# Patient Record
Sex: Male | Born: 1935 | Race: White | Hispanic: No | Marital: Married | Smoking: Former smoker
Health system: Southern US, Community
[De-identification: ages and names within clinical notes are randomized; demographics above are authoritative.]

## PROBLEM LIST (undated history)

## (undated) DIAGNOSIS — R51 Headache: Secondary | ICD-10-CM

## (undated) DIAGNOSIS — J449 Chronic obstructive pulmonary disease, unspecified: Secondary | ICD-10-CM

## (undated) DIAGNOSIS — E119 Type 2 diabetes mellitus without complications: Secondary | ICD-10-CM

## (undated) DIAGNOSIS — J189 Pneumonia, unspecified organism: Secondary | ICD-10-CM

## (undated) DIAGNOSIS — Z87442 Personal history of urinary calculi: Secondary | ICD-10-CM

## (undated) DIAGNOSIS — D649 Anemia, unspecified: Secondary | ICD-10-CM

## (undated) DIAGNOSIS — H9190 Unspecified hearing loss, unspecified ear: Secondary | ICD-10-CM

## (undated) DIAGNOSIS — I10 Essential (primary) hypertension: Secondary | ICD-10-CM

## (undated) DIAGNOSIS — M1990 Unspecified osteoarthritis, unspecified site: Secondary | ICD-10-CM

## (undated) DIAGNOSIS — R519 Headache, unspecified: Secondary | ICD-10-CM

## (undated) DIAGNOSIS — E7800 Pure hypercholesterolemia, unspecified: Secondary | ICD-10-CM

## (undated) HISTORY — PX: FRACTURE SURGERY: SHX138

## (undated) HISTORY — PX: ANKLE FRACTURE SURGERY: SHX122

## (undated) HISTORY — PX: CATARACT EXTRACTION: SUR2

---

## 1998-08-24 ENCOUNTER — Ambulatory Visit (HOSPITAL_COMMUNITY): Admission: RE | Admit: 1998-08-24 | Discharge: 1998-08-24 | Payer: Self-pay | Admitting: *Deleted

## 1998-09-22 ENCOUNTER — Emergency Department (HOSPITAL_COMMUNITY): Admission: EM | Admit: 1998-09-22 | Discharge: 1998-09-22 | Payer: Self-pay | Admitting: Emergency Medicine

## 2001-11-24 ENCOUNTER — Encounter: Payer: Self-pay | Admitting: Emergency Medicine

## 2001-11-24 ENCOUNTER — Emergency Department (HOSPITAL_COMMUNITY): Admission: EM | Admit: 2001-11-24 | Discharge: 2001-11-24 | Payer: Self-pay | Admitting: Emergency Medicine

## 2004-07-26 ENCOUNTER — Encounter: Admission: RE | Admit: 2004-07-26 | Discharge: 2004-07-26 | Payer: Self-pay | Admitting: Cardiology

## 2004-08-24 ENCOUNTER — Ambulatory Visit (HOSPITAL_COMMUNITY): Admission: RE | Admit: 2004-08-24 | Discharge: 2004-08-24 | Payer: Self-pay | Admitting: Cardiology

## 2005-09-10 ENCOUNTER — Emergency Department (HOSPITAL_COMMUNITY): Admission: EM | Admit: 2005-09-10 | Discharge: 2005-09-11 | Payer: Self-pay | Admitting: Emergency Medicine

## 2005-10-03 ENCOUNTER — Encounter: Admission: RE | Admit: 2005-10-03 | Discharge: 2005-10-03 | Payer: Self-pay | Admitting: Gastroenterology

## 2006-04-10 ENCOUNTER — Encounter: Payer: Self-pay | Admitting: Emergency Medicine

## 2008-02-03 ENCOUNTER — Encounter: Admission: RE | Admit: 2008-02-03 | Discharge: 2008-02-23 | Payer: Self-pay | Admitting: Orthopedic Surgery

## 2011-04-27 NOTE — Cardiovascular Report (Signed)
NAME:  Joshua [name], Joshua [name] NO.:  [id number]   MEDICAL RECORD NO.:  [id number]                   PATIENT TYPE:  OIB   LOCATION:  2899                                 FACILITY:  [facility]   PHYSICIAN:  W. Ashley Royalty., M.D.         DATE OF BIRTH:  May 02, 1936   DATE OF PROCEDURE:  08/24/2004  DATE OF DISCHARGE:                              CARDIAC CATHETERIZATION   HISTORY:  The patient is a 74 year old male who developed diaphoresis and  sweating and shortness of breath and had a Cardiolite test showing a small  focused anterolateral ischemia.  He is brought in at this time for  diagnostic catheterization.   PROCEDURE:  Left heart catheterization with coronary angiogram and left  ventriculogram.   COMMENTS ABOUT PROCEDURE:  The patient tolerated the procedure well.  Following the procedure, he had good hemostasis and peripheral pulses noted.   HEMODYNAMIC DATA:  1.  Aorta postcontrast:  149/71.  2.  LV postcontrast:  149/11-21.   ANGIOGRAPHIC DATA:   LEFT VENTRICULOGRAM:  Performed in the 30 degree RAO projection.  The aortic  valve was normal.  The mitral valve appeared normal.  There was trace to  mild mitral regurgitation that may by catheter induced.  The estimated  ejection fraction is 60%.  There is no wall motion abnormality.  Coronary  arteries arise and distribute normally.  Left main coronary artery is  normal.   Left anterior descending:  Large vessel that extends around the apex.  Supplies a diagonal branch.  Scattered irregularities are present, but no  significant stenoses are noted.   Circumflex coronary artery:  Scattered irregularities.   Right coronary artery:  Supplies the posterior descending and a posterior  lateral branch and is free of disease.   IMPRESSION:  1.  Normal left ventricular function.  2.  Scattered irregularities, but no significant obstructive coronary artery      disease noted.  3.  Trace to mild mitral  regurgitation that may be catheter induced.                                               Darden Palmer., M.D.    WST/MEDQ  D:  08/24/2004  T:  08/24/2004  Job:  [id number]   cc:   Ralene Ok, M.D.  [address]  [place]  [place] [address]  Fax: [phone]   Aida Puffer  [address]  [place]  [place] [address]  Fax: [phone]

## 2012-07-14 ENCOUNTER — Other Ambulatory Visit: Payer: Self-pay | Admitting: Orthopedic Surgery

## 2012-07-14 ENCOUNTER — Ambulatory Visit
Admission: RE | Admit: 2012-07-14 | Discharge: 2012-07-14 | Disposition: A | Discharge status: Home / Self Care | Payer: Medicare Other | Source: Ambulatory Visit | Attending: Orthopedic Surgery | Admitting: Orthopedic Surgery

## 2012-07-14 DIAGNOSIS — IMO0002 Reserved for concepts with insufficient information to code with codable children: Secondary | ICD-10-CM

## 2014-07-02 ENCOUNTER — Other Ambulatory Visit: Payer: Self-pay | Admitting: Family Medicine

## 2014-07-02 DIAGNOSIS — Z87891 Personal history of nicotine dependence: Secondary | ICD-10-CM

## 2014-07-08 ENCOUNTER — Other Ambulatory Visit: Payer: Medicare Other

## 2014-07-13 ENCOUNTER — Ambulatory Visit
Admission: RE | Admit: 2014-07-13 | Discharge: 2014-07-13 | Disposition: A | Discharge status: Home / Self Care | Payer: Medicare Other | Source: Ambulatory Visit | Attending: Family Medicine | Admitting: Family Medicine

## 2014-07-13 DIAGNOSIS — Z87891 Personal history of nicotine dependence: Secondary | ICD-10-CM

## 2016-11-29 ENCOUNTER — Ambulatory Visit (INDEPENDENT_AMBULATORY_CARE_PROVIDER_SITE_OTHER): Payer: Medicare Other | Admitting: Orthopedic Surgery

## 2016-11-29 ENCOUNTER — Ambulatory Visit (INDEPENDENT_AMBULATORY_CARE_PROVIDER_SITE_OTHER): Payer: Medicare Other

## 2016-11-29 ENCOUNTER — Encounter (INDEPENDENT_AMBULATORY_CARE_PROVIDER_SITE_OTHER): Payer: Self-pay | Admitting: Orthopedic Surgery

## 2016-11-29 VITALS — Ht 72.0 in | Wt 185.0 lb

## 2016-11-29 DIAGNOSIS — M25561 Pain in right knee: Secondary | ICD-10-CM

## 2016-11-29 DIAGNOSIS — M170 Bilateral primary osteoarthritis of knee: Secondary | ICD-10-CM

## 2016-11-29 DIAGNOSIS — G8929 Other chronic pain: Secondary | ICD-10-CM

## 2016-11-29 DIAGNOSIS — M25562 Pain in left knee: Secondary | ICD-10-CM | POA: Diagnosis not present

## 2016-11-29 MED ORDER — LIDOCAINE HCL 1 % IJ SOLN
5.0000 mL | INTRAMUSCULAR | Status: AC | PRN
  Administered 2016-11-29: 5 mL

## 2016-11-29 MED ORDER — METHYLPREDNISOLONE ACETATE 40 MG/ML IJ SUSP
40.0000 mg | INTRAMUSCULAR | Status: AC | PRN
  Administered 2016-11-29: 40 mg via INTRA_ARTICULAR

## 2016-11-29 NOTE — Progress Notes (Signed)
Office Visit Note   Patient: Joshua MannanLarry D Shima           Date of Birth: 1936-04-30           MRN: [id number] Visit Date: 11/29/2016              Requested by: No referring provider defined for this encounter. PCP: No primary care provider on file.   Assessment & Plan: Visit Diagnoses:  1. Bilateral primary osteoarthritis of knee   2. Chronic pain of left knee   3. Chronic pain of right knee     Plan: Right knee was injected. Discussed the due to failure of conservative care we would recommend proceeding with total knee arthroplasty in the right preoperative postoperative protocol was discussed. Patient states he understands he will discuss this with his family.  Follow-Up Instructions: Return if symptoms worsen or fail to improve.   Orders:  Orders Placed This Encounter  Procedures  . XR Knee 1-2 Views Right  . XR Knee 1-2 Views Left   No orders of the defined types were placed in this encounter.     Procedures: Large Joint Inj Date/Time: 11/29/2016 2:14 PM Performed by: Alyza Artiaga V Authorized by: Nadara MustardUDA, Tyeshia Cornforth V   Consent Given by:  Patient Site marked: the procedure site was marked   Timeout: prior to procedure the correct patient, procedure, and site was verified   Indications:  Pain and diagnostic evaluation Location:  Knee Site:  R knee Prep: patient was prepped and draped in usual sterile fashion   Needle Size:  22 G Needle Length:  1.5 inches Approach:  Anteromedial Ultrasound Guidance: No   Fluoroscopic Guidance: No   Arthrogram: No   Medications:  5 mL lidocaine 1 %; 40 mg methylPREDNISolone acetate 40 MG/ML Aspiration Attempted: No   Patient tolerance:  Patient tolerated the procedure well with no immediate complications     Clinical Data: No additional findings.   Subjective: Chief Complaint  Patient presents with  . Left Knee - Pain  . Right Knee - Pain    Bilateral knee pain. Medial side pain pt does not note any swelling. Knees have  been painful for the past 15 years per the pt's wife. He does not want to have surgery. He has been to flexogenics x 5 and it was only helpful for a few days. The pt takes tramadol, goodies power, mobic prn pain. "nothing helps" Painful ambulation.    Review of Systems of note patient is been to flex a generous without relief he is currently on motor. He states he quit smoking 5 months ago with a lifetime history of tobacco use.   Objective: Vital Signs: Ht 6' (1.829 m)   Wt 185 lb (83.9 kg)   BMI 25.09 kg/m   Physical Exam patient is alert oriented no adenopathy well-dressed normal affect  respiratory effort with forced expiratory breathing. he does have an antalgic gait with varus alignment worse on the right than the left. Examination patient is crepitation range of motion both knees Clausen cruciate are stable.  Shows severe tricompartmental arthritic changes.  Ortho Exam  Specialty Comments:  No specialty comments available.  Imaging: Xr Knee 1-2 Views Left  Result Date: 11/29/2016 Two-view radiographs of the left knee shows bone-on-bone contact of all 3 compartments periarticular bony spurs with calcification of the popliteal vessels.  Xr Knee 1-2 Views Right  Result Date: 11/29/2016 Two-view radiographs of the left knee shows tricompartmental arthritic changes bone-on-bone contact periarticular bony  spurs with calcification of the popliteal vessels. Patient has varus alignment of the right knee worse than the left.    PMFS History: There are no active problems to display for this patient.  No past medical history on file.  No family history on file.  No past surgical history on file. Social History   Occupational History  . Not on file.   Social History Main Topics  . Smoking status: Current Every Day Smoker  . Smokeless tobacco: Never Used  . Alcohol use No  . Drug use: Unknown  . Sexual activity: Not on file

## 2017-01-28 ENCOUNTER — Encounter (HOSPITAL_COMMUNITY): Payer: Self-pay

## 2017-01-28 ENCOUNTER — Emergency Department (HOSPITAL_COMMUNITY): Payer: Medicare Other

## 2017-01-28 ENCOUNTER — Emergency Department (HOSPITAL_COMMUNITY)
Admission: EM | Admit: 2017-01-28 | Discharge: 2017-01-28 | Disposition: A | Discharge status: Home / Self Care | Payer: Medicare Other | Attending: Emergency Medicine | Admitting: Emergency Medicine

## 2017-01-28 DIAGNOSIS — E119 Type 2 diabetes mellitus without complications: Secondary | ICD-10-CM | POA: Insufficient documentation

## 2017-01-28 DIAGNOSIS — Z7984 Long term (current) use of oral hypoglycemic drugs: Secondary | ICD-10-CM | POA: Diagnosis not present

## 2017-01-28 DIAGNOSIS — I10 Essential (primary) hypertension: Secondary | ICD-10-CM | POA: Insufficient documentation

## 2017-01-28 DIAGNOSIS — R1084 Generalized abdominal pain: Secondary | ICD-10-CM | POA: Diagnosis not present

## 2017-01-28 DIAGNOSIS — R112 Nausea with vomiting, unspecified: Secondary | ICD-10-CM | POA: Diagnosis present

## 2017-01-28 DIAGNOSIS — Z87891 Personal history of nicotine dependence: Secondary | ICD-10-CM | POA: Diagnosis not present

## 2017-01-28 DIAGNOSIS — R197 Diarrhea, unspecified: Secondary | ICD-10-CM | POA: Diagnosis not present

## 2017-01-28 DIAGNOSIS — Z7982 Long term (current) use of aspirin: Secondary | ICD-10-CM | POA: Insufficient documentation

## 2017-01-28 DIAGNOSIS — Z79899 Other long term (current) drug therapy: Secondary | ICD-10-CM | POA: Diagnosis not present

## 2017-01-28 HISTORY — DX: Pure hypercholesterolemia, unspecified: E78.00

## 2017-01-28 HISTORY — DX: Essential (primary) hypertension: I10

## 2017-01-28 HISTORY — DX: Type 2 diabetes mellitus without complications: E11.9

## 2017-01-28 LAB — COMPREHENSIVE METABOLIC PANEL
ALT: 18 U/L (ref 17–63)
AST: 17 U/L (ref 15–41)
Albumin: 3.4 g/dL — ABNORMAL LOW (ref 3.5–5.0)
Alkaline Phosphatase: 64 U/L (ref 38–126)
Anion gap: 11 (ref 5–15)
BUN: 21 mg/dL — ABNORMAL HIGH (ref 6–20)
CO2: 21 mmol/L — AB (ref 22–32)
CREATININE: 1.26 mg/dL — AB (ref 0.61–1.24)
Calcium: 9.1 mg/dL (ref 8.9–10.3)
Chloride: 109 mmol/L (ref 101–111)
GFR calc Af Amer: >60 mL/min (ref >60)
GFR calc non Af Amer: 53 mL/min — ABNORMAL LOW (ref >60)
Glucose, Bld: 184 mg/dL — ABNORMAL HIGH (ref 65–99)
POTASSIUM: 4.2 mmol/L (ref 3.5–5.1)
Sodium: 141 mmol/L (ref 135–145)
Total Bilirubin: 0.6 mg/dL (ref 0.3–1.2)
Total Protein: 6.4 g/dL — ABNORMAL LOW (ref 6.5–8.1)

## 2017-01-28 LAB — CBC
HCT: 41.8 % (ref 39.0–52.0)
HEMOGLOBIN: 13.9 g/dL (ref 13.0–17.0)
MCH: 31.3 pg (ref 26.0–34.0)
MCHC: 33.3 g/dL (ref 30.0–36.0)
MCV: 94.1 fL (ref 78.0–100.0)
PLATELETS: 243 10*3/uL (ref 150–400)
RBC: 4.44 MIL/uL (ref 4.22–5.81)
RDW: 15 % (ref 11.5–15.5)
WBC: 9.9 10*3/uL (ref 4.0–10.5)

## 2017-01-28 LAB — TYPE AND SCREEN
ABO/RH(D): A NEG
Antibody Screen: POSITIVE
DAT, IGG: NEGATIVE

## 2017-01-28 MED ORDER — OMEPRAZOLE 20 MG PO CPDR: 20.0000 mg | DELAYED_RELEASE_CAPSULE | Freq: Two times a day (BID) | ORAL | 0 refills | Status: DC

## 2017-01-28 MED ORDER — PANTOPRAZOLE SODIUM 40 MG IV SOLR
40.0000 mg | Freq: Once | INTRAVENOUS | Status: AC
Start: 2017-01-28 — End: 2017-01-28
  Administered 2017-01-28: 40 mg via INTRAVENOUS
  Filled 2017-01-28: qty 40

## 2017-01-28 MED ORDER — ONDANSETRON 4 MG PO TBDP: 4.0000 mg | ORAL_TABLET | Freq: Three times a day (TID) | ORAL | 0 refills | Status: DC | PRN

## 2017-01-28 MED ORDER — IOPAMIDOL (ISOVUE-300) INJECTION 61%
INTRAVENOUS | Status: AC
  Administered 2017-01-28: 75 mL
  Filled 2017-01-28: qty 100

## 2017-01-28 NOTE — ED Notes (Signed)
Patient verbalized understanding of discharge instructions and denies any further needs or questions at this time. VS stable. Patient ambulatory with steady gait. Escorted to ED entrance in wheelchair.   

## 2017-01-28 NOTE — ED Notes (Signed)
Blood Bank called and stated pt has rare antibody, so if he is to have a blood transfusion, it would take an hour to prepare blood.

## 2017-01-28 NOTE — ED Notes (Signed)
MD at bedside. 

## 2017-01-28 NOTE — ED Provider Notes (Signed)
7:45 PM patient asymptomatic alert Glasgow Coma Score 15 able to drink water without difficulty. Results for orders placed or performed during the hospital encounter of 01/28/17  Comprehensive metabolic panel  Result Value Ref Range   Sodium 141 135 - 145 mmol/L   Potassium 4.2 3.5 - 5.1 mmol/L   Chloride 109 101 - 111 mmol/L   CO2 21 (L) 22 - 32 mmol/L   Glucose, Bld 184 (H) 65 - 99 mg/dL   BUN 21 (H) 6 - 20 mg/dL   Creatinine, Ser 1.611.26 (H) 0.61 - 1.24 mg/dL   Calcium 9.1 8.9 - 09.610.3 mg/dL   Total Protein 6.4 (L) 6.5 - 8.1 g/dL   Albumin 3.4 (L) 3.5 - 5.0 g/dL   AST 17 15 - 41 U/L   ALT 18 17 - 63 U/L   Alkaline Phosphatase 64 38 - 126 U/L   Total Bilirubin 0.6 0.3 - 1.2 mg/dL   GFR calc non Af Amer 53 (L) >60 mL/min   GFR calc Af Amer >60 >60 mL/min   Anion gap 11 5 - 15  CBC  Result Value Ref Range   WBC 9.9 4.0 - 10.5 K/uL   RBC 4.44 4.22 - 5.81 MIL/uL   Hemoglobin 13.9 13.0 - 17.0 g/dL   HCT 04.541.8 40.939.0 - 81.152.0 %   MCV 94.1 78.0 - 100.0 fL   MCH 31.3 26.0 - 34.0 pg   MCHC 33.3 30.0 - 36.0 g/dL   RDW 91.415.0 78.211.5 - 95.615.5 %   Platelets 243 150 - 400 K/uL  Type and screen [facility]  Result Value Ref Range   ABO/RH(D) A NEG    Antibody Screen POS    Sample Expiration 01/31/2017    Antibody Identification ANTI D    DAT, IgG NEG    Ct Abdomen Pelvis W Contrast  Result Date: 01/28/2017 CLINICAL DATA:  Mid abdominal and back pain for 2 days. Coffee ground emesis and tarry stools. EXAM: CT ABDOMEN AND PELVIS WITH CONTRAST TECHNIQUE: Multidetector CT imaging of the abdomen and pelvis was performed using the standard protocol following bolus administration of intravenous contrast. CONTRAST:  75 ml ISOVUE-300 IOPAMIDOL (ISOVUE-300) INJECTION 61% COMPARISON:  CT chest 07/13/2014. FINDINGS: Lower chest: Calcific aortic and coronary atherosclerosis is identified. No pleural or pericardial effusion. Basilar atelectasis is worse on the left. Hepatobiliary: A 0.5 cm  hypoattenuating lesion in the right hepatic lobe is likely a cyst. The liver otherwise appears normal. The gallbladder and biliary tree are unremarkable. Pancreas: Unremarkable. No pancreatic ductal dilatation or surrounding inflammatory changes. Spleen: Normal in size without focal abnormality. Adrenals/Urinary Tract: Lobulated kidneys are noted. Multiple bilateral renal cysts are seen. Two nonobstructing stones are seen in the right kidney. The larger measures 0.9 cm. Calcifications in the left kidney are likely vascular. Ureters and urinary bladder appear normal. The adrenal glands are unremarkable. Stomach/Bowel: Sigmoid diverticulosis is noted. The colon otherwise appears normal. The stomach, small bowel and appendix appear normal. Vascular/Lymphatic: Extensive aortoiliac atherosclerosis without aneurysm is identified. Circumaortic left renal vein is noted. No lymphadenopathy. Reproductive: Prostate is unremarkable. Other: No fluid collection. Small fat containing umbilical hernia is noted. Musculoskeletal: No fracture worrisome lesion. Lower lumbar degenerative changes seen. IMPRESSION: No acute abnormality abdomen or pelvis. No finding to explain the patient's symptoms. Extensive calcific coronary and aortic atherosclerosis. Nonobstructing right renal stones. Sigmoid diverticulosis without diverticulitis. Electronically Signed   By: Drusilla Kannerhomas  Dalessio M.D.   On: 01/28/2017 18:57     Doug SouSam Barbara Keng, MD  01/28/17 2013  

## 2017-01-28 NOTE — ED Notes (Signed)
Patient transported to CT 

## 2017-01-28 NOTE — ED Notes (Signed)
Per Dr. Fayrene FearingJames, hemoccult is NEGATIVE.

## 2017-01-28 NOTE — ED Provider Notes (Signed)
[facility]-EMERGENCY DEPT Provider Note   CSN: [id number] Arrival date & time: 01/28/17  1223     History   Chief Complaint Chief Complaint  Patient presents with  . Abdominal Pain- GI bleeding  . Emesis    HPI Joshua [name] is a 81 y.o. male. Chief complaint is vomiting and diarrhea.  HPI:  Patient presents for evaluation of vomiting and diarrhea for the last 2-3 days. He had some "darker stool". Not frankly black or melanotic. Display no blood. Has not had fevers. Some generalized discomfort without localization his abdomen. At episode of emesis today this morning. States it was dark. Not bloody. No history of GI bleeding. He does take daily meloxicam for joint pain and has for several years. Also takes [place] powder several per day.  Is not lightheaded or dizzy. No CP or presyncope. Is not anticoagulated.  Past Medical History:  Diagnosis Date  . Diabetes mellitus without complication (HCC)   . High cholesterol   . Hypertension     There are no active problems to display for this patient.   History reviewed. No pertinent surgical history.     Home Medications    Prior to Admission medications   Medication Sig Start Date End Date Taking? Authorizing Provider  allopurinol (ZYLOPRIM) 300 MG tablet Take 300 mg by mouth 2 (two) times daily. 10/08/16  Yes Historical Provider, MD  amLODipine (NORVASC) 10 MG tablet Take 10 mg by mouth daily. 09/26/16  Yes Historical Provider, MD  Aspirin-Acetaminophen-Caffeine (GOODYS EXTRA STRENGTH) [phone] MG PACK Take 1 packet by mouth 3 (three) times daily.   Yes Historical Provider, MD  lisinopril (PRINIVIL,ZESTRIL) 40 MG tablet Take 40 mg by mouth daily. 11/20/16  Yes Historical Provider, MD  lovastatin (MEVACOR) 20 MG tablet Take 20 mg by mouth daily. 09/26/16  Yes Historical Provider, MD  meloxicam (MOBIC) 7.5 MG tablet Take 7.5 mg by mouth 2 (two) times daily.   Yes Historical Provider, MD  metFORMIN (GLUCOPHAGE) 500 MG tablet Take  500 mg by mouth 2 (two) times daily with breakfast and lunch. 09/07/16  Yes Historical Provider, MD  metoprolol (LOPRESSOR) 100 MG tablet Take 100 mg by mouth daily.   Yes Historical Provider, MD  traMADol (ULTRAM) 50 MG tablet Take 50 mg by mouth as needed. 09/26/16  Yes Historical Provider, MD  omeprazole (PRILOSEC) 20 MG capsule Take 1 capsule (20 mg total) by mouth 2 (two) times daily. 01/28/17   Rolland Porter, MD  ondansetron (ZOFRAN ODT) 4 MG disintegrating tablet Take 1 tablet (4 mg total) by mouth every 8 (eight) hours as needed for nausea. 01/28/17   Rolland Porter, MD    Family History No family history on file.  Social History Social History  Substance Use Topics  . Smoking status: Former Games developer  . Smokeless tobacco: Never Used  . Alcohol use No     Allergies   Patient has no known allergies.   Review of Systems Review of Systems  Constitutional: Negative for appetite change, chills, diaphoresis, fatigue and fever.  HENT: Negative for mouth sores, sore throat and trouble swallowing.   Eyes: Negative for visual disturbance.  Respiratory: Negative for cough, chest tightness, shortness of breath and wheezing.   Cardiovascular: Negative for chest pain.  Gastrointestinal: Positive for abdominal distention, diarrhea, nausea and vomiting. Negative for abdominal pain.  Endocrine: Negative for polydipsia, polyphagia and polyuria.  Genitourinary: Negative for dysuria, frequency and hematuria.  Musculoskeletal: Negative for gait problem.  Skin: Negative for color change, pallor  and rash.  Neurological: Negative for dizziness, syncope, light-headedness and headaches.  Hematological: Does not bruise/bleed easily.  Psychiatric/Behavioral: Negative for behavioral problems and confusion.     Physical Exam Updated Vital Signs BP 159/72   Pulse 87   Temp 98.7 F (37.1 C) (Oral)   Resp 24   Ht 6' (1.829 m)   Wt 185 lb (83.9 kg)   SpO2 98%   BMI 25.09 kg/m   Physical Exam    Constitutional: He is oriented to person, place, and time. He appears well-developed and well-nourished. No distress.  HENT:  Head: Normocephalic.  Eyes: Conjunctivae are normal. Pupils are equal, round, and reactive to light. No scleral icterus.  Conjunctiva are not pale.  Neck: Normal range of motion. Neck supple. No thyromegaly present.  Cardiovascular: Normal rate and regular rhythm.  Exam reveals no gallop and no friction rub.   No murmur heard. Pulmonary/Chest: Effort normal and breath sounds normal. No respiratory distress. He has no wheezes. He has no rales.  Abdominal: Soft. Bowel sounds are normal. He exhibits no distension. There is no tenderness. There is no rebound.  Localized tenderness in the abdomen. Complains of some periumbilical tenderness but this is diffuse and not reproducible. Display no particular irritation or rigidity. Rectal was dark stool guaiac negative.  Musculoskeletal: Normal range of motion.  Neurological: He is alert and oriented to person, place, and time.  Skin: Skin is warm and dry. No rash noted.  Psychiatric: He has a normal mood and affect. His behavior is normal.     ED Treatments / Results  Labs (all labs ordered are listed, but only abnormal results are displayed) Labs Reviewed  COMPREHENSIVE METABOLIC PANEL - Abnormal; Notable for the following:       Result Value   CO2 21 (*)    Glucose, Bld 184 (*)    BUN 21 (*)    Creatinine, Ser 1.26 (*)    Total Protein 6.4 (*)    Albumin 3.4 (*)    GFR calc non Af Amer 53 (*)    All other components within normal limits  CBC  POC OCCULT BLOOD, ED  TYPE AND SCREEN    EKG  EKG Interpretation None       Radiology Ct Abdomen Pelvis W Contrast  Result Date: 01/28/2017 CLINICAL DATA:  Mid abdominal and back pain for 2 days. Coffee ground emesis and tarry stools. EXAM: CT ABDOMEN AND PELVIS WITH CONTRAST TECHNIQUE: Multidetector CT imaging of the abdomen and pelvis was performed using the  standard protocol following bolus administration of intravenous contrast. CONTRAST:  75 ml ISOVUE-300 IOPAMIDOL (ISOVUE-300) INJECTION 61% COMPARISON:  CT chest 07/13/2014. FINDINGS: Lower chest: Calcific aortic and coronary atherosclerosis is identified. No pleural or pericardial effusion. Basilar atelectasis is worse on the left. Hepatobiliary: A 0.5 cm hypoattenuating lesion in the right hepatic lobe is likely a cyst. The liver otherwise appears normal. The gallbladder and biliary tree are unremarkable. Pancreas: Unremarkable. No pancreatic ductal dilatation or surrounding inflammatory changes. Spleen: Normal in size without focal abnormality. Adrenals/Urinary Tract: Lobulated kidneys are noted. Multiple bilateral renal cysts are seen. Two nonobstructing stones are seen in the right kidney. The larger measures 0.9 cm. Calcifications in the left kidney are likely vascular. Ureters and urinary bladder appear normal. The adrenal glands are unremarkable. Stomach/Bowel: Sigmoid diverticulosis is noted. The colon otherwise appears normal. The stomach, small bowel and appendix appear normal. Vascular/Lymphatic: Extensive aortoiliac atherosclerosis without aneurysm is identified. Circumaortic left renal vein is noted. No  lymphadenopathy. Reproductive: Prostate is unremarkable. Other: No fluid collection. Small fat containing umbilical hernia is noted. Musculoskeletal: No fracture worrisome lesion. Lower lumbar degenerative changes seen. IMPRESSION: No acute abnormality abdomen or pelvis. No finding to explain the patient's symptoms. Extensive calcific coronary and aortic atherosclerosis. Nonobstructing right renal stones. Sigmoid diverticulosis without diverticulitis. Electronically Signed   By: Drusilla Kannerhomas  Dalessio M.D.   On: 01/28/2017 18:57    Procedures Procedures (including critical care time)  Medications Ordered in ED Medications  pantoprazole (PROTONIX) injection 40 mg (40 mg Intravenous Given 01/28/17 1547)    iopamidol (ISOVUE-300) 61 % injection (75 mLs  Contrast Given 01/28/17 1813)     Initial Impression / Assessment and Plan / ED Course  I have reviewed the triage vital signs and the nursing notes.  Pertinent labs & imaging results that were available during my care of the patient were reviewed by me and considered in my medical decision making (see chart for details).    If CT negative, can Dc with PPI and DC Meloxicam and goody's Powder.   Final Clinical Impressions(s) / ED Diagnoses   Final diagnoses:  Nausea vomiting and diarrhea    New Prescriptions Discharge Medication List as of 01/28/2017  4:46 PM    START taking these medications   Details  omeprazole (PRILOSEC) 20 MG capsule Take 1 capsule (20 mg total) by mouth 2 (two) times daily., Starting Mon 01/28/2017, Print    ondansetron (ZOFRAN ODT) 4 MG disintegrating tablet Take 1 tablet (4 mg total) by mouth every 8 (eight) hours as needed for nausea., Starting Mon 01/28/2017, Print         Rolland PorterMark Bleu Minerd, MD 01/30/17 [phone]

## 2017-01-28 NOTE — ED Notes (Signed)
Patient tolerating water well, denies N/V.

## 2017-01-28 NOTE — ED Notes (Signed)
Took ice water to patient per Dr. Ethelda ChickJacubowitz

## 2017-01-28 NOTE — ED Triage Notes (Signed)
Patient complains of several days of generalized abdominal pain and cramping with vomiting dark coffee ground emesis and black stools. Reports that the pain is intermittent, no vomiting on arrival. Alert and oriented

## 2017-03-27 ENCOUNTER — Other Ambulatory Visit: Payer: Self-pay | Admitting: Family Medicine

## 2017-03-27 DIAGNOSIS — R1900 Intra-abdominal and pelvic swelling, mass and lump, unspecified site: Secondary | ICD-10-CM

## 2017-04-09 ENCOUNTER — Ambulatory Visit
Admission: RE | Admit: 2017-04-09 | Discharge: 2017-04-09 | Disposition: A | Discharge status: Home / Self Care | Payer: Medicare Other | Source: Ambulatory Visit | Attending: Family Medicine | Admitting: Family Medicine

## 2017-04-09 ENCOUNTER — Other Ambulatory Visit: Payer: Self-pay | Admitting: Family Medicine

## 2017-04-09 DIAGNOSIS — R159 Full incontinence of feces: Secondary | ICD-10-CM

## 2017-04-09 DIAGNOSIS — M5489 Other dorsalgia: Secondary | ICD-10-CM

## 2017-04-09 DIAGNOSIS — R1900 Intra-abdominal and pelvic swelling, mass and lump, unspecified site: Secondary | ICD-10-CM

## 2017-04-23 ENCOUNTER — Ambulatory Visit
Admission: RE | Admit: 2017-04-23 | Discharge: 2017-04-23 | Disposition: A | Discharge status: Home / Self Care | Payer: Medicare Other | Source: Ambulatory Visit | Attending: Family Medicine | Admitting: Family Medicine

## 2017-04-23 ENCOUNTER — Other Ambulatory Visit: Payer: Self-pay | Admitting: Family Medicine

## 2017-04-23 DIAGNOSIS — R599 Enlarged lymph nodes, unspecified: Secondary | ICD-10-CM

## 2017-04-23 DIAGNOSIS — R590 Localized enlarged lymph nodes: Secondary | ICD-10-CM

## 2017-04-23 DIAGNOSIS — R591 Generalized enlarged lymph nodes: Secondary | ICD-10-CM

## 2017-04-24 ENCOUNTER — Emergency Department (HOSPITAL_BASED_OUTPATIENT_CLINIC_OR_DEPARTMENT_OTHER)
Admission: EM | Admit: 2017-04-24 | Discharge: 2017-04-24 | Disposition: A | Discharge status: Home / Self Care | Payer: Medicare Other | Attending: Physician Assistant | Admitting: Physician Assistant

## 2017-04-24 ENCOUNTER — Encounter (HOSPITAL_BASED_OUTPATIENT_CLINIC_OR_DEPARTMENT_OTHER): Payer: Self-pay | Admitting: *Deleted

## 2017-04-24 ENCOUNTER — Emergency Department (HOSPITAL_BASED_OUTPATIENT_CLINIC_OR_DEPARTMENT_OTHER): Payer: Medicare Other

## 2017-04-24 DIAGNOSIS — Y929 Unspecified place or not applicable: Secondary | ICD-10-CM | POA: Diagnosis not present

## 2017-04-24 DIAGNOSIS — Y939 Activity, unspecified: Secondary | ICD-10-CM | POA: Diagnosis not present

## 2017-04-24 DIAGNOSIS — X500XXA Overexertion from strenuous movement or load, initial encounter: Secondary | ICD-10-CM | POA: Insufficient documentation

## 2017-04-24 DIAGNOSIS — E119 Type 2 diabetes mellitus without complications: Secondary | ICD-10-CM | POA: Insufficient documentation

## 2017-04-24 DIAGNOSIS — S46001A Unspecified injury of muscle(s) and tendon(s) of the rotator cuff of right shoulder, initial encounter: Secondary | ICD-10-CM | POA: Insufficient documentation

## 2017-04-24 DIAGNOSIS — Y999 Unspecified external cause status: Secondary | ICD-10-CM | POA: Insufficient documentation

## 2017-04-24 DIAGNOSIS — Z87891 Personal history of nicotine dependence: Secondary | ICD-10-CM | POA: Diagnosis not present

## 2017-04-24 DIAGNOSIS — I10 Essential (primary) hypertension: Secondary | ICD-10-CM | POA: Insufficient documentation

## 2017-04-24 DIAGNOSIS — Z7984 Long term (current) use of oral hypoglycemic drugs: Secondary | ICD-10-CM | POA: Diagnosis not present

## 2017-04-24 DIAGNOSIS — S4991XA Unspecified injury of right shoulder and upper arm, initial encounter: Secondary | ICD-10-CM | POA: Diagnosis present

## 2017-04-24 MED ORDER — OXYCODONE-ACETAMINOPHEN 5-325 MG PO TABS
1.0000 | ORAL_TABLET | Freq: Once | ORAL | Status: AC
  Administered 2017-04-24: 1 via ORAL
  Filled 2017-04-24: qty 1

## 2017-04-24 MED ORDER — OXYCODONE-ACETAMINOPHEN 5-325 MG PO TABS: 1.0000 | ORAL_TABLET | Freq: Four times a day (QID) | ORAL | 0 refills | Status: DC | PRN

## 2017-04-24 NOTE — ED Triage Notes (Signed)
Pts family reports that his right arm has been hurting x 2-3 weeks since using a leaf blower, states that yesterday he was outside trimming and has had terrible pain since that time.  Unable to raise arm without pain.  Had xrays done yesterday.

## 2017-04-24 NOTE — ED Provider Notes (Signed)
[facility] Provider Note   CSN: [id number] Arrival date & time: 04/24/17  1747  By signing my name below, I, Cynda Acres, attest that this documentation has been prepared under the direction and in the presence of Welton Bord, PA-C. Electronically Signed: Cynda Acres, Scribe. 04/24/17. 6:58 PM.  History   Chief Complaint Chief Complaint  Patient presents with  . Arm Pain   HPI Comments: Joshua [name] is a 81 y.o. male with a history of DM, HTN, and HLD, who presents to the Emergency Department complaining of sudden-onset, constant right arm pain that began 4-5 days ago. Patient reports using a weed eater and picking up items over the last few days, which is consistent with his usual activities. He states the left arm is painful from the shoulder to the fingertips. Patient denies any injury or falls.  Patient states his arm pain feels as if it is "going to sleep". According to daughter bedside he was complaining of arm pain to the point he was unable to pick up food with a fork to eat. Patient states his pain is worse when raising his right arm. Pain is better with certain positions, including cradling the arm against his body. Patient reports developing a spur in his right shoulder in the past. Some relief with prescribed tramadol. Patient reports a history of tobacco use, quit 10 months ago. Patient denies any chest pain, left arm pain, HA, fever, chills, numbness, or weakness.   The history is provided by the patient. No language interpreter was used.    Past Medical History:  Diagnosis Date  . Diabetes mellitus without complication (HCC)   . High cholesterol   . Hypertension     There are no active problems to display for this patient.   History reviewed. No pertinent surgical history.     Home Medications    Prior to Admission medications   Medication Sig Start Date End Date Taking? Authorizing Provider  allopurinol (ZYLOPRIM) 300 MG tablet Take 300 mg  by mouth 2 (two) times daily. 10/08/16   [provider]  amLODipine (NORVASC) 10 MG tablet Take 10 mg by mouth daily. 09/26/16   [provider]  Aspirin-Acetaminophen-Caffeine (GOODYS EXTRA STRENGTH) [phone] MG PACK Take 1 packet by mouth 3 (three) times daily.    [provider]  lisinopril (PRINIVIL,ZESTRIL) 40 MG tablet Take 40 mg by mouth daily. 11/20/16   [provider]  lovastatin (MEVACOR) 20 MG tablet Take 20 mg by mouth daily. 09/26/16   [provider]  meloxicam (MOBIC) 7.5 MG tablet Take 7.5 mg by mouth 2 (two) times daily.    [provider]  metFORMIN (GLUCOPHAGE) 500 MG tablet Take 500 mg by mouth 2 (two) times daily with breakfast and lunch. 09/07/16   [provider]  metoprolol (LOPRESSOR) 100 MG tablet Take 100 mg by mouth daily.    [provider]  omeprazole (PRILOSEC) 20 MG capsule Take 1 capsule (20 mg total) by mouth 2 (two) times daily. 01/28/17   Rolland Porter, MD  ondansetron (ZOFRAN ODT) 4 MG disintegrating tablet Take 1 tablet (4 mg total) by mouth every 8 (eight) hours as needed for nausea. 01/28/17   Rolland Porter, MD  oxyCODONE-acetaminophen (PERCOCET/ROXICET) 5-325 MG tablet Take 1 tablet by mouth every 6 (six) hours as needed for severe pain. 04/24/17   Shahab Polhamus A, PA-C  traMADol (ULTRAM) 50 MG tablet Take 50 mg by mouth as needed. 09/26/16   [provider]  Family History History reviewed. No pertinent family history.  Social History Social History  Substance Use Topics  . Smoking status: Former Games developermoker  . Smokeless tobacco: Never Used  . Alcohol use No     Allergies   Patient has no known allergies.   Review of Systems Review of Systems  Constitutional: Negative for activity change, chills and fever.  Respiratory: Negative for shortness of breath.   Cardiovascular: Negative for chest pain.  Gastrointestinal: Negative for abdominal pain, nausea and vomiting.    Musculoskeletal: Positive for arthralgias (right arm, right shoulder ) and myalgias. Negative for back pain, gait problem and joint swelling.  Skin: Negative for rash.  Neurological: Negative for weakness and numbness.     Physical Exam Updated Vital Signs BP 137/70 (BP Location: Right Arm)   Pulse 64   Temp 97.8 F (36.6 C) (Oral)   Resp 18   Ht 6' (1.829 m)   Wt 200 lb (90.7 kg)   SpO2 98%   BMI 27.12 kg/m   Physical Exam  Constitutional: He is oriented to person, place, and time. He appears well-developed and well-nourished.  HENT:  Head: Normocephalic and atraumatic.  Eyes: Conjunctivae are normal.  Neck: Neck supple.  Cardiovascular: Normal rate and regular rhythm.   No murmur heard. Pulmonary/Chest: Effort normal and breath sounds normal. No respiratory distress. He has no wheezes. He has no rales.  Abdominal: Soft. He exhibits no distension. There is no tenderness. There is no guarding.  Musculoskeletal: He exhibits tenderness. He exhibits no edema.  Full passive and active ROM of the bilateral shoulders, elbows, and wrists. 5/5 grip strength strength in the upper extremities. 2+ radial pulses bilaterally. Sharp and soft touch sensation intact to bilateral arms. Positive empty can test.   Neurological: He is alert and oriented to person, place, and time.  CN 2-12 intact. Normal finger-to-nose and heel-to-shin. Negative Romberg. NVI. Normal gait. Follows commands. Speaking in clear, goal-oriented sentences.   Skin: Skin is warm and dry.  Psychiatric: His behavior is normal.  Nursing note and vitals reviewed.    ED Treatments / Results  DIAGNOSTIC STUDIES: Oxygen Saturation is 97% on RA, normal by my interpretation.    COORDINATION OF CARE: 6:57 PM Discussed treatment plan with pt at bedside and pt agreed to plan, which includes a sling and pain medication.   Labs (all labs ordered are listed, but only abnormal results are displayed) Labs Reviewed - No data to  display  EKG  EKG Interpretation None       Radiology Dg Humerus Right  Result Date: 04/24/2017 CLINICAL DATA:  Fall x 2 days ago injuring Rt Humerus. Pt has pain entirely with tingling sensation radiating down RLE. Holding and Applying pressure to Upper arm only gives temporary relief. No old injury known. EXAM: RIGHT HUMERUS - 2+ VIEW COMPARISON:  None. FINDINGS: No fracture.  No bone lesion. The shoulder and elbow joints appear normally aligned. Soft tissues are unremarkable. IMPRESSION: No fracture or dislocation. Electronically Signed   By: Amie Portlandavid  Ormond M.D.   On: 04/24/2017 18:25    Procedures Procedures (including critical care time)  Medications Ordered in ED Medications  oxyCODONE-acetaminophen (PERCOCET/ROXICET) 5-325 MG per tablet 1 tablet (1 tablet Oral Given 04/24/17 1905)     Initial Impression / Assessment and Plan / ED Course  I have reviewed the triage vital signs and the nursing notes.  Pertinent labs & imaging results that were available during my care of the patient were reviewed by me  and considered in my medical decision making (see chart for details).     Patient with likely rotator cuff injury; positive empty can test. X-Ray negative for obvious fracture or dislocation. No focal deficits on neuro exam. Pain managed in ED. The patient was discussed and evaluated by Dr. Corlis Leak, attending physician. Will discharge the patient to home with a sling for comfort, pain medication, RICE therapy and a referral to ortho. Caution patient and family on conservative use and side effects of pain medication. A 8-month prescription history query was performed using the  [facility]. No complaints prior to discharge. VSS. NAD. Patient is agreeable with above plan.  Final Clinical Impressions(s) / ED Diagnoses   Final diagnoses:  Injury of right rotator cuff, initial encounter    New Prescriptions Discharge Medication List as of 04/24/2017  8:14 PM    START taking these  medications   Details  oxyCODONE-acetaminophen (PERCOCET/ROXICET) 5-325 MG tablet Take 1 tablet by mouth every 6 (six) hours as needed for severe pain., Starting Wed 04/24/2017, Print       I personally performed the services described in this documentation, which was scribed in my presence. The recorded information has been reviewed and is accurate.     Barkley Boards, PA-C 04/25/17 1632    Abelino Derrick, MD 04/25/17 4782

## 2017-04-24 NOTE — ED Notes (Signed)
Pt able to straighten and flex right arm to take pain medication and hold water to drink.  Pt rubbing right arm after pain medication administration.  Reports taking 2 tramadol earlier today with no relief.

## 2017-04-24 NOTE — Discharge Instructions (Signed)
Please call Dr. Lazaro ArmsHudnall's office to schedule an appointment. Please take the Percocet sparingly for severe pain. Please do not use the Tramadol while taking Percocet. If you develop worsening symptoms including fever, chills, numbness, or weakness in the arm, please return for re-evaluation.

## 2017-04-24 NOTE — ED Notes (Signed)
Dr Mackuen in room with pt now. 

## 2017-04-26 ENCOUNTER — Ambulatory Visit (INDEPENDENT_AMBULATORY_CARE_PROVIDER_SITE_OTHER): Payer: Medicare Other | Admitting: Family Medicine

## 2017-04-26 ENCOUNTER — Encounter: Payer: Self-pay | Admitting: Family Medicine

## 2017-04-26 DIAGNOSIS — I10 Essential (primary) hypertension: Secondary | ICD-10-CM

## 2017-04-26 DIAGNOSIS — E119 Type 2 diabetes mellitus without complications: Secondary | ICD-10-CM

## 2017-04-26 DIAGNOSIS — M25511 Pain in right shoulder: Secondary | ICD-10-CM

## 2017-04-26 DIAGNOSIS — E782 Mixed hyperlipidemia: Secondary | ICD-10-CM | POA: Diagnosis not present

## 2017-04-26 MED ORDER — PREDNISONE 10 MG PO TABS: ORAL_TABLET | ORAL | 0 refills | Status: DC

## 2017-04-26 NOTE — Patient Instructions (Signed)
You have strained your rotator cuff but also have evidence of nerve irritation into this arm. Try to avoid painful activities (overhead activities, lifting with extended arm) as much as possible. Prednisone dose pack - start today so you can get some relief and finish the 6 days worth even if you feel better. Day AFTER finishing this you can take aleve 2 tabs twice a day with food for pain and inflammation. Topical capsaicin, biofreeze, or aspercreme up to 4 times a day. Ok to take tylenol and tramadol in addition to the above too. Subacromial injection may be beneficial to help with pain and to decrease inflammation. Consider physical therapy with transition to home exercise program. Wait a couple days for the pain to calm down then do home exercise program with theraband and scapular stabilization exercises daily - these are very important for long term relief even if an injection was given. 3 sets of 10 once a day of these. If not improving at follow-up we will consider imaging, injection, physical therapy, and/or nitro patches. Follow up with me in 1 week for reevaluation.

## 2017-04-30 DIAGNOSIS — E1165 Type 2 diabetes mellitus with hyperglycemia: Secondary | ICD-10-CM | POA: Insufficient documentation

## 2017-04-30 DIAGNOSIS — I10 Essential (primary) hypertension: Secondary | ICD-10-CM | POA: Insufficient documentation

## 2017-04-30 DIAGNOSIS — E1169 Type 2 diabetes mellitus with other specified complication: Secondary | ICD-10-CM | POA: Insufficient documentation

## 2017-04-30 DIAGNOSIS — E119 Type 2 diabetes mellitus without complications: Secondary | ICD-10-CM | POA: Insufficient documentation

## 2017-04-30 DIAGNOSIS — E785 Hyperlipidemia, unspecified: Secondary | ICD-10-CM | POA: Insufficient documentation

## 2017-04-30 DIAGNOSIS — E7800 Pure hypercholesterolemia, unspecified: Secondary | ICD-10-CM | POA: Insufficient documentation

## 2017-04-30 DIAGNOSIS — M25511 Pain in right shoulder: Secondary | ICD-10-CM | POA: Insufficient documentation

## 2017-04-30 DIAGNOSIS — E1159 Type 2 diabetes mellitus with other circulatory complications: Secondary | ICD-10-CM | POA: Insufficient documentation

## 2017-04-30 NOTE — Progress Notes (Signed)
PCP: Joshua [name], Wilson Oliver, MD  Subjective:   HPI: Patient is a 81 y.o. male here for right shoulder pain.  Patient denies acute injury or trauma.  He reports last Thursday he started to get pain right lateral shoulder that was sharp, to 8/10 level. Has been working in the yard - more pain after trimming a vine back. No history of surgery to this shoulder. Tried tramadol, percocet (but stopped because of heart racing). No numbness or tingling but pain does go down arm into fingers. Right handed. No skin changes.  Past Medical History:  Diagnosis Date  . Diabetes mellitus without complication (HCC)   . High cholesterol   . Hypertension     Current Outpatient Prescriptions on File Prior to Visit  Medication Sig Dispense Refill  . allopurinol (ZYLOPRIM) 300 MG tablet Take 300 mg by mouth 2 (two) times daily.    Marland Kitchen. amLODipine (NORVASC) 10 MG tablet Take 10 mg by mouth daily.    . Aspirin-Acetaminophen-Caffeine (GOODYS EXTRA STRENGTH) [phone] MG PACK Take 1 packet by mouth 3 (three) times daily.    Marland Kitchen. lisinopril (PRINIVIL,ZESTRIL) 40 MG tablet Take 40 mg by mouth daily.    . meloxicam (MOBIC) 7.5 MG tablet Take 7.5 mg by mouth 2 (two) times daily.    . metFORMIN (GLUCOPHAGE) 500 MG tablet Take 500 mg by mouth 2 (two) times daily with breakfast and lunch.    . metoprolol (LOPRESSOR) 100 MG tablet Take 100 mg by mouth daily.    Marland Kitchen. omeprazole (PRILOSEC) 20 MG capsule Take 1 capsule (20 mg total) by mouth 2 (two) times daily. 60 capsule 0  . ondansetron (ZOFRAN ODT) 4 MG disintegrating tablet Take 1 tablet (4 mg total) by mouth every 8 (eight) hours as needed for nausea. 6 tablet 0  . traMADol (ULTRAM) 50 MG tablet Take 50 mg by mouth as needed.     No current facility-administered medications on file prior to visit.     No past surgical history on file.  No Known Allergies  Social History   Social History  . Marital status: Married    Spouse name: N/A  . Number of children: N/A   . Years of education: N/A   Occupational History  . Not on file.   Social History Main Topics  . Smoking status: Former Games developermoker  . Smokeless tobacco: Never Used  . Alcohol use No  . Drug use: Unknown  . Sexual activity: Not on file   Other Topics Concern  . Not on file   Social History Narrative  . No narrative on file    No family history on file.  BP (!) 154/84   Pulse 96   Ht 6' (1.829 m)   Wt 203 lb (92.1 kg)   BMI 27.53 kg/m   Review of Systems: See HPI above.     Objective:  Physical Exam:  Gen: NAD, comfortable in exam room  Neck: No gross deformity, swelling, bruising. No TTP.  No midline/bony TTP. FROM BUE strength 5/5.   Sensation intact to light touch.   2+ equal reflexes in triceps.  Trace biceps, brachioradialis tendons. Negative spurlings. NV intact distal BUEs.  Right shoulder: No swelling, ecchymoses.  No gross deformity. TTP lateral shoulder just distal to acromion. FROM with painful arc. Negative Hawkins, Neers. Negative Yergasons. Strength 4/5 with empty can and 5/5 resisted internal/external rotation. Negative apprehension. NV intact distally.  Left shoulder: FROM without pain.   Assessment & Plan:  1. Right shoulder pain -  primary issue rotator cuff strain without tear.  Also with some nerve irritation as pain goes down to fingers as well but neck exam is reassuring.  Discussed options - he would like to start with prednisone dose pack and home exercises which were reviewed today.  Consider imaging, injection, therapy, nitro patches if not improving.  F/u in 1 week.

## 2017-04-30 NOTE — Assessment & Plan Note (Signed)
primary issue rotator cuff strain without tear.  Also with some nerve irritation as pain goes down to fingers as well but neck exam is reassuring.  Discussed options - he would like to start with prednisone dose pack and home exercises which were reviewed today.  Consider imaging, injection, therapy, nitro patches if not improving.  F/u in 1 week.

## 2017-05-02 ENCOUNTER — Ambulatory Visit (INDEPENDENT_AMBULATORY_CARE_PROVIDER_SITE_OTHER): Payer: Medicare Other | Admitting: Family Medicine

## 2017-05-02 ENCOUNTER — Ambulatory Visit: Payer: Medicare Other | Admitting: Family Medicine

## 2017-05-02 DIAGNOSIS — M25511 Pain in right shoulder: Secondary | ICD-10-CM

## 2017-05-02 MED ORDER — OXYCODONE-ACETAMINOPHEN 5-325 MG PO TABS: 1.0000 | ORAL_TABLET | Freq: Four times a day (QID) | ORAL | 0 refills | Status: DC | PRN

## 2017-05-02 MED ORDER — METHYLPREDNISOLONE ACETATE 40 MG/ML IJ SUSP
40.0000 mg | Freq: Once | INTRAMUSCULAR | Status: AC
  Administered 2017-05-02: 40 mg via INTRA_ARTICULAR

## 2017-05-02 NOTE — Patient Instructions (Signed)
You were given a shoulder (subacromial) injection today. We will also set up an MRI of your neck to assess for pinched nerve here. Ok to take aleve 2 tabs twice a day with food for pain and inflammation. Topical capsaicin, biofreeze, or aspercreme up to 4 times a day. Percocet as needed for severe pain (no driving on this). I will contact you with the MRI results and next steps.

## 2017-05-04 NOTE — Assessment & Plan Note (Signed)
Had transient benefit with prednisone.  Level of pain and distribution suggest nerve irritation possibly from cervical radiculopathy though he also has evidence of rotator cuff strain/impingement.  Given subacromial injection today.  They would also like to go ahead with MRI cervical spine - will order this.  Aleve, topical medications with percocet as needed for severe pain.  Will contact following MRI about next steps.   After informed written consent, patient was seated on exam table. Right shoulder was prepped with alcohol swab and utilizing posterior approach, patient's right subacromial space was injected with 3:1 bupivicaine: depomedrol. Patient tolerated the procedure well without immediate complications.

## 2017-05-04 NOTE — Progress Notes (Addendum)
PCP: Kaleen Mask, MD  Subjective:   HPI: Patient is a 81 y.o. male here for right shoulder pain.  5/18: Patient denies acute injury or trauma.  He reports last Thursday he started to get pain right lateral shoulder that was sharp, to 8/10 level. Has been working in the yard - more pain after trimming a vine back. No history of surgery to this shoulder. Tried tramadol, percocet (but stopped because of heart racing). No numbness or tingling but pain does go down arm into fingers. Right handed. No skin changes.  5/24: Patient returns with pain about the same as last visit. Prednisone helped while taking this. He does report he has taken percocet which helps some (no heart racing with this as reported last visit). Pain is 8/10 and sharp. Pain still radiating down from shoulder/neck into arm. Worse with movements of shoulder. No numbness or tingling. No skin changes.  Past Medical History:  Diagnosis Date  . Diabetes mellitus without complication (HCC)   . High cholesterol   . Hypertension     Current Outpatient Prescriptions on File Prior to Visit  Medication Sig Dispense Refill  . allopurinol (ZYLOPRIM) 300 MG tablet Take 300 mg by mouth 2 (two) times daily.    Marland Kitchen amLODipine (NORVASC) 10 MG tablet Take 10 mg by mouth daily.    . Aspirin-Acetaminophen-Caffeine (GOODYS EXTRA STRENGTH) [phone] MG PACK Take 1 packet by mouth 3 (three) times daily.    Marland Kitchen gabapentin (NEURONTIN) 300 MG capsule     . lisinopril (PRINIVIL,ZESTRIL) 40 MG tablet Take 40 mg by mouth daily.    Marland Kitchen lovastatin (MEVACOR) 40 MG tablet     . meloxicam (MOBIC) 7.5 MG tablet Take 7.5 mg by mouth 2 (two) times daily.    . metFORMIN (GLUCOPHAGE) 500 MG tablet Take 500 mg by mouth 2 (two) times daily with breakfast and lunch.    . metoprolol (LOPRESSOR) 100 MG tablet Take 100 mg by mouth daily.    Marland Kitchen omeprazole (PRILOSEC) 20 MG capsule Take 1 capsule (20 mg total) by mouth 2 (two) times daily. 60 capsule 0   . ondansetron (ZOFRAN ODT) 4 MG disintegrating tablet Take 1 tablet (4 mg total) by mouth every 8 (eight) hours as needed for nausea. 6 tablet 0  . PEG 3350-KCl-NaBcb-NaCl-NaSulf (PEG 3350/ELECTROLYTES) 240 g SOLR     . sucralfate (CARAFATE) 1 g tablet      No current facility-administered medications on file prior to visit.     No past surgical history on file.  No Known Allergies  Social History   Social History  . Marital status: Married    Spouse name: N/A  . Number of children: N/A  . Years of education: N/A   Occupational History  . Not on file.   Social History Main Topics  . Smoking status: Former Games developer  . Smokeless tobacco: Never Used  . Alcohol use No  . Drug use: Unknown  . Sexual activity: Not on file   Other Topics Concern  . Not on file   Social History Narrative  . No narrative on file    No family history on file.  There were no vitals taken for this visit.  Review of Systems: See HPI above.     Objective:  Physical Exam:  Gen: NAD, comfortable in exam room  Neck: No gross deformity, swelling, bruising. No TTP.  No midline/bony TTP. FROM. BUE strength 5/5.   Sensation intact to light touch.   2+ equal reflexes  in triceps.  Trace biceps, brachioradialis tendons. Negative spurlings. NV intact distal BUEs.  Right shoulder: No swelling, ecchymoses.  No gross deformity. No TTP. FROM with painful arc. Negative Hawkins, Neers. Negative Yergasons. Strength 4/5 with empty can and 5/5 resisted internal/external rotation. Negative apprehension. NV intact distally.  Left shoulder: FROM without pain.   Assessment & Plan:  1. Right shoulder pain - Had transient benefit with prednisone.  Level of pain and distribution suggest nerve irritation possibly from cervical radiculopathy though he also has evidence of rotator cuff strain/impingement.  Given subacromial injection today.  They would also like to go ahead with MRI cervical spine - will  order this.  Aleve, topical medications with percocet as needed for severe pain.  Will contact following MRI about next steps.   After informed written consent, patient was seated on exam table. Right shoulder was prepped with alcohol swab and utilizing posterior approach, patient's right subacromial space was injected with 3:1 bupivicaine: depomedrol. Patient tolerated the procedure well without immediate complications.  Addendum:  MRI reviewed and discussed with patient's daughter.  He has not had benefit from trial of subacromial injection.  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  Recommended eval by neurosurgery (he is not having bowel/bladder dysfunction, saddle anesthesia, weakness of lower extremities).

## 2017-05-08 ENCOUNTER — Ambulatory Visit
Admission: RE | Admit: 2017-05-08 | Discharge: 2017-05-08 | Disposition: A | Discharge status: Home / Self Care | Payer: Medicare Other | Source: Ambulatory Visit | Attending: Family Medicine | Admitting: Family Medicine

## 2017-05-08 DIAGNOSIS — M25511 Pain in right shoulder: Secondary | ICD-10-CM

## 2017-05-12 ENCOUNTER — Other Ambulatory Visit: Payer: Medicare Other

## 2017-05-14 ENCOUNTER — Telehealth: Payer: Self-pay | Admitting: Family Medicine

## 2017-05-14 NOTE — Telephone Encounter (Signed)
Patient's daughter calling in regards to referral to a neurosurgeon. States they have not heard anything.   Daughter states [place] in [place] and would prefer an office there if possible.   Daughter would like a call back at [phone]. She handles patient's care

## 2017-05-20 ENCOUNTER — Telehealth: Payer: Self-pay | Admitting: Family Medicine

## 2017-05-20 NOTE — Telephone Encounter (Signed)
Daughter called on 6/8 stating patient was experiencing swelling in his right hand and from his elbow down to his hand feels very heavy  Daughter requesting advice on if patient needs to be seen by provider

## 2017-05-20 NOTE — Telephone Encounter (Signed)
Has he seen neurosurgery yet?  We should take a look at him just to make sure there's not something else going on in addition to his neck issues.

## 2017-05-24 NOTE — Telephone Encounter (Signed)
Spoke with patient's daughter on 6/11. Stated patient was feeling better and would call back to make an appointment if need be.

## 2017-06-18 ENCOUNTER — Other Ambulatory Visit: Payer: Self-pay | Admitting: Neurosurgery

## 2017-07-01 ENCOUNTER — Other Ambulatory Visit (HOSPITAL_COMMUNITY): Payer: Self-pay

## 2017-07-01 NOTE — Pre-Procedure Instructions (Addendum)
Joshua MannanLarry D [name]  07/01/2017      [facility] - [place], [place] - [address] [address] [address] [address] [address] [name] [id number] Phone: [phone] Fax: [phone]    Your procedure is scheduled on July 11, 2017  [name] "A" at 8:15 A.M.  Call this number if you have problems the morning of surgery:  [phone]   Remember:  Do not eat food or drink liquids after midnight on July 10, 2017  Take these medicines the morning of surgery with A SIP OF WATER: Allopurinol (ZYLOPRIM), AmLODipine (NORVASC), Metoprolol (LOPRESSOR), Omeprazole (PRILOSEC). If needed Ondansetron (ZOFRAN ODT) and  OxyCODONE-acetaminophen (PERCOCET/ROXICET).  7 days before surgery stop taking all Aspirins, Vitamins, Fish oils, and Herbal medications. Also all NSAIDS i.e. Advil, Motrin, Aleve, Anaprox, Naproxen, BC, Goody Powders, and Meloxicam (MOBIC).  How to Manage Your Diabetes Before and After Surgery  Why is it important to control my blood sugar before and after surgery? . Improving blood sugar levels before and after surgery helps healing and can limit problems. . A way of improving blood sugar control is eating a healthy diet by: o  Eating less sugar and carbohydrates o  Increasing activity/exercise o  Talking with your doctor about reaching your blood sugar goals . High blood sugars (greater than 180 mg/dL) can raise your risk of infections and slow your recovery, so you will need to focus on controlling your diabetes during the weeks before surgery. . Make sure that the doctor who takes care of your diabetes knows about your planned surgery including the date and location.  How do I manage my blood sugar before surgery? . Check your blood sugar at least 4 times a day, starting 2 days before surgery, to make sure that the level is not too high or low. o Check your blood sugar the morning of your surgery when you wake up  and every 2 hours until you get to the Short Stay unit. . If your blood sugar is less than 70 mg/dL, you will need to treat for low blood sugar: o Do not take insulin. o Treat a low blood sugar (less than 70 mg/dL) with  cup of clear juice (cranberry or apple), 4 glucose tablets, OR glucose gel. o Recheck blood sugar in 15 minutes after treatment (to make sure it is greater than 70 mg/dL). If your blood sugar is not greater than 70 mg/dL on recheck, call [phone] for further instructions. . Report your blood sugar to the short stay nurse when you get to Short Stay.  . If you are admitted to the hospital after surgery: o Your blood sugar will be checked by the staff and you will probably be given insulin after surgery (instead of oral diabetes medicines) to make sure you have good blood sugar levels. o The goal for blood sugar control after surgery is 80-180 mg/dL.  WHAT DO I DO ABOUT MY DIABETES MEDICATION?  Marland Kitchen. Do not take MetFORMIN (GLUCOPHAGE) the morning of surgery.   Do not wear jewelry.  Do not wear lotions, powders, or perfumes, or deodorant.  Do not shave 48 hours prior to surgery.  Men may shave face.  Do not bring valuables to the hospital.  [facility] is not responsible for any belongings or valuables.  Contacts, dentures or bridgework may not be worn into surgery.  Leave your suitcase in the car.  After surgery it may be brought to your  room.  For patients admitted to the hospital, discharge time will be determined by your treatment team.   Special instructions:   [facility]- Preparing For Surgery  Before surgery, you can play an important role. Because skin is not sterile, your skin needs to be as free of germs as possible. You can reduce the number of germs on your skin by washing with CHG (chlorahexidine gluconate) Soap before surgery.  CHG is an antiseptic cleaner which kills germs and bonds with the skin to continue killing germs even after washing.  Please do not  use if you have an allergy to CHG or antibacterial soaps. If your skin becomes reddened/irritated stop using the CHG.  Do not shave (including legs and underarms) for at least 48 hours prior to first CHG shower. It is OK to shave your face.  Please follow these instructions carefully.   1. Shower the NIGHT BEFORE SURGERY and the MORNING OF SURGERY with CHG.   2. If you chose to wash your hair, wash your hair first as usual with your normal shampoo.  3. After you shampoo, rinse your hair and body thoroughly to remove the shampoo.  4. Use CHG as you would any other liquid soap. You can apply CHG directly to the skin and wash gently with a scrungie or a clean washcloth.   5. Apply the CHG Soap to your body ONLY FROM THE NECK DOWN.  Do not use on open wounds or open sores. Avoid contact with your eyes, ears, mouth and genitals (private parts). Wash genitals (private parts) with your normal soap.  6. Wash thoroughly, paying special attention to the area where your surgery will be performed.  7. Thoroughly rinse your body with warm water from the neck down.  8. DO NOT shower/wash with your normal soap after using and rinsing off the CHG Soap.  9. Pat yourself dry with a CLEAN TOWEL.   10. Wear CLEAN PAJAMAS   11. Place CLEAN SHEETS on your bed the night of your first shower and DO NOT SLEEP WITH PETS. 12.  Day of Surgery: Do not apply any deodorants/lotions. Please wear clean clothes to the hospital/surgery center.    Please read over the following fact sheets that you were given. Pain Booklet, Coughing and Deep Breathing, MRSA Information and Surgical Site Infection Prevention

## 2017-07-02 ENCOUNTER — Encounter (HOSPITAL_COMMUNITY)
Admission: RE | Admit: 2017-07-02 | Discharge: 2017-07-02 | Disposition: A | Discharge status: Home / Self Care | Payer: Medicare Other | Source: Ambulatory Visit | Attending: Neurosurgery | Admitting: Neurosurgery

## 2017-07-02 ENCOUNTER — Encounter (HOSPITAL_COMMUNITY): Payer: Self-pay

## 2017-07-02 DIAGNOSIS — Z7951 Long term (current) use of inhaled steroids: Secondary | ICD-10-CM | POA: Diagnosis not present

## 2017-07-02 DIAGNOSIS — Z01818 Encounter for other preprocedural examination: Secondary | ICD-10-CM | POA: Diagnosis present

## 2017-07-02 DIAGNOSIS — Z79899 Other long term (current) drug therapy: Secondary | ICD-10-CM | POA: Insufficient documentation

## 2017-07-02 DIAGNOSIS — I10 Essential (primary) hypertension: Secondary | ICD-10-CM | POA: Diagnosis not present

## 2017-07-02 DIAGNOSIS — E7800 Pure hypercholesterolemia, unspecified: Secondary | ICD-10-CM | POA: Insufficient documentation

## 2017-07-02 DIAGNOSIS — G959 Disease of spinal cord, unspecified: Secondary | ICD-10-CM | POA: Insufficient documentation

## 2017-07-02 DIAGNOSIS — Z01812 Encounter for preprocedural laboratory examination: Secondary | ICD-10-CM | POA: Insufficient documentation

## 2017-07-02 DIAGNOSIS — Z7984 Long term (current) use of oral hypoglycemic drugs: Secondary | ICD-10-CM | POA: Diagnosis not present

## 2017-07-02 DIAGNOSIS — Z87891 Personal history of nicotine dependence: Secondary | ICD-10-CM | POA: Diagnosis not present

## 2017-07-02 DIAGNOSIS — M1990 Unspecified osteoarthritis, unspecified site: Secondary | ICD-10-CM | POA: Diagnosis not present

## 2017-07-02 DIAGNOSIS — R001 Bradycardia, unspecified: Secondary | ICD-10-CM | POA: Diagnosis not present

## 2017-07-02 DIAGNOSIS — R9431 Abnormal electrocardiogram [ECG] [EKG]: Secondary | ICD-10-CM | POA: Diagnosis not present

## 2017-07-02 DIAGNOSIS — E119 Type 2 diabetes mellitus without complications: Secondary | ICD-10-CM | POA: Diagnosis not present

## 2017-07-02 DIAGNOSIS — I440 Atrioventricular block, first degree: Secondary | ICD-10-CM | POA: Diagnosis not present

## 2017-07-02 HISTORY — DX: Headache: R51

## 2017-07-02 HISTORY — DX: Anemia, unspecified: D64.9

## 2017-07-02 HISTORY — DX: Headache, unspecified: R51.9

## 2017-07-02 HISTORY — DX: Personal history of urinary calculi: Z87.442

## 2017-07-02 HISTORY — DX: Pneumonia, unspecified organism: J18.9

## 2017-07-02 HISTORY — DX: Unspecified osteoarthritis, unspecified site: M19.90

## 2017-07-02 LAB — BASIC METABOLIC PANEL
ANION GAP: 7 (ref 5–15)
BUN: 15 mg/dL (ref 6–20)
CO2: 26 mmol/L (ref 22–32)
Calcium: 9.6 mg/dL (ref 8.9–10.3)
Chloride: 108 mmol/L (ref 101–111)
Creatinine, Ser: 1.26 mg/dL — ABNORMAL HIGH (ref 0.61–1.24)
GFR calc Af Amer: >60 mL/min (ref >60)
GFR, EST NON AFRICAN AMERICAN: 52 mL/min — AB (ref >60)
GLUCOSE: 188 mg/dL — AB (ref 65–99)
POTASSIUM: 4 mmol/L (ref 3.5–5.1)
Sodium: 141 mmol/L (ref 135–145)

## 2017-07-02 LAB — CBC
HEMATOCRIT: 41.7 % (ref 39.0–52.0)
HEMOGLOBIN: 13.6 g/dL (ref 13.0–17.0)
MCH: 30.8 pg (ref 26.0–34.0)
MCHC: 32.6 g/dL (ref 30.0–36.0)
MCV: 94.3 fL (ref 78.0–100.0)
Platelets: 241 10*3/uL (ref 150–400)
RBC: 4.42 MIL/uL (ref 4.22–5.81)
RDW: 14.9 % (ref 11.5–15.5)
WBC: 9.1 10*3/uL (ref 4.0–10.5)

## 2017-07-02 LAB — GLUCOSE, CAPILLARY: GLUCOSE-CAPILLARY: 230 mg/dL — AB (ref 65–99)

## 2017-07-02 LAB — SURGICAL PCR SCREEN
MRSA, PCR: NEGATIVE
STAPHYLOCOCCUS AUREUS: NEGATIVE

## 2017-07-02 NOTE — Progress Notes (Addendum)
Cardiac Catheterization over 10 yrs ago Dr. Arlyn Leakilly, pt. States everything okay. Has not seen cardiologist since then.    Comparison EKG requested from Dr. Windle GuardWilson Elkins office.

## 2017-07-03 LAB — HEMOGLOBIN A1C
Hgb A1c MFr Bld: 7.5 % — ABNORMAL HIGH (ref 4.8–5.6)
MEAN PLASMA GLUCOSE: 169 mg/dL

## 2017-07-04 NOTE — Progress Notes (Signed)
Anesthesia Chart Review: Patient is a 81 year old male scheduled for C3-4 ACDF on 07/11/17 by Dr. Wynetta Emeryram.  History includes former smoker (quit '17), HTN, hypercholesterolemia, DM2, anemia, headaches, arthritis.   PCP is Dr. Windle GuardWilson Elkins.  Meds include allopurinol, amlodipine, Flonase, Neurontin, lisinopril, lovastatin, metformin, Lopressor, Percocet.  BP (!) 136/58   Pulse (!) 55   Temp 36.7 C   Resp 20   Ht 6' (1.829 m)   Wt 201 lb 12.8 oz (91.5 kg)   SpO2 95%   BMI 27.37 kg/m   EKG 07/02/17: SB at 55 bpm, first degree AV block, possible inferior infarct (age undetermined). He has comparison tracings in [facility] from 09/10/05 and 11/24/01. 1st degee AV block also noted on 2006 tracing. Inferior leads look similar when compared to 2002 tracing.  Cardiac cath 08/24/04 (Dr. Viann FishSpencer Tilley): IMPRESSION:  1.  Normal left ventricular function.  2.  Scattered irregularities, but no significant obstructive coronary artery      disease noted.  3.  Trace to mild mitral regurgitation that may be catheter induced.  Preoperative labs noted. CBC WNL. A1c 7.5. Cr 1.26.  If no acute changes then I would anticipate that he could proceed as planned.  Velna Ochsllison Orva Riles, PA-C [facility]/Anesthesiology Phone [phone] 07/04/2017 6:19 PM

## 2017-07-10 NOTE — Anesthesia Preprocedure Evaluation (Addendum)
Anesthesia Evaluation  Patient identified by MRN, date of birth, ID band Patient awake    Reviewed: Allergy & Precautions, H&P , NPO status , Patient's Chart, lab work & pertinent test results  Airway Mallampati: II  TM Distance: >3 FB Neck ROM: Full    Dental no notable dental hx. (+) Edentulous Upper, Partial Lower, Dental Advisory Given   Pulmonary neg pulmonary ROS, former smoker,    Pulmonary exam normal breath sounds clear to auscultation       Cardiovascular Exercise Tolerance: Good hypertension, Pt. on medications and Pt. on home beta blockers  Rhythm:Regular Rate:Normal     Neuro/Psych  Headaches, negative psych ROS   GI/Hepatic negative GI ROS, Neg liver ROS,   Endo/Other  diabetes, Type 2, Oral Hypoglycemic Agents  Renal/GU negative Renal ROS  negative genitourinary   Musculoskeletal  (+) Arthritis , Osteoarthritis,    Abdominal   Peds  Hematology negative hematology ROS (+) anemia ,   Anesthesia Other Findings   Reproductive/Obstetrics negative OB ROS                            Anesthesia Physical Anesthesia Plan  ASA: II  Anesthesia Plan: General   Post-op Pain Management:    Induction: Intravenous  PONV Risk Score and Plan: 3 and Ondansetron, Dexamethasone and Midazolam  Airway Management Planned: Oral ETT and Video Laryngoscope Planned  Additional Equipment:   Intra-op Plan:   Post-operative Plan: Extubation in OR  Informed Consent: I have reviewed the patients History and Physical, chart, labs and discussed the procedure including the risks, benefits and alternatives for the proposed anesthesia with the patient or authorized representative who has indicated his/her understanding and acceptance.   Dental advisory given  Plan Discussed with: CRNA  Anesthesia Plan Comments:        Anesthesia Quick Evaluation

## 2017-07-11 ENCOUNTER — Encounter (HOSPITAL_COMMUNITY)
Admission: RE | Disposition: A | Discharge status: Home / Self Care | Payer: Self-pay | Source: Ambulatory Visit | Attending: Neurosurgery

## 2017-07-11 ENCOUNTER — Observation Stay (HOSPITAL_COMMUNITY)
Admission: RE | Admit: 2017-07-11 | Discharge: 2017-07-12 | Disposition: A | Discharge status: Home / Self Care | Payer: Medicare Other | Source: Ambulatory Visit | Attending: Neurosurgery | Admitting: Neurosurgery

## 2017-07-11 ENCOUNTER — Inpatient Hospital Stay (HOSPITAL_COMMUNITY): Payer: Medicare Other | Admitting: Vascular Surgery

## 2017-07-11 ENCOUNTER — Inpatient Hospital Stay (HOSPITAL_COMMUNITY): Payer: Medicare Other

## 2017-07-11 ENCOUNTER — Inpatient Hospital Stay (HOSPITAL_COMMUNITY): Payer: Medicare Other | Admitting: Anesthesiology

## 2017-07-11 ENCOUNTER — Encounter (HOSPITAL_COMMUNITY): Payer: Self-pay | Admitting: Certified Registered Nurse Anesthetist

## 2017-07-11 DIAGNOSIS — Z419 Encounter for procedure for purposes other than remedying health state, unspecified: Secondary | ICD-10-CM

## 2017-07-11 DIAGNOSIS — Z79899 Other long term (current) drug therapy: Secondary | ICD-10-CM | POA: Diagnosis not present

## 2017-07-11 DIAGNOSIS — E119 Type 2 diabetes mellitus without complications: Secondary | ICD-10-CM | POA: Diagnosis not present

## 2017-07-11 DIAGNOSIS — Z87891 Personal history of nicotine dependence: Secondary | ICD-10-CM | POA: Insufficient documentation

## 2017-07-11 DIAGNOSIS — M1990 Unspecified osteoarthritis, unspecified site: Secondary | ICD-10-CM | POA: Diagnosis not present

## 2017-07-11 DIAGNOSIS — Z7984 Long term (current) use of oral hypoglycemic drugs: Secondary | ICD-10-CM | POA: Insufficient documentation

## 2017-07-11 DIAGNOSIS — E7800 Pure hypercholesterolemia, unspecified: Secondary | ICD-10-CM | POA: Diagnosis not present

## 2017-07-11 DIAGNOSIS — I10 Essential (primary) hypertension: Secondary | ICD-10-CM | POA: Diagnosis not present

## 2017-07-11 DIAGNOSIS — M5001 Cervical disc disorder with myelopathy,  high cervical region: Principal | ICD-10-CM | POA: Insufficient documentation

## 2017-07-11 DIAGNOSIS — G959 Disease of spinal cord, unspecified: Secondary | ICD-10-CM | POA: Diagnosis present

## 2017-07-11 HISTORY — PX: ANTERIOR CERVICAL DECOMP/DISCECTOMY FUSION: SHX1161

## 2017-07-11 LAB — GLUCOSE, CAPILLARY
GLUCOSE-CAPILLARY: 166 mg/dL — AB (ref 65–99)
GLUCOSE-CAPILLARY: 170 mg/dL — AB (ref 65–99)
GLUCOSE-CAPILLARY: 350 mg/dL — AB (ref 65–99)
Glucose-Capillary: 245 mg/dL — ABNORMAL HIGH (ref 65–99)

## 2017-07-11 SURGERY — ANTERIOR CERVICAL DECOMPRESSION/DISCECTOMY FUSION 1 LEVEL
Anesthesia: General | Site: Neck

## 2017-07-11 MED ORDER — CEFAZOLIN SODIUM-DEXTROSE 2-4 GM/100ML-% IV SOLN
2.0000 g | INTRAVENOUS | Status: AC
  Administered 2017-07-11: 2 g via INTRAVENOUS

## 2017-07-11 MED ORDER — LACTATED RINGERS IV SOLN
INTRAVENOUS | Status: DC
  Administered 2017-07-11: 08:00:00 via INTRAVENOUS

## 2017-07-11 MED ORDER — SUGAMMADEX SODIUM 200 MG/2ML IV SOLN
INTRAVENOUS | Status: AC
  Filled 2017-07-11: qty 2

## 2017-07-11 MED ORDER — SODIUM CHLORIDE 0.9 % IV SOLN: 250.0000 mL | INTRAVENOUS | Status: DC

## 2017-07-11 MED ORDER — HYDROMORPHONE HCL 1 MG/ML IJ SOLN
0.2500 mg | INTRAMUSCULAR | Status: DC | PRN
  Administered 2017-07-11 (×3): 0.5 mg via INTRAVENOUS

## 2017-07-11 MED ORDER — LISINOPRIL 20 MG PO TABS
40.0000 mg | ORAL_TABLET | Freq: Every day | ORAL | Status: DC
  Administered 2017-07-11 – 2017-07-12 (×2): 40 mg via ORAL
  Filled 2017-07-11 (×2): qty 2

## 2017-07-11 MED ORDER — SUGAMMADEX SODIUM 200 MG/2ML IV SOLN
INTRAVENOUS | Status: DC | PRN
  Administered 2017-07-11: 200 mg via INTRAVENOUS

## 2017-07-11 MED ORDER — PROPOFOL 10 MG/ML IV BOLUS
INTRAVENOUS | Status: DC | PRN
  Administered 2017-07-11: 120 mg via INTRAVENOUS

## 2017-07-11 MED ORDER — 0.9 % SODIUM CHLORIDE (POUR BTL) OPTIME
TOPICAL | Status: DC | PRN
  Administered 2017-07-11: 1000 mL

## 2017-07-11 MED ORDER — METOPROLOL TARTRATE 100 MG PO TABS
100.0000 mg | ORAL_TABLET | Freq: Every day | ORAL | Status: DC
  Administered 2017-07-12: 100 mg via ORAL
  Filled 2017-07-11: qty 1

## 2017-07-11 MED ORDER — PHENYLEPHRINE HCL 10 MG/ML IJ SOLN
INTRAVENOUS | Status: DC | PRN
  Administered 2017-07-11: 25 ug/min via INTRAVENOUS

## 2017-07-11 MED ORDER — PHENOL 1.4 % MT LIQD: 1.0000 | OROMUCOSAL | Status: DC | PRN

## 2017-07-11 MED ORDER — FENTANYL CITRATE (PF) 100 MCG/2ML IJ SOLN
INTRAMUSCULAR | Status: DC | PRN
  Administered 2017-07-11: 25 ug via INTRAVENOUS
  Administered 2017-07-11: 100 ug via INTRAVENOUS
  Administered 2017-07-11: 50 ug via INTRAVENOUS

## 2017-07-11 MED ORDER — CEFAZOLIN SODIUM-DEXTROSE 2-4 GM/100ML-% IV SOLN
2.0000 g | Freq: Three times a day (TID) | INTRAVENOUS | Status: AC
  Administered 2017-07-11 (×2): 2 g via INTRAVENOUS
  Filled 2017-07-11: qty 100

## 2017-07-11 MED ORDER — THROMBIN 5000 UNITS EX SOLR
CUTANEOUS | Status: AC
  Filled 2017-07-11: qty 15000

## 2017-07-11 MED ORDER — FLUTICASONE PROPIONATE 50 MCG/ACT NA SUSP
2.0000 | Freq: Every day | NASAL | Status: DC
  Administered 2017-07-11: 2 via NASAL
  Filled 2017-07-11: qty 16

## 2017-07-11 MED ORDER — FENTANYL CITRATE (PF) 250 MCG/5ML IJ SOLN
INTRAMUSCULAR | Status: AC
  Filled 2017-07-11: qty 5

## 2017-07-11 MED ORDER — DEXAMETHASONE SODIUM PHOSPHATE 10 MG/ML IJ SOLN
INTRAMUSCULAR | Status: AC
  Filled 2017-07-11: qty 1

## 2017-07-11 MED ORDER — HYDROMORPHONE HCL 1 MG/ML IJ SOLN
INTRAMUSCULAR | Status: AC
  Filled 2017-07-11: qty 1

## 2017-07-11 MED ORDER — ONDANSETRON HCL 4 MG PO TABS: 4.0000 mg | ORAL_TABLET | Freq: Four times a day (QID) | ORAL | Status: DC | PRN

## 2017-07-11 MED ORDER — AMLODIPINE BESYLATE 10 MG PO TABS
10.0000 mg | ORAL_TABLET | Freq: Every day | ORAL | Status: DC
  Administered 2017-07-12: 10 mg via ORAL
  Filled 2017-07-11: qty 1

## 2017-07-11 MED ORDER — BACITRACIN 50000 UNITS IM SOLR
INTRAMUSCULAR | Status: DC | PRN
  Administered 2017-07-11: 500 mL

## 2017-07-11 MED ORDER — HEMOSTATIC AGENTS (NO CHARGE) OPTIME
TOPICAL | Status: DC | PRN
  Administered 2017-07-11: 1 via TOPICAL

## 2017-07-11 MED ORDER — OXYCODONE HCL 5 MG PO TABS
5.0000 mg | ORAL_TABLET | ORAL | Status: DC | PRN
  Administered 2017-07-11 – 2017-07-12 (×4): 10 mg via ORAL
  Filled 2017-07-11 (×4): qty 2

## 2017-07-11 MED ORDER — ACETAMINOPHEN 325 MG PO TABS: 650.0000 mg | ORAL_TABLET | ORAL | Status: DC | PRN

## 2017-07-11 MED ORDER — PROPOFOL 10 MG/ML IV BOLUS
INTRAVENOUS | Status: AC
  Filled 2017-07-11: qty 20

## 2017-07-11 MED ORDER — SODIUM CHLORIDE 0.9% FLUSH: 3.0000 mL | INTRAVENOUS | Status: DC | PRN

## 2017-07-11 MED ORDER — ALLOPURINOL 300 MG PO TABS
300.0000 mg | ORAL_TABLET | Freq: Two times a day (BID) | ORAL | Status: DC
  Administered 2017-07-11 – 2017-07-12 (×2): 300 mg via ORAL
  Filled 2017-07-11 (×2): qty 1

## 2017-07-11 MED ORDER — ROCURONIUM BROMIDE 100 MG/10ML IV SOLN
INTRAVENOUS | Status: DC | PRN
  Administered 2017-07-11: 50 mg via INTRAVENOUS

## 2017-07-11 MED ORDER — METFORMIN HCL 500 MG PO TABS
1000.0000 mg | ORAL_TABLET | Freq: Every day | ORAL | Status: DC
  Administered 2017-07-12: 1000 mg via ORAL
  Filled 2017-07-11: qty 2

## 2017-07-11 MED ORDER — CHLORHEXIDINE GLUCONATE CLOTH 2 % EX PADS: 6.0000 | MEDICATED_PAD | Freq: Once | CUTANEOUS | Status: DC

## 2017-07-11 MED ORDER — LACTATED RINGERS IV SOLN
INTRAVENOUS | Status: DC
  Administered 2017-07-11: 09:00:00 via INTRAVENOUS

## 2017-07-11 MED ORDER — MENTHOL 3 MG MT LOZG
1.0000 | LOZENGE | OROMUCOSAL | Status: DC | PRN
Start: 2017-07-11 — End: 2017-07-12
  Administered 2017-07-11: 3 mg via ORAL
  Filled 2017-07-11: qty 9

## 2017-07-11 MED ORDER — INSULIN ASPART 100 UNIT/ML ~~LOC~~ SOLN
0.0000 [IU] | Freq: Every day | SUBCUTANEOUS | Status: DC
  Administered 2017-07-11: 4 [IU] via SUBCUTANEOUS

## 2017-07-11 MED ORDER — LIDOCAINE HCL (CARDIAC) 20 MG/ML IV SOLN
INTRAVENOUS | Status: DC | PRN
  Administered 2017-07-11: 60 mg via INTRAVENOUS

## 2017-07-11 MED ORDER — HYDROMORPHONE HCL 1 MG/ML IJ SOLN: 0.5000 mg | INTRAMUSCULAR | Status: DC | PRN

## 2017-07-11 MED ORDER — SODIUM CHLORIDE 0.9% FLUSH
3.0000 mL | Freq: Two times a day (BID) | INTRAVENOUS | Status: DC
  Administered 2017-07-11: 3 mL via INTRAVENOUS

## 2017-07-11 MED ORDER — GABAPENTIN 300 MG PO CAPS: 300.0000 mg | ORAL_CAPSULE | Freq: Every evening | ORAL | Status: DC | PRN

## 2017-07-11 MED ORDER — CYCLOBENZAPRINE HCL 10 MG PO TABS
10.0000 mg | ORAL_TABLET | Freq: Three times a day (TID) | ORAL | Status: DC | PRN
  Administered 2017-07-11 – 2017-07-12 (×2): 10 mg via ORAL
  Filled 2017-07-11 (×2): qty 1

## 2017-07-11 MED ORDER — PRAVASTATIN SODIUM 10 MG PO TABS
10.0000 mg | ORAL_TABLET | Freq: Every day | ORAL | Status: DC
  Administered 2017-07-11: 10 mg via ORAL
  Filled 2017-07-11: qty 1

## 2017-07-11 MED ORDER — ALUM & MAG HYDROXIDE-SIMETH 200-200-20 MG/5ML PO SUSP: 30.0000 mL | Freq: Four times a day (QID) | ORAL | Status: DC | PRN

## 2017-07-11 MED ORDER — HYDROMORPHONE HCL 1 MG/ML IJ SOLN
INTRAMUSCULAR | Status: AC
  Administered 2017-07-11: 0.5 mg via INTRAVENOUS
  Filled 2017-07-11: qty 1

## 2017-07-11 MED ORDER — THROMBIN 5000 UNITS EX SOLR
CUTANEOUS | Status: DC | PRN
  Administered 2017-07-11 (×2): 5000 [IU] via TOPICAL

## 2017-07-11 MED ORDER — OXYCODONE-ACETAMINOPHEN 5-325 MG PO TABS: 1.0000 | ORAL_TABLET | Freq: Four times a day (QID) | ORAL | Status: DC | PRN

## 2017-07-11 MED ORDER — PHENYLEPHRINE 40 MCG/ML (10ML) SYRINGE FOR IV PUSH (FOR BLOOD PRESSURE SUPPORT)
PREFILLED_SYRINGE | INTRAVENOUS | Status: AC
  Filled 2017-07-11: qty 10

## 2017-07-11 MED ORDER — THROMBIN 5000 UNITS EX SOLR
OROMUCOSAL | Status: DC | PRN
  Administered 2017-07-11: 5 mL via TOPICAL

## 2017-07-11 MED ORDER — DEXAMETHASONE SODIUM PHOSPHATE 10 MG/ML IJ SOLN
10.0000 mg | INTRAMUSCULAR | Status: AC
  Administered 2017-07-11: 10 mg via INTRAVENOUS

## 2017-07-11 MED ORDER — ONDANSETRON HCL 4 MG/2ML IJ SOLN: 4.0000 mg | Freq: Four times a day (QID) | INTRAMUSCULAR | Status: DC | PRN

## 2017-07-11 MED ORDER — ONDANSETRON HCL 4 MG/2ML IJ SOLN
INTRAMUSCULAR | Status: DC | PRN
  Administered 2017-07-11: 4 mg via INTRAVENOUS

## 2017-07-11 MED ORDER — ROCURONIUM BROMIDE 10 MG/ML (PF) SYRINGE
PREFILLED_SYRINGE | INTRAVENOUS | Status: AC
  Filled 2017-07-11: qty 5

## 2017-07-11 MED ORDER — PANTOPRAZOLE SODIUM 40 MG IV SOLR
40.0000 mg | Freq: Every day | INTRAVENOUS | Status: DC
  Administered 2017-07-11: 40 mg via INTRAVENOUS
  Filled 2017-07-11: qty 40

## 2017-07-11 MED ORDER — CEFAZOLIN SODIUM-DEXTROSE 2-4 GM/100ML-% IV SOLN
INTRAVENOUS | Status: AC
  Filled 2017-07-11: qty 100

## 2017-07-11 MED ORDER — GLYCOPYRROLATE 0.2 MG/ML IJ SOLN
INTRAMUSCULAR | Status: DC | PRN
  Administered 2017-07-11: 0.2 mg via INTRAVENOUS

## 2017-07-11 MED ORDER — LIDOCAINE 2% (20 MG/ML) 5 ML SYRINGE
INTRAMUSCULAR | Status: AC
  Filled 2017-07-11: qty 5

## 2017-07-11 MED ORDER — ACETAMINOPHEN 650 MG RE SUPP
650.0000 mg | RECTAL | Status: DC | PRN
Start: 2017-07-11 — End: 2017-07-12

## 2017-07-11 MED ORDER — INSULIN ASPART 100 UNIT/ML ~~LOC~~ SOLN
0.0000 [IU] | Freq: Three times a day (TID) | SUBCUTANEOUS | Status: DC
  Administered 2017-07-12: 3 [IU] via SUBCUTANEOUS

## 2017-07-11 SURGICAL SUPPLY — 64 items
ADH SKN CLS APL DERMABOND .7 (GAUZE/BANDAGES/DRESSINGS) ×1
APL SKNCLS STERI-STRIP NONHPOA (GAUZE/BANDAGES/DRESSINGS) ×1
BAG DECANTER FOR FLEXI CONT (MISCELLANEOUS) ×2 IMPLANT
BENZOIN TINCTURE PRP APPL 2/3 (GAUZE/BANDAGES/DRESSINGS) ×2 IMPLANT
BIT DRILL SPINE QC 14 (BIT) ×1 IMPLANT
BUR MATCHSTICK NEURO 3.0 LAGG (BURR) ×2 IMPLANT
CANISTER SUCT 3000ML PPV (MISCELLANEOUS) ×2 IMPLANT
CARTRIDGE OIL MAESTRO DRILL (MISCELLANEOUS) ×1 IMPLANT
DERMABOND ADVANCED (GAUZE/BANDAGES/DRESSINGS) ×1
DERMABOND ADVANCED .7 DNX12 (GAUZE/BANDAGES/DRESSINGS) IMPLANT
DIFFUSER DRILL AIR PNEUMATIC (MISCELLANEOUS) ×2 IMPLANT
DRAPE C-ARM 42X72 X-RAY (DRAPES) ×4 IMPLANT
DRAPE LAPAROTOMY 100X72 PEDS (DRAPES) ×2 IMPLANT
DRAPE MICROSCOPE LEICA (MISCELLANEOUS) ×2 IMPLANT
DRAPE POUCH INSTRU U-SHP 10X18 (DRAPES) ×2 IMPLANT
DRSG OPSITE POSTOP 4X6 (GAUZE/BANDAGES/DRESSINGS) ×1 IMPLANT
DURAPREP 6ML APPLICATOR 50/CS (WOUND CARE) ×2 IMPLANT
ELECT COATED BLADE 2.86 ST (ELECTRODE) ×2 IMPLANT
ELECT REM PT RETURN 9FT ADLT (ELECTROSURGICAL) ×2
ELECTRODE REM PT RTRN 9FT ADLT (ELECTROSURGICAL) ×1 IMPLANT
GAUZE SPONGE 4X4 12PLY STRL (GAUZE/BANDAGES/DRESSINGS) ×2 IMPLANT
GAUZE SPONGE 4X4 16PLY XRAY LF (GAUZE/BANDAGES/DRESSINGS) IMPLANT
GLOVE BIO SURGEON STRL SZ7 (GLOVE) IMPLANT
GLOVE BIO SURGEON STRL SZ8 (GLOVE) ×4 IMPLANT
GLOVE BIOGEL PI IND STRL 7.0 (GLOVE) IMPLANT
GLOVE BIOGEL PI IND STRL 8.5 (GLOVE) IMPLANT
GLOVE BIOGEL PI INDICATOR 7.0 (GLOVE) ×1
GLOVE BIOGEL PI INDICATOR 8.5 (GLOVE) ×1
GLOVE EXAM NITRILE LRG STRL (GLOVE) IMPLANT
GLOVE EXAM NITRILE XL STR (GLOVE) IMPLANT
GLOVE EXAM NITRILE XS STR PU (GLOVE) IMPLANT
GLOVE INDICATOR 8.5 STRL (GLOVE) ×2 IMPLANT
GOWN STRL REUS W/ TWL LRG LVL3 (GOWN DISPOSABLE) ×1 IMPLANT
GOWN STRL REUS W/ TWL XL LVL3 (GOWN DISPOSABLE) ×1 IMPLANT
GOWN STRL REUS W/TWL 2XL LVL3 (GOWN DISPOSABLE) ×1 IMPLANT
GOWN STRL REUS W/TWL LRG LVL3 (GOWN DISPOSABLE) ×2
GOWN STRL REUS W/TWL XL LVL3 (GOWN DISPOSABLE) ×4
HALTER HD/CHIN CERV TRACTION D (MISCELLANEOUS) ×2 IMPLANT
HEMOSTAT POWDER KIT SURGIFOAM (HEMOSTASIS) ×2 IMPLANT
KIT BASIN OR (CUSTOM PROCEDURE TRAY) ×2 IMPLANT
KIT ROOM TURNOVER OR (KITS) ×2 IMPLANT
NDL HYPO 18GX1.5 BLUNT FILL (NEEDLE) ×1 IMPLANT
NDL SPNL 20GX3.5 QUINCKE YW (NEEDLE) ×1 IMPLANT
NEEDLE HYPO 18GX1.5 BLUNT FILL (NEEDLE) ×2 IMPLANT
NEEDLE SPNL 20GX3.5 QUINCKE YW (NEEDLE) ×2 IMPLANT
NS IRRIG 1000ML POUR BTL (IV SOLUTION) ×2 IMPLANT
OIL CARTRIDGE MAESTRO DRILL (MISCELLANEOUS) ×2
PACK LAMINECTOMY NEURO (CUSTOM PROCEDURE TRAY) ×2 IMPLANT
PAD ARMBOARD 7.5X6 YLW CONV (MISCELLANEOUS) ×6 IMPLANT
PLATE ANT CERV XTEND 1 LV 14 (Plate) ×1 IMPLANT
PUTTY BONE DBX 2.5 MIS (Bone Implant) ×1 IMPLANT
RUBBERBAND STERILE (MISCELLANEOUS) ×4 IMPLANT
SCREW XTD VAR 4.2 SELF TAP (Screw) ×4 IMPLANT
SPACER CERVICAL 11X14MM 8MM 0D (Spacer) ×1 IMPLANT
SPONGE INTESTINAL PEANUT (DISPOSABLE) ×2 IMPLANT
SPONGE SURGIFOAM ABS GEL SZ50 (HEMOSTASIS) ×2 IMPLANT
STRIP CLOSURE SKIN 1/2X4 (GAUZE/BANDAGES/DRESSINGS) ×2 IMPLANT
SUT VIC AB 3-0 SH 8-18 (SUTURE) ×2 IMPLANT
SUT VICRYL 4-0 PS2 18IN ABS (SUTURE) ×2 IMPLANT
TAPE CLOTH 4X10 WHT NS (GAUZE/BANDAGES/DRESSINGS) IMPLANT
TOWEL GREEN STERILE (TOWEL DISPOSABLE) ×2 IMPLANT
TOWEL GREEN STERILE FF (TOWEL DISPOSABLE) ×2 IMPLANT
TRAP SPECIMEN MUCOUS 40CC (MISCELLANEOUS) ×2 IMPLANT
WATER STERILE IRR 1000ML POUR (IV SOLUTION) ×2 IMPLANT

## 2017-07-11 NOTE — H&P (Signed)
Joshua [name] is an 81 y.o. male.   Chief Complaint: Neck pain numbness tingling hands HPI: 81 year old gentleman who sustained a fall and a neck injury resulting in severe spinal cord compression with signal change within his cord behind C3-4. Patient's parents numbness tingling his hands and occasional weakness in his hands pain is resolved. But due to patient's clinical exam consistent with myelopathy severe spinal cord compression I recommended a ACDF at C3-4. I've extensively reviewed the risks and benefits of the operation with the patient as well as perioperative course expectations of outcome and alternatives of surgery and he understands and agrees to proceed forward.  Past Medical History:  Diagnosis Date  . Anemia    hx.  . Arthritis   . Diabetes mellitus without complication (HCC)    pt.states that he does not ever check blood sugars at home  . Headache   . High cholesterol   . History of kidney stones   . Hypertension   . Pneumonia    years ago    Past Surgical History:  Procedure Laterality Date  . ANKLE FRACTURE SURGERY Left    when [age]- years old  . FRACTURE SURGERY      fx. lower left leg at age 79-13    History reviewed. No pertinent family history. Social History:  reports that he quit smoking about a year ago. His smoking use included Cigarettes. He has a 120.00 pack-year smoking history. He has never used smokeless tobacco. He reports that he does not drink alcohol or use drugs.  Allergies: No Known Allergies  Medications Prior to Admission  Medication Sig Dispense Refill  . allopurinol (ZYLOPRIM) 300 MG tablet Take 300 mg by mouth 2 (two) times daily.    Marland Kitchen. amLODipine (NORVASC) 10 MG tablet Take 10 mg by mouth daily.    . fluticasone (FLONASE) 50 MCG/ACT nasal spray Place 2 sprays into both nostrils at bedtime.    . gabapentin (NEURONTIN) 300 MG capsule Take 300-600 mg by mouth at bedtime as needed (Pain).     Marland Kitchen. lisinopril (PRINIVIL,ZESTRIL) 40 MG tablet  Take 40 mg by mouth daily.    Marland Kitchen. lovastatin (MEVACOR) 40 MG tablet Take 40 mg by mouth at bedtime.     . metFORMIN (GLUCOPHAGE) 500 MG tablet Take 1,000 mg by mouth daily with breakfast.     . metoprolol (LOPRESSOR) 100 MG tablet Take 100 mg by mouth daily.    Marland Kitchen. oxyCODONE-acetaminophen (PERCOCET/ROXICET) 5-325 MG tablet Take 1 tablet by mouth every 6 (six) hours as needed for severe pain. 20 tablet 0    Results for orders placed or performed during the hospital encounter of 07/11/17 (from the past 48 hour(s))  Glucose, capillary     Status: Abnormal   Collection Time: 07/11/17  8:04 AM  Result Value Ref Range   Glucose-Capillary 166 (H) 65 - 99 mg/dL   No results found.  Review of Systems  Musculoskeletal: Positive for neck pain.  Neurological: Positive for tingling and sensory change.    Blood pressure (!) 160/60, pulse (!) 57, temperature 98.2 F (36.8 C), temperature source Oral, resp. rate 18, height 6' (1.829 m), weight 91.5 kg (201 lb 12.8 oz), SpO2 96 %. Physical Exam  Constitutional: He is oriented to person, place, and time. He appears well-developed.  HENT:  Head: Normocephalic.  Eyes: Pupils are equal, round, and reactive to light.  Neck: Normal range of motion.  Respiratory: Effort normal.  GI: Soft.  Neurological: He is alert and oriented  to person, place, and time. He has normal strength. GCS eye subscore is 4. GCS verbal subscore is 5. GCS motor subscore is 6.  Strength out of 5 deltoid, bicep, tricep, wrist flexion, wrist extension, hand intrinsics.     Assessment/Plan 81 year old gentleman presents for an ACDF at C3-4.  Adalena Abdulla P, MD 07/11/2017, 9:09 AM

## 2017-07-11 NOTE — Anesthesia Procedure Notes (Signed)
Procedure Name: Intubation Date/Time: 07/11/2017 9:54 AM Performed by: Lavell Luster Pre-anesthesia Checklist: Patient identified, Suction available, Emergency Drugs available, Patient being monitored and Timeout performed Patient Re-evaluated:Patient Re-evaluated prior to induction Oxygen Delivery Method: Circle system utilized Preoxygenation: Pre-oxygenation with 100% oxygen Induction Type: IV induction Ventilation: Mask ventilation without difficulty Laryngoscope Size: Mac and 4 Grade View: Grade I Tube type: Oral Tube size: 7.5 mm Number of attempts: 1 Airway Equipment and Method: Stylet Placement Confirmation: ETT inserted through vocal cords under direct vision,  breath sounds checked- equal and bilateral and positive ETCO2 Secured at: 22 cm Tube secured with: Tape Dental Injury: Teeth and Oropharynx as per pre-operative assessment

## 2017-07-11 NOTE — Transfer of Care (Signed)
Immediate Anesthesia Transfer of Care Note  Patient: Joshua [name]  Procedure(s) Performed: Procedure(s): Anterior Cervical Decompression/Discectomy Fusion  - Cervical three-four (N/A)  Patient Location: PACU  Anesthesia Type:General  Level of Consciousness: awake, alert  and sedated  Airway & Oxygen Therapy: Patient connected to face mask oxygen  Post-op Assessment: Post -op Vital signs reviewed and stable  Post vital signs: stable  Last Vitals:  Vitals:   07/11/17 0802 07/11/17 1155  BP: (!) 160/60   Pulse: (!) 57   Resp: 18   Temp: 36.8 C (!) (P) 36.2 C    Last Pain:  Vitals:   07/11/17 1155  TempSrc:   PainSc: (P) Asleep         Complications: No apparent anesthesia complications

## 2017-07-11 NOTE — Anesthesia Postprocedure Evaluation (Signed)
Anesthesia Post Note  Patient: Ruthe MannanLarry D Tabak  Procedure(s) Performed: Procedure(s) (LRB): Anterior Cervical Decompression/Discectomy Fusion  - Cervical three-four (N/A)     Patient location during evaluation: PACU Anesthesia Type: General Level of consciousness: awake and alert Pain management: pain level controlled Vital Signs Assessment: post-procedure vital signs reviewed and stable Respiratory status: spontaneous breathing, nonlabored ventilation and respiratory function stable Cardiovascular status: blood pressure returned to baseline and stable Postop Assessment: no signs of nausea or vomiting Anesthetic complications: no    Last Vitals:  Vitals:   07/11/17 1355 07/11/17 1431  BP: 140/63 (!) 159/91  Pulse: 61 70  Resp: 15 20  Temp: 36.7 C 36.5 C    Last Pain:  Vitals:   07/11/17 1431  TempSrc: Oral  PainSc:                  Katalena Malveaux,W. EDMOND

## 2017-07-11 NOTE — Op Note (Signed)
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  Postoperative diagnosis: Same  Procedure: Anterior cervical discectomy and fusion at C3-4 utilizing allograft wedge and globus extend plating system.  Surgeon: Jillyn HiddenGary Kongmeng Santoro  Asst.: Maeola HarmanJoseph Stern  Anesthesia: Gen.  EBL: Minimal  History of present illness: Patient is 81 year old gentleman who sustained a neck injury with numbness tingling his hands and fingers and MRI scan showing severe spinal stenosis spinal cord compression herniated nuclear stenosis and signal change within his cord. Due to patient progressive clinical syndrome imaging findings and failure conservative treatment I recommended anterior cervical discectomy and fusion. I extensively reviewed the risks and benefits of the operation the patient as well as perioperative course expectations of outcome and alternatives of surgery and he understood and agreed to proceed forward.  Operative procedure: Patient brought into the or was induced on general anesthesia positioned supine the neck in slight extension 5 pounds of halter traction the right side his neck was prepped and draped in routine sterile fashion preoperative x-ray localize the appropriate level. So then curvilinear incision was made just off midline to the anteversion of the sternomastoid and the superficial layer of the platysmas dissected and divided longitudinally the avascular plane to sternomastoid and strap muscles was developed on the previous fashion. Fascia dissected with Kitners. Interoperative x-ray confirmed the location proper level so longus was reflected laterally and self-retaining retractors placed. Disc spaces markedly calcified anteriorly however I found a placed anterior and then removed the calcified disc removed the remainder the disc with backward angle curettes and a high-speed drill under microscopic illumination the posterior annulus  facet complex was drilled down a high-speed drill under biting of the endplates allowed indication posterior longitudinal ligament which was removed in piecemeal fashion. Identified the thecal sac marking laterally I skeletonized the left C4 nerve root and identified left C4 pedicle. March into the right several large free fragments disc and migrated laterally within the canal at the proximal takeoff of the C4 nerve root. These were all teased away with a nerve hook and I opened up the C4 foramen decompressing central canal by aggressively under biting both endplates. After both pedicles were palpated and I confirmed adequate decompression of this thecal sac and both C4 nerve roots I then prepared the endplates and inserted an 8 mm parallel allograft. Then a globus plate was sized optic and placed 14 variable screws are placed all screws excellent purchase posterior fluoroscopy confirmed good position of all the implants the wounds and go see her good fixing space was maintained the wounds closed in layers with interrupted Vicryl and a running 4 septic or and skin Dermabond benzo and Steri-Strips and sterile dressings applied patient recovered in stable condition. At the end of case all needle counts sponge counts were correct.

## 2017-07-12 ENCOUNTER — Encounter (HOSPITAL_COMMUNITY): Payer: Self-pay | Admitting: Neurosurgery

## 2017-07-12 DIAGNOSIS — M5001 Cervical disc disorder with myelopathy,  high cervical region: Secondary | ICD-10-CM | POA: Diagnosis not present

## 2017-07-12 LAB — GLUCOSE, CAPILLARY: Glucose-Capillary: 181 mg/dL — ABNORMAL HIGH (ref 65–99)

## 2017-07-12 MED ORDER — OXYCODONE-ACETAMINOPHEN 5-325 MG PO TABS: 1.0000 | ORAL_TABLET | Freq: Four times a day (QID) | ORAL | 0 refills | Status: DC | PRN

## 2017-07-12 MED ORDER — LIVING WELL WITH DIABETES BOOK
Freq: Once | Status: AC
  Administered 2017-07-12: 10:00:00
  Filled 2017-07-12 (×2): qty 1

## 2017-07-12 NOTE — Care Management Obs Status (Signed)
MEDICARE OBSERVATION STATUS NOTIFICATION   Patient Details  Name: Joshua MannanLarry D Rohwer MRN: [id number] Date of Birth: Dec 18, 1935   Medicare Observation Status Notification Given:  Yes    Durenda GuthrieBrady, Stepfanie Yott Naomi, RN 07/12/2017, 8:24 AM

## 2017-07-12 NOTE — Care Management CC44 (Signed)
Condition Code 44 Documentation Completed  Patient Details  Name: Joshua [name] MRN: [id number] Date of Birth: 13-Jul-1936   Condition Code 44 given:  Yes Patient signature on Condition Code 44 notice:  Yes Documentation of 2 MD's agreement:  Yes Code 44 added to claim:  Yes    Durenda GuthrieBrady, Kenyatte Gruber Naomi, RN 07/12/2017, 8:24 AM

## 2017-07-12 NOTE — Discharge Summary (Signed)
Physician Discharge Summary  Patient ID: Joshua [name] MRN: [id number] DOB/AGE: [birth year] 81 y.o.  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   Discharge Diagnoses: same as admitting   Discharged Condition: good  Hospital Course: The patient was admitted on 07/11/2017 and taken to the operating room where the patient underwent Anterior cervical discectomy and fusion at C3-4  . The patient tolerated the procedure well and was taken to the recovery room and then to the room in stable condition. The hospital course was routine. There were no complications. The wound remained clean dry and intact. Pt had appropriate neck soreness. No complaints of new pain or new N/T/W. The patient remained afebrile with stable vital signs, and tolerated a regular diet. The patient continued to increase activities, and pain was well controlled with oral pain medications.   Consults: None  Significant Diagnostic Studies:  Results for orders placed or performed during the hospital encounter of 07/11/17  Glucose, capillary  Result Value Ref Range   Glucose-Capillary 166 (H) 65 - 99 mg/dL  Glucose, capillary  Result Value Ref Range   Glucose-Capillary 170 (H) 65 - 99 mg/dL  Glucose, capillary  Result Value Ref Range   Glucose-Capillary 245 (H) 65 - 99 mg/dL  Glucose, capillary  Result Value Ref Range   Glucose-Capillary 350 (H) 65 - 99 mg/dL   Comment 1 Notify RN    Comment 2 Document in Chart   Glucose, capillary  Result Value Ref Range   Glucose-Capillary 181 (H) 65 - 99 mg/dL    Dg Cervical Spine 1 View  Result Date: 07/11/2017 CLINICAL DATA:  Status post surgical anterior fusion of C3-4. EXAM: DG C-ARM 61-120 MIN; DG CERVICAL SPINE - 1 VIEW FLUOROSCOPY TIME:  5 seconds. COMPARISON:  MRI of May 08, 2017. FINDINGS: Single intraoperative fluoroscopic image of the cervical  spine demonstrates the patient to be status post surgical anterior fusion of C3-4 with spacer device. Good alignment of vertebral bodies is noted. IMPRESSION: Status post surgical anterior fusion of C3-4. Electronically Signed   By: Lupita RaiderJames  Green Jr, M.D.   On: 07/11/2017 11:59   Dg C-arm 1-60 Min  Result Date: 07/11/2017 CLINICAL DATA:  Status post surgical anterior fusion of C3-4. EXAM: DG C-ARM 61-120 MIN; DG CERVICAL SPINE - 1 VIEW FLUOROSCOPY TIME:  5 seconds. COMPARISON:  MRI of May 08, 2017. FINDINGS: Single intraoperative fluoroscopic image of the cervical spine demonstrates the patient to be status post surgical anterior fusion of C3-4 with spacer device. Good alignment of vertebral bodies is noted. IMPRESSION: Status post surgical anterior fusion of C3-4. Electronically Signed   By: Lupita RaiderJames  Green Jr, M.D.   On: 07/11/2017 11:59    Antibiotics:  Anti-infectives    Start     Dose/Rate Route Frequency Ordered Stop   07/11/17 1515  ceFAZolin (ANCEF) IVPB 2g/100 mL premix     2 g 200 mL/hr over 30 Minutes Intravenous Every 8 hours 07/11/17 1504 07/11/17 2345   07/11/17 1044  bacitracin 50,000 Units in sodium chloride irrigation 0.9 % 500 mL irrigation  Status:  Discontinued       As needed 07/11/17 1044 07/11/17 1149   07/11/17 0736  ceFAZolin (ANCEF) 2-4 GM/100ML-% IVPB    Comments:  Dia CrawfordGallman, Kathie   : cabinet override      07/11/17 0736 07/11/17 1007   07/11/17 0734  ceFAZolin (ANCEF) IVPB 2g/100 mL premix  2 g 200 mL/hr over 30 Minutes Intravenous On call to O.R. 07/11/17 0734 07/11/17 1022      Discharge Exam: Blood pressure (!) 157/78, pulse 74, temperature 98.3 F (36.8 C), temperature source Oral, resp. rate 20, height 6' (1.829 m), weight 91.2 kg (201 lb), SpO2 95 %. Neurologic: Grossly normal MAE, ambulating and voiding well. Appropriate neck soreness and swallowing.   Discharge Medications:   Allergies as of 07/12/2017   No Known Allergies     Medication List     TAKE these medications   allopurinol 300 MG tablet Commonly known as:  ZYLOPRIM Take 300 mg by mouth 2 (two) times daily.   amLODipine 10 MG tablet Commonly known as:  NORVASC Take 10 mg by mouth daily.   fluticasone 50 MCG/ACT nasal spray Commonly known as:  FLONASE Place 2 sprays into both nostrils at bedtime.   gabapentin 300 MG capsule Commonly known as:  NEURONTIN Take 300-600 mg by mouth at bedtime as needed (Pain).   lisinopril 40 MG tablet Commonly known as:  PRINIVIL,ZESTRIL Take 40 mg by mouth daily.   lovastatin 40 MG tablet Commonly known as:  MEVACOR Take 40 mg by mouth at bedtime.   metFORMIN 500 MG tablet Commonly known as:  GLUCOPHAGE Take 1,000 mg by mouth daily with breakfast.   metoprolol tartrate 100 MG tablet Commonly known as:  LOPRESSOR Take 100 mg by mouth daily.   oxyCODONE-acetaminophen 5-325 MG tablet Commonly known as:  PERCOCET/ROXICET Take 1 tablet by mouth every 6 (six) hours as needed for severe pain. What changed:  Another medication with the same name was added. Make sure you understand how and when to take each.   oxyCODONE-acetaminophen 5-325 MG tablet Commonly known as:  PERCOCET/ROXICET Take 1 tablet by mouth every 6 (six) hours as needed for severe pain. What changed:  You were already taking a medication with the same name, and this prescription was added. Make sure you understand how and when to take each.       Disposition: home   Final Dx: same as admitting  Discharge Instructions    Call MD for:  difficulty breathing, headache or visual disturbances    Complete by:  As directed    Call MD for:  extreme fatigue    Complete by:  As directed    Call MD for:  hives    Complete by:  As directed    Call MD for:  persistant dizziness or light-headedness    Complete by:  As directed    Call MD for:  persistant nausea and vomiting    Complete by:  As directed    Call MD for:  redness, tenderness, or signs of infection  (pain, swelling, redness, odor or green/yellow discharge around incision site)    Complete by:  As directed    Call MD for:  severe uncontrolled pain    Complete by:  As directed    Call MD for:  temperature >100.4    Complete by:  As directed    Diet - low sodium heart healthy    Complete by:  As directed    Driving Restrictions    Complete by:  As directed    No driving for 2 weeks   Increase activity slowly    Complete by:  As directed          Signed: Tiana LoftKimberly Hannah Khaidyn Staebell 07/12/2017, 7:59 AM

## 2017-07-12 NOTE — Discharge Instructions (Signed)

## 2017-07-12 NOTE — Progress Notes (Signed)
Patient is discharged from room 3C09 at this time. Alert and in stable condition. IV site d/c'd and instructions read to patient and family with understanding verbalized. Left unit via wheelchair with all belongings at side. 

## 2017-07-12 NOTE — Progress Notes (Signed)
Inpatient Diabetes Program Recommendations  AACE/ADA: New Consensus Statement on Inpatient Glycemic Control (2015)  Target Ranges:  Prepandial:   less than 140 mg/dL      Peak postprandial:   less than 180 mg/dL (1-2 hours)      Critically ill patients:  140 - 180 mg/dL   Lab Results  Component Value Date   GLUCAP 181 (H) 07/12/2017   HGBA1C 7.5 (H) 07/02/2017    Received consult to speak to patient and family regarding diabetes management. Met with patient,wife, and daughter @ bedside. Wife states patient eats a lot of sweets @ night and eats 3-4 sausage biscuits in the mornings. Reviewed information regarding plate method nutrition and A1c nutrition information. Patient has received Living Well with Diabetes Booklet and agrees for he and family to read @ home since he is being D/C'd today.Wife and daughter also appreciate information.  Thank you, Nani Gasser. Merritt Mccravy, RN, MSN, CDE  Diabetes Coordinator Inpatient Glycemic Control Team Team Pager [phone] (8am-5pm) 07/12/2017 10:32 AM

## 2017-10-10 ENCOUNTER — Ambulatory Visit: Payer: Self-pay | Admitting: Orthopedic Surgery

## 2017-10-11 ENCOUNTER — Ambulatory Visit
Admission: RE | Admit: 2017-10-11 | Discharge: 2017-10-11 | Disposition: A | Discharge status: Home / Self Care | Payer: Medicare Other | Source: Ambulatory Visit | Attending: Family Medicine | Admitting: Family Medicine

## 2017-10-11 ENCOUNTER — Other Ambulatory Visit: Payer: Self-pay | Admitting: Family Medicine

## 2017-10-11 DIAGNOSIS — Z01818 Encounter for other preprocedural examination: Secondary | ICD-10-CM

## 2017-10-18 NOTE — Progress Notes (Signed)
[facility] - Clinic Note  10/22/2017     CHIEF COMPLAINT Patient presents for Retina Evaluation and Diabetic Eye Exam   HISTORY OF PRESENT ILLNESS: Joshua [name] is a 81 y.o. male who presents to the clinic today for:   HPI    Retina Evaluation    In both eyes.  This started 15 years ago.  Duration of 15 years.  Associated Symptoms Floaters.  Negative for Distortion, Photophobia, Blind Spot, Glare, Shoulder/Hip pain, Fatigue, Jaw Claudication, Redness, Scalp Tenderness, Weight Loss, Pain, Trauma, Fever and Flashes.  Context:  distance vision, mid-range vision and near vision.  Treatments tried include eye drops.  Response to treatment was no improvement.  I, the attending physician,  performed the HPI with the patient and updated documentation appropriately.          Diabetic Eye Exam    Vision is blurred for near and is blurred for distance.  Associated Symptoms Floaters.  Negative for Flashes, Distortion, Photophobia, Pain, Trauma, Jaw Claudication, Fever, Fatigue, Blind Spot, Redness, Glare, Scalp Tenderness, Shoulder/Hip pain and Weight Loss.  This started years ago.  Blood sugar level is controlled.  I, the attending physician,  performed the HPI with the patient and updated documentation appropriately.          Comments    Referral of DR. Bernstrof for ARMD OU/ DM OU.Patient states he had cataract sx ou 15-20 years ago. He reports he developed floaters after the sx.He reports some eye soreness upon awaking in the morning. He reports he does not monitor CBG but his "blood sugars are mostly good". He does recall A1C. He uses systane eye gtts only if he remembers . Denies Vits        Last edited by Rennis Chris, MD on 10/23/2017 10:20 AM. (History)      Referring physician: Francene Boyers, MD [address] [place], [place] [address]  HISTORICAL INFORMATION:   Selected notes from the MEDICAL RECORD NUMBER Referral from Dr. Eugene Garnet for  concern of ARMD OU;  LEE- 11.6.18 (Dr. Eugene Garnet) Ocular Hx- pseudophakia OU (Surgeon: Eps), DES OU PMH- elevated chol., DM, HTN   CURRENT MEDICATIONS: Current Outpatient Medications (Ophthalmic Drugs)  Medication Sig  . Polyethyl Glycol-Propyl Glycol (SYSTANE OP) Apply to eye.  . prednisoLONE acetate (PRED FORTE) 1 % ophthalmic suspension Place 1 drop 4 (four) times daily into the left eye.   No current facility-administered medications for this visit.  (Ophthalmic Drugs)   Current Outpatient Medications (Other)  Medication Sig  . allopurinol (ZYLOPRIM) 300 MG tablet Take 300 mg by mouth 2 (two) times daily.  Marland Kitchen amLODipine (NORVASC) 10 MG tablet Take 10 mg by mouth daily.  . fluticasone (FLONASE) 50 MCG/ACT nasal spray Place 2 sprays into both nostrils at bedtime.  . gabapentin (NEURONTIN) 300 MG capsule Take 300-600 mg by mouth at bedtime as needed (Pain).   Marland Kitchen lisinopril (PRINIVIL,ZESTRIL) 40 MG tablet Take 40 mg by mouth daily.  Marland Kitchen lovastatin (MEVACOR) 40 MG tablet Take 40 mg by mouth at bedtime.   . metFORMIN (GLUCOPHAGE) 500 MG tablet Take 1,000 mg by mouth daily with breakfast.   . metoprolol (LOPRESSOR) 100 MG tablet Take 100 mg by mouth daily.  Marland Kitchen oxyCODONE-acetaminophen (PERCOCET/ROXICET) 5-325 MG tablet Take 1 tablet by mouth every 6 (six) hours as needed for severe pain.  Marland Kitchen oxyCODONE-acetaminophen (PERCOCET/ROXICET) 5-325 MG tablet Take 1 tablet by mouth every 6 (six) hours as needed for severe pain.   No  current facility-administered medications for this visit.  (Other)      REVIEW OF SYSTEMS: ROS    Positive for: Musculoskeletal, Endocrine, Eyes, Allergic/Imm   Negative for: Constitutional, Gastrointestinal, Neurological, Skin, Genitourinary, HENT, Cardiovascular, Respiratory, Psychiatric, Heme/Lymph   Last edited by Eldridge ScotKendrick, Glenda, LPN on 16/10/960411/13/2018  2:13 PM. (History)       ALLERGIES No Known Allergies  PAST MEDICAL HISTORY Past Medical History:  Diagnosis  Date  . Anemia    hx.  . Arthritis   . Diabetes mellitus without complication (HCC)    pt.states that he does not ever check blood sugars at home  . Headache   . High cholesterol   . History of kidney stones   . Hypertension   . Pneumonia    years ago   Past Surgical History:  Procedure Laterality Date  . ANKLE FRACTURE SURGERY Left    when [age]- years old  . CATARACT EXTRACTION Bilateral   . FRACTURE SURGERY      fx. lower left leg at age 79-13    FAMILY HISTORY Family History  Problem Relation Age of Onset  . Strabismus Daughter     SOCIAL HISTORY Social History   Tobacco Use  . Smoking status: Former Smoker    Packs/day: 2.00    Years: 60.00    Pack years: 120.00    Types: Cigarettes    Last attempt to quit: 07/10/2016    Years since quitting: 1.2  . Smokeless tobacco: Never Used  Substance Use Topics  . Alcohol use: No  . Drug use: No         OPHTHALMIC EXAM:  Base Eye Exam    Visual Acuity (Snellen - Linear)      Right Left   Dist [place] 20/20 20/25 +2   Dist ph [place] NI NI       Tonometry (Tonopen, 2:32 PM)      Right Left   Pressure 17 13       Pupils      Dark Light Shape React APD   Right 3 2 Round Brisk None   Left 3 2 Round Brisk None       Visual Fields (Counting fingers)      Left Right    Full Full       Extraocular Movement      Right Left    Full, Ortho Full, Ortho       Neuro/Psych    Oriented x3:  Yes       Dilation    Both eyes:  1.0% Mydriacyl, 2.5% Phenylephrine @ 2:33 PM        Slit Lamp and Fundus Exam    Slit Lamp Exam      Right Left   Lids/Lashes Dermatochalasis - upper lid Dermatochalasis - upper lid   Conjunctiva/Sclera White and quiet White and quiet   Cornea subepithelial scarring para central, Arcus Round subepithelial scarring nasal para central, Arcus   Anterior Chamber Deep and quiet Deep and quiet   Iris Round and dialted Round and dialted   Lens Three piece Posterior chamber intraocular lens in  good position Three piece Posterior chamber intraocular lens in good position with open PC   Vitreous Vitreous syneresis, Posterior vitreous detachment Vitreous syneresis, Posterior vitreous detachment       Fundus Exam      Right Left   Disc Normal Normal   C/D Ratio 0.5 0.5   Macula Flat, Retinal pigment epithelial mottling, mild Drusen, no  heme or edema Flat, Drusen, no heme or edema   Vessels Normal Normal   Periphery attached, Superior pigmented lattice degeneration,  attached, Superior pigmented lattice degeneration with small atrophic holes        Refraction    Manifest Refraction (Auto)      Sphere Cylinder Axis Dist VA   Right -1.00 +0.75 019 20/20   Left -0.50 +0.75 151 20/25          IMAGING AND PROCEDURES  Imaging and Procedures for 10/23/17  OCT, Retina - OU - Both Eyes     Right Eye Quality was good. Central Foveal Thickness: 253. Progression has no prior data. Findings include normal foveal contour, no IRF, no SRF, retinal drusen .   Left Eye Quality was good. Central Foveal Thickness: 251. Progression has no prior data. Findings include normal foveal contour, no IRF, no SRF, retinal drusen .   Notes Images taken, stored on drive  Diagnosis / Impression:  Mild Non-exudative ARMD OU  Clinical management:  See below  Abbreviations: NFP - Normal foveal profile. CME - cystoid macular edema. PED - pigment epithelial detachment. IRF - intraretinal fluid. SRF - subretinal fluid. EZ - ellipsoid zone. ERM - epiretinal membrane. ORA - outer retinal atrophy. ORT - outer retinal tubulation. SRHM - subretinal hyper-reflective material         Repair Retinal Breaks, Laser - OS - Left Eye     LASER PROCEDURE NOTE  Procedure:  Barrier laser retinopexy using laser indirect ophthalmoscope, left eye   Diagnosis:   Lattice degeneration w/ atrophic holes, left eye                     Located at 1130-12 o'clock anterior to equator   Surgeon: Rennis ChrisBrian Jailey Booton, MD,  PhD  Anesthesia: Topical  Informed consent obtained, operative eye marked, and time out performed prior to initiation of laser.   Laser settings:  Lumenis [id number] laser indirect ophthalmoscope Power: 260 mW Duration: 70 msec  # spots: 458  Placement of laser: Laser was placed in three confluent rows around patch of lattice degeneration at 12 oclock anterior to equator with additional rows anteriorly.  Complications: None.  Patient tolerated the procedure well and received written and verbal post-procedure care information/education.                 ASSESSMENT/PLAN:    ICD-10-CM   1. Lattice degeneration of both retinas H35.413 Repair Retinal Breaks, Laser - OS - Left Eye  2. 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 Diabetes mellitus type 2 without retinopathy (HCC) E11.9   4. Retinal edema H35.81 OCT, Retina - OU - Both Eyes  5. Pseudophakia Z96.1     1. Lattice degeneration OU-   - pigmented lattice superiorly with atrophic holes  - no SRF  - discussed findings, prognosis, and possible treatment options including observation vs laser retinopexy  - pt wishes to proceed with laser retinopexy -- recommend OS first  - RBA of procedure discussed, questions answered  - informed consent obtained and signed  - see procedure note  - start PF QID OS x7 days  - f/u in 2 wks for recheck and likely laser retinopexy OD   2. Age related macular degeneration, non-exudative, both eyes -- early  - The incidence, anatomy, and pathology of dry AMD, risk of progression, and the AREDS and AREDS 2 study including smoking risks discussed with patient.  - Recommend  amsler grid monitoring  - will continue to monitor  3. DM2 w/o retinopathy The incidence, risk factors for progression, natural history and treatment options for diabetic retinopathy  were discussed with patient.  The need for close monitoring of blood glucose, blood pressure,  and serum lipids, avoiding cigarette or any type of tobacco, and the need for long term follow up was also discussed with patient.  4. No retinal edema or diabetic macular edema  5. Pseudophakia OU  - s/p CE/IOL OU  - beautiful surgery, doing well  - monitor   Ophthalmic Meds Ordered this visit:  Meds ordered this encounter  Medications  . prednisoLONE acetate (PRED FORTE) 1 % ophthalmic suspension    Sig: Place 1 drop 4 (four) times daily into the left eye.    Dispense:  10 mL    Refill:  0       Return in about 2 weeks (around 11/05/2017) for F/U Non Exu AMD, S/P laser retinopexy OD (11.13.18).  There are no Patient Instructions on file for this visit.   Explained the diagnoses, plan, and follow up with the patient and they expressed understanding.  Patient expressed understanding of the importance of proper follow up care.   Karie Chimera, M.D., Ph.D. Diseases & Surgery of the Retina and Vitreous [facility] 10/23/17     Abbreviations: M myopia (nearsighted); A astigmatism; H hyperopia (farsighted); P presbyopia; Mrx spectacle prescription;  CTL contact lenses; OD right eye; OS left eye; OU both eyes  XT exotropia; ET esotropia; PEK punctate epithelial keratitis; PEE punctate epithelial erosions; DES dry eye syndrome; MGD meibomian gland dysfunction; ATs artificial tears; PFAT's preservative free artificial tears; NSC nuclear sclerotic cataract; PSC posterior subcapsular cataract; ERM epi-retinal membrane; PVD posterior vitreous detachment; RD retinal detachment; DM diabetes mellitus; DR diabetic retinopathy; NPDR non-proliferative diabetic retinopathy; PDR proliferative diabetic retinopathy; CSME clinically significant macular edema; DME diabetic macular edema; dbh dot blot hemorrhages; CWS cotton wool spot; POAG primary open angle glaucoma; C/D cup-to-disc ratio; HVF humphrey visual field; GVF goldmann visual field; OCT optical coherence tomography;  IOP intraocular pressure; BRVO Branch retinal vein occlusion; CRVO central retinal vein occlusion; CRAO central retinal artery occlusion; BRAO branch retinal artery occlusion; RT retinal tear; SB scleral buckle; PPV pars plana vitrectomy; VH Vitreous hemorrhage; PRP panretinal laser photocoagulation; IVK intravitreal kenalog; VMT vitreomacular traction; MH Macular hole;  NVD neovascularization of the disc; NVE neovascularization elsewhere; AREDS age related eye disease study; ARMD age related macular degeneration; POAG primary open angle glaucoma; EBMD epithelial/anterior basement membrane dystrophy; ACIOL anterior chamber intraocular lens; IOL intraocular lens; PCIOL posterior chamber intraocular lens; Phaco/IOL phacoemulsification with intraocular lens placement; PRK photorefractive keratectomy; LASIK laser assisted in situ keratomileusis; HTN hypertension; DM diabetes mellitus; COPD chronic obstructive pulmonary disease

## 2017-10-22 ENCOUNTER — Encounter (INDEPENDENT_AMBULATORY_CARE_PROVIDER_SITE_OTHER): Payer: Self-pay | Admitting: Ophthalmology

## 2017-10-22 ENCOUNTER — Ambulatory Visit (INDEPENDENT_AMBULATORY_CARE_PROVIDER_SITE_OTHER): Payer: Medicare Other | Admitting: Ophthalmology

## 2017-10-22 DIAGNOSIS — H35413 Lattice degeneration of retina, bilateral: Secondary | ICD-10-CM

## 2017-10-22 DIAGNOSIS — Z961 Presence of intraocular lens: Secondary | ICD-10-CM | POA: Diagnosis not present

## 2017-10-22 DIAGNOSIS — H3581 Retinal edema: Secondary | ICD-10-CM | POA: Diagnosis not present

## 2017-10-22 DIAGNOSIS — E119 Type 2 diabetes mellitus without complications: Secondary | ICD-10-CM | POA: Diagnosis not present

## 2017-10-22 DIAGNOSIS — H353131 Nonexudative age-related macular degeneration, bilateral, early dry stage: Secondary | ICD-10-CM | POA: Diagnosis not present

## 2017-10-22 MED ORDER — PREDNISOLONE ACETATE 1 % OP SUSP: 1.0000 [drp] | Freq: Four times a day (QID) | OPHTHALMIC | 0 refills | Status: DC

## 2017-10-23 ENCOUNTER — Encounter (INDEPENDENT_AMBULATORY_CARE_PROVIDER_SITE_OTHER): Payer: Self-pay | Admitting: Ophthalmology

## 2017-10-26 ENCOUNTER — Ambulatory Visit: Payer: Self-pay | Admitting: Orthopedic Surgery

## 2017-10-26 NOTE — H&P (View-Only) (Signed)
Joshua [name] Joshua [name] DOB: [birth year] Married / Language: [place] / Race: White Male Date of admission: November 11, 2017  Chief complaint: Right knee pain History of Present Illness The patient is a 81 year old male who comes in  for a preoperative History and Physical. The patient is scheduled for a right total knee arthroplasty to be performed by Dr. Gus RankinFrank V. Aluisio, [facility] on 11-11-2017. The patient is a 81 year old male who presented with knee complaints. The patient reports left knee and right knee symptoms including: pain which began year(s) ago without any known injury. The patient describes the severity of the symptoms as moderate in severity (occasionally severe). The patient describes their pain as sharp. The patient feels that the symptoms are worsening. He is having balance issues because of the knees. He uses a cane or walker. He takes a goody powder for the knees. He did see another practice for the knees and was told then that he needed knee replacements. He has more trouble with the right knee than the left. He has had Flexogenix as well as cortisone injections in the past with minimal benefit. He reports that his activity is very limited because of the knees. He would like to have them replaced. He has had knee problems for many years now. The RIGHT knee is more problematic than the LEFT. He is an extremely active man but has been inactive recently because of his knees. He was reluctant to have them checked but given the level of inactivity and level of pain he decided it is time to get something done about them. They have been treated conservatively in the past for the above stated problem and despite conservative measures, they continue to have progressive pain and severe functional limitations and dysfunction. They have failed non-operative management including home exercise, medications, and injections. It is felt that they would benefit from undergoing total joint  replacement. Risks and benefits of the procedure have been discussed with the patient and they elect to proceed with surgery. There are no active contraindications to surgery such as ongoing infection or rapidly progressive neurological disease.   Problem List/Past Medical  Primary osteoarthritis of both knees (M17.0)  Gout  High blood pressure  Hypercholesterolemia  Migraine Headache  Osteoarthritis  Peripheral Neuropathy  Cataract  Dentures  Pneumonia  Non-Insulin Dependent Diabetes Mellitus   Allergies No Known Drug Allergies  Family History  Cancer  Brother, Father. Congestive Heart Failure  Father. Heart Disease  Father. Hypertension  Father. Osteoarthritis  Mother. Osteoporosis  Mother. Father  Deceased. Mother  Deceased.  Social History  Children  3 Current work status  retired Scientist, physiologicalxercise  Exercises rarely; does running / walking and other Living situation  live with spouse Marital status  married Never consumed alcohol  07/23/2017: Never consumed alcohol No history of drug/alcohol rehab  Not under pain contract  Number of flights of stairs before winded  1 Tobacco / smoke exposure  07/23/2017: no Tobacco use  Former smoker. 07/23/2017: smoke(Joshua) 3 more pack(s) per day Post-Surgical Plans  Home With Family, Home with HHPT. Advance Directives  Living [place], [place].  Medication History  Allopurinol Active. Lisinopril Active. Amlodipine Active. Metformin Active. Metoprolol Active. Prednisone Active. Tramadol Active. Lovastain Active. Gabapentin Active. Oxycodone Active.  Past Surgical History  Cataract Surgery  bilateral Neck Disc Surgery  Spinal Fusion  neck   Review of Systems General Not Present- Chills, Fatigue, Fever, Memory Loss, Night Sweats, Weight Gain  and Weight Loss. Skin Not Present- Eczema, Hives, Itching, Lesions and Rash. HEENT Present- Headache and Tinnitus. Not Present-  Dentures, Double Vision, Hearing Loss and Visual Loss. Respiratory Not Present- Allergies, Chronic Cough, Coughing up blood, Shortness of breath at rest and Shortness of breath with exertion. Cardiovascular Not Present- Chest Pain, Difficulty Breathing Lying Down, Murmur, Palpitations, Racing/skipping heartbeats and Swelling. Gastrointestinal Not Present- Abdominal Pain, Bloody Stool, Constipation, Diarrhea, Difficulty Swallowing, Heartburn, Jaundice, Loss of appetitie, Nausea and Vomiting. Male Genitourinary Not Present- Blood in Urine, Discharge, Flank Pain, Incontinence, Painful Urination, Urgency, Urinary frequency, Urinary Retention, Urinating at Night and Weak urinary stream. Musculoskeletal Present- Joint Pain, Morning Stiffness and Muscle Weakness. Not Present- Back Pain, Joint Swelling, Muscle Pain and Spasms. Neurological Present- Difficulty with balance. Not Present- Blackout spells, Dizziness, Paralysis, Tremor and Weakness. Psychiatric Not Present- Insomnia.  Vitals Weight: 198 lb Height: 73in Weight was reported by patient. Height was reported by patient. Body Surface Area: 2.14 m Body Mass Index: 26.12 kg/m  Pulse: 64 (Regular)  BP: 132/70 (Sitting, Right Arm, Standard)    Physical Exam  General Mental Status -Alert, cooperative and good historian. General Appearance-pleasant, Not in acute distress. Orientation-Oriented X3. Build & Nutrition-Well nourished and Well developed.  Head and Neck Head-normocephalic, atraumatic . Neck Global Assessment - supple, no bruit auscultated on the right, no bruit auscultated on the left.  Eye Pupil - Bilateral-Regular and Round. Motion - Bilateral-EOMI.  ENMT Note: upper and lower denture plates   Chest and Lung Exam Auscultation Breath sounds - clear at anterior chest wall and clear at posterior chest wall. Adventitious sounds - No Adventitious sounds.  Cardiovascular Auscultation Rhythm -  Regular rate and rhythm. Heart Sounds - S1 WNL and S2 WNL. Murmurs & Other Heart Sounds - Auscultation of the heart reveals - No Murmurs.  Abdomen Palpation/Percussion Tenderness - Abdomen is non-tender to palpation. Rigidity (guarding) - Abdomen is soft. Auscultation Auscultation of the abdomen reveals - Bowel sounds normal.  Male Genitourinary Note: Not done, not pertinent to present illness   Musculoskeletal Note: Examination of the right hip shows flexion to 120 rotation in 30 abduction 40 and external rotation of 40. There is no tenderness over the greater trochanter. There is no pain on provocative testing of the hip.Evaluation of the left hip shows flexion to 120 rotation in 30 out 40 and abduction 40 without discomfort. There is no tenderness over the greater trochanter. There is no pain on provocative testing of the hip. His RIGHT knee shows varus deformity. His range of motion is 10-125. There is marked crepitus on range of motion with tenderness medial greater than lateral and no instability. His LEFT knee shows no effusion. He has slight varus. Range about 5-125. He is tender medial greater than lateral with no instability. His gait pattern is significantly antalgic.  Radiographs-AP and lateral both knees show severe erosive tricompartmental arthritis of the RIGHT knee worse medial and patellofemoral with erosion of the medial proximal tibia. LEFT knee shows bone-on-bone medial patella and patellofemoral advanced in nature but not as bad as the RIGHT.   Assessment & Plan Primary osteoarthritis of both knees (M17.0)  Note:Surgical Plans: Right Total Knee Replacement  Disposition: Home with family, HHPT  PCP: Dr. Jeannetta NapElkins Cards: Dr. Donnie Ahoilley  IV TXA  Anesthesia Issues: None  Patient was instructed on what medications to stop prior to surgery.  Signed electronically by Lauraine RinneAlexzandrew L Torsha Lemus, III PA-C

## 2017-10-26 NOTE — H&P (Signed)
Joshua [name] DOB: 09/20/1936 Married / Language: English / Race: White Male Date of admission: November 11, 2017  Chief complaint: Right knee pain History of Present Illness The patient is a 81 year old male who comes in  for a preoperative History and Physical. The patient is scheduled for a right total knee arthroplasty to be performed by Dr. Frank V. Aluisio, MD at [facility] on 11-11-2017. The patient is a 81 year old male who presented with knee complaints. The patient reports left knee and right knee symptoms including: pain which began year(s) ago without any known injury. The patient describes the severity of the symptoms as moderate in severity (occasionally severe). The patient describes their pain as sharp. The patient feels that the symptoms are worsening. He is having balance issues because of the knees. He uses a cane or walker. He takes a goody powder for the knees. He did see another practice for the knees and was told then that he needed knee replacements. He has more trouble with the right knee than the left. He has had Flexogenix as well as cortisone injections in the past with minimal benefit. He reports that his activity is very limited because of the knees. He would like to have them replaced. He has had knee problems for many years now. The RIGHT knee is more problematic than the LEFT. He is an extremely active man but has been inactive recently because of his knees. He was reluctant to have them checked but given the level of inactivity and level of pain he decided it is time to get something done about them. They have been treated conservatively in the past for the above stated problem and despite conservative measures, they continue to have progressive pain and severe functional limitations and dysfunction. They have failed non-operative management including home exercise, medications, and injections. It is felt that they would benefit from undergoing total joint  replacement. Risks and benefits of the procedure have been discussed with the patient and they elect to proceed with surgery. There are no active contraindications to surgery such as ongoing infection or rapidly progressive neurological disease.   Problem List/Past Medical  Primary osteoarthritis of both knees (M17.0)  Gout  High blood pressure  Hypercholesterolemia  Migraine Headache  Osteoarthritis  Peripheral Neuropathy  Cataract  Dentures  Pneumonia  Non-Insulin Dependent Diabetes Mellitus   Allergies No Known Drug Allergies  Family History  Cancer  Brother, Father. Congestive Heart Failure  Father. Heart Disease  Father. Hypertension  Father. Osteoarthritis  Mother. Osteoporosis  Mother. Father  Deceased. Mother  Deceased.  Social History  Children  3 Current work status  retired Exercise  Exercises rarely; does running / walking and other Living situation  live with spouse Marital status  married Never consumed alcohol  07/23/2017: Never consumed alcohol No history of drug/alcohol rehab  Not under pain contract  Number of flights of stairs before winded  1 Tobacco / smoke exposure  07/23/2017: no Tobacco use  Former smoker. 07/23/2017: smoke(d) 3 more pack(s) per day Post-Surgical Plans  Home With Family, Home with HHPT. Advance Directives  Living Will, Healthcare Power of Attorney.  Medication History  Allopurinol Active. Lisinopril Active. Amlodipine Active. Metformin Active. Metoprolol Active. Prednisone Active. Tramadol Active. Lovastain Active. Gabapentin Active. Oxycodone Active.  Past Surgical History  Cataract Surgery  bilateral Neck Disc Surgery  Spinal Fusion  neck   Review of Systems General Not Present- Chills, Fatigue, Fever, Memory Loss, Night Sweats, Weight Gain   and Weight Loss. Skin Not Present- Eczema, Hives, Itching, Lesions and Rash. HEENT Present- Headache and Tinnitus. Not Present-  Dentures, Double Vision, Hearing Loss and Visual Loss. Respiratory Not Present- Allergies, Chronic Cough, Coughing up blood, Shortness of breath at rest and Shortness of breath with exertion. Cardiovascular Not Present- Chest Pain, Difficulty Breathing Lying Down, Murmur, Palpitations, Racing/skipping heartbeats and Swelling. Gastrointestinal Not Present- Abdominal Pain, Bloody Stool, Constipation, Diarrhea, Difficulty Swallowing, Heartburn, Jaundice, Loss of appetitie, Nausea and Vomiting. Male Genitourinary Not Present- Blood in Urine, Discharge, Flank Pain, Incontinence, Painful Urination, Urgency, Urinary frequency, Urinary Retention, Urinating at Night and Weak urinary stream. Musculoskeletal Present- Joint Pain, Morning Stiffness and Muscle Weakness. Not Present- Back Pain, Joint Swelling, Muscle Pain and Spasms. Neurological Present- Difficulty with balance. Not Present- Blackout spells, Dizziness, Paralysis, Tremor and Weakness. Psychiatric Not Present- Insomnia.  Vitals Weight: 198 lb Height: 73in Weight was reported by patient. Height was reported by patient. Body Surface Area: 2.14 m Body Mass Index: 26.12 kg/m  Pulse: 64 (Regular)  BP: 132/70 (Sitting, Right Arm, Standard)    Physical Exam  General Mental Status -Alert, cooperative and good historian. General Appearance-pleasant, Not in acute distress. Orientation-Oriented X3. Build & Nutrition-Well nourished and Well developed.  Head and Neck Head-normocephalic, atraumatic . Neck Global Assessment - supple, no bruit auscultated on the right, no bruit auscultated on the left.  Eye Pupil - Bilateral-Regular and Round. Motion - Bilateral-EOMI.  ENMT Note: upper and lower denture plates   Chest and Lung Exam Auscultation Breath sounds - clear at anterior chest wall and clear at posterior chest wall. Adventitious sounds - No Adventitious sounds.  Cardiovascular Auscultation Rhythm -  Regular rate and rhythm. Heart Sounds - S1 WNL and S2 WNL. Murmurs & Other Heart Sounds - Auscultation of the heart reveals - No Murmurs.  Abdomen Palpation/Percussion Tenderness - Abdomen is non-tender to palpation. Rigidity (guarding) - Abdomen is soft. Auscultation Auscultation of the abdomen reveals - Bowel sounds normal.  Male Genitourinary Note: Not done, not pertinent to present illness   Musculoskeletal Note: Examination of the right hip shows flexion to 120 rotation in 30 abduction 40 and external rotation of 40. There is no tenderness over the greater trochanter. There is no pain on provocative testing of the hip.Evaluation of the left hip shows flexion to 120 rotation in 30 out 40 and abduction 40 without discomfort. There is no tenderness over the greater trochanter. There is no pain on provocative testing of the hip. His RIGHT knee shows varus deformity. His range of motion is 10-125. There is marked crepitus on range of motion with tenderness medial greater than lateral and no instability. His LEFT knee shows no effusion. He has slight varus. Range about 5-125. He is tender medial greater than lateral with no instability. His gait pattern is significantly antalgic.  Radiographs-AP and lateral both knees show severe erosive tricompartmental arthritis of the RIGHT knee worse medial and patellofemoral with erosion of the medial proximal tibia. LEFT knee shows bone-on-bone medial patella and patellofemoral advanced in nature but not as bad as the RIGHT.   Assessment & Plan Primary osteoarthritis of both knees (M17.0)  Note:Surgical Plans: Right Total Knee Replacement  Disposition: Home with family, HHPT  PCP: Dr. Jeannetta NapElkins Cards: Dr. Donnie Ahoilley  IV TXA  Anesthesia Issues: None  Patient was instructed on what medications to stop prior to surgery.  Signed electronically by Lauraine RinneAlexzandrew L Perkins, III PA-C

## 2017-11-04 NOTE — Progress Notes (Signed)
[facility] - Clinic Note  11/05/2017     CHIEF COMPLAINT Patient presents for Post-op Follow-up   HISTORY OF PRESENT ILLNESS: Joshua [name] is a 81 y.o. male who presents to the clinic today for:   HPI    Post-op Follow-up    In left eye.  Discomfort includes none.  Negative for pain, itching, foreign body sensation, tearing, discharge and floaters.  Vision is stable.  I, the attending physician,  performed the HPI with the patient and updated documentation appropriately.          Comments    2 week s/p laser retinopexy OS (10/22/2017), lattice degeneration OU. Patient states no new floaters or flashes. Vision seems about the same OU. Pt diabetic. Doesn't check BS.       Last edited by Rennis Chris, MD on 11/05/2017  1:57 PM. (History)    Pt will be having knee surgery on the 3rd of December and will not be able to follow up until recovered;   Referring physician: Kaleen Mask, MD [address] [place], [place] [address]  HISTORICAL INFORMATION:   Selected notes from the MEDICAL RECORD NUMBER Referral from Dr. Eugene Garnet for concern of ARMD OU;  LEE- 11.6.18 (Dr. Eugene Garnet) Ocular Hx- pseudophakia OU (Surgeon: Eps), DES OU PMH- elevated chol., DM, HTN   CURRENT MEDICATIONS: Current Outpatient Medications (Ophthalmic Drugs)  Medication Sig  . Polyethyl Glycol-Propyl Glycol (SYSTANE OP) Apply to eye.  . prednisoLONE acetate (PRED FORTE) 1 % ophthalmic suspension Place 1 drop 4 (four) times daily into the left eye. (Patient not taking: Reported on 11/05/2017)   No current facility-administered medications for this visit.  (Ophthalmic Drugs)   Current Outpatient Medications (Other)  Medication Sig  . allopurinol (ZYLOPRIM) 300 MG tablet Take 300 mg by mouth 2 (two) times daily.  Marland Kitchen amLODipine (NORVASC) 10 MG tablet Take 10 mg by mouth daily.  . fluticasone (FLONASE) 50 MCG/ACT nasal spray Place 2 sprays into both nostrils at  bedtime.  . gabapentin (NEURONTIN) 300 MG capsule Take 300-600 mg by mouth at bedtime as needed (Pain).   Marland Kitchen lisinopril (PRINIVIL,ZESTRIL) 40 MG tablet Take 40 mg by mouth daily.  Marland Kitchen lovastatin (MEVACOR) 40 MG tablet Take 40 mg by mouth at bedtime.   . metFORMIN (GLUCOPHAGE) 500 MG tablet Take 1,000 mg by mouth daily with breakfast.   . metoprolol (LOPRESSOR) 100 MG tablet Take 100 mg by mouth daily.  Marland Kitchen oxyCODONE-acetaminophen (PERCOCET/ROXICET) 5-325 MG tablet Take 1 tablet by mouth every 6 (six) hours as needed for severe pain.  Marland Kitchen oxyCODONE-acetaminophen (PERCOCET/ROXICET) 5-325 MG tablet Take 1 tablet by mouth every 6 (six) hours as needed for severe pain.   No current facility-administered medications for this visit.  (Other)      REVIEW OF SYSTEMS: ROS    Positive for: Musculoskeletal, Endocrine, Eyes, Allergic/Imm   Negative for: Constitutional, Gastrointestinal, Neurological, Skin, Genitourinary, HENT, Cardiovascular, Respiratory, Psychiatric, Heme/Lymph   Last edited by Annalee Genta D on 11/05/2017  1:10 PM. (History)       ALLERGIES No Known Allergies  PAST MEDICAL HISTORY Past Medical History:  Diagnosis Date  . Anemia    hx.  . Arthritis   . Diabetes mellitus without complication (HCC)    pt.states that he does not ever check blood sugars at home  . Headache   . High cholesterol   . History of kidney stones   . Hypertension   . Pneumonia    years  ago   Past Surgical History:  Procedure Laterality Date  . ANKLE FRACTURE SURGERY Left    when [age]- years old  . ANTERIOR CERVICAL DECOMP/DISCECTOMY FUSION N/A 07/11/2017   Procedure: Anterior Cervical Decompression/Discectomy Fusion  - Cervical three-four;  Surgeon: Donalee Citrinram, Gary, MD;  [facility];  Service: Neurosurgery;  Laterality: N/A;  . CATARACT EXTRACTION Bilateral   . FRACTURE SURGERY      fx. lower left leg at age 79-13    FAMILY HISTORY Family History  Problem Relation Age of Onset  . Strabismus  Daughter     SOCIAL HISTORY Social History   Tobacco Use  . Smoking status: Former Smoker    Packs/day: 2.00    Years: 60.00    Pack years: 120.00    Types: Cigarettes    Last attempt to quit: 07/10/2016    Years since quitting: 1.3  . Smokeless tobacco: Never Used  Substance Use Topics  . Alcohol use: No  . Drug use: No         OPHTHALMIC EXAM:  Base Eye Exam    Visual Acuity (Snellen - Linear)      Right Left   Dist [place] 20/25 20/30   Dist ph [place] 20/25 20/30 +2       Tonometry (Tonopen, 1:16 PM)      Right Left   Pressure 17 15       Pupils      Dark Light Shape React APD   Right 3 2 Round Brisk None   Left 3 2 Round Brisk None       Visual Fields (Counting fingers)      Left Right    Full Full       Extraocular Movement      Right Left    Full, Ortho Full, Ortho       Neuro/Psych    Oriented x3:  Yes   Mood/Affect:  Normal       Dilation    Both eyes:  1.0% Mydriacyl, 2.5% Phenylephrine @ 1:17 PM        Slit Lamp and Fundus Exam    Slit Lamp Exam      Right Left   Lids/Lashes Dermatochalasis - upper lid Dermatochalasis - upper lid   Conjunctiva/Sclera White and quiet White and quiet   Cornea subepithelial scarring para central, Arcus Round subepithelial scarring nasal para central, Arcus   Anterior Chamber Deep and quiet Deep and quiet   Iris Round and dialted Round and dialted   Lens Three piece Posterior chamber intraocular lens in good position Three piece Posterior chamber intraocular lens in good position with open PC   Vitreous Vitreous syneresis, Posterior vitreous detachment Vitreous syneresis, Posterior vitreous detachment       Fundus Exam      Right Left   Disc Normal Normal   C/D Ratio 0.5 0.5   Macula Flat, Retinal pigment epithelial mottling, mild Drusen, no heme or edema Flat, Drusen, no heme or edema   Vessels Normal Normal   Periphery attached, Superior pigmented lattice degeneration,  attached, Superior pigmented lattice  degeneration with small atrophic holes - good laser surrounding          IMAGING AND PROCEDURES  Imaging and Procedures for 11/05/17  Repair Retinal Breaks, Laser - OD - Right Eye     LASER PROCEDURE NOTE  Procedure:  Barrier laser retinopexy using laser indirect ophthalmoscope, right eye   Diagnosis:   Lattice degeneration, right eye  Located at 12 oclock ant to equator  Surgeon: Rennis ChrisBrian Koji Niehoff, MD, PhD  Anesthesia: Topical  Informed consent obtained, operative eye marked, and time out performed prior to initiation of laser.   Laser settings:  Lumenis [id number] laser indirect ophthalmoscope Power: 300 mW Duration: 150 msec  # spots: 173  Placement of laser: Laser was placed in three confluent rows around superior lattice at 12 oclock with additional rows anteriorly.  Complications: None.  Patient tolerated the procedure well and received written and verbal post-procedure care information/education.                 ASSESSMENT/PLAN:    ICD-10-CM   1. Lattice degeneration of both retinas H35.413 Repair Retinal Breaks, Laser - OD - Right Eye  2. 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 Diabetes mellitus type 2 without retinopathy (HCC) E11.9   4. Pseudophakia Z96.1     1. Lattice degeneration OU-   - pigmented lattice superiorly with atrophic holes  - no SRF  - discussed findings, prognosis, and possible treatment options including observation vs laser retinopexy  - s/p laser retinopexy OS (11.13.18)  - laser looks good  - recommend laser retinopexy OD today (11.27.18)  - RBA of procedure discussed, questions answered  - informed consent obtained and signed  - see procedure note  - start PF QID OD x7 days  - f/u in 2 months for recheck (pt unable to follow up in 2 weeks due to having knee surgery on 12.3.18)  2. Age related macular degeneration, non-exudative, both eyes -- early  - The  incidence, anatomy, and pathology of dry AMD, risk of progression, and the AREDS and AREDS 2 study including smoking risks discussed with patient.  - Recommend amsler grid monitoring  - will continue to monitor  3. DM2 w/o retinopathy The incidence, risk factors for progression, natural history and treatment options for diabetic retinopathy  were discussed with patient.  The need for close monitoring of blood glucose, blood pressure, and serum lipids, avoiding cigarette or any type of tobacco, and the need for long term follow up was also discussed with patient.  4. Pseudophakia OU  - s/p CE/IOL OU  - beautiful surgery, doing well  - monitor   Ophthalmic Meds Ordered this visit:  No orders of the defined types were placed in this encounter.      Return in about 2 months (around 01/05/2018) for F/U lattice degen OU.  There are no Patient Instructions on file for this visit.   Explained the diagnoses, plan, and follow up with the patient and they expressed understanding.  Patient expressed understanding of the importance of proper follow up care.   Karie ChimeraBrian G. Saim Almanza, M.D., Ph.D. Diseases & Surgery of the Retina and Vitreous [facility] 11/05/17     Abbreviations: M myopia (nearsighted); A astigmatism; H hyperopia (farsighted); P presbyopia; Mrx spectacle prescription;  CTL contact lenses; OD right eye; OS left eye; OU both eyes  XT exotropia; ET esotropia; PEK punctate epithelial keratitis; PEE punctate epithelial erosions; DES dry eye syndrome; MGD meibomian gland dysfunction; ATs artificial tears; PFAT's preservative free artificial tears; NSC nuclear sclerotic cataract; PSC posterior subcapsular cataract; ERM epi-retinal membrane; PVD posterior vitreous detachment; RD retinal detachment; DM diabetes mellitus; DR diabetic retinopathy; NPDR non-proliferative diabetic retinopathy; PDR proliferative diabetic retinopathy; CSME clinically significant macular edema;  DME diabetic macular edema; dbh dot blot hemorrhages; CWS cotton wool spot; POAG primary open  angle glaucoma; C/D cup-to-disc ratio; HVF humphrey visual field; GVF goldmann visual field; OCT optical coherence tomography; IOP intraocular pressure; BRVO Branch retinal vein occlusion; CRVO central retinal vein occlusion; CRAO central retinal artery occlusion; BRAO branch retinal artery occlusion; RT retinal tear; SB scleral buckle; PPV pars plana vitrectomy; VH Vitreous hemorrhage; PRP panretinal laser photocoagulation; IVK intravitreal kenalog; VMT vitreomacular traction; MH Macular hole;  NVD neovascularization of the disc; NVE neovascularization elsewhere; AREDS age related eye disease study; ARMD age related macular degeneration; POAG primary open angle glaucoma; EBMD epithelial/anterior basement membrane dystrophy; ACIOL anterior chamber intraocular lens; IOL intraocular lens; PCIOL posterior chamber intraocular lens; Phaco/IOL phacoemulsification with intraocular lens placement; [place] photorefractive keratectomy; LASIK laser assisted in situ keratomileusis; HTN hypertension; DM diabetes mellitus; COPD chronic obstructive pulmonary disease

## 2017-11-05 ENCOUNTER — Other Ambulatory Visit (HOSPITAL_COMMUNITY): Payer: Self-pay | Admitting: Emergency Medicine

## 2017-11-05 ENCOUNTER — Encounter (HOSPITAL_COMMUNITY): Payer: Self-pay

## 2017-11-05 ENCOUNTER — Ambulatory Visit (INDEPENDENT_AMBULATORY_CARE_PROVIDER_SITE_OTHER): Payer: Medicare Other | Admitting: Ophthalmology

## 2017-11-05 ENCOUNTER — Encounter (INDEPENDENT_AMBULATORY_CARE_PROVIDER_SITE_OTHER): Payer: Self-pay | Admitting: Ophthalmology

## 2017-11-05 DIAGNOSIS — H35413 Lattice degeneration of retina, bilateral: Secondary | ICD-10-CM | POA: Diagnosis not present

## 2017-11-05 DIAGNOSIS — H353131 Nonexudative age-related macular degeneration, bilateral, early dry stage: Secondary | ICD-10-CM

## 2017-11-05 DIAGNOSIS — Z961 Presence of intraocular lens: Secondary | ICD-10-CM

## 2017-11-05 DIAGNOSIS — E119 Type 2 diabetes mellitus without complications: Secondary | ICD-10-CM

## 2017-11-05 NOTE — Patient Instructions (Signed)
Ruthe MannanLarry D Fussner  11/05/2017   Your procedure is scheduled on: 11-11-17  [facility]    Report to admitting [id number]   Call this number if you have problems the morning of surgery [phone]   Remember: ONLY 1 PERSON MAY GO WITH YOU TO SHORT STAY TO GET  READY MORNING OF YOUR SURGERY.  Do not eat food or drink liquids :After Midnight.     Take these medicines the morning of surgery with A SIP OF WATER: amlodipine, allopurinol, metoprolol, percocet if needed, nasal spray and eye drops if needed                                You may not have any metal on your body including hair pins and              piercings  Do not wear jewelry, make-up, lotions, powders or perfumes, deodorant                      Men may shave face and neck.   Do not bring valuables to the hospital. [facility] IS NOT             RESPONSIBLE   FOR VALUABLES.  Contacts, dentures or bridgework may not be worn into surgery.  Leave suitcase in the car. After surgery it may be brought to your room.                 Please read over the following fact sheets you were given: _____________________________________________________________________             How to Manage Your Diabetes Before and After Surgery  Why is it important to control my blood sugar before and after surgery? . Improving blood sugar levels before and after surgery helps healing and can limit problems. . A way of improving blood sugar control is eating a healthy diet by: o  Eating less sugar and carbohydrates o  Increasing activity/exercise o  Talking with your doctor about reaching your blood sugar goals . High blood sugars (greater than 180 mg/dL) can raise your risk of infections and slow your recovery, so you will need to focus on controlling your diabetes during the weeks before surgery. . Make sure that the doctor who takes care of your diabetes knows about your planned surgery including  the date and location.  How do I manage my blood sugar before surgery? . Check your blood sugar at least 4 times a day, starting 2 days before surgery, to make sure that the level is not too high or low. o Check your blood sugar the morning of your surgery when you wake up and every 2 hours until you get to the Short Stay unit. . If your blood sugar is less than 70 mg/dL, you will need to treat for low blood sugar: o Do not take insulin. o Treat a low blood sugar (less than 70 mg/dL) with  cup of clear juice (cranberry or apple), 4 glucose tablets, OR glucose gel. o Recheck blood sugar in 15 minutes after treatment (to make sure it is greater than 70 mg/dL). If your blood sugar is not greater than 70 mg/dL on recheck, call [phone] for further instructions. . Report your blood sugar to the short stay nurse  when you get to Short Stay.  . If you are admitted to the hospital after surgery: o Your blood sugar will be checked by the staff and you will probably be given insulin after surgery (instead of oral diabetes medicines) to make sure you have good blood sugar levels. o The goal for blood sugar control after surgery is 80-180 mg/dL.   WHAT DO I DO ABOUT MY DIABETES MEDICATION?   . THE DAY BEFORE SURGERY, take  METFORMIN as normal     . Do not take oral diabetes medicines (pills) the morning of surgery.   Patient Signature:  Date:   Nurse Signature:  Date:   Reviewed and Endorsed by [facility] Patient Education Committee, August 2015   [facility] - Preparing for [facility] Before surgery, you can play an important role.  Because skin is not sterile, your skin needs to be as free of germs as possible.  You can reduce the number of germs on your skin by washing with CHG (chlorahexidine gluconate) soap before surgery.  CHG is an antiseptic cleaner which kills germs and bonds with the skin to continue killing germs even after washing. Please DO NOT use if you have an allergy to CHG or  antibacterial soaps.  If your skin becomes reddened/irritated stop using the CHG and inform your nurse when you arrive at Short Stay. Do not shave (including legs and underarms) for at least 48 hours prior to the first CHG shower.  You may shave your face/neck. Please follow these instructions carefully:  1.  Shower with CHG Soap the night before surgery and the  morning of Surgery.  2.  If you choose to wash your hair, wash your hair first as usual with your  normal  shampoo.  3.  After you shampoo, rinse your hair and body thoroughly to remove the  shampoo.                           4.  Use CHG as you would any other liquid soap.  You can apply chg directly  to the skin and wash                       Gently with a scrungie or clean washcloth.  5.  Apply the CHG Soap to your body ONLY FROM THE NECK DOWN.   Do not use on face/ open                           Wound or open sores. Avoid contact with eyes, ears mouth and genitals (private parts).                       Wash face,  Genitals (private parts) with your normal soap.             6.  Wash thoroughly, paying special attention to the area where your surgery  will be performed.  7.  Thoroughly rinse your body with warm water from the neck down.  8.  DO NOT shower/wash with your normal soap after using and rinsing off  the CHG Soap.                9.  Pat yourself dry with a clean towel.            10.  Wear clean pajamas.  11.  Place clean sheets on your bed the night of your first shower and do not  sleep with pets. Day of Surgery : Do not apply any lotions/deodorants the morning of surgery.  Please wear clean clothes to the hospital/surgery center.  FAILURE TO FOLLOW THESE INSTRUCTIONS MAY RESULT IN THE CANCELLATION OF YOUR SURGERY PATIENT SIGNATURE_________________________________  NURSE SIGNATURE__________________________________  ________________________________________________________________________   Adam Phenix  An incentive spirometer is a tool that can help keep your lungs clear and active. This tool measures how well you are filling your lungs with each breath. Taking long deep breaths may help reverse or decrease the chance of developing breathing (pulmonary) problems (especially infection) following:  A long period of time when you are unable to move or be active. BEFORE THE PROCEDURE   If the spirometer includes an indicator to show your best effort, your nurse or respiratory therapist will set it to a desired goal.  If possible, sit up straight or lean slightly forward. Try not to slouch.  Hold the incentive spirometer in an upright position. INSTRUCTIONS FOR USE  1. Sit on the edge of your bed if possible, or sit up as far as you can in bed or on a chair. 2. Hold the incentive spirometer in an upright position. 3. Breathe out normally. 4. Place the mouthpiece in your mouth and seal your lips tightly around it. 5. Breathe in slowly and as deeply as possible, raising the piston or the ball toward the top of the column. 6. Hold your breath for 3-5 seconds or for as long as possible. Allow the piston or ball to fall to the bottom of the column. 7. Remove the mouthpiece from your mouth and breathe out normally. 8. Rest for a few seconds and repeat Steps 1 through 7 at least 10 times every 1-2 hours when you are awake. Take your time and take a few normal breaths between deep breaths. 9. The spirometer may include an indicator to show your best effort. Use the indicator as a goal to work toward during each repetition. 10. After each set of 10 deep breaths, practice coughing to be sure your lungs are clear. If you have an incision (the cut made at the time of surgery), support your incision when coughing by placing a pillow or rolled up towels firmly against it. Once you are able to get out of bed, walk around indoors and cough well. You may stop using the incentive spirometer when  instructed by your caregiver.  RISKS AND COMPLICATIONS  Take your time so you do not get dizzy or light-headed.  If you are in pain, you may need to take or ask for pain medication before doing incentive spirometry. It is harder to take a deep breath if you are having pain. AFTER USE  Rest and breathe slowly and easily.  It can be helpful to keep track of a log of your progress. Your caregiver can provide you with a simple table to help with this. If you are using the spirometer at home, follow these instructions: [place] IF:   You are having difficultly using the spirometer.  You have trouble using the spirometer as often as instructed.  Your pain medication is not giving enough relief while using the spirometer.  You develop fever of 100.5 F (38.1 C) or higher. SEEK IMMEDIATE MEDICAL CARE IF:   You cough up bloody sputum that had not been present before.  You develop fever of 102 F (38.9 C)  or greater.  You develop worsening pain at or near the incision site. MAKE SURE YOU:   Understand these instructions.  Will watch your condition.  Will get help right away if you are not doing well or get worse. Document Released: 04/08/2007 Document Revised: 02/18/2012 Document Reviewed: 06/09/2007 Exit[employer] Patient Information 2014 Exit[employer], [place].   ________________________________________________________________________  WHAT IS A BLOOD TRANSFUSION? Blood Transfusion Information  A transfusion is the replacement of blood or some of its parts. Blood is made up of multiple cells which provide different functions.  Red blood cells carry oxygen and are used for blood loss replacement.  White blood cells fight against infection.  Platelets control bleeding.  Plasma helps clot blood.  Other blood products are available for specialized needs, such as hemophilia or other clotting disorders. BEFORE THE TRANSFUSION  Who gives blood for transfusions?   Healthy  volunteers who are fully evaluated to make sure their blood is safe. This is blood bank blood. Transfusion therapy is the safest it has ever been in the practice of medicine. Before blood is taken from a donor, a complete history is taken to make sure that person has no history of diseases nor engages in risky social behavior (examples are intravenous drug use or sexual activity with multiple partners). The donor's travel history is screened to minimize risk of transmitting infections, such as malaria. The donated blood is tested for signs of infectious diseases, such as HIV and hepatitis. The blood is then tested to be sure it is compatible with you in order to minimize the chance of a transfusion reaction. If you or a relative donates blood, this is often done in anticipation of surgery and is not appropriate for emergency situations. It takes many days to process the donated blood. RISKS AND COMPLICATIONS Although transfusion therapy is very safe and saves many lives, the main dangers of transfusion include:   Getting an infectious disease.  Developing a transfusion reaction. This is an allergic reaction to something in the blood you were given. Every precaution is taken to prevent this. The decision to have a blood transfusion has been considered carefully by your caregiver before blood is given. Blood is not given unless the benefits outweigh the risks. AFTER THE TRANSFUSION  Right after receiving a blood transfusion, you will usually feel much better and more energetic. This is especially true if your red blood cells have gotten low (anemic). The transfusion raises the level of the red blood cells which carry oxygen, and this usually causes an energy increase.  The nurse administering the transfusion will monitor you carefully for complications. HOME CARE INSTRUCTIONS  No special instructions are needed after a transfusion. You may find your energy is better. Speak with your caregiver about any  limitations on activity for underlying diseases you may have. SEEK MEDICAL CARE IF:   Your condition is not improving after your transfusion.  You develop redness or irritation at the intravenous (IV) site. SEEK IMMEDIATE MEDICAL CARE IF:  Any of the following symptoms occur over the next 12 hours:  Shaking chills.  You have a temperature by mouth above 102 F (38.9 C), not controlled by medicine.  Chest, back, or muscle pain.  People around you feel you are not acting correctly or are confused.  Shortness of breath or difficulty breathing.  Dizziness and fainting.  You get a rash or develop hives.  You have a decrease in urine output.  Your urine turns a dark color or changes to pink, red,  or brown. Any of the following symptoms occur over the next 10 days:  You have a temperature by mouth above 102 F (38.9 C), not controlled by medicine.  Shortness of breath.  Weakness after normal activity.  The white part of the eye turns yellow (jaundice).  You have a decrease in the amount of urine or are urinating less often.  Your urine turns a dark color or changes to pink, red, or brown. Document Released: 11/23/2000 Document Revised: 02/18/2012 Document Reviewed: 07/12/2008 [facility] Patient Information 2014 [place], [place].  _______________________________________________________________________

## 2017-11-05 NOTE — Progress Notes (Signed)
EKG 07-02-17 epic   CXR 10-11-17 epic  CMP.uric acid. Vit b12., hemoglobin, HGBA1C 10-11-17 on chart from Dr Shelah LewandowskyElkin office

## 2017-11-07 ENCOUNTER — Encounter (HOSPITAL_COMMUNITY): Payer: Self-pay

## 2017-11-07 ENCOUNTER — Other Ambulatory Visit: Payer: Self-pay

## 2017-11-07 ENCOUNTER — Encounter (HOSPITAL_COMMUNITY)
Admission: RE | Admit: 2017-11-07 | Discharge: 2017-11-07 | Disposition: A | Discharge status: Home / Self Care | Payer: Medicare Other | Source: Ambulatory Visit | Attending: Orthopedic Surgery | Admitting: Orthopedic Surgery

## 2017-11-07 DIAGNOSIS — M1711 Unilateral primary osteoarthritis, right knee: Secondary | ICD-10-CM | POA: Insufficient documentation

## 2017-11-07 DIAGNOSIS — Z01818 Encounter for other preprocedural examination: Secondary | ICD-10-CM | POA: Diagnosis present

## 2017-11-07 LAB — PROTIME-INR
INR: 0.93
Prothrombin Time: 12.4 seconds (ref 11.4–15.2)

## 2017-11-07 LAB — CBC
HEMATOCRIT: 43 % (ref 39.0–52.0)
Hemoglobin: 14.4 g/dL (ref 13.0–17.0)
MCH: 31.9 pg (ref 26.0–34.0)
MCHC: 33.5 g/dL (ref 30.0–36.0)
MCV: 95.3 fL (ref 78.0–100.0)
Platelets: 214 10*3/uL (ref 150–400)
RBC: 4.51 MIL/uL (ref 4.22–5.81)
RDW: 14.6 % (ref 11.5–15.5)
WBC: 9.8 10*3/uL (ref 4.0–10.5)

## 2017-11-07 LAB — GLUCOSE, CAPILLARY: Glucose-Capillary: 167 mg/dL — ABNORMAL HIGH (ref 65–99)

## 2017-11-07 LAB — COMPREHENSIVE METABOLIC PANEL
ALBUMIN: 3.9 g/dL (ref 3.5–5.0)
ALT: 18 U/L (ref 17–63)
AST: 16 U/L (ref 15–41)
Alkaline Phosphatase: 89 U/L (ref 38–126)
Anion gap: 7 (ref 5–15)
BILIRUBIN TOTAL: 0.6 mg/dL (ref 0.3–1.2)
BUN: 16 mg/dL (ref 6–20)
CHLORIDE: 105 mmol/L (ref 101–111)
CO2: 26 mmol/L (ref 22–32)
Calcium: 9.2 mg/dL (ref 8.9–10.3)
Creatinine, Ser: 1.17 mg/dL (ref 0.61–1.24)
GFR calc Af Amer: >60 mL/min (ref >60)
GFR calc non Af Amer: 57 mL/min — ABNORMAL LOW (ref >60)
GLUCOSE: 143 mg/dL — AB (ref 65–99)
POTASSIUM: 4 mmol/L (ref 3.5–5.1)
SODIUM: 138 mmol/L (ref 135–145)
TOTAL PROTEIN: 7.4 g/dL (ref 6.5–8.1)

## 2017-11-07 LAB — SURGICAL PCR SCREEN
MRSA, PCR: NEGATIVE
STAPHYLOCOCCUS AUREUS: NEGATIVE

## 2017-11-07 LAB — APTT: APTT: 28 s (ref 24–36)

## 2017-11-08 LAB — TYPE AND SCREEN
ABO/RH(D): A NEG
ANTIBODY SCREEN: POSITIVE

## 2017-11-08 NOTE — Progress Notes (Addendum)
Patient and granddaughter report patient was sent by PCP to cariology Dr Donnie Ahoilley for pre-op eval. and cardiac clearance form was faxed back  to Dr Lequita HaltAluisio office. RN  [facility] requesting cardiac clearance . RN will continue to follow up   Update. No response from [employer] . RN had to call [facility] office directly to request office notes and clearance. RN will place received documents in patient chart .

## 2017-11-11 ENCOUNTER — Inpatient Hospital Stay (HOSPITAL_COMMUNITY)
Admission: AD | Admit: 2017-11-11 | Discharge: 2017-11-13 | DRG: 470 | Disposition: A | Discharge status: Home Health | Payer: Medicare Other | Source: Ambulatory Visit | Attending: Orthopedic Surgery | Admitting: Orthopedic Surgery

## 2017-11-11 ENCOUNTER — Ambulatory Visit (HOSPITAL_COMMUNITY): Payer: Medicare Other | Admitting: Anesthesiology

## 2017-11-11 ENCOUNTER — Other Ambulatory Visit: Payer: Self-pay

## 2017-11-11 ENCOUNTER — Encounter (HOSPITAL_COMMUNITY): Payer: Self-pay | Admitting: *Deleted

## 2017-11-11 ENCOUNTER — Encounter (HOSPITAL_COMMUNITY)
Admission: AD | Disposition: A | Discharge status: Home Health | Payer: Self-pay | Source: Ambulatory Visit | Attending: Orthopedic Surgery

## 2017-11-11 DIAGNOSIS — Z9842 Cataract extraction status, left eye: Secondary | ICD-10-CM

## 2017-11-11 DIAGNOSIS — Z7984 Long term (current) use of oral hypoglycemic drugs: Secondary | ICD-10-CM

## 2017-11-11 DIAGNOSIS — Z981 Arthrodesis status: Secondary | ICD-10-CM | POA: Diagnosis not present

## 2017-11-11 DIAGNOSIS — Z87891 Personal history of nicotine dependence: Secondary | ICD-10-CM | POA: Diagnosis not present

## 2017-11-11 DIAGNOSIS — Z79899 Other long term (current) drug therapy: Secondary | ICD-10-CM | POA: Diagnosis not present

## 2017-11-11 DIAGNOSIS — I10 Essential (primary) hypertension: Secondary | ICD-10-CM | POA: Diagnosis present

## 2017-11-11 DIAGNOSIS — M170 Bilateral primary osteoarthritis of knee: Principal | ICD-10-CM | POA: Diagnosis present

## 2017-11-11 DIAGNOSIS — Z9841 Cataract extraction status, right eye: Secondary | ICD-10-CM | POA: Diagnosis not present

## 2017-11-11 DIAGNOSIS — M109 Gout, unspecified: Secondary | ICD-10-CM | POA: Diagnosis present

## 2017-11-11 DIAGNOSIS — M1710 Unilateral primary osteoarthritis, unspecified knee: Secondary | ICD-10-CM | POA: Diagnosis present

## 2017-11-11 DIAGNOSIS — H9190 Unspecified hearing loss, unspecified ear: Secondary | ICD-10-CM | POA: Diagnosis present

## 2017-11-11 DIAGNOSIS — E1142 Type 2 diabetes mellitus with diabetic polyneuropathy: Secondary | ICD-10-CM | POA: Diagnosis present

## 2017-11-11 DIAGNOSIS — E7800 Pure hypercholesterolemia, unspecified: Secondary | ICD-10-CM | POA: Diagnosis present

## 2017-11-11 DIAGNOSIS — M179 Osteoarthritis of knee, unspecified: Secondary | ICD-10-CM | POA: Diagnosis present

## 2017-11-11 DIAGNOSIS — M25561 Pain in right knee: Secondary | ICD-10-CM | POA: Diagnosis present

## 2017-11-11 HISTORY — PX: TOTAL KNEE ARTHROPLASTY: SHX125

## 2017-11-11 LAB — GLUCOSE, CAPILLARY
GLUCOSE-CAPILLARY: 167 mg/dL — AB (ref 65–99)
GLUCOSE-CAPILLARY: 169 mg/dL — AB (ref 65–99)
Glucose-Capillary: 266 mg/dL — ABNORMAL HIGH (ref 65–99)
Glucose-Capillary: 326 mg/dL — ABNORMAL HIGH (ref 65–99)

## 2017-11-11 LAB — TYPE AND SCREEN
ABO/RH(D): A NEG
Antibody Screen: POSITIVE

## 2017-11-11 SURGERY — ARTHROPLASTY, KNEE, TOTAL
Anesthesia: Spinal | Site: Knee | Laterality: Right

## 2017-11-11 MED ORDER — FENTANYL CITRATE (PF) 100 MCG/2ML IJ SOLN
INTRAMUSCULAR | Status: AC
Start: 2017-11-11 — End: 2017-11-11
  Administered 2017-11-11: 50 ug via INTRAVENOUS
  Filled 2017-11-11: qty 2

## 2017-11-11 MED ORDER — HYDROMORPHONE HCL 1 MG/ML IJ SOLN: 0.2500 mg | INTRAMUSCULAR | Status: DC | PRN

## 2017-11-11 MED ORDER — CEFAZOLIN SODIUM-DEXTROSE 2-4 GM/100ML-% IV SOLN
2.0000 g | Freq: Four times a day (QID) | INTRAVENOUS | Status: AC
  Administered 2017-11-11 – 2017-11-12 (×2): 2 g via INTRAVENOUS
  Filled 2017-11-11 (×2): qty 100

## 2017-11-11 MED ORDER — BISACODYL 10 MG RE SUPP: 10.0000 mg | Freq: Every day | RECTAL | Status: DC | PRN

## 2017-11-11 MED ORDER — ACETAMINOPHEN 650 MG RE SUPP: 650.0000 mg | RECTAL | Status: DC | PRN

## 2017-11-11 MED ORDER — LACTATED RINGERS IV SOLN: INTRAVENOUS | Status: DC

## 2017-11-11 MED ORDER — DEXAMETHASONE SODIUM PHOSPHATE 10 MG/ML IJ SOLN
INTRAMUSCULAR | Status: AC
  Filled 2017-11-11: qty 1

## 2017-11-11 MED ORDER — ACETAMINOPHEN 10 MG/ML IV SOLN
INTRAVENOUS | Status: AC
  Filled 2017-11-11: qty 100

## 2017-11-11 MED ORDER — ONDANSETRON HCL 4 MG/2ML IJ SOLN
INTRAMUSCULAR | Status: AC
  Filled 2017-11-11: qty 2

## 2017-11-11 MED ORDER — MENTHOL 3 MG MT LOZG: 1.0000 | LOZENGE | OROMUCOSAL | Status: DC | PRN

## 2017-11-11 MED ORDER — MIDAZOLAM HCL 2 MG/2ML IJ SOLN
INTRAMUSCULAR | Status: AC
  Administered 2017-11-11: 1 mg via INTRAVENOUS
  Filled 2017-11-11: qty 2

## 2017-11-11 MED ORDER — PRAVASTATIN SODIUM 20 MG PO TABS
20.0000 mg | ORAL_TABLET | Freq: Every day | ORAL | Status: DC
  Administered 2017-11-11 – 2017-11-12 (×2): 20 mg via ORAL
  Filled 2017-11-11 (×2): qty 1

## 2017-11-11 MED ORDER — TRAMADOL HCL 50 MG PO TABS: 50.0000 mg | ORAL_TABLET | Freq: Three times a day (TID) | ORAL | Status: DC | PRN

## 2017-11-11 MED ORDER — BUPIVACAINE LIPOSOME 1.3 % IJ SUSP
20.0000 mL | Freq: Once | INTRAMUSCULAR | Status: DC
  Filled 2017-11-11: qty 20

## 2017-11-11 MED ORDER — SODIUM CHLORIDE 0.9 % IJ SOLN
INTRAMUSCULAR | Status: DC | PRN
  Administered 2017-11-11: 60 mL

## 2017-11-11 MED ORDER — ACETAMINOPHEN 10 MG/ML IV SOLN: 1000.0000 mg | Freq: Once | INTRAVENOUS | Status: DC

## 2017-11-11 MED ORDER — ROPIVACAINE HCL 7.5 MG/ML IJ SOLN
INTRAMUSCULAR | Status: DC | PRN
  Administered 2017-11-11: 20 mL via PERINEURAL

## 2017-11-11 MED ORDER — OXYCODONE HCL 5 MG PO TABS
5.0000 mg | ORAL_TABLET | ORAL | Status: DC | PRN
  Administered 2017-11-12 – 2017-11-13 (×5): 5 mg via ORAL
  Filled 2017-11-11 (×5): qty 1

## 2017-11-11 MED ORDER — ONDANSETRON HCL 4 MG PO TABS: 4.0000 mg | ORAL_TABLET | Freq: Four times a day (QID) | ORAL | Status: DC | PRN

## 2017-11-11 MED ORDER — DEXAMETHASONE SODIUM PHOSPHATE 10 MG/ML IJ SOLN
10.0000 mg | Freq: Once | INTRAMUSCULAR | Status: AC
  Administered 2017-11-12: 10 mg via INTRAVENOUS
  Filled 2017-11-11: qty 1

## 2017-11-11 MED ORDER — METOCLOPRAMIDE HCL 5 MG PO TABS: 5.0000 mg | ORAL_TABLET | Freq: Three times a day (TID) | ORAL | Status: DC | PRN

## 2017-11-11 MED ORDER — ONDANSETRON HCL 4 MG/2ML IJ SOLN: 4.0000 mg | Freq: Four times a day (QID) | INTRAMUSCULAR | Status: DC | PRN

## 2017-11-11 MED ORDER — MEPERIDINE HCL 50 MG/ML IJ SOLN: 6.2500 mg | INTRAMUSCULAR | Status: DC | PRN

## 2017-11-11 MED ORDER — METHOCARBAMOL 500 MG PO TABS
500.0000 mg | ORAL_TABLET | Freq: Four times a day (QID) | ORAL | Status: DC | PRN
  Administered 2017-11-12 (×3): 500 mg via ORAL
  Filled 2017-11-11 (×3): qty 1

## 2017-11-11 MED ORDER — METOCLOPRAMIDE HCL 5 MG/ML IJ SOLN: 5.0000 mg | Freq: Three times a day (TID) | INTRAMUSCULAR | Status: DC | PRN

## 2017-11-11 MED ORDER — ACETAMINOPHEN 500 MG PO TABS
1000.0000 mg | ORAL_TABLET | Freq: Four times a day (QID) | ORAL | Status: AC
  Administered 2017-11-11 – 2017-11-12 (×4): 1000 mg via ORAL
  Filled 2017-11-11 (×4): qty 2

## 2017-11-11 MED ORDER — EPHEDRINE 5 MG/ML INJ
INTRAVENOUS | Status: AC
  Filled 2017-11-11: qty 10

## 2017-11-11 MED ORDER — ACETAMINOPHEN 325 MG PO TABS
650.0000 mg | ORAL_TABLET | ORAL | Status: DC | PRN
  Administered 2017-11-12: 650 mg via ORAL
  Filled 2017-11-11: qty 2

## 2017-11-11 MED ORDER — PROPOFOL 10 MG/ML IV BOLUS
INTRAVENOUS | Status: AC
  Filled 2017-11-11: qty 40

## 2017-11-11 MED ORDER — PROPOFOL 500 MG/50ML IV EMUL
INTRAVENOUS | Status: DC | PRN
  Administered 2017-11-11: 75 ug/kg/min via INTRAVENOUS

## 2017-11-11 MED ORDER — PREDNISOLONE ACETATE 1 % OP SUSP
1.0000 [drp] | Freq: Four times a day (QID) | OPHTHALMIC | Status: DC
  Administered 2017-11-11 – 2017-11-13 (×6): 1 [drp] via OPHTHALMIC
  Filled 2017-11-11: qty 5

## 2017-11-11 MED ORDER — STERILE WATER FOR IRRIGATION IR SOLN
Status: DC | PRN
  Administered 2017-11-11: 2000 mL

## 2017-11-11 MED ORDER — MORPHINE SULFATE (PF) 4 MG/ML IV SOLN: 1.0000 mg | INTRAVENOUS | Status: DC | PRN

## 2017-11-11 MED ORDER — METHOCARBAMOL 1000 MG/10ML IJ SOLN
500.0000 mg | Freq: Four times a day (QID) | INTRAVENOUS | Status: DC | PRN
  Administered 2017-11-11: 500 mg via INTRAVENOUS
  Filled 2017-11-11: qty 550

## 2017-11-11 MED ORDER — DIPHENHYDRAMINE HCL 12.5 MG/5ML PO ELIX: 12.5000 mg | ORAL_SOLUTION | ORAL | Status: DC | PRN

## 2017-11-11 MED ORDER — MIDAZOLAM HCL 2 MG/2ML IJ SOLN: 1.0000 mg | INTRAMUSCULAR | Status: DC

## 2017-11-11 MED ORDER — EPHEDRINE SULFATE-NACL 50-0.9 MG/10ML-% IV SOSY
PREFILLED_SYRINGE | INTRAVENOUS | Status: DC | PRN
  Administered 2017-11-11: 20 mg via INTRAVENOUS
  Administered 2017-11-11 (×2): 10 mg via INTRAVENOUS
  Administered 2017-11-11: 15 mg via INTRAVENOUS
  Administered 2017-11-11: 10 mg via INTRAVENOUS

## 2017-11-11 MED ORDER — OXYCODONE HCL 5 MG PO TABS: 10.0000 mg | ORAL_TABLET | ORAL | Status: DC | PRN

## 2017-11-11 MED ORDER — PHENOL 1.4 % MT LIQD: 1.0000 | OROMUCOSAL | Status: DC | PRN

## 2017-11-11 MED ORDER — FENTANYL CITRATE (PF) 100 MCG/2ML IJ SOLN
50.0000 ug | INTRAMUSCULAR | Status: DC
  Administered 2017-11-11 (×2): 50 ug via INTRAVENOUS

## 2017-11-11 MED ORDER — SODIUM CHLORIDE 0.9 % IJ SOLN
INTRAMUSCULAR | Status: AC
  Filled 2017-11-11: qty 10

## 2017-11-11 MED ORDER — AMLODIPINE BESYLATE 10 MG PO TABS
10.0000 mg | ORAL_TABLET | Freq: Every day | ORAL | Status: DC
Start: 2017-11-12 — End: 2017-11-13
  Administered 2017-11-12 – 2017-11-13 (×2): 10 mg via ORAL
  Filled 2017-11-11 (×3): qty 1

## 2017-11-11 MED ORDER — FLUTICASONE PROPIONATE 50 MCG/ACT NA SUSP
2.0000 | Freq: Every day | NASAL | Status: DC
  Administered 2017-11-11: 2 via NASAL
  Filled 2017-11-11: qty 16

## 2017-11-11 MED ORDER — SODIUM CHLORIDE 0.9 % IJ SOLN
INTRAMUSCULAR | Status: AC
  Filled 2017-11-11: qty 50

## 2017-11-11 MED ORDER — CEFAZOLIN SODIUM-DEXTROSE 2-4 GM/100ML-% IV SOLN
2.0000 g | INTRAVENOUS | Status: AC
  Administered 2017-11-11: 2 g via INTRAVENOUS

## 2017-11-11 MED ORDER — DEXAMETHASONE SODIUM PHOSPHATE 10 MG/ML IJ SOLN: 10.0000 mg | Freq: Once | INTRAMUSCULAR | Status: DC

## 2017-11-11 MED ORDER — TRANEXAMIC ACID 1000 MG/10ML IV SOLN
1000.0000 mg | INTRAVENOUS | Status: AC
  Administered 2017-11-11: 1000 mg via INTRAVENOUS
  Filled 2017-11-11: qty 1100

## 2017-11-11 MED ORDER — BUPIVACAINE HCL (PF) 0.25 % IJ SOLN
INTRAMUSCULAR | Status: AC
  Filled 2017-11-11: qty 30

## 2017-11-11 MED ORDER — PROPOFOL 10 MG/ML IV BOLUS
INTRAVENOUS | Status: AC
  Filled 2017-11-11: qty 20

## 2017-11-11 MED ORDER — CHLORHEXIDINE GLUCONATE 4 % EX LIQD: 60.0000 mL | Freq: Once | CUTANEOUS | Status: DC

## 2017-11-11 MED ORDER — METOPROLOL TARTRATE 50 MG PO TABS
100.0000 mg | ORAL_TABLET | Freq: Every day | ORAL | Status: DC
  Administered 2017-11-12 – 2017-11-13 (×2): 100 mg via ORAL
  Filled 2017-11-11 (×2): qty 2

## 2017-11-11 MED ORDER — MIDAZOLAM HCL 2 MG/2ML IJ SOLN
2.0000 mg | Freq: Once | INTRAMUSCULAR | Status: AC
  Administered 2017-11-11 (×2): 1 mg via INTRAVENOUS

## 2017-11-11 MED ORDER — BUPIVACAINE LIPOSOME 1.3 % IJ SUSP
INTRAMUSCULAR | Status: DC | PRN
  Administered 2017-11-11: 20 mL

## 2017-11-11 MED ORDER — PROMETHAZINE HCL 25 MG/ML IJ SOLN: 6.2500 mg | INTRAMUSCULAR | Status: DC | PRN

## 2017-11-11 MED ORDER — FLEET ENEMA 7-19 GM/118ML RE ENEM: 1.0000 | ENEMA | Freq: Once | RECTAL | Status: DC | PRN

## 2017-11-11 MED ORDER — POLYETHYLENE GLYCOL 3350 17 G PO PACK: 17.0000 g | PACK | Freq: Every day | ORAL | Status: DC | PRN

## 2017-11-11 MED ORDER — SODIUM CHLORIDE 0.9 % IR SOLN
Status: DC | PRN
  Administered 2017-11-11: 1000 mL

## 2017-11-11 MED ORDER — LACTATED RINGERS IV SOLN
INTRAVENOUS | Status: DC
  Administered 2017-11-11 (×2): via INTRAVENOUS

## 2017-11-11 MED ORDER — SODIUM CHLORIDE 0.9 % IV SOLN
INTRAVENOUS | Status: DC
  Administered 2017-11-11: 18:00:00 via INTRAVENOUS

## 2017-11-11 MED ORDER — CEFAZOLIN SODIUM-DEXTROSE 2-4 GM/100ML-% IV SOLN
INTRAVENOUS | Status: AC
  Filled 2017-11-11: qty 100

## 2017-11-11 MED ORDER — ALLOPURINOL 300 MG PO TABS
300.0000 mg | ORAL_TABLET | Freq: Two times a day (BID) | ORAL | Status: DC
  Administered 2017-11-12 – 2017-11-13 (×3): 300 mg via ORAL
  Filled 2017-11-11 (×3): qty 1

## 2017-11-11 MED ORDER — RIVAROXABAN 10 MG PO TABS
10.0000 mg | ORAL_TABLET | Freq: Every day | ORAL | Status: DC
  Administered 2017-11-12 – 2017-11-13 (×2): 10 mg via ORAL
  Filled 2017-11-11 (×2): qty 1

## 2017-11-11 MED ORDER — DOCUSATE SODIUM 100 MG PO CAPS
100.0000 mg | ORAL_CAPSULE | Freq: Two times a day (BID) | ORAL | Status: DC
  Administered 2017-11-11 – 2017-11-13 (×4): 100 mg via ORAL
  Filled 2017-11-11 (×4): qty 1

## 2017-11-11 SURGICAL SUPPLY — 49 items

## 2017-11-11 NOTE — Anesthesia Postprocedure Evaluation (Signed)
Anesthesia Post Note  Patient: Joshua [name]  Procedure(s) Performed: RIGHT TOTAL KNEE ARTHROPLASTY (Right Knee)     Patient location during evaluation: PACU Anesthesia Type: Spinal and Regional Level of consciousness: oriented and awake and alert Pain management: pain level controlled Vital Signs Assessment: post-procedure vital signs reviewed and stable Respiratory status: spontaneous breathing, respiratory function stable and patient connected to nasal cannula oxygen Cardiovascular status: blood pressure returned to baseline and stable Postop Assessment: no headache, no backache, no apparent nausea or vomiting, spinal receding and patient able to bend at knees Anesthetic complications: no    Last Vitals:  Vitals:   11/11/17 1400 11/11/17 1415  BP: (!) 147/71   Pulse: (!) 57 (!) 58  Resp: 14 15  Temp:    SpO2: 97% 97%    Last Pain:  Vitals:   11/11/17 1330  TempSrc:   PainSc: Asleep                 Shelton SilvasKevin D Hollis

## 2017-11-11 NOTE — Anesthesia Preprocedure Evaluation (Addendum)
Anesthesia Evaluation  Patient identified by MRN, date of birth, ID band Patient awake    Reviewed: Allergy & Precautions, NPO status , Patient's Chart, lab work & pertinent test results  Airway Mallampati: I  TM Distance: >3 FB Neck ROM: Full    Dental  (+) Edentulous Upper, Partial Lower   Pulmonary neg pulmonary ROS, former smoker,    breath sounds clear to auscultation       Cardiovascular hypertension, Pt. on medications and Pt. on home beta blockers  Rhythm:Regular Rate:Bradycardia     Neuro/Psych  Headaches,    GI/Hepatic negative GI ROS, Neg liver ROS,   Endo/Other  diabetes, Type 2, Oral Hypoglycemic Agents  Renal/GU negative Renal ROS     Musculoskeletal  (+) Arthritis ,   Abdominal   Peds  Hematology negative hematology ROS (+)   Anesthesia Other Findings Day of surgery medications reviewed with the patient.  Reproductive/Obstetrics                           Anesthesia Physical Anesthesia Plan  ASA: II  Anesthesia Plan: Spinal   Post-op Pain Management:  Regional for Post-op pain   Induction: Intravenous  PONV Risk Score and Plan: Ondansetron, Dexamethasone, Propofol infusion and Midazolam  Airway Management Planned: Natural Airway and Nasal Cannula  Additional Equipment:   Intra-op Plan:   Post-operative Plan:   Informed Consent: I have reviewed the patients History and Physical, chart, labs and discussed the procedure including the risks, benefits and alternatives for the proposed anesthesia with the patient or authorized representative who has indicated his/her understanding and acceptance.     Plan Discussed with: CRNA  Anesthesia Plan Comments:         Anesthesia Quick Evaluation

## 2017-11-11 NOTE — Transfer of Care (Signed)
Immediate Anesthesia Transfer of Care Note  Patient: Joshua [name]  Procedure(s) Performed: RIGHT TOTAL KNEE ARTHROPLASTY (Right Knee)  Patient Location: PACU  Anesthesia Type:Regional and Spinal  Level of Consciousness: sedated  Airway & Oxygen Therapy: Patient Spontanous Breathing and Patient connected to face mask oxygen  Post-op Assessment: Report given to RN and Post -op Vital signs reviewed and stable  Post vital signs: Reviewed and stable  Last Vitals:  Vitals:   11/11/17 1030 11/11/17 1045  BP: (!) 153/68 (!) 152/63  Pulse: (!) 55 (!) 56  Resp: 17 (!) 21  Temp:    SpO2: 95% 94%    Last Pain:  Vitals:   11/11/17 0918  TempSrc: Oral      Patients Stated Pain Goal: 4 (11/11/17 0915)  Complications: No apparent anesthesia complications

## 2017-11-11 NOTE — Op Note (Signed)
OPERATIVE REPORT-TOTAL KNEE ARTHROPLASTY   Pre-operative diagnosis- Osteoarthritis  Right knee(s)  Post-operative diagnosis- Osteoarthritis Right knee(s)  Procedure-  Right  Total Knee Arthroplasty  Surgeon- Mihira Tozzi V. Blakely Maranan, MD  Assistant- Amber Constable, PA-C   Anesthesia-  Adductor canal block and spinal  EBL-20 mL   Drains Hemovac  Tourniquet time-  Total Tourniquet Time Documented: Thigh (Right) - 36 minutes Total: Thigh (Right) - 36 minutes     Complications- None  Condition-PACU - hemodynamically stable.   Brief Clinical Note  Joshua [name] is a 81 y.o. year old male with end stage OA of his right knee with progressively worsening pain and dysfunction. He has constant pain, with activity and at rest and significant functional deficits with difficulties even with ADLs. He has had extensive non-op management including analgesics, injections of cortisone and viscosupplements, and home exercise program, but remains in significant pain with significant dysfunction. Radiographs show bone on bone arthritis medial and patellofemoral. He presents now for right Total Knee Arthroplasty.    Procedure in detail---   The patient is brought into the operating room and positioned supine on the operating table. After successful administration of  Adductor canal block and spinal,   a tourniquet is placed high on the  Right thigh(s) and the lower extremity is prepped and draped in the usual sterile fashion. Time out is performed by the operating team and then the  Right lower extremity is wrapped in Esmarch, knee flexed and the tourniquet inflated to 300 mmHg.       A midline incision is made with a ten blade through the subcutaneous tissue to the level of the extensor mechanism. A fresh blade is used to make a medial parapatellar arthrotomy. Soft tissue over the proximal medial tibia is subperiosteally elevated to the joint line with a knife and into the semimembranosus bursa with a  Cobb elevator. Soft tissue over the proximal lateral tibia is elevated with attention being paid to avoiding the patellar tendon on the tibial tubercle. The patella is everted, knee flexed 90 degrees and the ACL and PCL are removed. Findings are bone on bone medial and patellofemoral, with large global osteophytes.        The drill is used to create a starting hole in the distal femur and the canal is thoroughly irrigated with sterile saline to remove the fatty contents. The 5 degree Right  valgus alignment guide is placed into the femoral canal and the distal femoral cutting block is pinned to remove 10 mm off the distal femur. Resection is made with an oscillating saw.      The tibia is subluxed forward and the menisci are removed. The extramedullary alignment guide is placed referencing proximally at the medial aspect of the tibial tubercle and distally along the second metatarsal axis and tibial crest. The block is pinned to [id number]([id number]deficient medial  side. Resection is made with an oscillating saw. Size 4is the most appropriate size for the tibia and the proximal tibia is prepared with the modular drill and keel punch for that size.      The femoral sizing guide is placed and size 5 is most appropriate. Rotation is marked off the epicondylar axis and confirmed by creating a rectangular flexion gap at 90 degrees. The size 5 cutting block is pinned in this rotation and the anterior, posterior and chamfer cuts are made with the oscillating saw. The intercondylar block is then placed and that cut is made.  Trial size 4 tibial component, trial size 5 posterior stabilized femur and a 12.5  mm posterior stabilized rotating platform insert trial is placed. Full extension is achieved with excellent varus/valgus and anterior/posterior balance throughout full range of motion. The patella is everted and thickness measured to be 24  mm. Free hand resection is taken to 14 mm, a 38 template is placed,  lug holes are drilled, trial patella is placed, and it tracks normally. Osteophytes are removed off the posterior femur with the trial in place. All trials are removed and the cut bone surfaces prepared with pulsatile lavage. Cement is mixed and once ready for implantation, the size 4 tibial implant, size  5 posterior stabilized femoral component, and the size 38 patella are cemented in place and the patella is held with the clamp. The trial insert is placed and the knee held in full extension. The Exparel (20 ml mixed with 60 ml saline) is injected into the extensor mechanism, posterior capsule, medial and lateral gutters and subcutaneous tissues.  All extruded cement is removed and once the cement is hard the permanent 12.5 mm posterior stabilized rotating platform insert is placed into the tibial tray.      The wound is copiously irrigated with saline solution and the extensor mechanism closed over a hemovac drain with #1 V-loc suture. The tourniquet is released for a total tourniquet time of 36  minutes. Flexion against gravity is 140 degrees and the patella tracks normally. Subcutaneous tissue is closed with 2.0 vicryl and subcuticular with running 4.0 Monocryl. The incision is cleaned and dried and steri-strips and a bulky sterile dressing are applied. The limb is placed into a knee immobilizer and the patient is awakened and transported to recovery in stable condition.      Please note that a surgical assistant was a medical necessity for this procedure in order to perform it in a safe and expeditious manner. Surgical assistant was necessary to retract the ligaments and vital neurovascular structures to prevent injury to them and also necessary for proper positioning of the limb to allow for anatomic placement of the prosthesis.   Joshua RankinFrank V. Joshua Fife, MD    11/11/2017, 12:25 PM

## 2017-11-11 NOTE — Interval H&P Note (Signed)
History and Physical Interval Note:  11/11/2017 9:26 AM  Joshua MannanLarry D Fluellen  has presented today for surgery, with the diagnosis of Osteoarthritis Right knee  The various methods of treatment have been discussed with the patient and family. After consideration of risks, benefits and other options for treatment, the patient has consented to  Procedure(s): RIGHT TOTAL KNEE ARTHROPLASTY (Right) as a surgical intervention .  The patient's history has been reviewed, patient examined, no change in status, stable for surgery.  I have reviewed the patient's chart and labs.  Questions were answered to the patient's satisfaction.     Homero FellersFrank Cortavius Montesinos

## 2017-11-11 NOTE — Anesthesia Procedure Notes (Signed)
Spinal  Patient location during procedure: OR Start time: 11/11/2017 11:23 AM End time: 11/11/2017 11:25 AM Staffing Resident/CRNA: Alday, Stephen R, CRNA Performed: resident/CRNA  Preanesthetic Checklist Completed: patient identified, site marked, surgical consent, pre-op evaluation, timeout performed, IV checked, risks and benefits discussed and monitors and equipment checked Spinal Block Patient position: sitting Prep: Betadine Patient monitoring: heart rate, cardiac monitor, continuous pulse ox and blood pressure Approach: midline Location: L3-4 Injection technique: single-shot Needle Needle type: Pencan  Needle gauge: 24 G Needle length: 10 cm Needle insertion depth: 7 cm Assessment Sensory level: T6 Additional Notes Timeout performed. SAB kit date checked. SAB without difficulty     

## 2017-11-11 NOTE — Progress Notes (Signed)
Assisted Dr. Hollis with right, ultrasound guided, adductor canal block. Side rails up, monitors on throughout procedure. See vital signs in flow sheet. Tolerated Procedure well.  

## 2017-11-11 NOTE — Discharge Instructions (Addendum)
°  ° °Dr. Frank Aluisio °Total Joint Specialist °[facility] °[address] °[place], [place] [address] °[phone] ° °TOTAL KNEE REPLACEMENT POSTOPERATIVE DIRECTIONS ° °Knee Rehabilitation, Guidelines Following Surgery  °Results after knee surgery are often greatly improved when you follow the exercise, range of motion and muscle strengthening exercises prescribed by your doctor. Safety measures are also important to protect the knee from further injury. Any time any of these exercises cause you to have increased pain or swelling in your knee joint, decrease the amount until you are comfortable again and slowly increase them. If you have problems or questions, call your caregiver or physical therapist for advice.  ° °HOME CARE INSTRUCTIONS  °Remove items at home which could result in a fall. This includes throw rugs or furniture in walking pathways.  °· ICE to the affected knee every three hours for 30 minutes at a time and then as needed for pain and swelling.  Continue to use ice on the knee for pain and swelling from surgery. You may notice swelling that will progress down to the foot and ankle.  This is normal after surgery.  Elevate the leg when you are not up walking on it.   °· Continue to use the breathing machine which will help keep your temperature down.  It is common for your temperature to cycle up and down following surgery, especially at night when you are not up moving around and exerting yourself.  The breathing machine keeps your lungs expanded and your temperature down. °· Do not place pillow under knee, focus on keeping the knee straight while resting ° °DIET °You may resume your previous home diet once your are discharged from the hospital. ° °DRESSING / WOUND CARE / SHOWERING °You may shower 3 days after surgery, but keep the wounds dry during showering.  You may use an occlusive plastic wrap (Press'n Seal for example), NO SOAKING/SUBMERGING IN THE BATHTUB.  If the  bandage gets wet, change with a clean dry gauze.  If the incision gets wet, pat the wound dry with a clean towel. °You may start showering once you are discharged home but do not submerge the incision under water. Just pat the incision dry and apply a dry gauze dressing on daily. °Change the surgical dressing daily and reapply a dry dressing each time. ° °ACTIVITY °Walk with your walker as instructed. °Use walker as long as suggested by your caregivers. °Avoid periods of inactivity such as sitting longer than an hour when not asleep. This helps prevent blood clots.  °You may resume a sexual relationship in one month or when given the OK by your doctor.  °You may return to work once you are cleared by your doctor.  °Do not drive a car for 6 weeks or until released by you surgeon.  °Do not drive while taking narcotics. ° °WEIGHT BEARING °Weight bearing as tolerated with assist device (walker, cane, etc) as directed, use it as long as suggested by your surgeon or therapist, typically at least 4-6 weeks. ° °POSTOPERATIVE CONSTIPATION PROTOCOL °Constipation - defined medically as fewer than three stools per week and severe constipation as less than one stool per week. ° °One of the most common issues patients have following surgery is constipation.  Even if you have a regular bowel pattern at home, your normal regimen is likely to be disrupted due to multiple reasons following surgery.  Combination of anesthesia, postoperative narcotics, change in appetite and fluid intake all can affect your   bowels.  In order to avoid complications following surgery, here are some recommendations in order to help you during your recovery period. ° °Colace (docusate) - Pick up an over-the-counter form of Colace or another stool softener and take twice a day as long as you are requiring postoperative pain medications.  Take with a full glass of water daily.  If you experience loose stools or diarrhea, hold the colace until you stool forms  back up.  If your symptoms do not get better within 1 week or if they get worse, check with your doctor. ° °Dulcolax (bisacodyl) - Pick up over-the-counter and take as directed by the product packaging as needed to assist with the movement of your bowels.  Take with a full glass of water.  Use this product as needed if not relieved by Colace only.  ° °MiraLax (polyethylene glycol) - Pick up over-the-counter to have on hand.  MiraLax is a solution that will increase the amount of water in your bowels to assist with bowel movements.  Take as directed and can mix with a glass of water, juice, soda, coffee, or tea.  Take if you go more than two days without a movement. °Do not use MiraLax more than once per day. Call your doctor if you are still constipated or irregular after using this medication for 7 days in a row. ° °If you continue to have problems with postoperative constipation, please contact the office for further assistance and recommendations.  If you experience "the worst abdominal pain ever" or develop nausea or vomiting, please contact the office immediatly for further recommendations for treatment. ° °ITCHING ° If you experience itching with your medications, try taking only a single pain pill, or even half a pain pill at a time.  You can also use Benadryl over the counter for itching or also to help with sleep.  ° °TED HOSE STOCKINGS °Wear the elastic stockings on both legs for three weeks following surgery during the day but you may remove then at night for sleeping. ° °MEDICATIONS °See your medication summary on the “After Visit Summary” that the nursing staff will review with you prior to discharge.  You may have some home medications which will be placed on hold until you complete the course of blood thinner medication.  It is important for you to complete the blood thinner medication as prescribed by your surgeon.  Continue your approved medications as instructed at time of  discharge. ° °PRECAUTIONS °If you experience chest pain or shortness of breath - call 911 immediately for transfer to the hospital emergency department.  °If you develop a fever greater that 101 F, purulent drainage from wound, increased redness or drainage from wound, foul odor from the wound/dressing, or calf pain - CONTACT YOUR SURGEON.   °                                                °FOLLOW-UP APPOINTMENTS °Make sure you keep all of your appointments after your operation with your surgeon and caregivers. You should call the office at the above phone number and make an appointment for approximately two weeks after the date of your surgery or on the date instructed by your surgeon outlined in the "After Visit Summary". ° ° °RANGE OF MOTION AND STRENGTHENING EXERCISES  °Rehabilitation of the knee is important following a knee   injury or an operation. After just a few days of immobilization, the muscles of the thigh which control the knee become weakened and shrink (atrophy). Knee exercises are designed to build up the tone and strength of the thigh muscles and to improve knee motion. Often times heat used for twenty to thirty minutes before working out will loosen up your tissues and help with improving the range of motion but do not use heat for the first two weeks following surgery. These exercises can be done on a training (exercise) mat, on the floor, on a table or on a bed. Use what ever works the best and is most comfortable for you Knee exercises include:  °Leg Lifts - While your knee is still immobilized in a splint or cast, you can do straight leg raises. Lift the leg to 60 degrees, hold for 3 sec, and slowly lower the leg. Repeat 10-20 times 2-3 times daily. Perform this exercise against resistance later as your knee gets better.  °Quad and Hamstring Sets - Tighten up the muscle on the front of the thigh (Quad) and hold for 5-10 sec. Repeat this 10-20 times hourly. Hamstring sets are done by pushing the  foot backward against an object and holding for 5-10 sec. Repeat as with quad sets.  °· Leg Slides: Lying on your back, slowly slide your foot toward your buttocks, bending your knee up off the floor (only go as far as is comfortable). Then slowly slide your foot back down until your leg is flat on the floor again. °· Angel Wings: Lying on your back spread your legs to the side as far apart as you can without causing discomfort.  °A rehabilitation program following serious knee injuries can speed recovery and prevent re-injury in the future due to weakened muscles. Contact your doctor or a physical therapist for more information on knee rehabilitation.  ° °IF YOU ARE TRANSFERRED TO A SKILLED REHAB FACILITY °If the patient is transferred to a skilled rehab facility following release from the hospital, a list of the current medications will be sent to the facility for the patient to continue.  When discharged from the skilled rehab facility, please have the facility set up the patient's Home Health Physical Therapy prior to being released. Also, the skilled facility will be responsible for providing the patient with their medications at time of release from the facility to include their pain medication, the muscle relaxants, and their blood thinner medication. If the patient is still at the rehab facility at time of the two week follow up appointment, the skilled rehab facility will also need to assist the patient in arranging follow up appointment in our office and any transportation needs. ° °MAKE SURE YOU:  °Understand these instructions.  °Get help right away if you are not doing well or get worse.  ° ° °Pick up stool softner and laxative for home use following surgery while on pain medications. °Do not submerge incision under water. °Please use good hand washing techniques while changing dressing each day. °May shower starting three days after surgery. °Please use a clean towel to pat the incision dry following  showers. °Continue to use ice for pain and swelling after surgery. °Do not use any lotions or creams on the incision until instructed by your surgeon. ° °Take Xarelto for two and a half more weeks following discharge from the hospital, then discontinue Xarelto. °Once the patient has completed the blood thinner regimen, then take a Baby 81 mg Aspirin daily   for three more weeks. ° ° °Information on my medicine - XARELTO® (Rivaroxaban) ° ° °Why was Xarelto® prescribed for you? °Xarelto® was prescribed for you to reduce the risk of blood clots forming after orthopedic surgery. The medical term for these abnormal blood clots is venous thromboembolism (VTE). ° °What do you need to know about xarelto® ? °Take your Xarelto® ONCE DAILY at the same time every day. °You may take it either with or without food. ° °If you have difficulty swallowing the tablet whole, you may crush it and mix in applesauce just prior to taking your dose. ° °Take Xarelto® exactly as prescribed by your doctor and DO NOT stop taking Xarelto® without talking to the doctor who prescribed the medication.  Stopping without other VTE prevention medication to take the place of Xarelto® may increase your risk of developing a clot. ° °After discharge, you should have regular check-up appointments with your healthcare provider that is prescribing your Xarelto®.   ° °What do you do if you miss a dose? °If you miss a dose, take it as soon as you remember on the same day then continue your regularly scheduled once daily regimen the next day. Do not take two doses of Xarelto® on the same day.  ° °Important Safety Information °A possible side effect of Xarelto® is bleeding. You should call your healthcare provider right away if you experience any of the following: °? Bleeding from an injury or your nose that does not stop. °? Unusual colored urine (red or dark brown) or unusual colored stools (red or black). °? Unusual bruising for unknown reasons. °? A serious  fall or if you hit your head (even if there is no bleeding). ° °Some medicines may interact with Xarelto® and might increase your risk of bleeding while on Xarelto®. To help avoid this, consult your healthcare provider or pharmacist prior to using any new prescription or non-prescription medications, including herbals, vitamins, non-steroidal anti-inflammatory drugs (NSAIDs) and supplements. ° °This website has more information on Xarelto®: www.xarelto.com. ° ° °

## 2017-11-11 NOTE — Anesthesia Procedure Notes (Signed)
Anesthesia Regional Block: Adductor canal block   Pre-Anesthetic Checklist: ,, timeout performed, Correct Patient, Correct Site, Correct Laterality, Correct Procedure, Correct Position, site marked, Risks and benefits discussed,  Surgical consent,  Pre-op evaluation,  At surgeon's request and post-op pain management  Laterality: Right  Prep: chloraprep       Needles:  Injection technique: Single-shot  Needle Type: Echogenic Needle     Needle Length: 9cm  Needle Gauge: 21     Additional Needles:   Procedures:,,,, ultrasound used (permanent image in chart),,,,  Narrative:  Start time: 11/11/2017 10:15 AM End time: 11/11/2017 10:25 AM Injection made incrementally with aspirations every 5 mL.  Performed by: Personally  Anesthesiologist: Shelton SilvasHollis, Ra Pfiester D, MD  Additional Notes: Patient tolerated the procedure well. Local anesthetic introduced in an incremental fashion under minimal resistance after negative aspirations. No paresthesias were elicited. After completion of the procedure, no acute issues were identified and patient continued to be monitored by RN.

## 2017-11-12 LAB — CBC
HCT: 37.5 % — ABNORMAL LOW (ref 39.0–52.0)
HEMOGLOBIN: 12.4 g/dL — AB (ref 13.0–17.0)
MCH: 30.8 pg (ref 26.0–34.0)
MCHC: 33.1 g/dL (ref 30.0–36.0)
MCV: 93.3 fL (ref 78.0–100.0)
PLATELETS: 214 10*3/uL (ref 150–400)
RBC: 4.02 MIL/uL — AB (ref 4.22–5.81)
RDW: 14.4 % (ref 11.5–15.5)
WBC: 18.4 10*3/uL — AB (ref 4.0–10.5)

## 2017-11-12 LAB — BASIC METABOLIC PANEL
ANION GAP: 8 (ref 5–15)
BUN: 18 mg/dL (ref 6–20)
CHLORIDE: 103 mmol/L (ref 101–111)
CO2: 23 mmol/L (ref 22–32)
Calcium: 8.7 mg/dL — ABNORMAL LOW (ref 8.9–10.3)
Creatinine, Ser: 1.12 mg/dL (ref 0.61–1.24)
Glucose, Bld: 241 mg/dL — ABNORMAL HIGH (ref 65–99)
POTASSIUM: 4.2 mmol/L (ref 3.5–5.1)
SODIUM: 134 mmol/L — AB (ref 135–145)

## 2017-11-12 LAB — GLUCOSE, CAPILLARY
GLUCOSE-CAPILLARY: 333 mg/dL — AB (ref 65–99)
Glucose-Capillary: 195 mg/dL — ABNORMAL HIGH (ref 65–99)
Glucose-Capillary: 258 mg/dL — ABNORMAL HIGH (ref 65–99)
Glucose-Capillary: 337 mg/dL — ABNORMAL HIGH (ref 65–99)

## 2017-11-12 MED ORDER — METFORMIN HCL 500 MG PO TABS
1000.0000 mg | ORAL_TABLET | Freq: Two times a day (BID) | ORAL | Status: DC
  Administered 2017-11-12 – 2017-11-13 (×2): 1000 mg via ORAL
  Filled 2017-11-12 (×2): qty 2

## 2017-11-12 MED ORDER — METFORMIN HCL 500 MG PO TABS: 1000.0000 mg | ORAL_TABLET | Freq: Two times a day (BID) | ORAL | Status: DC

## 2017-11-12 MED ORDER — OXYCODONE HCL 5 MG PO TABS: 5.0000 mg | ORAL_TABLET | ORAL | 0 refills | Status: DC | PRN

## 2017-11-12 MED ORDER — TRAMADOL HCL 50 MG PO TABS: 50.0000 mg | ORAL_TABLET | Freq: Three times a day (TID) | ORAL | 0 refills | Status: DC | PRN

## 2017-11-12 MED ORDER — METHOCARBAMOL 500 MG PO TABS: 500.0000 mg | ORAL_TABLET | Freq: Four times a day (QID) | ORAL | 0 refills | Status: DC | PRN

## 2017-11-12 MED ORDER — RIVAROXABAN 10 MG PO TABS: 10.0000 mg | ORAL_TABLET | Freq: Every day | ORAL | 0 refills | Status: DC

## 2017-11-12 MED ORDER — TRAMADOL HCL 50 MG PO TABS: 50.0000 mg | ORAL_TABLET | Freq: Three times a day (TID) | ORAL | Status: DC | PRN

## 2017-11-12 NOTE — Discharge Summary (Signed)
Physician Discharge Summary   Patient ID: Joshua [name] MRN: [id number] DOB/AGE: [birth year] 81 y.o.  Admit date: 11/11/2017 Discharge date: 11/13/2017  Primary Diagnosis:  Osteoarthritis  Right knee(s)   Admission Diagnoses:  Past Medical History:  Diagnosis Date  . Anemia    hx.  . Arthritis   . Diabetes mellitus without complication ([place])    pt.states that he does not ever check blood sugars at home  . Headache   . High cholesterol   . History of kidney stones   . Hypertension   . Pneumonia    years ago   Discharge Diagnoses:   Principal Problem:   OA (osteoarthritis) of knee  Estimated body mass index is 26.85 kg/m as calculated from the following:   Height as of this encounter: 6' (1.829 m).   Weight as of this encounter: 89.8 kg (198 lb).  Procedure:  Procedure(s) (LRB): RIGHT TOTAL KNEE ARTHROPLASTY (Right)   Consults: None  HPI: Joshua [name] is a 81 y.o. year old male with end stage OA of his right knee with progressively worsening pain and dysfunction. He has constant pain, with activity and at rest and significant functional deficits with difficulties even with ADLs. He has had extensive non-op management including analgesics, injections of cortisone and viscosupplements, and home exercise program, but remains in significant pain with significant dysfunction. Radiographs show bone on bone arthritis medial and patellofemoral. He presents now for right Total Knee Arthroplasty.     Laboratory Data: Admission on 11/11/2017  Component Date Value Ref Range Status  . ABO/RH(D) 11/11/2017 A NEG   Final  . Antibody Screen 11/11/2017 POS   Final  . Sample Expiration 11/11/2017 11/14/2017   Final  . Glucose-Capillary 11/11/2017 167* 65 - 99 mg/dL Final  . Comment 1 11/11/2017 Notify RN   Final  . Glucose-Capillary 11/11/2017 169* 65 - 99 mg/dL Final  . Comment 1 11/11/2017 Document in Chart   Final  . WBC 11/12/2017 18.4* 4.0 - 10.5 K/uL Final  . RBC 11/12/2017  4.02* 4.22 - 5.81 MIL/uL Final  . Hemoglobin 11/12/2017 12.4* 13.0 - 17.0 g/dL Final  . HCT 11/12/2017 37.5* 39.0 - 52.0 % Final  . MCV 11/12/2017 93.3  78.0 - 100.0 fL Final  . MCH 11/12/2017 30.8  26.0 - 34.0 pg Final  . MCHC 11/12/2017 33.1  30.0 - 36.0 g/dL Final  . RDW 11/12/2017 14.4  11.5 - 15.5 % Final  . Platelets 11/12/2017 214  150 - 400 K/uL Final  . Sodium 11/12/2017 134* 135 - 145 mmol/L Final  . Potassium 11/12/2017 4.2  3.5 - 5.1 mmol/L Final  . Chloride 11/12/2017 103  101 - 111 mmol/L Final  . CO2 11/12/2017 23  22 - 32 mmol/L Final  . Glucose, Bld 11/12/2017 241* 65 - 99 mg/dL Final  . BUN 11/12/2017 18  6 - 20 mg/dL Final  . Creatinine, Ser 11/12/2017 1.12  0.61 - 1.24 mg/dL Final  . Calcium 11/12/2017 8.7* 8.9 - 10.3 mg/dL Final  . GFR calc non Af Amer 11/12/2017 >60  >60 mL/min Final  . GFR calc Af Amer 11/12/2017 >60  >60 mL/min Final   Comment: (NOTE) The eGFR has been calculated using the CKD EPI equation. This calculation has not been validated in all clinical situations. eGFR's persistently <60 mL/min signify possible Chronic Kidney Disease.   . Anion gap 11/12/2017 8  5 - 15 Final  . Glucose-Capillary 11/11/2017 266* 65 - 99 mg/dL Final  . Glucose-Capillary  11/11/2017 326* 65 - 99 mg/dL Final  . Glucose-Capillary 11/12/2017 195* 65 - 99 mg/dL Final  . Glucose-Capillary 11/12/2017 258* 65 - 99 mg/dL Final  . Glucose-Capillary 11/12/2017 333* 65 - 99 mg/dL Final  . Glucose-Capillary 11/12/2017 337* 65 - 99 mg/dL Final  Hospital Outpatient Visit on 11/07/2017  Component Date Value Ref Range Status  . aPTT 11/07/2017 28  24 - 36 seconds Final  . WBC 11/07/2017 9.8  4.0 - 10.5 K/uL Final  . RBC 11/07/2017 4.51  4.22 - 5.81 MIL/uL Final  . Hemoglobin 11/07/2017 14.4  13.0 - 17.0 g/dL Final  . HCT 11/07/2017 43.0  39.0 - 52.0 % Final  . MCV 11/07/2017 95.3  78.0 - 100.0 fL Final  . MCH 11/07/2017 31.9  26.0 - 34.0 pg Final  . MCHC 11/07/2017 33.5  30.0 -  36.0 g/dL Final  . RDW 11/07/2017 14.6  11.5 - 15.5 % Final  . Platelets 11/07/2017 214  150 - 400 K/uL Final  . Sodium 11/07/2017 138  135 - 145 mmol/L Final  . Potassium 11/07/2017 4.0  3.5 - 5.1 mmol/L Final  . Chloride 11/07/2017 105  101 - 111 mmol/L Final  . CO2 11/07/2017 26  22 - 32 mmol/L Final  . Glucose, Bld 11/07/2017 143* 65 - 99 mg/dL Final  . BUN 11/07/2017 16  6 - 20 mg/dL Final  . Creatinine, Ser 11/07/2017 1.17  0.61 - 1.24 mg/dL Final  . Calcium 11/07/2017 9.2  8.9 - 10.3 mg/dL Final  . Total Protein 11/07/2017 7.4  6.5 - 8.1 g/dL Final  . Albumin 11/07/2017 3.9  3.5 - 5.0 g/dL Final  . AST 11/07/2017 16  15 - 41 U/L Final  . ALT 11/07/2017 18  17 - 63 U/L Final  . Alkaline Phosphatase 11/07/2017 89  38 - 126 U/L Final  . Total Bilirubin 11/07/2017 0.6  0.3 - 1.2 mg/dL Final  . GFR calc non Af Amer 11/07/2017 57* >60 mL/min Final  . GFR calc Af Amer 11/07/2017 >60  >60 mL/min Final   Comment: (NOTE) The eGFR has been calculated using the CKD EPI equation. This calculation has not been validated in all clinical situations. eGFR's persistently <60 mL/min signify possible Chronic Kidney Disease.   . Anion gap 11/07/2017 7  5 - 15 Final  . Prothrombin Time 11/07/2017 12.4  11.4 - 15.2 seconds Final  . INR 11/07/2017 0.93   Final  . ABO/RH(D) 11/07/2017 A NEG   Final  . Antibody Screen 11/07/2017 POS   Final  . Sample Expiration 11/07/2017 11/10/2017   Final  . Antibody Identification 02/63/7858 ANTI D   Final  . MRSA, PCR 11/07/2017 NEGATIVE  NEGATIVE Final  . Staphylococcus aureus 11/07/2017 NEGATIVE  NEGATIVE Final   Comment: (NOTE) The Xpert SA Assay (FDA approved for NASAL specimens in patients 56 years of age and older), is one component of a comprehensive surveillance program. It is not intended to diagnose infection nor to guide or monitor treatment.   . Glucose-Capillary 11/07/2017 167* 65 - 99 mg/dL Final     X-Rays:No results found.  EKG: Orders  placed or performed during the hospital encounter of 07/02/17  . EKG 12 lead  . EKG 12 lead     Hospital Course: Joshua [name] is a 81 y.o. who was admitted to [facility]. They were brought to the operating room on 11/11/2017 and underwent Procedure(s): RIGHT TOTAL KNEE ARTHROPLASTY.  Patient tolerated the procedure well and was  later transferred to the recovery room and then to the orthopaedic floor for postoperative care.  They were given PO and IV analgesics for pain control following their surgery.  They were given 24 hours of postoperative antibiotics of  Anti-infectives (From admission, onward)   Start     Dose/Rate Route Frequency Ordered Stop   11/11/17 1800  ceFAZolin (ANCEF) IVPB 2g/100 mL premix     2 g 200 mL/hr over 30 Minutes Intravenous Every 6 hours 11/11/17 1624 11/12/17 0119   11/11/17 0907  ceFAZolin (ANCEF) 2-4 GM/100ML-% IVPB    Comments:  Harvell, Gwendolyn  : cabinet override      11/11/17 0907 11/11/17 1127   11/11/17 0859  ceFAZolin (ANCEF) IVPB 2g/100 mL premix     2 g 200 mL/hr over 30 Minutes Intravenous On call to O.R. 11/11/17 7622 11/11/17 1127     and started on DVT prophylaxis in the form of Xarelto.   PT and OT were ordered for total joint protocol.  Discharge planning consulted to help with postop disposition and equipment needs.  Patient had a tough night on the evening of surgery.  They started to get up OOB with therapy on day one. Hemovac drain was pulled without difficulty.  Continued to work with therapy into day two.  Dressing was changed on day two and the incision was healing well.  Patient was seen in rounds on day two and was ready to go home.  Diet - Cardiac diet and Diabetic diet Follow up - in 2 weeks Activity - WBAT Disposition - Home Condition Upon Discharge - Good D/C Meds - See DC Summary DVT Prophylaxis - Xarelto     Discharge Instructions    Call MD / Call 911   Complete by:  As directed    If you experience chest  pain or shortness of breath, CALL 911 and be transported to the hospital emergency room.  If you develope a fever above 101 F, pus (white drainage) or increased drainage or redness at the wound, or calf pain, call your surgeon's office.   Change dressing   Complete by:  As directed    Change dressing daily with sterile 4 x 4 inch gauze dressing and apply TED hose. Do not submerge the incision under water.   Constipation Prevention   Complete by:  As directed    Drink plenty of fluids.  Prune juice may be helpful.  You may use a stool softener, such as Colace (over the counter) 100 mg twice a day.  Use MiraLax (over the counter) for constipation as needed.   Diet - low sodium heart healthy   Complete by:  As directed    Diet Carb Modified   Complete by:  As directed    Discharge instructions   Complete by:  As directed    Take Xarelto for two and a half more weeks, then discontinue Xarelto. Once the patient has completed the blood thinner regimen, then take a Baby 81 mg Aspirin daily for three more weeks.   Pick up stool softner and laxative for home use following surgery while on pain medications. Do not submerge incision under water. Please use good hand washing techniques while changing dressing each day. May shower starting three days after surgery. Please use a clean towel to pat the incision dry following showers. Continue to use ice for pain and swelling after surgery. Do not use any lotions or creams on the incision until instructed by your surgeon.  Wear  both TED hose on both legs during the day every day for three weeks, but may remove the TED hose at night at home.  Postoperative Constipation Protocol  Constipation - defined medically as fewer than three stools per week and severe constipation as less than one stool per week.  One of the most common issues patients have following surgery is constipation.  Even if you have a regular bowel pattern at home, your normal regimen  is likely to be disrupted due to multiple reasons following surgery.  Combination of anesthesia, postoperative narcotics, change in appetite and fluid intake all can affect your bowels.  In order to avoid complications following surgery, here are some recommendations in order to help you during your recovery period.  Colace (docusate) - Pick up an over-the-counter form of Colace or another stool softener and take twice a day as long as you are requiring postoperative pain medications.  Take with a full glass of water daily.  If you experience loose stools or diarrhea, hold the colace until you stool forms back up.  If your symptoms do not get better within 1 week or if they get worse, check with your doctor.  Dulcolax (bisacodyl) - Pick up over-the-counter and take as directed by the product packaging as needed to assist with the movement of your bowels.  Take with a full glass of water.  Use this product as needed if not relieved by Colace only.   MiraLax (polyethylene glycol) - Pick up over-the-counter to have on hand.  MiraLax is a solution that will increase the amount of water in your bowels to assist with bowel movements.  Take as directed and can mix with a glass of water, juice, soda, coffee, or tea.  Take if you go more than two days without a movement. Do not use MiraLax more than once per day. Call your doctor if you are still constipated or irregular after using this medication for 7 days in a row.  If you continue to have problems with postoperative constipation, please contact the office for further assistance and recommendations.  If you experience "the worst abdominal pain ever" or develop nausea or vomiting, please contact the office immediatly for further recommendations for treatment.   Do not put a pillow under the knee. Place it under the heel.   Complete by:  As directed    Do not sit on low chairs, stoools or toilet seats, as it may be difficult to get up from low surfaces    Complete by:  As directed    Driving restrictions   Complete by:  As directed    No driving until released by the physician.   Increase activity slowly as tolerated   Complete by:  As directed    Lifting restrictions   Complete by:  As directed    No lifting until released by the physician.   Patient may shower   Complete by:  As directed    You may shower without a dressing once there is no drainage.  Do not wash over the wound.  If drainage remains, do not shower until drainage stops.   TED hose   Complete by:  As directed    Use stockings (TED hose) for 3 weeks on both leg(s).  You may remove them at night for sleeping.   Weight bearing as tolerated   Complete by:  As directed    Laterality:  right   Extremity:  Lower     Allergies as of  11/12/2017   No Known Allergies     Medication List    STOP taking these medications   GOODYS BODY PAIN PO   oxyCODONE-acetaminophen 5-325 MG tablet Commonly known as:  PERCOCET/ROXICET     TAKE these medications   allopurinol 300 MG tablet Commonly known as:  ZYLOPRIM Take 300 mg by mouth 2 (two) times daily.   amLODipine 10 MG tablet Commonly known as:  NORVASC Take 10 mg by mouth daily.   fluticasone 50 MCG/ACT nasal spray Commonly known as:  FLONASE Place 2 sprays into both nostrils at bedtime.   lisinopril 40 MG tablet Commonly known as:  PRINIVIL,ZESTRIL Take 40 mg by mouth daily.   lovastatin 20 MG tablet Commonly known as:  MEVACOR Take 20 mg by mouth at bedtime.   metFORMIN 500 MG tablet Commonly known as:  GLUCOPHAGE Take 1,000 mg by mouth 2 (two) times daily.   methocarbamol 500 MG tablet Commonly known as:  ROBAXIN Take 1 tablet (500 mg total) by mouth every 6 (six) hours as needed for muscle spasms.   metoprolol tartrate 100 MG tablet Commonly known as:  LOPRESSOR Take 100 mg by mouth daily.   oxyCODONE 5 MG immediate release tablet Commonly known as:  Oxy IR/ROXICODONE Take 1-2 tablets (5-10 mg  total) by mouth every 4 (four) hours as needed for moderate pain or severe pain.   prednisoLONE acetate 1 % ophthalmic suspension Commonly known as:  PRED FORTE Place 1 drop 4 (four) times daily into the left eye. What changed:  how to take this   rivaroxaban 10 MG Tabs tablet Commonly known as:  XARELTO Take 1 tablet (10 mg total) by mouth daily with breakfast. Take Xarelto for two and a half more weeks following discharge from the hospital, then discontinue Xarelto. Once the patient has completed the blood thinner regimen, then take a Baby 81 mg Aspirin daily for three more weeks. Start taking on:  11/13/2017   traMADol 50 MG tablet Commonly known as:  ULTRAM Take 1-2 tablets (50-100 mg total) by mouth every 8 (eight) hours as needed for moderate pain. What changed:  when to take this            Discharge Care Instructions  (From admission, onward)        Start     Ordered   11/12/17 0000  Weight bearing as tolerated    Question Answer Comment  Laterality right   Extremity Lower      11/12/17 2055   11/12/17 0000  Change dressing    Comments:  Change dressing daily with sterile 4 x 4 inch gauze dressing and apply TED hose. Do not submerge the incision under water.   11/12/17 2055     Follow-up Information    [facility] Follow up.   Specialty:  [place]:  physical therapy Contact information: [place] [address] [phone]        Gaynelle Arabian, MD. Schedule an appointment as soon as possible for a visit on 11/26/2017.   Specialty:  Orthopedic Surgery Contact information: [address] [place] [address] [phone]           Signed: Arlee Muslim, PA-C Orthopaedic Surgery 11/12/2017, 8:56 PM

## 2017-11-12 NOTE — Progress Notes (Signed)
Inpatient Diabetes Program Recommendations  AACE/ADA: New Consensus Statement on Inpatient Glycemic Control (2015)  Target Ranges:  Prepandial:   less than 140 mg/dL      Peak postprandial:   less than 180 mg/dL (1-2 hours)      Critically ill patients:  140 - 180 mg/dL    Results for Joshua [name], Joshua [name] (MRN [id number]) as of 11/12/2017 09:29  Ref. Range 11/11/2017 09:32 11/11/2017 12:52 11/11/2017 17:14 11/11/2017 22:30  Glucose-Capillary Latest Ref Range: 65 - 99 mg/dL 409167 (H) 811169 (H) 914266 (H) 326 (H)   Admit with: R Total Knee  History: DM  Home DM Meds: Metformin 1000 mg BID  Current Insulin Orders: None      MD- Please place orders for Novolog Moderate Correction Scale/ SSI (0-15 units) TID AC + HS      --Will follow patient during hospitalization--  Ambrose FinlandJeannine Johnston Rhianna Raulerson RN, MSN, CDE Diabetes Coordinator Inpatient Glycemic Control Team Team Pager: [phone] (8a-5p)

## 2017-11-12 NOTE — Progress Notes (Signed)
Occupational Therapy Evaluation Patient Details Name: Joshua MannanLarry D [name] MRN: [id number] DOB: [birth year] Today's Date: 11/12/2017    History of Present Illness Pt is a 81 year old male s/p R TKA   Clinical Impression   Patient presents to OT with decreased ADL independence and safety due to the deficits listed below. He will benefit from skilled OT to maximize function and to facilitate a safe discharge. Patient plans to d/c home with assistance from his spouse, who was assisting him prior to surgery. OT will continue to follow.    Follow Up Recommendations  DC plan and follow up therapy as arranged by surgeon;Supervision/Assistance - 24 hour    Equipment Recommendations  3 in 1 bedside commode; possibly a tub/shower seat   Recommendations for Other Services       Precautions / Restrictions Precautions Precautions: Fall;Knee Required Braces or Orthoses: Knee Immobilizer - Right Knee Immobilizer - Right: Discontinue once straight leg raise with < 10 degree lag Restrictions Weight Bearing Restrictions: No Other Position/Activity Restrictions: WBAT      Mobility Bed Mobility            General bed mobility comments: NT -- up in recliner upon arrival  Transfers Overall transfer level: Needs assistance Equipment used: Rolling walker (2 wheeled) Transfers: Sit to/from Stand Sit to Stand: Min assist         General transfer comment: verbal cues for UE and LE positioning, assist for rise and steady    Balance                                           ADL either performed or assessed with clinical judgement   ADL Overall ADL's : Needs assistance/impaired Eating/Feeding: Independent;Sitting   Grooming: Set up;Sitting           Upper Body Dressing : Set up;Sitting   Lower Body Dressing: Moderate assistance;Sit to/from stand   Toilet Transfer: Minimal assistance;Min guard;Ambulation;BSC;RW   Toileting- Clothing Manipulation and Hygiene:  Minimal assistance;Sit to/from stand   Tub/ Shower Transfer: Tub transfer;Minimal assistance;Cueing for sequencing;Ambulation;Rolling walker Tub/Shower Transfer Details (indicate cue type and reason): simulated in walk-in shower in bathroom Functional mobility during ADLs: Minimal assistance;Min guard;Rolling walker General ADL Comments: Educated on LB self-care techniques. Wife to assist as needed. Practiced toilet and shower transfers. Needs reinforcement for safety. Patient requesting 3 in 1 for home use. May also benefit from tub bench.     Vision         Perception     Praxis      Pertinent Vitals/Pain Pain Assessment: 0-10 Pain Score: 4  Pain Location: right knee with mobility Pain Descriptors / Indicators: Burning Pain Intervention(s): Limited activity within patient's tolerance;Monitored during session;Repositioned;Ice applied;Patient requesting pain meds-RN notified     Hand Dominance Right   Extremity/Trunk Assessment Upper Extremity Assessment Upper Extremity Assessment: Overall WFL for tasks assessed   Lower Extremity Assessment Lower Extremity Assessment: Defer to PT evaluation        Communication Communication Communication: HOH   Cognition Arousal/Alertness: Awake/alert Behavior During Therapy: WFL for tasks assessed/performed Overall Cognitive Status: Within Functional Limits for tasks assessed                                     General Comments  Exercises    Shoulder Instructions      Home Living Family/patient expects to be discharged to:: Private residence Living Arrangements: Spouse/significant other Available Help at Discharge: Family Type of Home: House Home Access: Stairs to enter Secretary/administratorntrance Stairs-Number of Steps: 3 Entrance Stairs-Rails: Right Home Layout: One level     Bathroom Shower/Tub: Chief Strategy OfficerTub/shower unit   Bathroom Toilet: Handicapped height Bathroom Accessibility: Yes How Accessible: Accessible via  walker Home Equipment: None          Prior Functioning/Environment Level of Independence: Independent        Comments: likes to work in the yard        OT Problem List: Decreased strength;Decreased range of motion;Decreased activity tolerance;Decreased knowledge of use of DME or AE;Pain      OT Treatment/Interventions: Self-care/ADL training;DME and/or AE instruction;Therapeutic activities;Patient/family education    OT Goals(Current goals can be found in the care plan section) Acute Rehab OT Goals Patient Stated Goal: regain independence so I can do yardwork OT Goal Formulation: With patient Time For Goal Achievement: 11/26/17 Potential to Achieve Goals: Good ADL Goals Pt Will Perform Lower Body Bathing: with min assist;with caregiver independent in assisting;sit to/from stand Pt Will Perform Lower Body Dressing: with min assist;with caregiver independent in assisting;sit to/from stand Pt Will Transfer to Toilet: with supervision;ambulating;bedside commode Pt Will Perform Toileting - Clothing Manipulation and hygiene: with supervision;sit to/from stand Pt Will Perform Tub/Shower Transfer: Tub transfer;with min assist;with caregiver independent in assisting;ambulating;tub bench;rolling walker  OT Frequency: Min 2X/week   Barriers to D/C:            Co-evaluation              AM-PAC PT "6 Clicks" Daily Activity     Outcome Measure Help from another person eating meals?: None Help from another person taking care of personal grooming?: A Little Help from another person toileting, which includes using toliet, bedpan, or urinal?: A Little Help from another person bathing (including washing, rinsing, drying)?: A Lot Help from another person to put on and taking off regular upper body clothing?: A Little Help from another person to put on and taking off regular lower body clothing?: A Lot 6 Click Score: 17   End of Session Equipment Utilized During Treatment: Rolling  walker;Right knee immobilizer Nurse Communication: Mobility status;Patient requests pain meds  Activity Tolerance: Patient tolerated treatment well Patient left: in chair;with call bell/phone within reach;with family/visitor present  OT Visit Diagnosis: Unsteadiness on feet (R26.81);Muscle weakness (generalized) (M62.81)                Time: [id number] OT Time Calculation (min): 20 min Charges:  OT General Charges $OT Visit: 1 Visit OT Evaluation $OT Eval Low Complexity: 1 Low G-Codes:       Amya Hlad A Keone Kamer 11/12/2017, 11:48 AM

## 2017-11-12 NOTE — Progress Notes (Signed)
   Subjective: 1 Day Post-Op Procedure(s) (LRB): RIGHT TOTAL KNEE ARTHROPLASTY (Right) Patient reports pain as mild.   Patient seen in rounds for Dr. Lequita HaltAluisio.  Hard of hearing. Patient is well, but has had some minor complaints of pain in the knee, requiring pain medications We will start therapy today.  Plan is to go Home after hospital stay.  Objective: Vital signs in last 24 hours: Temp:  [97.5 F (36.4 C)-98.2 F (36.8 C)] 97.6 F (36.4 C) (12/04 0926) Pulse Rate:  [52-81] 79 (12/04 0926) Resp:  [13-19] 16 (12/04 0926) BP: (104-168)/(53-85) 126/54 (12/04 0926) SpO2:  [92 %-100 %] 92 % (12/04 0926)  Intake/Output from previous day:  Intake/Output Summary (Last 24 hours) at 11/12/2017 1217 Last data filed at 11/12/2017 1139 Gross per 24 hour  Intake 4300.67 ml  Output 4265 ml  Net 35.67 ml    Intake/Output this shift: Total I/O In: 360 [P.O.:360] Out: 100 [Urine:100]  Labs: Recent Labs    11/12/17 0512  HGB 12.4*   Recent Labs    11/12/17 0512  WBC 18.4*  RBC 4.02*  HCT 37.5*  PLT 214   Recent Labs    11/12/17 0512  NA 134*  K 4.2  CL 103  CO2 23  BUN 18  CREATININE 1.12  GLUCOSE 241*  CALCIUM 8.7*   No results for input(s): LABPT, INR in the last 72 hours.  EXAM General - Patient is Alert, Appropriate and Oriented Extremity - Neurovascular intact Sensation intact distally Intact pulses distally Dorsiflexion/Plantar flexion intact Dressing - dressing C/D/I Motor Function - intact, moving foot and toes well on exam.  Hemovac pulled without difficulty.  Past Medical History:  Diagnosis Date  . Anemia    hx.  . Arthritis   . Diabetes mellitus without complication (HCC)    pt.states that he does not ever check blood sugars at home  . Headache   . High cholesterol   . History of kidney stones   . Hypertension   . Pneumonia    years ago    Assessment/Plan: 1 Day Post-Op Procedure(s) (LRB): RIGHT TOTAL KNEE ARTHROPLASTY  (Right) Principal Problem:   OA (osteoarthritis) of knee  Estimated body mass index is 26.85 kg/m as calculated from the following:   Height as of this encounter: 6' (1.829 m).   Weight as of this encounter: 89.8 kg (198 lb). Advance diet Up with therapy Plan for discharge tomorrow  DVT Prophylaxis - Xarelto Weight-Bearing as tolerated to right leg D/C O2 and Pulse OX and try on Room Air  Avel Peacerew Alphonza Tramell, PA-C Orthopaedic Surgery 11/12/2017, 12:17 PM

## 2017-11-12 NOTE — Evaluation (Signed)
Physical Therapy Evaluation Patient Details Name: Joshua MannanLarry D Stalling MRN: [id number] DOB: [birth year] Today's Date: 11/12/2017   History of Present Illness  Pt is a 81 year old male s/p R TKA  Clinical Impression  Pt is s/p R TKA resulting in the deficits listed below (see PT Problem List).  Pt will benefit from skilled PT to increase their independence and safety with mobility to allow discharge to the venue listed below.  Pt assisted with ambulating in hallway and performed LE exercises upon return to recliner.  Pt plans to d/c home with spouse.     Follow Up Recommendations Home health PT    Equipment Recommendations  Rolling walker with 5" wheels    Recommendations for Other Services       Precautions / Restrictions Precautions Precautions: Fall;Knee Required Braces or Orthoses: Knee Immobilizer - Right Restrictions Weight Bearing Restrictions: No Other Position/Activity Restrictions: WBAT      Mobility  Bed Mobility Overal bed mobility: Needs Assistance Bed Mobility: Supine to Sit     Supine to sit: Min guard;HOB elevated     General bed mobility comments: verbal cues for technique  Transfers Overall transfer level: Needs assistance Equipment used: Rolling walker (2 wheeled) Transfers: Sit to/from Stand Sit to Stand: Min assist         General transfer comment: verbal cues for UE and LE positioning, assist for rise and steady  Ambulation/Gait Ambulation/Gait assistance: Min guard Ambulation Distance (Feet): 80 Feet Assistive device: Rolling walker (2 wheeled) Gait Pattern/deviations: Step-to pattern;Decreased stance time - right;Antalgic     General Gait Details: verbal cues for sequence, RW positioning, posture  Stairs            Wheelchair Mobility    Modified Rankin (Stroke Patients Only)       Balance                                             Pertinent Vitals/Pain Pain Assessment: 0-10 Pain Score: 3  Pain  Descriptors / Indicators: Aching;Sore Pain Intervention(s): Limited activity within patient's tolerance;Repositioned;Monitored during session;Ice applied    Home Living Family/patient expects to be discharged to:: Private residence Living Arrangements: Spouse/significant other   Type of Home: House Home Access: Stairs to enter Entrance Stairs-Rails: Right Entrance Stairs-Number of Steps: 3 Home Layout: One level Home Equipment: None      Prior Function Level of Independence: Independent               Hand Dominance        Extremity/Trunk Assessment        Lower Extremity Assessment Lower Extremity Assessment: RLE deficits/detail RLE Deficits / Details: unable to perform SLR, fair quad contraction, AAROM R knee flexion 50*       Communication   Communication: HOH  Cognition Arousal/Alertness: Awake/alert Behavior During Therapy: WFL for tasks assessed/performed Overall Cognitive Status: Within Functional Limits for tasks assessed                                        General Comments      Exercises Total Joint Exercises Ankle Circles/Pumps: AROM;Both;10 reps Quad Sets: AROM;Right;10 reps Short Arc Quad: AROM;Right;10 reps Heel Slides: AAROM;10 reps;Right Hip ABduction/ADduction: AROM;Right;10 reps Straight Leg Raises: AAROM;Right;10 reps   Assessment/Plan  PT Assessment Patient needs continued PT services  PT Problem List Decreased strength;Decreased mobility;Decreased range of motion;Decreased knowledge of use of DME;Pain;Decreased knowledge of precautions       PT Treatment Interventions Functional mobility training;Stair training;Therapeutic exercise;Gait training;Patient/family education;DME instruction;Therapeutic activities    PT Goals (Current goals can be found in the Care Plan section)  Acute Rehab PT Goals PT Goal Formulation: With patient Time For Goal Achievement: 11/16/17 Potential to Achieve Goals: Good     Frequency 7X/week   Barriers to discharge        Co-evaluation               AM-PAC PT "6 Clicks" Daily Activity  Outcome Measure Difficulty turning over in bed (including adjusting bedclothes, sheets and blankets)?: None Difficulty moving from lying on back to sitting on the side of the bed? : A Little Difficulty sitting down on and standing up from a chair with arms (e.g., wheelchair, bedside commode, etc,.)?: A Little Help needed moving to and from a bed to chair (including a wheelchair)?: A Little Help needed walking in hospital room?: A Little Help needed climbing 3-5 steps with a railing? : A Lot 6 Click Score: 18    End of Session Equipment Utilized During Treatment: Gait belt;Right knee immobilizer Activity Tolerance: Patient tolerated treatment well Patient left: in chair;with call bell/phone within reach;with family/visitor present   PT Visit Diagnosis: Other abnormalities of gait and mobility (R26.89)    Time: 1610-96040943-1006 PT Time Calculation (min) (ACUTE ONLY): 23 min   Charges:   PT Evaluation $PT Eval Low Complexity: 1 Low PT Treatments $Therapeutic Exercise: 8-22 mins   PT G Codes:        Zenovia JarredKati Rino Hosea, PT, DPT 11/12/2017 Pager: [phone]  Maida SaleLEMYRE,KATHrine E 11/12/2017, 10:12 AM

## 2017-11-12 NOTE — Progress Notes (Signed)
Physical Therapy Treatment Patient Details Name: Joshua [name] MRN: [id number] DOB: Apr 06, 1936 Today's Date: 11/12/2017    History of Present Illness Pt is a 81 year old male s/p R TKA    PT Comments    Pt ambulated again in hallway and assisted back to bed.   Follow Up Recommendations  Home health PT     Equipment Recommendations  Rolling walker with 5" wheels    Recommendations for Other Services       Precautions / Restrictions Precautions Precautions: Fall;Knee Required Braces or Orthoses: Knee Immobilizer - Right Knee Immobilizer - Right: Discontinue once straight leg raise with < 10 degree lag Restrictions Other Position/Activity Restrictions: WBAT    Mobility  Bed Mobility Overal bed mobility: Needs Assistance Bed Mobility: Sit to Supine       Sit to supine: Min guard   General bed mobility comments: min/guard for safety  Transfers Overall transfer level: Needs assistance Equipment used: Rolling walker (2 wheeled) Transfers: Sit to/from Stand Sit to Stand: Min guard         General transfer comment: verbal cues for UE and LE positioning  Ambulation/Gait Ambulation/Gait assistance: Min guard Ambulation Distance (Feet): 160 Feet Assistive device: Rolling walker (2 wheeled) Gait Pattern/deviations: Step-to pattern;Decreased stance time - right;Antalgic     General Gait Details: verbal cues for sequence, RW positioning, posture   Stairs            Wheelchair Mobility    Modified Rankin (Stroke Patients Only)       Balance                                            Cognition Arousal/Alertness: Awake/alert Behavior During Therapy: WFL for tasks assessed/performed Overall Cognitive Status: Within Functional Limits for tasks assessed                                        Exercises      General Comments        Pertinent Vitals/Pain Pain Assessment: 0-10 Pain Score: 4  Pain Location:  right knee with mobility Pain Descriptors / Indicators: Burning;Sore Pain Intervention(s): Limited activity within patient's tolerance;Repositioned;Monitored during session    Home Living                      Prior Function            PT Goals (current goals can now be found in the care plan section) Progress towards PT goals: Progressing toward goals    Frequency    7X/week      PT Plan Current plan remains appropriate    Co-evaluation              AM-PAC PT "6 Clicks" Daily Activity  Outcome Measure  Difficulty turning over in bed (including adjusting bedclothes, sheets and blankets)?: None Difficulty moving from lying on back to sitting on the side of the bed? : A Little Difficulty sitting down on and standing up from a chair with arms (e.g., wheelchair, bedside commode, etc,.)?: A Little Help needed moving to and from a bed to chair (including a wheelchair)?: A Little Help needed walking in hospital room?: A Little Help needed climbing 3-5 steps with a railing? : A Little 6 Click  Score: 19    End of Session Equipment Utilized During Treatment: Right knee immobilizer Activity Tolerance: Patient tolerated treatment well Patient left: with call bell/phone within reach;in bed;with bed alarm set   PT Visit Diagnosis: Other abnormalities of gait and mobility (R26.89)     Time: [id number] PT Time Calculation (min) (ACUTE ONLY): 13 min  Charges:  $Gait Training: 8-22 mins                    G Codes:       Zenovia JarredKati Thomson Herbers, PT, DPT 11/12/2017 Pager: [phone]  Maida SaleLEMYRE,KATHrine E 11/12/2017, 3:22 PM

## 2017-11-12 NOTE — Progress Notes (Signed)
Made Michaelene Songrew Perkins,PA aware of patients blood sugar of 330 this evening and that patients metformin 1000mg  twice a day wasn't ordered. PA ordered metformin 1000mg  twice a day to start now.

## 2017-11-13 LAB — CBC
HEMATOCRIT: 35 % — AB (ref 39.0–52.0)
HEMOGLOBIN: 11.5 g/dL — AB (ref 13.0–17.0)
MCH: 30.8 pg (ref 26.0–34.0)
MCHC: 32.9 g/dL (ref 30.0–36.0)
MCV: 93.8 fL (ref 78.0–100.0)
Platelets: 182 10*3/uL (ref 150–400)
RBC: 3.73 MIL/uL — AB (ref 4.22–5.81)
RDW: 14.7 % (ref 11.5–15.5)
WBC: 18.5 10*3/uL — ABNORMAL HIGH (ref 4.0–10.5)

## 2017-11-13 LAB — BASIC METABOLIC PANEL
Anion gap: 7 (ref 5–15)
BUN: 27 mg/dL — AB (ref 6–20)
CHLORIDE: 105 mmol/L (ref 101–111)
CO2: 24 mmol/L (ref 22–32)
CREATININE: 1.37 mg/dL — AB (ref 0.61–1.24)
Calcium: 9.2 mg/dL (ref 8.9–10.3)
GFR calc Af Amer: 55 mL/min — ABNORMAL LOW (ref >60)
GFR calc non Af Amer: 47 mL/min — ABNORMAL LOW (ref >60)
Glucose, Bld: 217 mg/dL — ABNORMAL HIGH (ref 65–99)
POTASSIUM: 4.5 mmol/L (ref 3.5–5.1)
SODIUM: 136 mmol/L (ref 135–145)

## 2017-11-13 LAB — GLUCOSE, CAPILLARY
GLUCOSE-CAPILLARY: 182 mg/dL — AB (ref 65–99)
GLUCOSE-CAPILLARY: 161 mg/dL — AB (ref 65–99)

## 2017-11-13 NOTE — Progress Notes (Signed)
   Subjective: 2 Days Post-Op Procedure(s) (LRB): RIGHT TOTAL KNEE ARTHROPLASTY (Right) Patient reports pain as mild and moderate.   Patient seen in rounds for Dr. Lequita HaltAluisio. Patient is well, but has had some minor complaints of pain in the knee, requiring pain medications Patient is ready to go home  Objective: Vital signs in last 24 hours: Temp:  [97.6 F (36.4 C)-98.1 F (36.7 C)] 98.1 F (36.7 C) (12/05 0503) Pulse Rate:  [57-79] 71 (12/05 0503) Resp:  [15-17] 17 (12/05 0503) BP: (126-138)/(54-62) 137/60 (12/05 0503) SpO2:  [92 %] 92 % (12/05 0503)  Intake/Output from previous day:  Intake/Output Summary (Last 24 hours) at 11/13/2017 0750 Last data filed at 11/13/2017 0718 Gross per 24 hour  Intake 1740 ml  Output 2800 ml  Net -1060 ml    Intake/Output this shift: Total I/O In: -  Out: 500 [Urine:500]  Labs: Recent Labs    11/12/17 0512 11/13/17 0542  HGB 12.4* 11.5*   Recent Labs    11/12/17 0512 11/13/17 0542  WBC 18.4* 18.5*  RBC 4.02* 3.73*  HCT 37.5* 35.0*  PLT 214 182   Recent Labs    11/12/17 0512 11/13/17 0542  NA 134* 136  K 4.2 4.5  CL 103 105  CO2 23 24  BUN 18 27*  CREATININE 1.12 1.37*  GLUCOSE 241* 217*  CALCIUM 8.7* 9.2   No results for input(s): LABPT, INR in the last 72 hours.  EXAM: General - Patient is Alert and Appropriate Extremity - Neurovascular intact Sensation intact distally Intact pulses distally Dorsiflexion/Plantar flexion intact Incision - clean, dry Motor Function - intact, moving foot and toes well on exam.   Assessment/Plan: 2 Days Post-Op Procedure(s) (LRB): RIGHT TOTAL KNEE ARTHROPLASTY (Right) Procedure(s) (LRB): RIGHT TOTAL KNEE ARTHROPLASTY (Right) Past Medical History:  Diagnosis Date  . Anemia    hx.  . Arthritis   . Diabetes mellitus without complication (HCC)    pt.states that he does not ever check blood sugars at home  . Headache   . High cholesterol   . History of kidney stones   .  Hypertension   . Pneumonia    years ago   Principal Problem:   OA (osteoarthritis) of knee  Estimated body mass index is 26.85 kg/m as calculated from the following:   Height as of this encounter: 6' (1.829 m).   Weight as of this encounter: 89.8 kg (198 lb). Up with therapy Diet - Cardiac diet and Diabetic diet Follow up - in 2 weeks Activity - WBAT Disposition - Home Condition Upon Discharge - Good D/C Meds - See DC Summary DVT Prophylaxis - Xarelto  Avel Peacerew Fate Caster, PA-C Orthopaedic Surgery 11/13/2017, 7:50 AM

## 2017-11-13 NOTE — Progress Notes (Signed)
Discharge planning, spoke with patient and spouse at bedside. [facility]. Contacted [facility] for referral. Has RW and 3n1 at home. [phone]

## 2017-11-13 NOTE — Progress Notes (Signed)
Physical Therapy Treatment Patient Details Name: Joshua MannanLarry D [name] MRN: [id number] DOB: 01-01-1936 Today's Date: 11/13/2017    History of Present Illness Pt is a 81 year old male s/p R TKA    PT Comments    Pt ambulated a little in hallway and then practiced safe stair technique with spouse and daughter present.  Pt feels ready for d/c home today and had no further questions.  Provided with HEP handout.   Follow Up Recommendations  Home health PT     Equipment Recommendations  Rolling walker with 5" wheels    Recommendations for Other Services       Precautions / Restrictions Precautions Precautions: Fall;Knee Required Braces or Orthoses: Knee Immobilizer - Right Knee Immobilizer - Right: Discontinue once straight leg raise with < 10 degree lag Restrictions Other Position/Activity Restrictions: WBAT    Mobility  Bed Mobility               General bed mobility comments: up in recliner  Transfers Overall transfer level: Needs assistance Equipment used: Rolling walker (2 wheeled) Transfers: Sit to/from Stand Sit to Stand: Min guard         General transfer comment: verbal cues for UE and LE positioning, more difficulty but able to rise with cues for armrests use  Ambulation/Gait Ambulation/Gait assistance: Min guard Ambulation Distance (Feet): 120 Feet Assistive device: Rolling walker (2 wheeled) Gait Pattern/deviations: Step-to pattern;Decreased stance time - right;Antalgic     General Gait Details: verbal cues for sequence, RW positioning, posture   Stairs Stairs: Yes   Stair Management: Two rails;Step to pattern;Forwards Number of Stairs: 3 General stair comments: verbal cues for sequence and safety, spouse and daughter present and educated, aware to have KI in place for safety  Wheelchair Mobility    Modified Rankin (Stroke Patients Only)       Balance                                            Cognition  Arousal/Alertness: Awake/alert Behavior During Therapy: WFL for tasks assessed/performed Overall Cognitive Status: Within Functional Limits for tasks assessed                                        Exercises      General Comments        Pertinent Vitals/Pain Pain Assessment: 0-10 Pain Score: 3  Pain Location: right knee with mobility Pain Descriptors / Indicators: Burning;Sore Pain Intervention(s): Limited activity within patient's tolerance;Repositioned;Monitored during session    Home Living                      Prior Function            PT Goals (current goals can now be found in the care plan section) Progress towards PT goals: Progressing toward goals    Frequency    7X/week      PT Plan Current plan remains appropriate    Co-evaluation              AM-PAC PT "6 Clicks" Daily Activity  Outcome Measure  Difficulty turning over in bed (including adjusting bedclothes, sheets and blankets)?: None Difficulty moving from lying on back to sitting on the side of the bed? : A Little Difficulty  sitting down on and standing up from a chair with arms (e.g., wheelchair, bedside commode, etc,.)?: A Little Help needed moving to and from a bed to chair (including a wheelchair)?: A Little Help needed walking in hospital room?: A Little Help needed climbing 3-5 steps with a railing? : A Little 6 Click Score: 19    End of Session Equipment Utilized During Treatment: Right knee immobilizer Activity Tolerance: Patient tolerated treatment well Patient left: with call bell/phone within reach;in chair;with family/visitor present Nurse Communication: Mobility status PT Visit Diagnosis: Other abnormalities of gait and mobility (R26.89)     Time: [id number] PT Time Calculation (min) (ACUTE ONLY): 14 min  Charges:  $Gait Training: 8-22 mins                    G Codes:      Zenovia JarredKati Jaelynn Currier, PT, DPT 11/13/2017 Pager: [phone]   Maida SaleLEMYRE,KATHrine  E 11/13/2017, 3:39 PM

## 2017-11-13 NOTE — Progress Notes (Signed)
Occupational Therapy Treatment Patient Details Name: Joshua [name] MRN: [id number] DOB: 08/21/1936 Today's Date: 11/13/2017    History of present illness Pt is a 81 year old male s/p R TKA   OT comments  Patient practiced getting dressed and toileting task. Performing at min A-min guard A level for these tasks. Wife to assist patient at home. Answered pt questions. Patient states he is being discharged home today.   Follow Up Recommendations  DC plan and follow up therapy as arranged by surgeon;Supervision/Assistance - 24 hour    Equipment Recommendations  3 in 1 bedside commode    Recommendations for Other Services      Precautions / Restrictions Precautions Precautions: Fall;Knee Required Braces or Orthoses: Knee Immobilizer - Right Knee Immobilizer - Right: Discontinue once straight leg raise with < 10 degree lag Restrictions Weight Bearing Restrictions: No Other Position/Activity Restrictions: WBAT       Mobility Bed Mobility               General bed mobility comments: NT -- OOB in recliner upon arrival  Transfers Overall transfer level: Needs assistance Equipment used: Rolling walker (2 wheeled) Transfers: Sit to/from Stand Sit to Stand: Min guard              Balance                                           ADL either performed or assessed with clinical judgement   ADL Overall ADL's : Needs assistance/impaired Eating/Feeding: Independent;Sitting               Upper Body Dressing : Set up;Sitting Upper Body Dressing Details (indicate cue type and reason): don t-shirt and button down shirt Lower Body Dressing: Minimal assistance;Sit to/from stand Lower Body Dressing Details (indicate cue type and reason): don jeans with belt Toilet Transfer: Min guard;Ambulation;BSC;RW   Toileting- ArchitectClothing Manipulation and Hygiene: Min guard;Sit to/from stand       Functional mobility during ADLs: Production assistant, radioMin guard;Rolling walker        Vision       Perception     Praxis      Cognition Arousal/Alertness: Awake/alert Behavior During Therapy: WFL for tasks assessed/performed Overall Cognitive Status: Within Functional Limits for tasks assessed                                          Exercises     Shoulder Instructions       General Comments      Pertinent Vitals/ Pain       Pain Assessment: 0-10 Pain Score: 5  Pain Location: right knee with mobility Pain Descriptors / Indicators: Burning;Sore Pain Intervention(s): Limited activity within patient's tolerance;Monitored during session;Repositioned;Ice applied  Home Living                                          Prior Functioning/Environment              Frequency  Min 2X/week        Progress Toward Goals  OT Goals(current goals can now be found in the care plan section)  Progress towards OT goals: Progressing toward goals  Acute Rehab OT Goals Patient Stated Goal: regain independence so I can do yardwork  Plan Discharge plan remains appropriate    Co-evaluation                 AM-PAC PT "6 Clicks" Daily Activity     Outcome Measure   Help from another person eating meals?: None Help from another person taking care of personal grooming?: A Little Help from another person toileting, which includes using toliet, bedpan, or urinal?: A Little Help from another person bathing (including washing, rinsing, drying)?: A Little Help from another person to put on and taking off regular upper body clothing?: None Help from another person to put on and taking off regular lower body clothing?: A Little 6 Click Score: 20    End of Session Equipment Utilized During Treatment: Rolling walker;Right knee immobilizer  OT Visit Diagnosis: Unsteadiness on feet (R26.81);Muscle weakness (generalized) (M62.81)   Activity Tolerance Patient tolerated treatment well   Patient Left in chair;with call  bell/phone within reach   Nurse Communication          Time: 4098-11911021-1035 OT Time Calculation (min): 14 min  Charges: OT General Charges $OT Visit: 1 Visit OT Treatments $Self Care/Home Management : 8-22 mins    Emmarie Sannes A Marte Celani 11/13/2017, 10:40 AM

## 2017-11-13 NOTE — Progress Notes (Signed)
Physical Therapy Treatment Patient Details Name: Joshua [name] MRN: [id number] DOB: 09/11/1936 Today's Date: 11/13/2017    History of Present Illness Pt is a 81 year old male s/p R TKA    PT Comments    Pt ambulated in hallway and performed LE exercises.  Plan to practice steps this afternoon with family present prior to d/c home.  Follow Up Recommendations  Home health PT     Equipment Recommendations  Rolling walker with 5" wheels    Recommendations for Other Services       Precautions / Restrictions Precautions Precautions: Fall;Knee Required Braces or Orthoses: Knee Immobilizer - Right Knee Immobilizer - Right: Discontinue once straight leg raise with < 10 degree lag Restrictions Weight Bearing Restrictions: No Other Position/Activity Restrictions: WBAT    Mobility  Bed Mobility Overal bed mobility: Needs Assistance Bed Mobility: Supine to Sit     Supine to sit: Min guard;HOB elevated     General bed mobility comments: min/guard for safety  Transfers Overall transfer level: Needs assistance Equipment used: Rolling walker (2 wheeled) Transfers: Sit to/from Stand Sit to Stand: Min guard         General transfer comment: verbal cues for UE and LE positioning, more difficulty today so elevated bed a little  Ambulation/Gait Ambulation/Gait assistance: Min guard Ambulation Distance (Feet): 180 Feet Assistive device: Rolling walker (2 wheeled) Gait Pattern/deviations: Step-to pattern;Decreased stance time - right;Antalgic     General Gait Details: verbal cues for sequence, RW positioning, posture   Stairs            Wheelchair Mobility    Modified Rankin (Stroke Patients Only)       Balance                                            Cognition Arousal/Alertness: Awake/alert Behavior During Therapy: WFL for tasks assessed/performed Overall Cognitive Status: Within Functional Limits for tasks assessed                                         Exercises Total Joint Exercises Ankle Circles/Pumps: AROM;Both;10 reps Quad Sets: AROM;Right;10 reps Short Arc Quad: Right;10 reps;AAROM Heel Slides: 10 reps;Right;AAROM Hip ABduction/ADduction: Right;10 reps;AAROM Straight Leg Raises: AAROM;Right;10 reps    General Comments        Pertinent Vitals/Pain Pain Assessment: 0-10 Pain Score: 5  Pain Location: right knee with mobility Pain Descriptors / Indicators: Burning;Sore Pain Intervention(s): Limited activity within patient's tolerance;Repositioned;Ice applied;Monitored during session    Home Living                      Prior Function            PT Goals (current goals can now be found in the care plan section) Acute Rehab PT Goals Patient Stated Goal: regain independence so I can do yardwork Progress towards PT goals: Progressing toward goals    Frequency    7X/week      PT Plan Current plan remains appropriate    Co-evaluation              AM-PAC PT "6 Clicks" Daily Activity  Outcome Measure  Difficulty turning over in bed (including adjusting bedclothes, sheets and blankets)?: None Difficulty moving from lying on back  to sitting on the side of the bed? : A Little Difficulty sitting down on and standing up from a chair with arms ([name].g., wheelchair, bedside commode, etc,.)?: A Little Help needed moving to and from a bed to chair (including a wheelchair)?: A Little Help needed walking in hospital room?: A Little Help needed climbing 3-5 steps with a railing? : A Little 6 Click Score: 19    End of Session Equipment Utilized During Treatment: Right knee immobilizer;Gait belt Activity Tolerance: Patient tolerated treatment well Patient left: with call bell/phone within reach;in chair   PT Visit Diagnosis: Other abnormalities of gait and mobility (R26.89)     Time: [id number] PT Time Calculation (min) (ACUTE ONLY): 19 min  Charges:  $Therapeutic  Exercise: 8-22 mins                    G Codes:       Joshua [name], PT, DPT 11/13/2017 Pager: [phone]  Joshua [name],Joshua [name] 11/13/2017, 11:37 AM

## 2018-01-06 NOTE — Progress Notes (Signed)
[facility] - Clinic Note  01/07/2018     CHIEF COMPLAINT Patient presents for Retina Follow Up   HISTORY OF PRESENT ILLNESS: Joshua [name] is a 82 y.o. male who presents to the clinic today for:   HPI    Retina Follow Up    Patient presents with  Other.  In both eyes.  Severity is mild.  Duration of 2 months.  Since onset it is stable.  I, the attending physician,  performed the HPI with the patient and updated documentation appropriately.          Comments    F/U lattice degen; S/P laser retinopexy OD (11.27.18), OS (11.13.18); Pt states OU VA is stable; Pt reports OU are comfortable; Pt denies floaters, denies flashes, denies wavy VA, denies ocular pain;        Last edited by Rennis Chris, MD on 01/07/2018  1:41 PM. (History)    Pt had knee surgery on 12.3.18. Recovering well.  Referring physician: Kaleen Mask, MD [address] [place], [place] [address]  HISTORICAL INFORMATION:   Selected notes from the MEDICAL RECORD NUMBER Referral from Dr. Eugene Garnet for concern of ARMD OU;  LEE- 11.6.18 (Dr. Eugene Garnet) Ocular Hx- pseudophakia OU (Surgeon: Eps), DES OU PMH- elevated chol., DM, HTN   CURRENT MEDICATIONS: Current Outpatient Medications (Ophthalmic Drugs)  Medication Sig  . prednisoLONE acetate (PRED FORTE) 1 % ophthalmic suspension Place 1 drop 4 (four) times daily into the left eye. (Patient taking differently: Place 1 drop into the right eye 4 (four) times daily. )   No current facility-administered medications for this visit.  (Ophthalmic Drugs)   Current Outpatient Medications (Other)  Medication Sig  . allopurinol (ZYLOPRIM) 300 MG tablet Take 300 mg by mouth 2 (two) times daily.  Marland Kitchen amLODipine (NORVASC) 10 MG tablet Take 10 mg by mouth daily.  . fluticasone (FLONASE) 50 MCG/ACT nasal spray Place 2 sprays into both nostrils at bedtime.  Marland Kitchen lisinopril (PRINIVIL,ZESTRIL) 40 MG tablet Take 40 mg by mouth daily.  Marland Kitchen  lovastatin (MEVACOR) 20 MG tablet Take 20 mg by mouth at bedtime.  . metFORMIN (GLUCOPHAGE) 500 MG tablet Take 1,000 mg by mouth 2 (two) times daily.   . methocarbamol (ROBAXIN) 500 MG tablet Take 1 tablet (500 mg total) by mouth every 6 (six) hours as needed for muscle spasms.  . metoprolol (LOPRESSOR) 100 MG tablet Take 100 mg by mouth daily.  Marland Kitchen oxyCODONE (OXY IR/ROXICODONE) 5 MG immediate release tablet Take 1-2 tablets (5-10 mg total) by mouth every 4 (four) hours as needed for moderate pain or severe pain.  . rivaroxaban (XARELTO) 10 MG TABS tablet Take 1 tablet (10 mg total) by mouth daily with breakfast. Take Xarelto for two and a half more weeks following discharge from the hospital, then discontinue Xarelto. Once the patient has completed the blood thinner regimen, then take a Baby 81 mg Aspirin daily for three more weeks.  . traMADol (ULTRAM) 50 MG tablet Take 1-2 tablets (50-100 mg total) by mouth every 8 (eight) hours as needed for moderate pain.   No current facility-administered medications for this visit.  (Other)      REVIEW OF SYSTEMS: ROS    Negative for: Constitutional, Gastrointestinal, Neurological, Skin, Genitourinary, Musculoskeletal, HENT, Endocrine, Cardiovascular, Eyes, Respiratory, Psychiatric, Allergic/Imm, Heme/Lymph   Last edited by Concepcion Elk on 01/07/2018  1:28 PM. (History)       ALLERGIES No Known Allergies  PAST MEDICAL HISTORY Past  Medical History:  Diagnosis Date  . Anemia    hx.  . Arthritis   . Diabetes mellitus without complication (HCC)    pt.states that he does not ever check blood sugars at home  . Headache   . High cholesterol   . History of kidney stones   . Hypertension   . Pneumonia    years ago   Past Surgical History:  Procedure Laterality Date  . ANKLE FRACTURE SURGERY Left    when 49-67- years old  . ANTERIOR CERVICAL DECOMP/DISCECTOMY FUSION N/A 07/11/2017   Procedure: Anterior Cervical Decompression/Discectomy  Fusion  - Cervical three-four;  Surgeon: Donalee Citrin, MD;  Location: [facility];  Service: Neurosurgery;  Laterality: N/A; limited extension of neck currently   . CATARACT EXTRACTION Bilateral   . FRACTURE SURGERY      fx. lower left leg at age 79-13  . TOTAL KNEE ARTHROPLASTY Right 11/11/2017   Procedure: RIGHT TOTAL KNEE ARTHROPLASTY;  Surgeon: Ollen Gross, MD;  Location: [facility] ORS;  Service: Orthopedics;  Laterality: Right;    FAMILY HISTORY Family History  Problem Relation Age of Onset  . Strabismus Daughter     SOCIAL HISTORY Social History   Tobacco Use  . Smoking status: Former Smoker    Packs/day: 2.00    Years: 60.00    Pack years: 120.00    Types: Cigarettes    Last attempt to quit: 07/10/2016    Years since quitting: 1.4  . Smokeless tobacco: Never Used  Substance Use Topics  . Alcohol use: No  . Drug use: No         OPHTHALMIC EXAM:  Base Eye Exam    Visual Acuity (Snellen - Linear)      Right Left   Dist [place] 20/25 20/30   Dist ph [place] 20/25 20/25       Tonometry (Tonopen, 1:35 PM)      Right Left   Pressure 15 16       Pupils      Dark Light Shape React APD   Right 4 3 Round Brisk None   Left 4 3 Round Brisk None       Visual Fields (Counting fingers)      Left Right    Full Full       Extraocular Movement      Right Left    Full, Ortho Full, Ortho       Neuro/Psych    Oriented x3:  Yes   Mood/Affect:  Normal       Dilation    Both eyes:  1.0% Mydriacyl, 2.5% Phenylephrine @ 1:35 PM        Slit Lamp and Fundus Exam    Slit Lamp Exam      Right Left   Lids/Lashes Dermatochalasis - upper lid Dermatochalasis - upper lid   Conjunctiva/Sclera White and quiet White and quiet   Cornea subepithelial scarring para central, Arcus Round subepithelial scarring nasal para central, Arcus   Anterior Chamber Deep and quiet Deep and quiet   Iris Round and dilated Round and dilated   Lens Three piece Posterior chamber intraocular lens in good position  Three piece Posterior chamber intraocular lens in good position with open PC   Vitreous Vitreous syneresis, Posterior vitreous detachment Vitreous syneresis, Posterior vitreous detachment       Fundus Exam      Right Left   Disc Normal Normal   C/D Ratio 0.5 0.5   Macula Flat, Retinal pigment epithelial  mottling, mild Drusen, no heme or edema Flat, Drusen, no heme or edema, Retinal pigment epithelial mottling   Vessels Normal Normal   Periphery attached, Superior pigmented lattice degeneration with excellent laser surrounding  attached, Superior pigmented lattice degeneration with small atrophic holes - excellent laser surrounding          IMAGING AND PROCEDURES  Imaging and Procedures for 01/07/18  OCT, Retina - OU - Both Eyes     Right Eye Quality was good. Central Foveal Thickness: 258. Progression has been stable. Findings include normal foveal contour, no IRF, no SRF, retinal drusen .   Left Eye Quality was good. Central Foveal Thickness: 250. Progression has been stable. Findings include normal foveal contour, no IRF, no SRF, retinal drusen .   Notes Images taken, stored on drive  Diagnosis / Impression:  Mild Non-exudative ARMD OU  Clinical management:  See below  Abbreviations: NFP - Normal foveal profile. CME - cystoid macular edema. PED - pigment epithelial detachment. IRF - intraretinal fluid. SRF - subretinal fluid. EZ - ellipsoid zone. ERM - epiretinal membrane. ORA - outer retinal atrophy. ORT - outer retinal tubulation. 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 Diabetes mellitus type 2 without retinopathy (HCC) E11.9   4. Pseudophakia Z96.1   5. Retinal edema H35.81 OCT, Retina - OU - Both Eyes    1. Lattice degeneration OU-   - pigmented lattice  superiorly with atrophic holes  - s/p laser retinopexy OS (11.13.18), s/p laser retinopexy OD (11.27.18)  - excellent laser in place  - no new RT/RD or SRF  - f/u in 3-4 months  2. Age related macular degeneration, non-exudative, both eyes -- early  - The incidence, anatomy, and pathology of dry AMD, risk of progression, and the AREDS and AREDS 2 study including smoking risks discussed with patient.  - Recommend amsler grid monitoring  - will continue to monitor  - f/u in 3-4 mos  3. DM2 w/o retinopathy The incidence, risk factors for progression, natural history and treatment options for diabetic retinopathy  were discussed with patient.  The need for close monitoring of blood glucose, blood pressure, and serum lipids, avoiding cigarette or any type of tobacco, and the need for long term follow up was also discussed with patient.  4. Pseudophakia OU  - s/p CE/IOL OU  - beautiful surgery, doing well  - monitor   Ophthalmic Meds Ordered this visit:  No orders of the defined types were placed in this encounter.      Return in about 3 months (around 04/07/2018) for 3-4 mos, Dilated Exam, OCT.  There are no Patient Instructions on file for this visit.   Explained the diagnoses, plan, and follow up with the patient and they expressed understanding.  Patient expressed understanding of the importance of proper follow up care.   This document serves as a record of services personally performed by Karie ChimeraBrian G. Nyilah Kight, MD, PhD. It was created on their behalf by Virgilio BellingMeredith Fabian, COA, a certified ophthalmic assistant. The creation of this record is the provider's dictation and/or activities during the visit.  Electronically signed by: Virgilio BellingMeredith Fabian, COA  01/07/18 11:12 PM    Karie ChimeraBrian G. Chalsey Leeth, M.D., Ph.D. Diseases & Surgery of the  Retina and Vitreous [facility] 01/07/18  I have reviewed the above documentation for accuracy and completeness, and I agree with the  above. Karie Chimera, M.D., Ph.D. 01/07/18 11:12 PM     Abbreviations: M myopia (nearsighted); A astigmatism; H hyperopia (farsighted); P presbyopia; Mrx spectacle prescription;  CTL contact lenses; OD right eye; OS left eye; OU both eyes  XT exotropia; ET esotropia; PEK punctate epithelial keratitis; PEE punctate epithelial erosions; DES dry eye syndrome; MGD meibomian gland dysfunction; ATs artificial tears; PFAT's preservative free artificial tears; NSC nuclear sclerotic cataract; PSC posterior subcapsular cataract; ERM epi-retinal membrane; PVD posterior vitreous detachment; RD retinal detachment; DM diabetes mellitus; DR diabetic retinopathy; NPDR non-proliferative diabetic retinopathy; PDR proliferative diabetic retinopathy; CSME clinically significant macular edema; DME diabetic macular edema; dbh dot blot hemorrhages; CWS cotton wool spot; POAG primary open angle glaucoma; C/D cup-to-disc ratio; HVF humphrey visual field; GVF goldmann visual field; OCT optical coherence tomography; IOP intraocular pressure; BRVO Branch retinal vein occlusion; CRVO central retinal vein occlusion; CRAO central retinal artery occlusion; BRAO branch retinal artery occlusion; RT retinal tear; SB scleral buckle; PPV pars plana vitrectomy; VH Vitreous hemorrhage; PRP panretinal laser photocoagulation; IVK intravitreal kenalog; VMT vitreomacular traction; MH Macular hole;  NVD neovascularization of the disc; NVE neovascularization elsewhere; AREDS age related eye disease study; ARMD age related macular degeneration; POAG primary open angle glaucoma; EBMD epithelial/anterior basement membrane dystrophy; ACIOL anterior chamber intraocular lens; IOL intraocular lens; PCIOL posterior chamber intraocular lens; Phaco/IOL phacoemulsification with intraocular lens placement; PRK photorefractive keratectomy; LASIK laser assisted in situ keratomileusis; HTN hypertension; DM diabetes mellitus; COPD chronic obstructive pulmonary  disease

## 2018-01-07 ENCOUNTER — Encounter (INDEPENDENT_AMBULATORY_CARE_PROVIDER_SITE_OTHER): Payer: Self-pay | Admitting: Ophthalmology

## 2018-01-07 ENCOUNTER — Ambulatory Visit (INDEPENDENT_AMBULATORY_CARE_PROVIDER_SITE_OTHER): Payer: Medicare Other | Admitting: Ophthalmology

## 2018-01-07 DIAGNOSIS — H353131 Nonexudative age-related macular degeneration, bilateral, early dry stage: Secondary | ICD-10-CM

## 2018-01-07 DIAGNOSIS — H3581 Retinal edema: Secondary | ICD-10-CM | POA: Diagnosis not present

## 2018-01-07 DIAGNOSIS — H35413 Lattice degeneration of retina, bilateral: Secondary | ICD-10-CM

## 2018-01-07 DIAGNOSIS — Z961 Presence of intraocular lens: Secondary | ICD-10-CM

## 2018-01-07 DIAGNOSIS — E119 Type 2 diabetes mellitus without complications: Secondary | ICD-10-CM

## 2018-02-13 ENCOUNTER — Ambulatory Visit: Payer: Self-pay | Admitting: Orthopedic Surgery

## 2018-04-07 ENCOUNTER — Inpatient Hospital Stay: Admit: 2018-04-07 | Payer: Medicare Other | Admitting: Orthopedic Surgery

## 2018-04-07 SURGERY — ARTHROPLASTY, KNEE, TOTAL
Anesthesia: Choice | Site: Knee | Laterality: Left

## 2018-05-06 NOTE — Progress Notes (Addendum)
[facility] - Clinic Note  05/07/2018     CHIEF COMPLAINT Patient presents for Retina Follow Up   HISTORY OF PRESENT ILLNESS: Joshua [name] is a 82 y.o. male who presents to the clinic today for:   HPI    Retina Follow Up    In both eyes.  This started 7 months ago.  Severity is mild.  I, the attending physician,  performed the HPI with the patient and updated documentation appropriately.          Comments    F/U Lattice degen. S/P laser retinopexy OD 11/05/17 and OS 10/22/17. Patient states his is getting better.Denies flashes floaters and ocular pain.       Last edited by Rennis Chris, MD on 05/07/2018  2:29 PM. (History)    Pt had knee surgery on 12.3.18. Recovering well.  Referring physician: Kaleen Mask, MD [address] [place], [place] [address]  HISTORICAL INFORMATION:   Selected notes from the MEDICAL RECORD NUMBER Referral from Dr. Eugene Garnet for concern of ARMD OU;  LEE- 11.6.18 (Dr. Eugene Garnet) Ocular Hx- pseudophakia OU (Surgeon: Eps), DES OU PMH- elevated chol., DM, HTN   CURRENT MEDICATIONS: Current Outpatient Medications (Ophthalmic Drugs)  Medication Sig  . prednisoLONE acetate (PRED FORTE) 1 % ophthalmic suspension Place 1 drop 4 (four) times daily into the left eye. (Patient not taking: Reported on 05/07/2018)   No current facility-administered medications for this visit.  (Ophthalmic Drugs)   Current Outpatient Medications (Other)  Medication Sig  . allopurinol (ZYLOPRIM) 300 MG tablet Take 300 mg by mouth 2 (two) times daily.  Marland Kitchen amLODipine (NORVASC) 10 MG tablet Take 10 mg by mouth daily.  . fluticasone (FLONASE) 50 MCG/ACT nasal spray Place 2 sprays into both nostrils at bedtime.  Marland Kitchen lisinopril (PRINIVIL,ZESTRIL) 40 MG tablet Take 40 mg by mouth daily.  Marland Kitchen lovastatin (MEVACOR) 20 MG tablet Take 20 mg by mouth at bedtime.  . metFORMIN (GLUCOPHAGE) 500 MG tablet Take 1,000 mg by mouth 2 (two) times daily.   .  metoprolol (LOPRESSOR) 100 MG tablet Take 100 mg by mouth daily.  Marland Kitchen oxyCODONE (OXY IR/ROXICODONE) 5 MG immediate release tablet Take 1-2 tablets (5-10 mg total) by mouth every 4 (four) hours as needed for moderate pain or severe pain.  . rivaroxaban (XARELTO) 10 MG TABS tablet Take 1 tablet (10 mg total) by mouth daily with breakfast. Take Xarelto for two and a half more weeks following discharge from the hospital, then discontinue Xarelto. Once the patient has completed the blood thinner regimen, then take a Baby 81 mg Aspirin daily for three more weeks.  . traMADol (ULTRAM) 50 MG tablet Take 1-2 tablets (50-100 mg total) by mouth every 8 (eight) hours as needed for moderate pain.  . methocarbamol (ROBAXIN) 500 MG tablet Take 1 tablet (500 mg total) by mouth every 6 (six) hours as needed for muscle spasms. (Patient not taking: Reported on 05/07/2018)   No current facility-administered medications for this visit.  (Other)      REVIEW OF SYSTEMS: ROS    Positive for: Eyes   Negative for: Constitutional, Gastrointestinal, Neurological, Skin, Genitourinary, Musculoskeletal, HENT, Endocrine, Cardiovascular, Respiratory, Psychiatric, Allergic/Imm, Heme/Lymph   Last edited by Eldridge Scot, LPN on 05/13/5408  1:36 PM. (History)       ALLERGIES No Known Allergies  PAST MEDICAL HISTORY Past Medical History:  Diagnosis Date  . Anemia    hx.  . Arthritis   . Diabetes mellitus without  complication (HCC)    pt.states that he does not ever check blood sugars at home  . Headache   . High cholesterol   . History of kidney stones   . Hypertension   . Pneumonia    years ago   Past Surgical History:  Procedure Laterality Date  . ANKLE FRACTURE SURGERY Left    when [age]- years old  . ANTERIOR CERVICAL DECOMP/DISCECTOMY FUSION N/A 07/11/2017   Procedure: Anterior Cervical Decompression/Discectomy Fusion  - Cervical three-four;  Surgeon: Donalee Citrin, MD;  Location: [facility] OR;  Service:  Neurosurgery;  Laterality: N/A; limited extension of neck currently   . CATARACT EXTRACTION Bilateral   . FRACTURE SURGERY      fx. lower left leg at age 73-13  . TOTAL KNEE ARTHROPLASTY Right 11/11/2017   Procedure: RIGHT TOTAL KNEE ARTHROPLASTY;  Surgeon: Ollen Gross, MD;  Location: [facility] ORS;  Service: Orthopedics;  Laterality: Right;    FAMILY HISTORY Family History  Problem Relation Age of Onset  . Strabismus Daughter     SOCIAL HISTORY Social History   Tobacco Use  . Smoking status: Former Smoker    Packs/day: 2.00    Years: 60.00    Pack years: 120.00    Types: Cigarettes    Last attempt to quit: 07/10/2016    Years since quitting: 1.8  . Smokeless tobacco: Never Used  Substance Use Topics  . Alcohol use: No  . Drug use: No         OPHTHALMIC EXAM:  Base Eye Exam    Visual Acuity (Snellen - Linear)      Right Left   Dist [place] 20/30 20/30   Dist ph [place] 20/25 20/25       Tonometry (Tonopen, 1:42 PM)      Right Left   Pressure 14 15       Pupils      Dark Light Shape React APD   Right 4 3 Round Brisk None   Left 4 3 Round Brisk None       Visual Fields (Counting fingers)      Left Right    Full Full       Extraocular Movement      Right Left    Full, Ortho Full, Ortho       Neuro/Psych    Oriented x3:  Yes   Mood/Affect:  Normal       Dilation    Both eyes:  1.0% Mydriacyl, Paremyd @ 1:43 PM        Slit Lamp and Fundus Exam    Slit Lamp Exam      Right Left   Lids/Lashes Dermatochalasis - upper lid Dermatochalasis - upper lid   Conjunctiva/Sclera White and quiet White and quiet   Cornea subepithelial scarring para central, Arcus Round subepithelial scarring nasal para central, Arcus   Anterior Chamber Deep and quiet Deep and quiet   Iris Round and dilated Round and dilated   Lens Three piece Posterior chamber intraocular lens in good position Three piece Posterior chamber intraocular lens in good position with open PC   Vitreous  Vitreous syneresis, Posterior vitreous detachment Vitreous syneresis, Posterior vitreous detachment       Fundus Exam      Right Left   Disc Normal Normal   C/D Ratio 0.5 0.5   Macula Flat, Retinal pigment epithelial mottling, mild Drusen, no heme or edema Flat, Drusen, no heme or edema, Retinal pigment epithelial mottling   Vessels Normal Normal  Periphery attached, Superior pigmented lattice degeneration with excellent laser surrounding  attached, Superior pigmented lattice degeneration with small atrophic holes - excellent laser surrounding          IMAGING AND PROCEDURES  Imaging and Procedures for 01/07/18  OCT, Retina - OU - Both Eyes       Right Eye Quality was good. Central Foveal Thickness: 259. Progression has been stable. Findings include normal foveal contour, no IRF, no SRF, retinal drusen .   Left Eye Quality was good. Central Foveal Thickness: 253. Progression has been stable. Findings include normal foveal contour, no IRF, no SRF, retinal drusen .   Notes Images taken, stored on drive  Diagnosis / Impression:  Mild Non-exudative ARMD OU  Clinical management:  See below  Abbreviations: NFP - Normal foveal profile. CME - cystoid macular edema. PED - pigment epithelial detachment. IRF - intraretinal fluid. SRF - subretinal fluid. EZ - ellipsoid zone. ERM - epiretinal membrane. ORA - outer retinal atrophy. ORT - outer retinal tubulation. 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 Diabetes mellitus type 2 without retinopathy (HCC) E11.9   4. Pseudophakia Z96.1   5. Retinal edema H35.81 OCT, Retina - OU - Both Eyes    1. Lattice degeneration OU-   - pigmented lattice superiorly with atrophic holes  - s/p laser retinopexy OS (11.13.18), s/p laser retinopexy  OD (11.27.18)  - excellent laser in place  - no new RT/RD or SRF  2. Age related macular degeneration, non-exudative, both eyes -- early  - The incidence, anatomy, and pathology of dry AMD, risk of progression, and the AREDS and AREDS 2 study including smoking risks discussed with patient.  - Recommend amsler grid monitoring  - will continue to monitor  - f/u in 6-9 mos  3. DM2 w/o retinopathy The incidence, risk factors for progression, natural history and treatment options for diabetic retinopathy  were discussed with patient.  The need for close monitoring of blood glucose, blood pressure, and serum lipids, avoiding cigarette or any type of tobacco, and the need for long term follow up was also discussed with patient. - continue to monitor  4. Pseudophakia OU  - s/p CE/IOL OU  - beautiful surgery, doing well  - monitor   Ophthalmic Meds Ordered this visit:  No orders of the defined types were placed in this encounter.      Return for 6-9 mos f/u.  There are no Patient Instructions on file for this visit.   Explained the diagnoses, plan, and follow up with the patient and they expressed understanding.  Patient expressed understanding of the importance of proper follow up care.   This document serves as a record of services personally performed by Karie Chimera, MD, PhD. It was created on their behalf by Virgilio Belling, COA, a certified ophthalmic assistant. The creation of this record is the provider's dictation and/or activities during the visit.  Electronically signed by: Virgilio Belling, COA  05.28.19 12:28 PM     Karie Chimera, M.D., Ph.D. Diseases & Surgery of the Retina and Vitreous [facility] 05/07/18  I have reviewed the above documentation for accuracy and completeness, and I agree with the above. Karie Chimera, M.D.,  Ph.D. 05/08/18 12:28 PM    Abbreviations: M myopia (nearsighted); A astigmatism; H hyperopia (farsighted); P  presbyopia; Mrx spectacle prescription;  CTL contact lenses; OD right eye; OS left eye; OU both eyes  XT exotropia; ET esotropia; PEK punctate epithelial keratitis; PEE punctate epithelial erosions; DES dry eye syndrome; MGD meibomian gland dysfunction; ATs artificial tears; PFAT's preservative free artificial tears; NSC nuclear sclerotic cataract; PSC posterior subcapsular cataract; ERM epi-retinal membrane; PVD posterior vitreous detachment; RD retinal detachment; DM diabetes mellitus; DR diabetic retinopathy; NPDR non-proliferative diabetic retinopathy; PDR proliferative diabetic retinopathy; CSME clinically significant macular edema; DME diabetic macular edema; dbh dot blot hemorrhages; CWS cotton wool spot; POAG primary open angle glaucoma; C/D cup-to-disc ratio; HVF humphrey visual field; GVF goldmann visual field; OCT optical coherence tomography; IOP intraocular pressure; BRVO Branch retinal vein occlusion; CRVO central retinal vein occlusion; CRAO central retinal artery occlusion; BRAO branch retinal artery occlusion; RT retinal tear; SB scleral buckle; PPV pars plana vitrectomy; VH Vitreous hemorrhage; PRP panretinal laser photocoagulation; IVK intravitreal kenalog; VMT vitreomacular traction; MH Macular hole;  NVD neovascularization of the disc; NVE neovascularization elsewhere; AREDS age related eye disease study; ARMD age related macular degeneration; POAG primary open angle glaucoma; EBMD epithelial/anterior basement membrane dystrophy; ACIOL anterior chamber intraocular lens; IOL intraocular lens; PCIOL posterior chamber intraocular lens; Phaco/IOL phacoemulsification with intraocular lens placement; PRK photorefractive keratectomy; LASIK laser assisted in situ keratomileusis; HTN hypertension; DM diabetes mellitus; COPD chronic obstructive pulmonary disease

## 2018-05-07 ENCOUNTER — Ambulatory Visit (INDEPENDENT_AMBULATORY_CARE_PROVIDER_SITE_OTHER): Payer: Medicare Other | Admitting: Ophthalmology

## 2018-05-07 ENCOUNTER — Encounter (INDEPENDENT_AMBULATORY_CARE_PROVIDER_SITE_OTHER): Payer: Self-pay | Admitting: Ophthalmology

## 2018-05-07 DIAGNOSIS — E119 Type 2 diabetes mellitus without complications: Secondary | ICD-10-CM | POA: Diagnosis not present

## 2018-05-07 DIAGNOSIS — Z961 Presence of intraocular lens: Secondary | ICD-10-CM

## 2018-05-07 DIAGNOSIS — H353131 Nonexudative age-related macular degeneration, bilateral, early dry stage: Secondary | ICD-10-CM

## 2018-05-07 DIAGNOSIS — H3581 Retinal edema: Secondary | ICD-10-CM

## 2018-05-07 DIAGNOSIS — H35413 Lattice degeneration of retina, bilateral: Secondary | ICD-10-CM

## 2018-05-08 ENCOUNTER — Encounter (INDEPENDENT_AMBULATORY_CARE_PROVIDER_SITE_OTHER): Payer: Self-pay | Admitting: Ophthalmology

## 2018-07-25 ENCOUNTER — Other Ambulatory Visit: Payer: Self-pay | Admitting: Nurse Practitioner

## 2018-07-25 ENCOUNTER — Ambulatory Visit
Admission: RE | Admit: 2018-07-25 | Discharge: 2018-07-25 | Disposition: A | Discharge status: Home / Self Care | Payer: Medicare Other | Source: Ambulatory Visit | Attending: Nurse Practitioner | Admitting: Nurse Practitioner

## 2018-07-25 DIAGNOSIS — Z01818 Encounter for other preprocedural examination: Secondary | ICD-10-CM

## 2018-08-01 ENCOUNTER — Ambulatory Visit: Payer: Medicare Other | Admitting: Emergency Medicine

## 2018-08-01 ENCOUNTER — Encounter: Payer: Self-pay | Admitting: *Deleted

## 2018-08-01 ENCOUNTER — Encounter: Payer: Self-pay | Admitting: Emergency Medicine

## 2018-08-01 VITALS — BP 168/70 | HR 56 | Ht 71.25 in | Wt 208.0 lb

## 2018-08-01 DIAGNOSIS — R9389 Abnormal findings on diagnostic imaging of other specified body structures: Secondary | ICD-10-CM | POA: Diagnosis not present

## 2018-08-01 DIAGNOSIS — J449 Chronic obstructive pulmonary disease, unspecified: Secondary | ICD-10-CM | POA: Insufficient documentation

## 2018-08-01 MED ORDER — ALBUTEROL SULFATE HFA 108 (90 BASE) MCG/ACT IN AERS: 2.0000 | INHALATION_SPRAY | RESPIRATORY_TRACT | 5 refills | Status: DC | PRN

## 2018-08-01 NOTE — Patient Instructions (Addendum)
Walking oximetry on room air today shows that you do not need oxygen while exerting Spirometry was performed today.  We will likely perform more complete pulmonary function testing in the future are We will teach you to use albuterol inhaler. Use 2 puffs up to every 4 hours if needed for shortness of breath, wheezing.  You may benefit from being on an every-day inhaler at some point. We will decide this based on your breathing testing and your function as we go forward Your CXR shows some slight scarring at the bottom of the lungs. We will follow this, probably with further CXR's or by CT scan of the chest.  Based on your evaluation today you are at low to moderate risk for surgery due to some underlying lung disease.  This does not mean you cannot have the operation as long as all appropriate precautions are made.  We will forward this information to Dr. Lequita HaltAluisio with orthopedic surgery. You should get the flu shot this fall. Follow with Dr Delton CoombesByrum in 6 months or sooner if you have any problems

## 2018-08-01 NOTE — Assessment & Plan Note (Signed)
As evidenced by his clinical symptoms, history of tobacco, mild to moderate mixed disease by his spirometry today.  He may ultimately benefit from a schedule bronchodilator.  I like to get full PFT first.  For now I will teach him how to use albuterol as needed, see if he benefits from this.  Nothing here to preclude him from proceeding with his orthopedic surgery as long as he does not develop any signs of an acute exacerbation.  He falls into the mild to moderate risk classification.  We will forward this information to Dr. Despina HickAlusio.   Walking oximetry on room air today shows that you do not need oxygen while exerting Spirometry was performed today.  We will likely perform more complete pulmonary function testing in the future are We will teach you to use albuterol inhaler. Use 2 puffs up to every 4 hours if needed for shortness of breath, wheezing.  You may benefit from being on an every-day inhaler at some point. We will decide this based on your breathing testing and your function as we go forward You should get the flu shot this fall. Follow with Dr Delton CoombesByrum in 6 months or sooner if you have any problems

## 2018-08-01 NOTE — Progress Notes (Signed)
Subjective:    Patient ID: Joshua [name], male    DOB: [birth year], 82 y.o.   MRN: [id number]  HPI 82 year old man, former smoker (80 pack years) with a history of diabetes, hypertension, hyperlipidemia, osteoarthritis particularly in his knees.  He is referred today for preoperative evaluation evaluation by Dr. Jeannetta Nap in preparation for a L knee surgery. He has had his R knee replaced last year successfully without any respiratory complications. He had a CXR 07/25/18 that I reviewed.   Has some daily cough, minimally bothersome, mostly in the am and minimally productive. Some limitation in his functional capacity. Has to sit when working outside for an extended time, mainly due to SOB.  His wife notes that he snores, witnessed apneas.  Notes that he had PNA's in the past, none recently.  He has some daily GERD sx   Spirometry today shows some restriction with probable superimposed obstruction, moderate reduction in FEV1 (74% predicted) Ambulatory oximetry he was able to walk 2 laps, limited by his knee discomfort, he did not desaturate on room air.  Review of Systems  Constitutional: Positive for unexpected weight change. Negative for fever.  HENT: Positive for congestion, dental problem and ear pain. Negative for nosebleeds, postnasal drip, rhinorrhea, sinus pressure, sneezing, sore throat and trouble swallowing.   Eyes: Negative for redness and itching.  Respiratory: Positive for cough and shortness of breath. Negative for chest tightness and wheezing.   Cardiovascular: Negative for palpitations and leg swelling.  Gastrointestinal: Negative for nausea and vomiting.  Genitourinary: Negative for dysuria.  Musculoskeletal: Negative for joint swelling.  Skin: Negative for rash.  Neurological: Positive for headaches.  Hematological: Does not bruise/bleed easily.  Psychiatric/Behavioral: Positive for dysphoric mood. The patient is nervous/anxious.    Past Medical History:  Diagnosis  Date  . Anemia    hx.  . Arthritis   . Diabetes mellitus without complication (HCC)    pt.states that he does not ever check blood sugars at home  . Headache   . High cholesterol   . History of kidney stones   . Hypertension   . Pneumonia    years ago     Family History  Problem Relation Age of Onset  . Strabismus Daughter      Social History   Socioeconomic History  . Marital status: Married    Spouse name: Not on file  . Number of children: Not on file  . Years of education: Not on file  . Highest education level: Not on file  Occupational History  . Not on file  Social Needs  . Financial resource strain: Not on file  . Food insecurity:    Worry: Not on file    Inability: Not on file  . Transportation needs:    Medical: Not on file    Non-medical: Not on file  Tobacco Use  . Smoking status: Former Smoker    Packs/day: 2.00    Years: 60.00    Pack years: 120.00    Types: Cigarettes    Last attempt to quit: 07/10/2017    Years since quitting: 1.0  . Smokeless tobacco: Never Used  Substance and Sexual Activity  . Alcohol use: No  . Drug use: No  . Sexual activity: Not on file  Lifestyle  . Physical activity:    Days per week: Not on file    Minutes per session: Not on file  . Stress: Not on file  Relationships  . Social connections:    Talks  on phone: Not on file    Gets together: Not on file    Attends religious service: Not on file    Active member of club or organization: Not on file    Attends meetings of clubs or organizations: Not on file    Relationship status: Not on file  . Intimate partner violence:    Fear of current or ex partner: Not on file    Emotionally abused: Not on file    Physically abused: Not on file    Forced sexual activity: Not on file  Other Topics Concern  . Not on file  Social History Narrative  . Not on file  has worked as Chartered certified accountant Has always lived in [place] No military   No Known Allergies   Outpatient Medications  Prior to Visit  Medication Sig Dispense Refill  . allopurinol (ZYLOPRIM) 300 MG tablet Take 300 mg by mouth daily.    Marland Kitchen amLODipine (NORVASC) 10 MG tablet Take 10 mg by mouth daily.    . Aspirin-Acetaminophen (GOODYS BODY PAIN PO) Take 3-4 packets by mouth daily as needed.    Marland Kitchen glimepiride (AMARYL) 4 MG tablet Take 2 mg by mouth daily with breakfast.    . ibuprofen (ADVIL,MOTRIN) 200 MG tablet Take 400 mg by mouth daily.    Marland Kitchen lisinopril (PRINIVIL,ZESTRIL) 40 MG tablet Take 20 mg by mouth daily.    . metoprolol tartrate (LOPRESSOR) 100 MG tablet Take 100 mg by mouth 2 (two) times daily.    Marland Kitchen oxymetazoline (AFRIN) 0.05 % nasal spray Place 2 sprays into both nostrils at bedtime.    . simvastatin (ZOCOR) 20 MG tablet Take 20 mg by mouth daily.    Marland Kitchen allopurinol (ZYLOPRIM) 300 MG tablet Take 300 mg by mouth 2 (two) times daily.    . fluticasone (FLONASE) 50 MCG/ACT nasal spray Place 2 sprays into both nostrils at bedtime.    Marland Kitchen lisinopril (PRINIVIL,ZESTRIL) 40 MG tablet Take 40 mg by mouth daily.    Marland Kitchen lovastatin (MEVACOR) 20 MG tablet Take 20 mg by mouth at bedtime.    . metFORMIN (GLUCOPHAGE) 500 MG tablet Take 1,000 mg by mouth 2 (two) times daily.     . methocarbamol (ROBAXIN) 500 MG tablet Take 1 tablet (500 mg total) by mouth every 6 (six) hours as needed for muscle spasms. (Patient not taking: Reported on 05/07/2018) 80 tablet 0  . metoprolol (LOPRESSOR) 100 MG tablet Take 100 mg by mouth daily.    Marland Kitchen oxyCODONE (OXY IR/ROXICODONE) 5 MG immediate release tablet Take 1-2 tablets (5-10 mg total) by mouth every 4 (four) hours as needed for moderate pain or severe pain. 80 tablet 0  . prednisoLONE acetate (PRED FORTE) 1 % ophthalmic suspension Place 1 drop 4 (four) times daily into the left eye. (Patient not taking: Reported on 05/07/2018) 10 mL 0  . rivaroxaban (XARELTO) 10 MG TABS tablet Take 1 tablet (10 mg total) by mouth daily with breakfast. Take Xarelto for two and a half more weeks following  discharge from the hospital, then discontinue Xarelto. Once the patient has completed the blood thinner regimen, then take a Baby 81 mg Aspirin daily for three more weeks. 19 tablet 0  . traMADol (ULTRAM) 50 MG tablet Take 1-2 tablets (50-100 mg total) by mouth every 8 (eight) hours as needed for moderate pain. 56 tablet 0   No facility-administered medications prior to visit.         Objective:   Physical Exam  Vitals:  08/01/18 0932  BP: (!) 168/70  Pulse: (!) 56  SpO2: 94%  Weight: 208 lb (94.3 kg)  Height: 5' 11.25" (1.81 m)   Gen: Pleasant elderly man, well-nourished, in no distress,  normal affect  ENT: No lesions,  mouth clear,  oropharynx clear, no postnasal drip  Neck: No JVD, no stridor  Lungs: No use of accessory muscles, few B basilar insp crackles that clear with deep inspiration, no wheeze  Cardiovascular: RRR, heart sounds normal, no murmur or gallops, no peripheral edema  Musculoskeletal: No deformities, no cyanosis or clubbing  Neuro: alert, non focal  Skin: Warm, no lesions or rash     Assessment & Plan:  Abnormal CXR Joshua [name] has some linear basilar atelectasis versus scar by chest x-ray 8/16.  No overt infiltrates.  Question whether this may be residual from previous pneumonia.  No more diffuse interstitial change noted.  He has some degree of restriction on his spirometry today.  We will discuss how to follow this either with serial chest x-rays or possibly a CT chest as we go forward.  I do not think this should limit his ability to proceed with his knee surgery.  COPD (chronic obstructive pulmonary disease) (HCC) As evidenced by his clinical symptoms, history of tobacco, mild to moderate mixed disease by his spirometry today.  He may ultimately benefit from a schedule bronchodilator.  I like to get full PFT first.  For now I will teach him how to use albuterol as needed, see if he benefits from this.  Nothing here to preclude him from proceeding  with his orthopedic surgery as long as he does not develop any signs of an acute exacerbation.  He falls into the mild to moderate risk classification.  We will forward this information to Dr. Despina HickAlusio.   Walking oximetry on room air today shows that you do not need oxygen while exerting Spirometry was performed today.  We will likely perform more complete pulmonary function testing in the future are We will teach you to use albuterol inhaler. Use 2 puffs up to every 4 hours if needed for shortness of breath, wheezing.  You may benefit from being on an every-day inhaler at some point. We will decide this based on your breathing testing and your function as we go forward You should get the flu shot this fall. Follow with Dr Delton CoombesByrum in 6 months or sooner if you have any problems  Levy Pupaobert Byrum, MD, PhD 08/01/2018, 10:29 AM [facility] [phone] or if no answer [phone]

## 2018-08-01 NOTE — Assessment & Plan Note (Signed)
Mr. Neale BurlyFreeman has some linear basilar atelectasis versus scar by chest x-ray 8/16.  No overt infiltrates.  Question whether this may be residual from previous pneumonia.  No more diffuse interstitial change noted.  He has some degree of restriction on his spirometry today.  We will discuss how to follow this either with serial chest x-rays or possibly a CT chest as we go forward.  I do not think this should limit his ability to proceed with his knee surgery.

## 2018-08-12 NOTE — H&P (Addendum)
TOTAL KNEE ADMISSION H&P  Patient is being admitted for left total knee arthroplasty.  Subjective:  Chief Complaint:left knee pain.  HPI: Joshua [name], 82 y.o. male, has a history of pain and functional disability in the left knee due to arthritis and has failed non-surgical conservative treatments for greater than 12 weeks to includecorticosteriod injections, viscosupplementation injections and activity modification.  Onset of symptoms was gradual, starting 8 years ago with gradually worsening course since that time. The patient noted no past surgery on the left knee(s).  Patient currently rates pain in the left knee(s) at 8 out of 10 with activity. Patient has worsening of pain with activity and weight bearing, pain that interferes with activities of daily living and instability.  Patient has evidence of bone-on-bone osteoarthritis with medial sclerosis and diffuse osteophyte formation by imaging studies. There is no active infection.  Patient Active Problem List   Diagnosis Date Noted  . Abnormal CXR 08/01/2018  . COPD (chronic obstructive pulmonary disease) (HCC) 08/01/2018  . OA (osteoarthritis) of knee 11/11/2017  . Myelopathy (HCC) 07/11/2017  . Essential hypertension 04/30/2017  . Diabetes mellitus type 2 in nonobese (HCC) 04/30/2017  . Hyperlipidemia 04/30/2017  . Right shoulder pain 04/30/2017   Past Medical History:  Diagnosis Date  . Anemia    hx.  . Arthritis   . Diabetes mellitus without complication (HCC)    pt.states that he does not ever check blood sugars at home  . Headache   . High cholesterol   . History of kidney stones   . Hypertension   . Pneumonia    years ago    Past Surgical History:  Procedure Laterality Date  . ANKLE FRACTURE SURGERY Left    when [age]- years old  . ANTERIOR CERVICAL DECOMP/DISCECTOMY FUSION N/A 07/11/2017   Procedure: Anterior Cervical Decompression/Discectomy Fusion  - Cervical three-four;  Surgeon: Donalee Citrin, MD;  Location: [facility]  OR;  Service: Neurosurgery;  Laterality: N/A; limited extension of neck currently   . CATARACT EXTRACTION Bilateral   . FRACTURE SURGERY      fx. lower left leg at age 68-13  . TOTAL KNEE ARTHROPLASTY Right 11/11/2017   Procedure: RIGHT TOTAL KNEE ARTHROPLASTY;  Surgeon: Ollen Gross, MD;  Location: [facility] ORS;  Service: Orthopedics;  Laterality: Right;    No current facility-administered medications for this encounter.    Current Outpatient Medications  Medication Sig Dispense Refill Last Dose  . albuterol (PROAIR HFA) 108 (90 Base) MCG/ACT inhaler Inhale 2 puffs into the lungs every 4 (four) hours as needed for wheezing or shortness of breath. 1 Inhaler 5   . allopurinol (ZYLOPRIM) 300 MG tablet Take 300 mg by mouth daily.   Taking  . amLODipine (NORVASC) 10 MG tablet Take 10 mg by mouth daily.   Taking  . Aspirin-Acetaminophen (GOODYS BODY PAIN PO) Take 3-4 packets by mouth daily as needed.   Taking  . glimepiride (AMARYL) 4 MG tablet Take 2 mg by mouth daily with breakfast.   Taking  . ibuprofen (ADVIL,MOTRIN) 200 MG tablet Take 400 mg by mouth daily.   Taking  . lisinopril (PRINIVIL,ZESTRIL) 40 MG tablet Take 20 mg by mouth daily.   Taking  . metoprolol tartrate (LOPRESSOR) 100 MG tablet Take 100 mg by mouth 2 (two) times daily.   Taking  . oxymetazoline (AFRIN) 0.05 % nasal spray Place 2 sprays into both nostrils at bedtime.   Taking  . simvastatin (ZOCOR) 20 MG tablet Take 20 mg by mouth daily.  Taking   No Known Allergies  Social History   Tobacco Use  . Smoking status: Former Smoker    Packs/day: 2.00    Years: 60.00    Pack years: 120.00    Types: Cigarettes    Last attempt to quit: 07/10/2017    Years since quitting: 1.0  . Smokeless tobacco: Never Used  Substance Use Topics  . Alcohol use: No    Family History  Problem Relation Age of Onset  . Strabismus Daughter      Review of Systems  Constitutional: Negative for chills and fever.  HENT: Negative for congestion,  sore throat and tinnitus.   Eyes: Negative for double vision, photophobia and pain.  Respiratory: Negative for cough, shortness of breath and wheezing.   Cardiovascular: Negative for chest pain, palpitations and orthopnea.  Gastrointestinal: Negative for heartburn, nausea and vomiting.  Genitourinary: Negative for dysuria, frequency and urgency.  Musculoskeletal: Positive for joint pain.  Neurological: Negative for dizziness, weakness and headaches.  Psychiatric/Behavioral: Negative for depression.    Objective:  Physical Exam  Well nourished and well developed. General: Alert and oriented x3, cooperative and pleasant, no acute distress. Head: normocephalic, atraumatic, neck supple. Eyes: EOMI. Respiratory: breath sounds clear in all fields, no wheezing, rales, or rhonchi. Cardiovascular: Regular rate and rhythm, no murmurs, gallops or rubs.  Abdomen: non-tender to palpation and soft, normoactive bowel sounds. Musculoskeletal: LEFT knee shows no effusion. Range of motion 10-110 with marked crepitus on range of motion. He is tender medial greater than lateral with no instability. There is a varus deformity. Calves soft and nontender. Motor function intact in LE. Strength 5/5 LE bilaterally. Neuro: Distal pulses 2+. Sensation to light touch intact in LE.  Vital signs in last 24 hours: Blood pressure: 162/80 mmHg  Labs:   Estimated body mass index is 28.81 kg/m as calculated from the following:   Height as of 08/01/18: 5' 11.25" (1.81 m).   Weight as of 08/01/18: 94.3 kg.   Imaging Review Plain radiographs demonstrate severe degenerative joint disease of the left knee(s). The overall alignment issignificant varus. The bone quality appears to be adequate for age and reported activity level.   Preoperative templating of the joint replacement has been completed, documented, and submitted to the Operating Room personnel in order to optimize intra-operative equipment  management.   Anticipated LOS equal to or greater than 2 midnights due to - Age 47 and older with one or more of the following:  - Obesity  - Expected need for hospital services (PT, OT, Nursing) required for safe  discharge  - Anticipated need for postoperative skilled nursing care or inpatient rehab  - Active co-morbidities: Diabetes and Anemia OR   - Unanticipated findings during/Post Surgery: None  - Patient is a high risk of re-admission due to: None     Assessment/Plan:  End stage arthritis, left knee   The patient history, physical examination, clinical judgment of the provider and imaging studies are consistent with end stage degenerative joint disease of the left knee(s) and total knee arthroplasty is deemed medically necessary. The treatment options including medical management, injection therapy arthroscopy and arthroplasty were discussed at length. The risks and benefits of total knee arthroplasty were presented and reviewed. The risks due to aseptic loosening, infection, stiffness, patella tracking problems, thromboembolic complications and other imponderables were discussed. The patient acknowledged the explanation, agreed to proceed with the plan and consent was signed. Patient is being admitted for inpatient treatment for surgery, pain control, PT,  OT, prophylactic antibiotics, VTE prophylaxis, progressive ambulation and ADL's and discharge planning. The patient is planning to be discharged home with home health services.  Therapy Plans: HHPT then outpatient therapy at [facility] in [place] Disposition: Home with wife Planned DVT Prophylaxis: aspirin 325 mg BID DME needed: None PCP: Dr. Jeannetta Nap  Cardiologist: Dr. Donnie Aho Pulmonologist: Dr. Delton Coombes (clearance provided) TXA: IV Allergies: None Other: Pt BP elevated at 162/80 mmHg during time of exam. Pt keeps record of BP measurements at home and believes this is elevated due to being in doctor's office  and discussion regarding surgery. Will continue to monitor BP at home, and instructed patient to contact PCP if this remains elevated.  - Patient was instructed on what medications to stop prior to surgery. - Follow-up visit in 2 weeks with Dr. Lequita Halt - Begin physical therapy following surgery - Pre-operative lab work as pre-surgical testing - Prescriptions will be provided in hospital at time of discharge  Arther Abbott, PA-C Orthopedic Surgery [facility] [place]

## 2018-08-28 ENCOUNTER — Other Ambulatory Visit (HOSPITAL_COMMUNITY): Payer: Self-pay | Admitting: *Deleted

## 2018-08-28 NOTE — Patient Instructions (Addendum)
Joshua [name]  08/28/2018   Your procedure is scheduled on: 09-08-18  Report to [facility] Main  Entrance  Report to admitting at 1010 AM    Call this number if you have problems the morning of surgery [phone]   Remember: Do not eat food or drink liquids :After Midnight. BRUSH YOUR TEETH MORNING OF SURGERY AND RINSE YOUR MOUTH OUT, NO CHEWING GUM CANDY OR MINTS.     Take these medicines the morning of surgery with A SIP OF WATER: ALBUTEROL INHALER IF NEEDED AND BRING INHALER WITH YOU,  METOPROLOL TARTRATE, AMLODIPINE, ALLOPURINOL  DO NOT TAKE ANY DIABETIC MEDICATIONS DAY OF YOUR SURGERY                  How to Manage Your Diabetes Before and After Surgery  Why is it important to control my blood sugar before and after surgery? . Improving blood sugar levels before and after surgery helps healing and can limit problems. . A way of improving blood sugar control is eating a healthy diet by: o  Eating less sugar and carbohydrates o  Increasing activity/exercise o  Talking with your doctor about reaching your blood sugar goals . High blood sugars (greater than 180 mg/dL) can raise your risk of infections and slow your recovery, so you will need to focus on controlling your diabetes during the weeks before surgery. . Make sure that the doctor who takes care of your diabetes knows about your planned surgery including the date and location.  How do I manage my blood sugar before surgery? . Check your blood sugar at least 4 times a day, starting 2 days before surgery, to make sure that the level is not too high or low. o Check your blood sugar the morning of your surgery when you wake up and every 2 hours until you get to the Short Stay unit. . If your blood sugar is less than 70 mg/dL, you will need to treat for low blood sugar: o Do not take insulin. o Treat a low blood sugar (less than 70 mg/dL) with  cup of clear juice (cranberry or apple), 4 glucose  tablets, OR glucose gel. o Recheck blood sugar in 15 minutes after treatment (to make sure it is greater than 70 mg/dL). If your blood sugar is not greater than 70 mg/dL on recheck, call [phone] for further instructions. . Report your blood sugar to the short stay nurse when you get to Short Stay.  . If you are admitted to the hospital after surgery: o Your blood sugar will be checked by the staff and you will probably be given insulin after surgery (instead of oral diabetes medicines) to make sure you have good blood sugar levels. o The goal for blood sugar control after surgery is 80-180 mg/dL.   WHAT DO I DO ABOUT MY DIABETES MEDICATION?  Marland Kitchen Do not take oral diabetes medicines (pills) the morning of surgery.  Evette Doffing BEFORE SURGERYTAKE YOUR GLIMPERIDE AS USUAL      . THE MORNING OF SURGERY DO NOT TAKE YOUR GLIMPERIDE..  . .  .  (  Patient Signature:  Date:   Nurse Signature:  Date:   Reviewed and Endorsed by [facility] Patient Education Committee, August 2015             You may not have any metal on your body including hair pins and  piercings  Do not wear jewelry, make-up, lotions, powders or perfumes, deodorant             Do not wear nail polish.  Do not shave  48 hours prior to surgery.              Men may shave face and neck.   Do not bring valuables to the hospital. [facility] IS NOT             RESPONSIBLE   FOR VALUABLES.  Contacts, dentures or bridgework may not be worn into surgery.  Leave suitcase in the car. After surgery it may be brought to your room.                  Please read over the following fact sheets you were given: _____________________________________________________________________             [facility] Pre[facility] Before surgery, you can play an important role.  Because skin is not sterile, your skin needs to be as free of germs as possible.  You can reduce the number of germs on your skin by washing with  CHG (chlorahexidine gluconate) soap before surgery.  CHG is an antiseptic cleaner which kills germs and bonds with the skin to continue killing germs even after washing. Please DO NOT use if you have an allergy to CHG or antibacterial soaps.  If your skin becomes reddened/irritated stop using the CHG and inform your nurse when you arrive at Short Stay. Do not shave (including legs and underarms) for at least 48 hours prior to the first CHG shower.  You may shave your face/neck. Please follow these instructions carefully:  1.  Shower with CHG Soap the night before surgery and the  morning of Surgery.  2.  If you choose to wash your hair, wash your hair first as usual with your  normal  shampoo.  3.  After you shampoo, rinse your hair and body thoroughly to remove the  shampoo.                           4.  Use CHG as you would any other liquid soap.  You can apply chg directly  to the skin and wash                       Gently with a scrungie or clean washcloth.  5.  Apply the CHG Soap to your body ONLY FROM THE NECK DOWN.   Do not use on face/ open                           Wound or open sores. Avoid contact with eyes, ears mouth and genitals (private parts).                       Wash face,  Genitals (private parts) with your normal soap.             6.  Wash thoroughly, paying special attention to the area where your surgery  will be performed.  7.  Thoroughly rinse your body with warm water from the neck down.  8.  DO NOT shower/wash with your normal soap after using and rinsing off  the CHG Soap.                9.  Pat yourself  dry with a clean towel.            10.  Wear clean pajamas.            11.  Place clean sheets on your bed the night of your first shower and do not  sleep with pets. Day of Surgery : Do not apply any lotions/deodorants the morning of surgery.  Please wear clean clothes to the hospital/surgery center.   Incentive Spirometer  An incentive spirometer is a tool that  can help keep your lungs clear and active. This tool measures how well you are filling your lungs with each breath. Taking long deep breaths may help reverse or decrease the chance of developing breathing (pulmonary) problems (especially infection) following:  A long period of time when you are unable to move or be active. BEFORE THE PROCEDURE   If the spirometer includes an indicator to show your best effort, your nurse or respiratory therapist will set it to a desired goal.  If possible, sit up straight or lean slightly forward. Try not to slouch.  Hold the incentive spirometer in an upright position. INSTRUCTIONS FOR USE  1. Sit on the edge of your bed if possible, or sit up as far as you can in bed or on a chair. 2. Hold the incentive spirometer in an upright position. 3. Breathe out normally. 4. Place the mouthpiece in your mouth and seal your lips tightly around it. 5. Breathe in slowly and as deeply as possible, raising the piston or the ball toward the top of the column. 6. Hold your breath for 3-5 seconds or for as long as possible. Allow the piston or ball to fall to the bottom of the column. 7. Remove the mouthpiece from your mouth and breathe out normally. 8. Rest for a few seconds and repeat Steps 1 through 7 at least 10 times every 1-2 hours when you are awake. Take your time and take a few normal breaths between deep breaths. 9. The spirometer may include an indicator to show your best effort. Use the indicator as a goal to work toward during each repetition. 10. After each set of 10 deep breaths, practice coughing to be sure your lungs are clear. If you have an incision (the cut made at the time of surgery), support your incision when coughing by placing a pillow or rolled up towels firmly against it. Once you are able to get out of bed, walk around indoors and cough well. You may stop using the incentive spirometer when instructed by your caregiver.  RISKS AND  COMPLICATIONS  Take your time so you do not get dizzy or light-headed.  If you are in pain, you may need to take or ask for pain medication before doing incentive spirometry. It is harder to take a deep breath if you are having pain. AFTER USE  Rest and breathe slowly and easily.  It can be helpful to keep track of a log of your progress. Your caregiver can provide you with a simple table to help with this. If you are using the spirometer at home, follow these instructions: SEEK MEDICAL CARE IF:   You are having difficultly using the spirometer.  You have trouble using the spirometer as often as instructed.  Your pain medication is not giving enough relief while using the spirometer.  You develop fever of 100.5 F (38.1 C) or higher. SEEK IMMEDIATE MEDICAL CARE IF:   You cough up bloody sputum that had not been  present before.  You develop fever of 102 F (38.9 C) or greater.  You develop worsening pain at or near the incision site. MAKE SURE YOU:   Understand these instructions.  Will watch your condition.  Will get help right away if you are not doing well or get worse. Document Released: 04/08/2007 Document Revised: 02/18/2012 Document Reviewed: 06/09/2007 Exit[employer] Patient Information 2014 ExitCare, [place].   ________________________________________________________________________  WHAT IS A BLOOD TRANSFUSION? Blood Transfusion Information  A transfusion is the replacement of blood or some of its parts. Blood is made up of multiple cells which provide different functions.  Red blood cells carry oxygen and are used for blood loss replacement.  White blood cells fight against infection.  Platelets control bleeding.  Plasma helps clot blood.  Other blood products are available for specialized needs, such as hemophilia or other clotting disorders. BEFORE THE TRANSFUSION  Who gives blood for transfusions?   Healthy volunteers who are fully evaluated to make sure  their blood is safe. This is blood bank blood. Transfusion therapy is the safest it has ever been in the practice of medicine. Before blood is taken from a donor, a complete history is taken to make sure that person has no history of diseases nor engages in risky social behavior (examples are intravenous drug use or sexual activity with multiple partners). The donor's travel history is screened to minimize risk of transmitting infections, such as malaria. The donated blood is tested for signs of infectious diseases, such as HIV and hepatitis. The blood is then tested to be sure it is compatible with you in order to minimize the chance of a transfusion reaction. If you or a relative donates blood, this is often done in anticipation of surgery and is not appropriate for emergency situations. It takes many days to process the donated blood. RISKS AND COMPLICATIONS Although transfusion therapy is very safe and saves many lives, the main dangers of transfusion include:   Getting an infectious disease.  Developing a transfusion reaction. This is an allergic reaction to something in the blood you were given. Every precaution is taken to prevent this. The decision to have a blood transfusion has been considered carefully by your caregiver before blood is given. Blood is not given unless the benefits outweigh the risks. AFTER THE TRANSFUSION  Right after receiving a blood transfusion, you will usually feel much better and more energetic. This is especially true if your red blood cells have gotten low (anemic). The transfusion raises the level of the red blood cells which carry oxygen, and this usually causes an energy increase.  The nurse administering the transfusion will monitor you carefully for complications. HOME CARE INSTRUCTIONS  No special instructions are needed after a transfusion. You may find your energy is better. Speak with your caregiver about any limitations on activity for underlying diseases  you may have. SEEK MEDICAL CARE IF:   Your condition is not improving after your transfusion.  You develop redness or irritation at the intravenous (IV) site. SEEK IMMEDIATE MEDICAL CARE IF:  Any of the following symptoms occur over the next 12 hours:  Shaking chills.  You have a temperature by mouth above 102 F (38.9 C), not controlled by medicine.  Chest, back, or muscle pain.  People around you feel you are not acting correctly or are confused.  Shortness of breath or difficulty breathing.  Dizziness and fainting.  You get a rash or develop hives.  You have a decrease in urine output.  Your urine turns a dark color or changes to pink, red, or brown. Any of the following symptoms occur over the next 10 days:  You have a temperature by mouth above 102 F (38.9 C), not controlled by medicine.  Shortness of breath.  Weakness after normal activity.  The white part of the eye turns yellow (jaundice).  You have a decrease in the amount of urine or are urinating less often.  Your urine turns a dark color or changes to pink, red, or brown. Document Released: 11/23/2000 Document Revised: 02/18/2012 Document Reviewed: 07/12/2008 [facility] Patient Information 2014 [place], [place].  _______________________________________________________________________

## 2018-08-28 NOTE — Progress Notes (Signed)
CHEST XRAY 07-25-18 Epic MEDICAL CLEARANCE NOTE DR Jeannetta NapELKINS 08-13-18 ON CHART FOR 09-08-18 SURGERY

## 2018-09-01 ENCOUNTER — Encounter (HOSPITAL_COMMUNITY): Payer: Self-pay

## 2018-09-01 ENCOUNTER — Encounter (HOSPITAL_COMMUNITY)
Admission: RE | Admit: 2018-09-01 | Discharge: 2018-09-01 | Disposition: A | Discharge status: Home / Self Care | Payer: Medicare Other | Source: Ambulatory Visit | Attending: Orthopedic Surgery | Admitting: Orthopedic Surgery

## 2018-09-01 ENCOUNTER — Other Ambulatory Visit: Payer: Self-pay

## 2018-09-01 DIAGNOSIS — Z981 Arthrodesis status: Secondary | ICD-10-CM | POA: Insufficient documentation

## 2018-09-01 DIAGNOSIS — E785 Hyperlipidemia, unspecified: Secondary | ICD-10-CM | POA: Diagnosis not present

## 2018-09-01 DIAGNOSIS — M1712 Unilateral primary osteoarthritis, left knee: Secondary | ICD-10-CM | POA: Diagnosis not present

## 2018-09-01 DIAGNOSIS — E119 Type 2 diabetes mellitus without complications: Secondary | ICD-10-CM | POA: Diagnosis not present

## 2018-09-01 DIAGNOSIS — Z87891 Personal history of nicotine dependence: Secondary | ICD-10-CM | POA: Diagnosis not present

## 2018-09-01 DIAGNOSIS — Z79899 Other long term (current) drug therapy: Secondary | ICD-10-CM | POA: Diagnosis not present

## 2018-09-01 DIAGNOSIS — Z7984 Long term (current) use of oral hypoglycemic drugs: Secondary | ICD-10-CM | POA: Diagnosis not present

## 2018-09-01 DIAGNOSIS — Z01812 Encounter for preprocedural laboratory examination: Secondary | ICD-10-CM | POA: Insufficient documentation

## 2018-09-01 DIAGNOSIS — Z87442 Personal history of urinary calculi: Secondary | ICD-10-CM | POA: Insufficient documentation

## 2018-09-01 DIAGNOSIS — I10 Essential (primary) hypertension: Secondary | ICD-10-CM | POA: Insufficient documentation

## 2018-09-01 DIAGNOSIS — J449 Chronic obstructive pulmonary disease, unspecified: Secondary | ICD-10-CM | POA: Insufficient documentation

## 2018-09-01 HISTORY — DX: Unspecified hearing loss, unspecified ear: H91.90

## 2018-09-01 LAB — COMPREHENSIVE METABOLIC PANEL
ALK PHOS: 76 U/L (ref 38–126)
ALT: 17 U/L (ref 0–44)
AST: 13 U/L — ABNORMAL LOW (ref 15–41)
Albumin: 4 g/dL (ref 3.5–5.0)
Anion gap: 8 (ref 5–15)
BILIRUBIN TOTAL: 0.7 mg/dL (ref 0.3–1.2)
BUN: 20 mg/dL (ref 8–23)
CALCIUM: 9.4 mg/dL (ref 8.9–10.3)
CO2: 24 mmol/L (ref 22–32)
CREATININE: 1.21 mg/dL (ref 0.61–1.24)
Chloride: 110 mmol/L (ref 98–111)
GFR, EST NON AFRICAN AMERICAN: 55 mL/min — AB (ref >60)
Glucose, Bld: 182 mg/dL — ABNORMAL HIGH (ref 70–99)
Potassium: 4 mmol/L (ref 3.5–5.1)
SODIUM: 142 mmol/L (ref 135–145)
Total Protein: 7.2 g/dL (ref 6.5–8.1)

## 2018-09-01 LAB — CBC
HEMATOCRIT: 42.4 % (ref 39.0–52.0)
HEMOGLOBIN: 14.1 g/dL (ref 13.0–17.0)
MCH: 31.1 pg (ref 26.0–34.0)
MCHC: 33.3 g/dL (ref 30.0–36.0)
MCV: 93.6 fL (ref 78.0–100.0)
Platelets: 206 10*3/uL (ref 150–400)
RBC: 4.53 MIL/uL (ref 4.22–5.81)
RDW: 15.1 % (ref 11.5–15.5)
WBC: 7.3 10*3/uL (ref 4.0–10.5)

## 2018-09-01 LAB — HEMOGLOBIN A1C
Hgb A1c MFr Bld: 8 % — ABNORMAL HIGH (ref 4.8–5.6)
MEAN PLASMA GLUCOSE: 182.9 mg/dL

## 2018-09-01 LAB — SURGICAL PCR SCREEN
MRSA, PCR: NEGATIVE
Staphylococcus aureus: NEGATIVE

## 2018-09-01 LAB — PROTIME-INR
INR: 0.87
Prothrombin Time: 11.7 seconds (ref 11.4–15.2)

## 2018-09-01 LAB — APTT: aPTT: 27 seconds (ref 24–36)

## 2018-09-01 LAB — GLUCOSE, CAPILLARY: GLUCOSE-CAPILLARY: 146 mg/dL — AB (ref 70–99)

## 2018-09-02 LAB — TYPE AND SCREEN
ABO/RH(D): A NEG
Antibody Screen: POSITIVE

## 2018-09-04 ENCOUNTER — Other Ambulatory Visit (HOSPITAL_COMMUNITY): Payer: Self-pay | Admitting: *Deleted

## 2018-09-04 NOTE — Progress Notes (Signed)
hemaglobin A1C results faxet odr aluisio pod

## 2018-09-04 NOTE — Progress Notes (Addendum)
ekg 8- 19-19 and lov /cardiology clearance 07-28-18 note dr Viann Fish on chart for 09-08-18 surgery.

## 2018-09-08 ENCOUNTER — Inpatient Hospital Stay (HOSPITAL_COMMUNITY): Admit: 2018-09-08 | Payer: Medicare Other | Admitting: Orthopedic Surgery

## 2018-09-08 ENCOUNTER — Encounter (HOSPITAL_COMMUNITY): Payer: Self-pay

## 2018-09-08 SURGERY — ARTHROPLASTY, KNEE, TOTAL
Anesthesia: Choice | Site: Knee | Laterality: Left

## 2018-11-12 ENCOUNTER — Encounter (INDEPENDENT_AMBULATORY_CARE_PROVIDER_SITE_OTHER): Payer: Medicare Other | Admitting: Ophthalmology

## 2018-11-25 NOTE — H&P (Signed)
TOTAL KNEE ADMISSION H&P  Patient is being admitted for left total knee arthroplasty.  Subjective:  Chief Complaint:left knee pain.  HPI: Joshua [name], 82 y.o. male, has a history of pain and functional disability in the left knee due to arthritis and has failed non-surgical conservative treatments for greater than 12 weeks to includecorticosteriod injections, viscosupplementation injections and activity modification.  Onset of symptoms was gradual, starting several years ago with gradually worsening course since that time. The patient noted no past surgery on the left knee(s).  Patient currently rates pain in the left knee(s) at 8 out of 10 with activity. Patient has worsening of pain with activity and weight bearing and instability.  Patient has evidence of tri-compartmental bone-on-bone arthritis by imaging studies. There is no active infection.  Patient Active Problem List   Diagnosis Date Noted  . Abnormal CXR 08/01/2018  . COPD (chronic obstructive pulmonary disease) (HCC) 08/01/2018  . OA (osteoarthritis) of knee 11/11/2017  . Myelopathy (HCC) 07/11/2017  . Essential hypertension 04/30/2017  . Diabetes mellitus type 2 in nonobese (HCC) 04/30/2017  . Hyperlipidemia 04/30/2017  . Right shoulder pain 04/30/2017   Past Medical History:  Diagnosis Date  . Anemia    hx.  . Arthritis   . Diabetes mellitus without complication (HCC)    type 2  . Headache   . High cholesterol   . History of kidney stones   . HOH (hard of hearing)    both ears  . Hypertension   . Pneumonia    years ago    Past Surgical History:  Procedure Laterality Date  . ANKLE FRACTURE SURGERY Left    when 26-20- years old  . ANTERIOR CERVICAL DECOMP/DISCECTOMY FUSION N/A 07/11/2017   Procedure: Anterior Cervical Decompression/Discectomy Fusion  - Cervical three-four;  Surgeon: Donalee Citrin, MD;  Location: [facility];  Service: Neurosurgery;  Laterality: N/A; limited extension of neck currently   . CATARACT  EXTRACTION Bilateral   . FRACTURE SURGERY      fx. lower left leg at age 97-13  . TOTAL KNEE ARTHROPLASTY Right 11/11/2017   Procedure: RIGHT TOTAL KNEE ARTHROPLASTY;  Surgeon: Ollen Gross, MD;  Location: [facility] ORS;  Service: Orthopedics;  Laterality: Right;    No current facility-administered medications for this encounter.    Current Outpatient Medications  Medication Sig Dispense Refill Last Dose  . albuterol (PROAIR HFA) 108 (90 Base) MCG/ACT inhaler Inhale 2 puffs into the lungs every 4 (four) hours as needed for wheezing or shortness of breath. 1 Inhaler 5   . allopurinol (ZYLOPRIM) 300 MG tablet Take 300 mg by mouth daily.   Taking  . amLODipine (NORVASC) 10 MG tablet Take 10 mg by mouth daily.   Taking  . Aspirin-Acetaminophen (GOODYS BODY PAIN PO) Take 1 packet by mouth 4 (four) times daily as needed (for pain/headaches.).    Taking  . Cholecalciferol (VITAMIN D3) 2000 units TABS Take 2,000 Units by mouth daily at 3 pm.     . glimepiride (AMARYL) 4 MG tablet Take 2 mg by mouth daily with breakfast.   Taking  . ibuprofen (ADVIL,MOTRIN) 200 MG tablet Take 400 mg by mouth daily as needed (pain.).     Marland Kitchen lisinopril (PRINIVIL,ZESTRIL) 40 MG tablet Take 20 mg by mouth daily.   Taking  . metoprolol tartrate (LOPRESSOR) 100 MG tablet Take 100 mg by mouth daily.    Taking  . oxymetazoline (AFRIN) 0.05 % nasal spray Place 2 sprays into both nostrils at bedtime.  Taking  . simvastatin (ZOCOR) 20 MG tablet Take 20 mg by mouth every evening.    Taking  . vitamin B-12 (CYANOCOBALAMIN) 1000 MCG tablet Take 1,000 mcg by mouth daily at 3 pm.      No Known Allergies  Social History   Tobacco Use  . Smoking status: Former Smoker    Packs/day: 2.00    Years: 60.00    Pack years: 120.00    Types: Cigarettes    Last attempt to quit: 07/10/2016    Years since quitting: 2.3  . Smokeless tobacco: Never Used  Substance Use Topics  . Alcohol use: Never    Frequency: Never    Family History    Problem Relation Age of Onset  . Strabismus Daughter      Review of Systems  Constitutional: Negative for chills and fever.  HENT: Negative for congestion, sore throat and tinnitus.   Eyes: Negative for double vision, photophobia and pain.  Respiratory: Negative for cough, shortness of breath and wheezing.   Cardiovascular: Negative for chest pain, palpitations and orthopnea.  Gastrointestinal: Negative for heartburn, nausea and vomiting.  Genitourinary: Negative for dysuria, frequency and urgency.  Musculoskeletal: Positive for joint pain.  Neurological: Negative for dizziness, weakness and headaches.    Objective:  Physical Exam  Well nourished and well developed.  General: Alert and oriented x3, cooperative and pleasant, no acute distress.  Head: normocephalic, atraumatic, neck supple.  Eyes: EOMI.  Respiratory: breath sounds clear in all fields, no wheezing, rales, or rhonchi. Cardiovascular: Regular rate and rhythm, no murmurs, gallops or rubs.  Abdomen: non-tender to palpation and soft, normoactive bowel sounds. Musculoskeletal: LEFT knee shows no effusion. Range of motion 10-110 with marked crepitus on range of motion. He is tender medial greater than lateral with no instability. There is a varus deformity. Calves soft and nontender. Motor function intact in LE. Strength 5/5 LE bilaterally. Neuro: Distal pulses 2+. Sensation to light touch intact in LE.  Vital signs in last 24 hours: Blood pressure: 152/86 mmHg Pulse: 60 bpm  Labs:   Estimated body mass index is 28.21 kg/m as calculated from the following:   Height as of 09/01/18: 6' (1.829 m).   Weight as of 09/01/18: 94.3 kg.   Imaging Review Plain radiographs demonstrate severe degenerative joint disease of the left knee(s). The overall alignment issignificant varus. The bone quality appears to be adequate for age and reported activity level.   Preoperative templating of the joint replacement has been  completed, documented, and submitted to the Operating Room personnel in order to optimize intra-operative equipment management.   Anticipated LOS equal to or greater than 2 midnights due to - Age 765 and older with one or more of the following:  - Obesity  - Expected need for hospital services (PT, OT, Nursing) required for safe  discharge  - Anticipated need for postoperative skilled nursing care or inpatient rehab  - Active co-morbidities: Diabetes OR   - Unanticipated findings during/Post Surgery: None  - Patient is a high risk of re-admission due to: None     Assessment/Plan:  End stage arthritis, left knee   The patient history, physical examination, clinical judgment of the provider and imaging studies are consistent with end stage degenerative joint disease of the left knee(s) and total knee arthroplasty is deemed medically necessary. The treatment options including medical management, injection therapy arthroscopy and arthroplasty were discussed at length. The risks and benefits of total knee arthroplasty were presented and reviewed. The risks  due to aseptic loosening, infection, stiffness, patella tracking problems, thromboembolic complications and other imponderables were discussed. The patient acknowledged the explanation, agreed to proceed with the plan and consent was signed. Patient is being admitted for inpatient treatment for surgery, pain control, PT, OT, prophylactic antibiotics, VTE prophylaxis, progressive ambulation and ADL's and discharge planning. The patient is planning to be discharged home.   Therapy Plans: outpatient therapy at [facility] Disposition: Home with wife and daughter Planned DVT Prophylaxis: Aspirin 325 mg BID DME needed: None PCP: Dr. Jeannetta Nap TXA: IV Allergies: NKDA Anesthesia Concerns: None Last HgbA1c: 8.0%. Pt's surgery was rescheduled due to elevated A1c, will recheck with pre-operative labs.  - Patient was instructed on what  medications to stop prior to surgery. - Follow-up visit in 2 weeks with Dr. Lequita Halt - Begin physical therapy following surgery - Pre-operative lab work as pre-surgical testing - Prescriptions will be provided in hospital at time of discharge  Arther Abbott, PA-C Orthopedic Surgery [facility]

## 2018-11-27 NOTE — Progress Notes (Signed)
[facility] - Clinic Note  12/01/2018     CHIEF COMPLAINT Patient presents for Retina Follow Up   HISTORY OF PRESENT ILLNESS: Joshua [name] is a 82 y.o. male who presents to the clinic today for:   HPI    Retina Follow Up    Patient presents with  Other (Lattice degeneration).  In both eyes.  Severity is moderate.  Duration of 6 months.  I, the attending physician,  performed the HPI with the patient and updated documentation appropriately.          Comments    Patient states vision the same OU. BS was 127 this am. Last A1c was unknown but too high for having knee surgery. No new floaters or flashes.        Last edited by Rennis Chris, MD on 12/01/2018  3:42 PM. (History)    pt states he feels like his vision is doing well, he is getting ready to have knee sx in, he was supposed to have it about 3 weeks ago, but his sugar was too high  Referring physician: Francene Boyers, MD [address] [place], [place] [address]  HISTORICAL INFORMATION:   Selected notes from the MEDICAL RECORD NUMBER Referral from Dr. Eugene Garnet for concern of ARMD OU;  LEE- 11.6.18 (Dr. Eugene Garnet) Ocular Hx- pseudophakia OU (Surgeon: Eps), DES OU PMH- elevated chol., DM, HTN   CURRENT MEDICATIONS: No current outpatient medications on file. (Ophthalmic Drugs)   No current facility-administered medications for this visit.  (Ophthalmic Drugs)   Current Outpatient Medications (Other)  Medication Sig  . albuterol (PROAIR HFA) 108 (90 Base) MCG/ACT inhaler Inhale 2 puffs into the lungs every 4 (four) hours as needed for wheezing or shortness of breath.  . allopurinol (ZYLOPRIM) 300 MG tablet Take 300 mg by mouth daily.  Marland Kitchen amLODipine (NORVASC) 10 MG tablet Take 10 mg by mouth daily.  . Aspirin-Acetaminophen (GOODYS BODY PAIN PO) Take 1 packet by mouth 4 (four) times daily as needed (for pain/headaches.).   Marland Kitchen Cholecalciferol (VITAMIN D3) 2000 units TABS Take 2,000 Units  by mouth daily at 3 pm.  . glimepiride (AMARYL) 4 MG tablet Take 2 mg by mouth daily with breakfast.  . ibuprofen (ADVIL,MOTRIN) 200 MG tablet Take 400 mg by mouth daily as needed (pain.).  Marland Kitchen lisinopril (PRINIVIL,ZESTRIL) 40 MG tablet Take 20 mg by mouth daily.  . metoprolol tartrate (LOPRESSOR) 100 MG tablet Take 100 mg by mouth daily.   Marland Kitchen oxymetazoline (AFRIN) 0.05 % nasal spray Place 2 sprays into both nostrils at bedtime.  . simvastatin (ZOCOR) 20 MG tablet Take 20 mg by mouth every evening.   . vitamin B-12 (CYANOCOBALAMIN) 1000 MCG tablet Take 1,000 mcg by mouth daily at 3 pm.   No current facility-administered medications for this visit.  (Other)      REVIEW OF SYSTEMS: ROS    Positive for: Eyes   Negative for: Constitutional, Gastrointestinal, Neurological, Skin, Genitourinary, Musculoskeletal, HENT, Endocrine, Cardiovascular, Respiratory, Psychiatric, Allergic/Imm, Heme/Lymph   Last edited by Annalee Genta D on 12/01/2018  3:06 PM. (History)       ALLERGIES No Known Allergies  PAST MEDICAL HISTORY Past Medical History:  Diagnosis Date  . Anemia    hx.  . Arthritis   . Diabetes mellitus without complication (HCC)    type 2  . Headache   . High cholesterol   . History of kidney stones   . HOH (hard of hearing)  both ears  . Hypertension   . Pneumonia    years ago   Past Surgical History:  Procedure Laterality Date  . ANKLE FRACTURE SURGERY Left    when 30-40- years old  . ANTERIOR CERVICAL DECOMP/DISCECTOMY FUSION N/A 07/11/2017   Procedure: Anterior Cervical Decompression/Discectomy Fusion  - Cervical three-four;  Surgeon: Donalee Citrin, MD;  Location: [facility] OR;  Service: Neurosurgery;  Laterality: N/A; limited extension of neck currently   . CATARACT EXTRACTION Bilateral   . FRACTURE SURGERY      fx. lower left leg at age 63-13  . TOTAL KNEE ARTHROPLASTY Right 11/11/2017   Procedure: RIGHT TOTAL KNEE ARTHROPLASTY;  Surgeon: Ollen Gross, MD;  Location: [facility] ORS;   Service: Orthopedics;  Laterality: Right;    FAMILY HISTORY Family History  Problem Relation Age of Onset  . Strabismus Daughter     SOCIAL HISTORY Social History   Tobacco Use  . Smoking status: Former Smoker    Packs/day: 2.00    Years: 60.00    Pack years: 120.00    Types: Cigarettes    Last attempt to quit: 07/10/2016    Years since quitting: 2.4  . Smokeless tobacco: Never Used  Substance Use Topics  . Alcohol use: Never    Frequency: Never  . Drug use: Never         OPHTHALMIC EXAM:  Base Eye Exam    Visual Acuity (Snellen - Linear)      Right Left   Dist [place] 20/20 -2 20/25 +2   Dist ph [place]  NI       Tonometry (Tonopen, 3:19 PM)      Right Left   Pressure 18 15       Pupils      Dark Light Shape React APD   Right 4 3 Round Brisk None   Left 4 3 Round Brisk None       Visual Fields (Counting fingers)      Left Right    Full Full       Extraocular Movement      Right Left    Full, Ortho Full, Ortho       Neuro/Psych    Oriented x3:  Yes   Mood/Affect:  Normal       Dilation    Both eyes:  1.0% Mydriacyl, 2.5% Phenylephrine @ 3:19 PM        Slit Lamp and Fundus Exam    Slit Lamp Exam      Right Left   Lids/Lashes Dermatochalasis - upper lid Dermatochalasis - upper lid   Conjunctiva/Sclera nasal and temporal Pinguecula temporal Pinguecula   Cornea subepithelial scarring para central, Arcus Round subepithelial scarring nasal para central, Arcus, dry tear film   Anterior Chamber Deep and quiet Deep and quiet   Iris Round and moderately dilated Round and dilated   Lens Three piece Posterior chamber intraocular lens in good position Three piece Posterior chamber intraocular lens in good position with open PC   Vitreous Vitreous syneresis, Posterior vitreous detachment Vitreous syneresis, Posterior vitreous detachment       Fundus Exam      Right Left   Disc Pink and Sharp Pink and Sharp   C/D Ratio 0.5 0.5   Macula Blunted foveal  reflex, Drusen, RPE mottling and clumping, No heme or edema Flat, Drusen, no heme or edema, Retinal pigment epithelial mottling   Vessels Vascular attenuation Normal   Periphery attached, Superior pigmented lattice degeneration with excellent laser surrounding  attached, Superior pigmented lattice degeneration with small atrophic holes - excellent laser surrounding          IMAGING AND PROCEDURES  Imaging and Procedures for 01/07/18  OCT, Retina - OU - Both Eyes       Right Eye Quality was good. Central Foveal Thickness: 254. Progression has been stable. Findings include normal foveal contour, no IRF, no SRF, retinal drusen .   Left Eye Quality was good. Central Foveal Thickness: 249. Progression has been stable. Findings include normal foveal contour, no IRF, no SRF, retinal drusen .   Notes Images taken, stored on drive  Diagnosis / Impression:  Mild Non-exudative ARMD OU OD: ?mild interval increase in drusen OS: interval increase in drusen  Clinical management:  See below  Abbreviations: NFP - Normal foveal profile. CME - cystoid macular edema. PED - pigment epithelial detachment. IRF - intraretinal fluid. SRF - subretinal fluid. EZ - ellipsoid zone. ERM - epiretinal membrane. ORA - outer retinal atrophy. ORT - outer retinal tubulation. 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 Diabetes mellitus type 2 without retinopathy (HCC) E11.9   4. Pseudophakia Z96.1   5. Retinal edema H35.81 OCT, Retina - OU - Both Eyes    1. Lattice degeneration OU-   - pigmented lattice superiorly with atrophic holes  - s/p laser retinopexy OS (11.13.18), s/p laser retinopexy OD (11.27.18)  - excellent laser in place  - no new RT/RD or SRF  2. Age related macular degeneration,  non-exudative, both eyes -- early  - OCT shows mild interval increase in drusen  - The incidence, anatomy, and pathology of dry AMD, risk of progression, and the AREDS and AREDS 2 study including smoking risks discussed with patient.  - Recommend amsler grid monitoring  - will continue to monitor  - f/u in 6-9 mos, DFE, OCT, MRx  3. DM2 w/o retinopathy The incidence, risk factors for progression, natural history and treatment options for diabetic retinopathy  were discussed with patient.  The need for close monitoring of blood glucose, blood pressure, and serum lipids, avoiding cigarette or any type of tobacco, and the need for long term follow up was also discussed with patient. - monitor  4. Pseudophakia OU  - s/p CE/IOL OU  - beautiful surgery, doing well  - monitor   Ophthalmic Meds Ordered this visit:  No orders of the defined types were placed in this encounter.      Return for F/U 6-9 months, non-exu ARMD OU, DFE, OCT.  There are no Patient Instructions on file for this visit.   Explained the diagnoses, plan, and follow up with the patient and they expressed understanding.  Patient expressed understanding of the importance of proper follow up care.   This document serves as a record of services personally performed by Karie ChimeraBrian G. Perlie Stene, MD, PhD. It was created on their behalf by Laurian BrimAmanda Brown, OA, an ophthalmic assistant. The creation of this record is the provider's dictation and/or activities during the visit.    Electronically signed by: Laurian BrimAmanda Brown, OA  12.19.19 2:31 PM    Karie ChimeraBrian G. Ashaunti Treptow, M.D., Ph.D. Diseases & Surgery of the Retina and Vitreous [facility]  I have reviewed the above documentation  for accuracy and completeness, and I agree with the above. Karie ChimeraBrian G. Janaysia Mcleroy, M.D., Ph.D. 12/06/18 2:39 PM    Abbreviations: M myopia (nearsighted); A astigmatism; H hyperopia (farsighted); P presbyopia; Mrx spectacle prescription;  CTL contact  lenses; OD right eye; OS left eye; OU both eyes  XT exotropia; ET esotropia; PEK punctate epithelial keratitis; PEE punctate epithelial erosions; DES dry eye syndrome; MGD meibomian gland dysfunction; ATs artificial tears; PFAT's preservative free artificial tears; NSC nuclear sclerotic cataract; PSC posterior subcapsular cataract; ERM epi-retinal membrane; PVD posterior vitreous detachment; RD retinal detachment; DM diabetes mellitus; DR diabetic retinopathy; NPDR non-proliferative diabetic retinopathy; PDR proliferative diabetic retinopathy; CSME clinically significant macular edema; DME diabetic macular edema; dbh dot blot hemorrhages; CWS cotton wool spot; POAG primary open angle glaucoma; C/D cup-to-disc ratio; HVF humphrey visual field; GVF goldmann visual field; OCT optical coherence tomography; IOP intraocular pressure; BRVO Branch retinal vein occlusion; CRVO central retinal vein occlusion; CRAO central retinal artery occlusion; BRAO branch retinal artery occlusion; RT retinal tear; SB scleral buckle; PPV pars plana vitrectomy; VH Vitreous hemorrhage; PRP panretinal laser photocoagulation; IVK intravitreal kenalog; VMT vitreomacular traction; MH Macular hole;  NVD neovascularization of the disc; NVE neovascularization elsewhere; AREDS age related eye disease study; ARMD age related macular degeneration; POAG primary open angle glaucoma; EBMD epithelial/anterior basement membrane dystrophy; ACIOL anterior chamber intraocular lens; IOL intraocular lens; PCIOL posterior chamber intraocular lens; Phaco/IOL phacoemulsification with intraocular lens placement; PRK photorefractive keratectomy; LASIK laser assisted in situ keratomileusis; HTN hypertension; DM diabetes mellitus; COPD chronic obstructive pulmonary disease

## 2018-11-28 ENCOUNTER — Encounter (INDEPENDENT_AMBULATORY_CARE_PROVIDER_SITE_OTHER): Payer: Medicare Other | Admitting: Ophthalmology

## 2018-12-01 ENCOUNTER — Ambulatory Visit (INDEPENDENT_AMBULATORY_CARE_PROVIDER_SITE_OTHER): Payer: Medicare Other | Admitting: Ophthalmology

## 2018-12-01 ENCOUNTER — Encounter (INDEPENDENT_AMBULATORY_CARE_PROVIDER_SITE_OTHER): Payer: Self-pay | Admitting: Ophthalmology

## 2018-12-01 DIAGNOSIS — H3581 Retinal edema: Secondary | ICD-10-CM | POA: Diagnosis not present

## 2018-12-01 DIAGNOSIS — H35413 Lattice degeneration of retina, bilateral: Secondary | ICD-10-CM | POA: Diagnosis not present

## 2018-12-01 DIAGNOSIS — Z961 Presence of intraocular lens: Secondary | ICD-10-CM | POA: Diagnosis not present

## 2018-12-01 DIAGNOSIS — E119 Type 2 diabetes mellitus without complications: Secondary | ICD-10-CM

## 2018-12-01 DIAGNOSIS — H353131 Nonexudative age-related macular degeneration, bilateral, early dry stage: Secondary | ICD-10-CM

## 2018-12-05 ENCOUNTER — Other Ambulatory Visit (HOSPITAL_COMMUNITY): Payer: Self-pay | Admitting: *Deleted

## 2018-12-05 NOTE — Patient Instructions (Addendum)
Ruthe MannanLarry D Hannis  12/05/2018   Your procedure is scheduled on: 12-15-18  Report to [facility] Main  Entrance  Report to admitting at 900 AM    Call this number if you have problems the morning of surgery [phone]   Remember: Do not eat food or drink liquids :After Midnight. BRUSH YOUR TEETH MORNING OF SURGERY AND RINSE YOUR MOUTH OUT, NO CHEWING GUM CANDY OR MINTS.  How to Manage Your Diabetes Before and After Surgery  Why is it important to control my blood sugar before and after surgery? . Improving blood sugar levels before and after surgery helps healing and can limit problems. . A way of improving blood sugar control is eating a healthy diet by: o  Eating less sugar and carbohydrates o  Increasing activity/exercise o  Talking with your doctor about reaching your blood sugar goals . High blood sugars (greater than 180 mg/dL) can raise your risk of infections and slow your recovery, so you will need to focus on controlling your diabetes during the weeks before surgery. . Make sure that the doctor who takes care of your diabetes knows about your planned surgery including the date and location.  How do I manage my blood sugar before surgery? . Check your blood sugar at least 4 times a day, starting 2 days before surgery, to make sure that the level is not too high or low. o Check your blood sugar the morning of your surgery when you wake up and every 2 hours until you get to the Short Stay unit. . If your blood sugar is less than 70 mg/dL, you will need to treat for low blood sugar: o Do not take insulin. o Treat a low blood sugar (less than 70 mg/dL) with  cup of clear juice (cranberry or apple), 4 glucose tablets, OR glucose gel. o Recheck blood sugar in 15 minutes after treatment (to make sure it is greater than 70 mg/dL). If your blood sugar is not greater than 70 mg/dL on recheck, call [phone] for further instructions. . Report your blood sugar to  the short stay nurse when you get to Short Stay.  . If you are admitted to the hospital after surgery: o Your blood sugar will be checked by the staff and you will probably be given insulin after surgery (instead of oral diabetes medicines) to make sure you have good blood sugar levels. o The goal for blood sugar control after surgery is 80-180 mg/dL.   WHAT DO I DO ABOUT MY DIABETES MEDICATION?  Marland Kitchen. Do not take oral diabetes medicines (pills) the morning of surgery.  THE DAY BEFORE SURGERYTAKE YOUR GLIMPERIDE USUAL . THE MORNING OF SURGERY DO NOT TAKE YOUR GLIMPERIDE .  Patient Signature:  Date:   Nurse Signature:  Date:   Reviewed and Endorsed by [facility] Patient Education Committee, August 2015   Take these medicines the morning of surgery with A SIP OF WATER: ALBUTEROL INHALER IF NEEDED AND BRING INHALER, ALLOPURINOL, METOPROLOL TARTRATE, MALODIPINE (NORVASC) DO NOT TAKE ANY DIABETIC MEDICATIONS DAY OF YOUR SURGERY                               You may not have any metal on your body including hair pins and              piercings  Do  not wear jewelry, make-up, lotions, powders or perfumes, deodorant             Do not wear nail polish.  Do not shave  48 hours prior to surgery.              Men may shave face and neck.   Do not bring valuables to the hospital. [facility] IS NOT             RESPONSIBLE   FOR VALUABLES.  Contacts, dentures or bridgework may not be worn into surgery.  Leave suitcase in the car. After surgery it may be brought to your room.                  Please read over the following fact sheets you were given: _____________________________________________________________________  [facility] - Preparing for Surgery Before surgery, you can play an important role.  Because skin is not sterile, your skin needs to be as free of germs as possible.  You can reduce the number of germs on your skin by washing with CHG (chlorahexidine gluconate) soap before  surgery.  CHG is an antiseptic cleaner which kills germs and bonds with the skin to continue killing germs even after washing. Please DO NOT use if you have an allergy to CHG or antibacterial soaps.  If your skin becomes reddened/irritated stop using the CHG and inform your nurse when you arrive at Short Stay. Do not shave (including legs and underarms) for at least 48 hours prior to the first CHG shower.  You may shave your face/neck. Please follow these instructions carefully:  1.  Shower with CHG Soap the night before surgery and the  morning of Surgery.  2.  If you choose to wash your hair, wash your hair first as usual with your  normal  shampoo.  3.  After you shampoo, rinse your hair and body thoroughly to remove the  shampoo.                           4.  Use CHG as you would any other liquid soap.  You can apply chg directly  to the skin and wash                       Gently with a scrungie or clean washcloth.  5.  Apply the CHG Soap to your body ONLY FROM THE NECK DOWN.   Do not use on face/ open                           Wound or open sores. Avoid contact with eyes, ears mouth and genitals (private parts).                       Wash face,  Genitals (private parts) with your normal soap.             6.  Wash thoroughly, paying special attention to the area where your surgery  will be performed.  7.  Thoroughly rinse your body with warm water from the neck down.  8.  DO NOT shower/wash with your normal soap after using and rinsing off  the CHG Soap.                9.  Pat yourself dry with a clean towel.  10.  Wear clean pajamas.            11.  Place clean sheets on your bed the night of your first shower and do not  sleep with pets. Day of Surgery : Do not apply any lotions/deodorants the morning of surgery.  Please wear clean clothes to the hospital/surgery center.  FAILURE TO FOLLOW THESE INSTRUCTIONS MAY RESULT IN THE CANCELLATION OF YOUR SURGERY PATIENT  SIGNATURE_________________________________  NURSE SIGNATURE__________________________________  ________________________________________________________________________   Adam Phenix  An incentive spirometer is a tool that can help keep your lungs clear and active. This tool measures how well you are filling your lungs with each breath. Taking long deep breaths may help reverse or decrease the chance of developing breathing (pulmonary) problems (especially infection) following:  A long period of time when you are unable to move or be active. BEFORE THE PROCEDURE   If the spirometer includes an indicator to show your best effort, your nurse or respiratory therapist will set it to a desired goal.  If possible, sit up straight or lean slightly forward. Try not to slouch.  Hold the incentive spirometer in an upright position. INSTRUCTIONS FOR USE  1. Sit on the edge of your bed if possible, or sit up as far as you can in bed or on a chair. 2. Hold the incentive spirometer in an upright position. 3. Breathe out normally. 4. Place the mouthpiece in your mouth and seal your lips tightly around it. 5. Breathe in slowly and as deeply as possible, raising the piston or the ball toward the top of the column. 6. Hold your breath for 3-5 seconds or for as long as possible. Allow the piston or ball to fall to the bottom of the column. 7. Remove the mouthpiece from your mouth and breathe out normally. 8. Rest for a few seconds and repeat Steps 1 through 7 at least 10 times every 1-2 hours when you are awake. Take your time and take a few normal breaths between deep breaths. 9. The spirometer may include an indicator to show your best effort. Use the indicator as a goal to work toward during each repetition. 10. After each set of 10 deep breaths, practice coughing to be sure your lungs are clear. If you have an incision (the cut made at the time of surgery), support your incision when coughing  by placing a pillow or rolled up towels firmly against it. Once you are able to get out of bed, walk around indoors and cough well. You may stop using the incentive spirometer when instructed by your caregiver.  RISKS AND COMPLICATIONS  Take your time so you do not get dizzy or light-headed.  If you are in pain, you may need to take or ask for pain medication before doing incentive spirometry. It is harder to take a deep breath if you are having pain. AFTER USE  Rest and breathe slowly and easily.  It can be helpful to keep track of a log of your progress. Your caregiver can provide you with a simple table to help with this. If you are using the spirometer at home, follow these instructions: [place] IF:   You are having difficultly using the spirometer.  You have trouble using the spirometer as often as instructed.  Your pain medication is not giving enough relief while using the spirometer.  You develop fever of 100.5 F (38.1 C) or higher. SEEK IMMEDIATE MEDICAL CARE IF:   You cough up bloody  sputum that had not been present before.  You develop fever of 102 F (38.9 C) or greater.  You develop worsening pain at or near the incision site. MAKE SURE YOU:   Understand these instructions.  Will watch your condition.  Will get help right away if you are not doing well or get worse. Document Released: 04/08/2007 Document Revised: 02/18/2012 Document Reviewed: 06/09/2007 Exit[employer] Patient Information 2014 Exit[employer], [place].   ________________________________________________________________________  WHAT IS A BLOOD TRANSFUSION? Blood Transfusion Information  A transfusion is the replacement of blood or some of its parts. Blood is made up of multiple cells which provide different functions.  Red blood cells carry oxygen and are used for blood loss replacement.  White blood cells fight against infection.  Platelets control bleeding.  Plasma helps clot  blood.  Other blood products are available for specialized needs, such as hemophilia or other clotting disorders. BEFORE THE TRANSFUSION  Who gives blood for transfusions?   Healthy volunteers who are fully evaluated to make sure their blood is safe. This is blood bank blood. Transfusion therapy is the safest it has ever been in the practice of medicine. Before blood is taken from a donor, a complete history is taken to make sure that person has no history of diseases nor engages in risky social behavior (examples are intravenous drug use or sexual activity with multiple partners). The donor's travel history is screened to minimize risk of transmitting infections, such as malaria. The donated blood is tested for signs of infectious diseases, such as HIV and hepatitis. The blood is then tested to be sure it is compatible with you in order to minimize the chance of a transfusion reaction. If you or a relative donates blood, this is often done in anticipation of surgery and is not appropriate for emergency situations. It takes many days to process the donated blood. RISKS AND COMPLICATIONS Although transfusion therapy is very safe and saves many lives, the main dangers of transfusion include:   Getting an infectious disease.  Developing a transfusion reaction. This is an allergic reaction to something in the blood you were given. Every precaution is taken to prevent this. The decision to have a blood transfusion has been considered carefully by your caregiver before blood is given. Blood is not given unless the benefits outweigh the risks. AFTER THE TRANSFUSION  Right after receiving a blood transfusion, you will usually feel much better and more energetic. This is especially true if your red blood cells have gotten low (anemic). The transfusion raises the level of the red blood cells which carry oxygen, and this usually causes an energy increase.  The nurse administering the transfusion will monitor  you carefully for complications. HOME CARE INSTRUCTIONS  No special instructions are needed after a transfusion. You may find your energy is better. Speak with your caregiver about any limitations on activity for underlying diseases you may have. SEEK MEDICAL CARE IF:   Your condition is not improving after your transfusion.  You develop redness or irritation at the intravenous (IV) site. SEEK IMMEDIATE MEDICAL CARE IF:  Any of the following symptoms occur over the next 12 hours:  Shaking chills.  You have a temperature by mouth above 102 F (38.9 C), not controlled by medicine.  Chest, back, or muscle pain.  People around you feel you are not acting correctly or are confused.  Shortness of breath or difficulty breathing.  Dizziness and fainting.  You get a rash or develop hives.  You have a  decrease in urine output.  Your urine turns a dark color or changes to pink, red, or brown. Any of the following symptoms occur over the next 10 days:  You have a temperature by mouth above 102 F (38.9 C), not controlled by medicine.  Shortness of breath.  Weakness after normal activity.  The white part of the eye turns yellow (jaundice).  You have a decrease in the amount of urine or are urinating less often.  Your urine turns a dark color or changes to pink, red, or brown. Document Released: 11/23/2000 Document Revised: 02/18/2012 Document Reviewed: 07/12/2008 [facility] Patient Information 2014 [place], [place].  _______________________________________________________________________

## 2018-12-06 ENCOUNTER — Encounter (INDEPENDENT_AMBULATORY_CARE_PROVIDER_SITE_OTHER): Payer: Self-pay | Admitting: Ophthalmology

## 2018-12-08 ENCOUNTER — Encounter (HOSPITAL_COMMUNITY)
Admission: RE | Admit: 2018-12-08 | Discharge: 2018-12-08 | Disposition: A | Discharge status: Home / Self Care | Payer: Medicare Other | Source: Ambulatory Visit | Attending: Orthopedic Surgery | Admitting: Orthopedic Surgery

## 2018-12-08 ENCOUNTER — Encounter (HOSPITAL_COMMUNITY): Payer: Self-pay

## 2018-12-08 ENCOUNTER — Encounter (HOSPITAL_COMMUNITY): Payer: Medicare Other

## 2018-12-08 ENCOUNTER — Other Ambulatory Visit: Payer: Self-pay

## 2018-12-08 DIAGNOSIS — Z01812 Encounter for preprocedural laboratory examination: Secondary | ICD-10-CM | POA: Insufficient documentation

## 2018-12-08 LAB — COMPREHENSIVE METABOLIC PANEL
ALT: 16 U/L (ref 0–44)
AST: 16 U/L (ref 15–41)
Albumin: 3.9 g/dL (ref 3.5–5.0)
Alkaline Phosphatase: 70 U/L (ref 38–126)
Anion gap: 8 (ref 5–15)
BUN: 22 mg/dL (ref 8–23)
CHLORIDE: 110 mmol/L (ref 98–111)
CO2: 23 mmol/L (ref 22–32)
Calcium: 8.9 mg/dL (ref 8.9–10.3)
Creatinine, Ser: 1.27 mg/dL — ABNORMAL HIGH (ref 0.61–1.24)
GFR, EST NON AFRICAN AMERICAN: 53 mL/min — AB (ref >60)
Glucose, Bld: 142 mg/dL — ABNORMAL HIGH (ref 70–99)
POTASSIUM: 4.5 mmol/L (ref 3.5–5.1)
Sodium: 141 mmol/L (ref 135–145)
Total Bilirubin: 0.8 mg/dL (ref 0.3–1.2)
Total Protein: 6.6 g/dL (ref 6.5–8.1)

## 2018-12-08 LAB — HEMOGLOBIN A1C
HEMOGLOBIN A1C: 7.6 % — AB (ref 4.8–5.6)
Mean Plasma Glucose: 171.42 mg/dL

## 2018-12-08 LAB — CBC
HCT: 44.2 % (ref 39.0–52.0)
Hemoglobin: 14 g/dL (ref 13.0–17.0)
MCH: 31.3 pg (ref 26.0–34.0)
MCHC: 31.7 g/dL (ref 30.0–36.0)
MCV: 98.7 fL (ref 80.0–100.0)
Platelets: 102 K/uL — ABNORMAL LOW (ref 150–400)
RBC: 4.48 MIL/uL (ref 4.22–5.81)
RDW: 13.9 % (ref 11.5–15.5)
WBC: 6.9 K/uL (ref 4.0–10.5)
nRBC: 0 % (ref 0.0–0.2)

## 2018-12-08 LAB — GLUCOSE, CAPILLARY: Glucose-Capillary: 185 mg/dL — ABNORMAL HIGH (ref 70–99)

## 2018-12-08 LAB — PROTIME-INR
INR: 1
PROTHROMBIN TIME: 13.1 s (ref 11.4–15.2)

## 2018-12-08 LAB — SURGICAL PCR SCREEN
MRSA, PCR: NEGATIVE
Staphylococcus aureus: NEGATIVE

## 2018-12-08 LAB — APTT: aPTT: 28 s (ref 24–36)

## 2018-12-08 NOTE — Progress Notes (Addendum)
PCP: wilson elkins  CARDIOLOGIST:dr spencer tilley  INFO IN Epic: hemaglobin a1c 7.6 (dr aluisio guidelines 7.5 or below usually), cbc platlets 102  INFO ON CHART: cardiac clearance note dr Donnie Ahotilley  07-28-18 ekg dr Donnie Ahotilley 07-28-18 patient cancelled for sept 2019 surgery due to hemaglobin a1c 8.0  BLOOD THINNERS AND LAST DOSES: none  ____________________________________  PATIENT SYMPTOMS AT TIME OF PREOP: none Chart left with jessica zanett pa for follow up cbc and hemaglobin a1c results

## 2018-12-08 NOTE — Progress Notes (Signed)
ekg 07-28-18 dr Donnie Ahotilley on chart Cardiac  Clearance/lov dr Donnie Ahotilley 10-18-18 on chart  office visit note dr Donnie Ahotilley 07-28-18 opn chart

## 2018-12-09 ENCOUNTER — Encounter (HOSPITAL_COMMUNITY): Payer: Self-pay | Admitting: Physician Assistant

## 2018-12-09 NOTE — Progress Notes (Addendum)
Anesthesia Chart Review   Case:  [id number] Date/Time:  12/15/18 1120   Procedure:  LEFT TOTAL KNEE ARTHROPLASTY (Left Knee) - 50min   Anesthesia type:  Choice   Pre-op diagnosis:  left knee osteoarthritis   Location:  [facility] ROOM 09 / [facility]   Surgeon:  Ollen GrossAluisio, Frank, MD      DISCUSSION: 82 yo former smoker (120 pack years, quit 07/10/16) with h/o HTN, HLD, DM II (surgery previously cancelled due to A1C of 8, 12/08/18 A1C 7.6, routed to surgeon), COPD, hard of hearing in both ears scheduled for above surgery on 12/15/18 with Dr. Lequita HaltAluisio.    Pt with h/o COPD, last seen by pulmonologist Dr. Levy Pupaobert Byrum on 08/01/18.  Linear basilar atelectasis vs scar on chest xray with some degree of restriction on spirometry.  Per note by Dr. Delton CoombesByrum, pt "is at low to moderate risk for any surgery due to his underlyng lung disease.  This includes increased risk for prolonged hospitalization, ventilation or even death.  We saw him in office on 08/01/18 and he is at baseline.  If the benefits outweigh these risks then there is no absolute contraindication to proceed."  Pt was last seen by cardiology on 10/18/17, Dr. Viann FishSpencer Tilley.  Pt with well controlled HTN.  He denies PND, lower extremity edema, chest pain, and can participate in activity without shortness of breath.  Physical activity limited by knee pain.  Underwent cardiac cath 13 years ago with normal findings.  Normal EKG per Dr. Donnie Ahoilley, he also reports echo from many years ago with normal ejection fraction.  Per Dr. Donnie Ahoilley pt cleared for surgery, "From a cardiovascular viewpoint may proceed with the planned knee replacement surgery.  No additional cardiovascular workup is necessary."  Pt can proceed with planned procedure barring acute status change.  VS: BP (!) 143/65   Pulse (!) 53   Temp 36.4 C (Oral)   Ht 6\' 1"  (1.854 m)   Wt 93.4 kg   SpO2 96%   BMI 27.18 kg/m   PROVIDERS: Kaleen MaskElkins, Wilson Oliver, MD is PCP   Levy PupaByrum, Robert, MD is Pulmonologist    Viann Fishilley, Spencer, MD is Cardiologist  LABS: Labs reviewed: Acceptable for surgery. (all labs ordered are listed, but only abnormal results are displayed)  Labs Reviewed  CBC - Abnormal; Notable for the following components:      Result Value   Platelets 102 (*)    All other components within normal limits  COMPREHENSIVE METABOLIC PANEL - Abnormal; Notable for the following components:   Glucose, Bld 142 (*)    Creatinine, Ser 1.27 (*)    GFR calc non Af Amer 53 (*)    All other components within normal limits  HEMOGLOBIN A1C - Abnormal; Notable for the following components:   Hgb A1c MFr Bld 7.6 (*)    All other components within normal limits  GLUCOSE, CAPILLARY - Abnormal; Notable for the following components:   Glucose-Capillary 185 (*)    All other components within normal limits  SURGICAL PCR SCREEN  APTT  PROTIME-INR  TYPE AND SCREEN     IMAGES: Chest Xray 10/11/17  FINDINGS: Linear atelectasis or fibrosis in the lung bases, mildly more prominent than previous study. No focal consolidation. No blunting of costophrenic angles. No pneumothorax. Heart size and pulmonary vascularity are normal. Degenerative changes in the spine.  IMPRESSION: Increasing linear atelectasis or fibrosis in the lung bases since previous study. No focal consolidation.  EKG: 07/02/17 Sinus bradycardia with 1st degree A-V block  Possible inferior infarct, age undetermined Abnormal ECG Premature atrial complexes no longer present   CV:  Past Medical History:  Diagnosis Date  . Anemia    hx.  . Arthritis   . Diabetes mellitus without complication (HCC)    type 2  . Headache   . High cholesterol   . History of kidney stones   . HOH (hard of hearing)    both ears  . Hypertension   . Pneumonia    years ago    Past Surgical History:  Procedure Laterality Date  . ANKLE FRACTURE SURGERY Left    when 6912-82- years old  . ANTERIOR CERVICAL DECOMP/DISCECTOMY FUSION N/A 07/11/2017    Procedure: Anterior Cervical Decompression/Discectomy Fusion  - Cervical three-four;  Surgeon: Donalee Citrinram, Gary, MD;  [facility];  Service: Neurosurgery;  Laterality: N/A; limited extension of neck currently   . CATARACT EXTRACTION Bilateral   . FRACTURE SURGERY      fx. lower left leg at age 80-13  . TOTAL KNEE ARTHROPLASTY Right 11/11/2017   Procedure: RIGHT TOTAL KNEE ARTHROPLASTY;  Surgeon: Ollen GrossAluisio, Frank, MD;  Location: [facility];  Service: Orthopedics;  Laterality: Right;    MEDICATIONS: . albuterol (PROAIR HFA) 108 (90 Base) MCG/ACT inhaler  . allopurinol (ZYLOPRIM) 300 MG tablet  . amLODipine (NORVASC) 10 MG tablet  . Aspirin-Acetaminophen (GOODYS BODY PAIN PO)  . Cholecalciferol (VITAMIN D3) 50 MCG (2000 UT) TABS  . glimepiride (AMARYL) 4 MG tablet  . lisinopril (PRINIVIL,ZESTRIL) 40 MG tablet  . metoprolol tartrate (LOPRESSOR) 100 MG tablet  . oxymetazoline (AFRIN) 0.05 % nasal spray  . simvastatin (ZOCOR) 20 MG tablet  . vitamin B-12 (CYANOCOBALAMIN) 1000 MCG tablet   No current facility-administered medications for this encounter.     Janey GentaJessica Jiyaan Steinhauser, PA-C [facility] Pre-surgical Testing [phone] 12/11/18 11:19 AM

## 2018-12-11 LAB — TYPE AND SCREEN
ABO/RH(D): A NEG
ANTIBODY SCREEN: POSITIVE
Unit division: 0

## 2018-12-11 LAB — BPAM RBC
Blood Product Expiration Date: 202001312359
Unit Type and Rh: 600

## 2018-12-11 NOTE — Anesthesia Preprocedure Evaluation (Deleted)
Anesthesia Evaluation    Airway        Dental   Pulmonary former smoker,           Cardiovascular hypertension,      Neuro/Psych    GI/Hepatic   Endo/Other  diabetes  Renal/GU      Musculoskeletal   Abdominal   Peds  Hematology   Anesthesia Other Findings   Reproductive/Obstetrics                             Anesthesia Physical Anesthesia Plan  ASA:   Anesthesia Plan:    Post-op Pain Management:    Induction:   PONV Risk Score and Plan:   Airway Management Planned:   Additional Equipment:   Intra-op Plan:   Post-operative Plan:   Informed Consent:   Plan Discussed with:   Anesthesia Plan Comments: (See PST note on 12/08/18, Jodell Cipro, PA-C)        Anesthesia Quick Evaluation

## 2019-02-18 NOTE — H&P (Signed)
TOTAL KNEE ADMISSION H&P  Patient is being admitted for left total knee arthroplasty.  Subjective:  Chief Complaint:left knee pain.  HPI: Joshua [name], 83 y.o. male, has a history of pain and functional disability in the left knee due to arthritis and has failed non-surgical conservative treatments for greater than 12 weeks to includecorticosteriod injections, viscosupplementation injections and activity modification.  Onset of symptoms was gradual, starting several years ago with gradually worsening course since that time. The patient noted no past surgery on the left knee(s).  Patient currently rates pain in the left knee(s) at 8 out of 10 with activity. Patient has worsening of pain with activity and weight bearing and instability.  Patient has evidence of tri-compartmental bone-on-bone arthritis by imaging studies. There is no active infection.  Patient Active Problem List   Diagnosis Date Noted   Abnormal CXR 08/01/2018   COPD (chronic obstructive pulmonary disease) (HCC) 08/01/2018   OA (osteoarthritis) of knee 11/11/2017   Myelopathy (HCC) 07/11/2017   Essential hypertension 04/30/2017   Diabetes mellitus type 2 in nonobese (HCC) 04/30/2017   Hyperlipidemia 04/30/2017   Right shoulder pain 04/30/2017   Past Medical History:  Diagnosis Date   Anemia    hx.   Arthritis    Diabetes mellitus without complication (HCC)    type 2   Headache    High cholesterol    History of kidney stones    HOH (hard of hearing)    both ears   Hypertension    Pneumonia    years ago    Past Surgical History:  Procedure Laterality Date   ANKLE FRACTURE SURGERY Left    when 72-8- years old   ANTERIOR CERVICAL DECOMP/DISCECTOMY FUSION N/A 07/11/2017   Procedure: Anterior Cervical Decompression/Discectomy Fusion  - Cervical three-four;  Surgeon: Donalee Citrin, MD;  Location: [facility] OR;  Service: Neurosurgery;  Laterality: N/A; limited extension of neck currently    CATARACT  EXTRACTION Bilateral    FRACTURE SURGERY      fx. lower left leg at age 49-13   TOTAL KNEE ARTHROPLASTY Right 11/11/2017   Procedure: RIGHT TOTAL KNEE ARTHROPLASTY;  Surgeon: Ollen Gross, MD;  Location: [facility] ORS;  Service: Orthopedics;  Laterality: Right;    No current facility-administered medications for this encounter.    Current Outpatient Medications  Medication Sig Dispense Refill Last Dose   albuterol (PROAIR HFA) 108 (90 Base) MCG/ACT inhaler Inhale 2 puffs into the lungs every 4 (four) hours as needed for wheezing or shortness of breath. 1 Inhaler 5 Taking   allopurinol (ZYLOPRIM) 300 MG tablet Take 300 mg by mouth daily.   Taking   amLODipine (NORVASC) 10 MG tablet Take 10 mg by mouth daily.   Taking   Aspirin-Acetaminophen (GOODYS BODY PAIN PO) Take 1 packet by mouth 4 (four) times daily as needed (for pain/headaches.).    Taking   Cholecalciferol (VITAMIN D3) 50 MCG (2000 UT) TABS Take 1 tablet by mouth daily. 3pm      lisinopril (PRINIVIL,ZESTRIL) 40 MG tablet Take 20 mg by mouth daily.   Taking   metoprolol tartrate (LOPRESSOR) 100 MG tablet Take 100 mg by mouth daily.    Taking   oxymetazoline (AFRIN) 0.05 % nasal spray Place 2 sprays into both nostrils at bedtime.   Taking   simvastatin (ZOCOR) 20 MG tablet Take 20 mg by mouth every evening.    Taking   vitamin B-12 (CYANOCOBALAMIN) 1000 MCG tablet Take 1,000 mcg by mouth daily at 3 pm.  Taking   glimepiride (AMARYL) 4 MG tablet Take 2 mg by mouth daily with breakfast.   Taking   No Known Allergies  Social History   Tobacco Use   Smoking status: Former Smoker    Packs/day: 2.00    Years: 60.00    Pack years: 120.00    Types: Cigarettes    Last attempt to quit: 07/10/2016    Years since quitting: 2.6   Smokeless tobacco: Never Used  Substance Use Topics   Alcohol use: Never    Frequency: Never    Family History  Problem Relation Age of Onset   Strabismus Daughter      Review of Systems    Constitutional: Negative for chills and fever.  HENT: Negative for congestion, sore throat and tinnitus.   Eyes: Negative for double vision, photophobia and pain.  Respiratory: Negative for cough, shortness of breath and wheezing.   Cardiovascular: Negative for chest pain, palpitations and orthopnea.  Gastrointestinal: Negative for heartburn, nausea and vomiting.  Genitourinary: Negative for dysuria, frequency and urgency.  Musculoskeletal: Positive for joint pain.  Neurological: Negative for dizziness, weakness and headaches.    Objective:  Physical Exam  Well nourished and well developed.  General: Alert and oriented x3, cooperative and pleasant, no acute distress.  Head: normocephalic, atraumatic, neck supple.  Eyes: EOMI.  Respiratory: breath sounds clear in all fields, no wheezing, rales, or rhonchi. Cardiovascular: Regular rate and rhythm, no murmurs, gallops or rubs.  Abdomen: non-tender to palpation and soft, normoactive bowel sounds. Musculoskeletal:  LEFT knee shows no effusion. Range of motion 10-110 with marked crepitus on range of motion. He is tender medial greater than lateral with no instability. There is a varus deformity.  Calves soft and nontender. Motor function intact in LE. Strength 5/5 LE bilaterally. Neuro: Distal pulses 2+. Sensation to light touch intact in LE.  Labs:   Estimated body mass index is 27.18 kg/m as calculated from the following:   Height as of 12/08/18: 6\' 1"  (1.854 m).   Weight as of 12/08/18: 93.4 kg.   Imaging Review Plain radiographs demonstrate severe degenerative joint disease of the left knee(s). The overall alignment issignificant varus. The bone quality appears to be adequate for age and reported activity level.      Assessment/Plan:  End stage arthritis, left knee   The patient history, physical examination, clinical judgment of the provider and imaging studies are consistent with end stage degenerative joint disease  of the left knee(s) and total knee arthroplasty is deemed medically necessary. The treatment options including medical management, injection therapy arthroscopy and arthroplasty were discussed at length. The risks and benefits of total knee arthroplasty were presented and reviewed. The risks due to aseptic loosening, infection, stiffness, patella tracking problems, thromboembolic complications and other imponderables were discussed. The patient acknowledged the explanation, agreed to proceed with the plan and consent was signed. Patient is being admitted for inpatient treatment for surgery, pain control, PT, OT, prophylactic antibiotics, VTE prophylaxis, progressive ambulation and ADL's and discharge planning. The patient is planning to be discharged home.    Anticipated LOS equal to or greater than 2 midnights due to - Age 53 and older with one or more of the following:  - Obesity  - Expected need for hospital services (PT, OT, Nursing) required for safe  discharge  - Anticipated need for postoperative skilled nursing care or inpatient rehab  - Active co-morbidities: Diabetes OR   - Unanticipated findings during/Post Surgery: None  - Patient is  a high risk of re-admission due to: None   Therapy Plans: HHPT then outpatient therapy Disposition: Home with wife and daughter Planned DVT Prophylaxis: aspirin 325mg  BID DME needed: None PCP: Dr. Jeannetta NapElkins TXA:IV Allergies: NKDA  Anesthesia Concerns: None BMI: 25.8 Last HgbA1c: Unsure. Will add to pre-op labs.  - Patient was instructed on what medications to stop prior to surgery. - Follow-up visit in 2 weeks with Dr. Lequita HaltAluisio - Begin physical therapy following surgery - Pre-operative lab work as pre-surgical testing - Prescriptions will be provided in hospital at time of discharge  Arther AbbottKristie Anye Brose, PA-C Orthopedic Surgery [facility] [place]

## 2019-02-19 NOTE — Patient Instructions (Signed)
Joshua [name]  02/19/2019   Your procedure is scheduled on: 03-02-19  Report to [facility] Main  Entrance               Report to admitting at         0800  AM    Call this number if you have problems the morning of surgery [phone]    Remember: Do not eat food or drink liquids :After Midnight.  BRUSH YOUR TEETH MORNING OF SURGERY AND RINSE YOUR MOUTH OUT, NO CHEWING GUM CANDY OR MINTS.     Take these medicines the morning of surgery with A SIP OF WATER: metoprolol, amlodipine, allopurinol, inhaler and bring it with you             DO NOT TAKE ANY MEDICATION THE DAY OF YOUR SURGERY                               You may not have any metal on your body including hair pins and              piercings  Do not wear jewelry,  lotions, powders or perfumes, deodorant                      Men may shave face and neck.   Do not bring valuables to the hospital. [facility] IS NOT             RESPONSIBLE   FOR VALUABLES.  Contacts, dentures or bridgework may not be worn into surgery.  Leave suitcase in the car. After surgery it may be brought to your room.                Please read over the following fact sheets you were given: ____________________________________________________________________          [facility] - Preparing for Surgery Before surgery, you can play an important role.  Because skin is not sterile, your skin needs to be as free of germs as possible.  You can reduce the number of germs on your skin by washing with CHG (chlorahexidine gluconate) soap before surgery.  CHG is an antiseptic cleaner which kills germs and bonds with the skin to continue killing germs even after washing. Please DO NOT use if you have an allergy to CHG or antibacterial soaps.  If your skin becomes reddened/irritated stop using the CHG and inform your nurse when you arrive at Short Stay. Do not shave (including legs and underarms) for at least 48 hours prior to the first  CHG shower.  You may shave your face/neck. Please follow these instructions carefully:  1.  Shower with CHG Soap the night before surgery and the  morning of Surgery.  2.  If you choose to wash your hair, wash your hair first as usual with your  normal  shampoo.  3.  After you shampoo, rinse your hair and body thoroughly to remove the  shampoo.                           4.  Use CHG as you would any other liquid soap.  You can apply chg directly  to the skin and wash  Gently with a scrungie or clean washcloth.  5.  Apply the CHG Soap to your body ONLY FROM THE NECK DOWN.   Do not use on face/ open                           Wound or open sores. Avoid contact with eyes, ears mouth and genitals (private parts).                       Wash face,  Genitals (private parts) with your normal soap.             6.  Wash thoroughly, paying special attention to the area where your surgery  will be performed.  7.  Thoroughly rinse your body with warm water from the neck down.  8.  DO NOT shower/wash with your normal soap after using and rinsing off  the CHG Soap.                9.  Pat yourself dry with a clean towel.            10.  Wear clean pajamas.            11.  Place clean sheets on your bed the night of your first shower and do not  sleep with pets. Day of Surgery : Do not apply any lotions/deodorants the morning of surgery.  Please wear clean clothes to the hospital/surgery center.  FAILURE TO FOLLOW THESE INSTRUCTIONS MAY RESULT IN THE CANCELLATION OF YOUR SURGERY PATIENT SIGNATURE_________________________________  NURSE SIGNATURE__________________________________  ________________________________________________________________________  WHAT IS A BLOOD TRANSFUSION? Blood Transfusion Information  A transfusion is the replacement of blood or some of its parts. Blood is made up of multiple cells which provide different functions.  Red blood cells carry oxygen and are used  for blood loss replacement.  White blood cells fight against infection.  Platelets control bleeding.  Plasma helps clot blood.  Other blood products are available for specialized needs, such as hemophilia or other clotting disorders. BEFORE THE TRANSFUSION  Who gives blood for transfusions?   Healthy volunteers who are fully evaluated to make sure their blood is safe. This is blood bank blood. Transfusion therapy is the safest it has ever been in the practice of medicine. Before blood is taken from a donor, a complete history is taken to make sure that person has no history of diseases nor engages in risky social behavior (examples are intravenous drug use or sexual activity with multiple partners). The donor's travel history is screened to minimize risk of transmitting infections, such as malaria. The donated blood is tested for signs of infectious diseases, such as HIV and hepatitis. The blood is then tested to be sure it is compatible with you in order to minimize the chance of a transfusion reaction. If you or a relative donates blood, this is often done in anticipation of surgery and is not appropriate for emergency situations. It takes many days to process the donated blood. RISKS AND COMPLICATIONS Although transfusion therapy is very safe and saves many lives, the main dangers of transfusion include:   Getting an infectious disease.  Developing a transfusion reaction. This is an allergic reaction to something in the blood you were given. Every precaution is taken to prevent this. The decision to have a blood transfusion has been considered carefully by your caregiver before blood is given. Blood is not given unless the benefits outweigh  the risks. AFTER THE TRANSFUSION  Right after receiving a blood transfusion, you will usually feel much better and more energetic. This is especially true if your red blood cells have gotten low (anemic). The transfusion raises the level of the red blood  cells which carry oxygen, and this usually causes an energy increase.  The nurse administering the transfusion will monitor you carefully for complications. HOME CARE INSTRUCTIONS  No special instructions are needed after a transfusion. You may find your energy is better. Speak with your caregiver about any limitations on activity for underlying diseases you may have. SEEK MEDICAL CARE IF:   Your condition is not improving after your transfusion.  You develop redness or irritation at the intravenous (IV) site. SEEK IMMEDIATE MEDICAL CARE IF:  Any of the following symptoms occur over the next 12 hours:  Shaking chills.  You have a temperature by mouth above 102 F (38.9 C), not controlled by medicine.  Chest, back, or muscle pain.  People around you feel you are not acting correctly or are confused.  Shortness of breath or difficulty breathing.  Dizziness and fainting.  You get a rash or develop hives.  You have a decrease in urine output.  Your urine turns a dark color or changes to pink, red, or brown. Any of the following symptoms occur over the next 10 days:  You have a temperature by mouth above 102 F (38.9 C), not controlled by medicine.  Shortness of breath.  Weakness after normal activity.  The white part of the eye turns yellow (jaundice).  You have a decrease in the amount of urine or are urinating less often.  Your urine turns a dark color or changes to pink, red, or brown. Document Released: 11/23/2000 Document Revised: 02/18/2012 Document Reviewed: 07/12/2008 Exit[employer] Patient Information 2014 [place], [place].  _______________________________________________________________________  Incentive Spirometer  An incentive spirometer is a tool that can help keep your lungs clear and active. This tool measures how well you are filling your lungs with each breath. Taking long deep breaths may help reverse or decrease the chance of developing breathing  (pulmonary) problems (especially infection) following:  A long period of time when you are unable to move or be active. BEFORE THE PROCEDURE   If the spirometer includes an indicator to show your best effort, your nurse or respiratory therapist will set it to a desired goal.  If possible, sit up straight or lean slightly forward. Try not to slouch.  Hold the incentive spirometer in an upright position. INSTRUCTIONS FOR USE  1. Sit on the edge of your bed if possible, or sit up as far as you can in bed or on a chair. 2. Hold the incentive spirometer in an upright position. 3. Breathe out normally. 4. Place the mouthpiece in your mouth and seal your lips tightly around it. 5. Breathe in slowly and as deeply as possible, raising the piston or the ball toward the top of the column. 6. Hold your breath for 3-5 seconds or for as long as possible. Allow the piston or ball to fall to the bottom of the column. 7. Remove the mouthpiece from your mouth and breathe out normally. 8. Rest for a few seconds and repeat Steps 1 through 7 at least 10 times every 1-2 hours when you are awake. Take your time and take a few normal breaths between deep breaths. 9. The spirometer may include an indicator to show your best effort. Use the indicator as a goal to work  toward during each repetition. 10. After each set of 10 deep breaths, practice coughing to be sure your lungs are clear. If you have an incision (the cut made at the time of surgery), support your incision when coughing by placing a pillow or rolled up towels firmly against it. Once you are able to get out of bed, walk around indoors and cough well. You may stop using the incentive spirometer when instructed by your caregiver.  RISKS AND COMPLICATIONS  Take your time so you do not get dizzy or light-headed.  If you are in pain, you may need to take or ask for pain medication before doing incentive spirometry. It is harder to take a deep breath if you  are having pain. AFTER USE  Rest and breathe slowly and easily.  It can be helpful to keep track of a log of your progress. Your caregiver can provide you with a simple table to help with this. If you are using the spirometer at home, follow these instructions: [place] IF:   You are having difficultly using the spirometer.  You have trouble using the spirometer as often as instructed.  Your pain medication is not giving enough relief while using the spirometer.  You develop fever of 100.5 F (38.1 C) or higher. SEEK IMMEDIATE MEDICAL CARE IF:   You cough up bloody sputum that had not been present before.  You develop fever of 102 F (38.9 C) or greater.  You develop worsening pain at or near the incision site. MAKE SURE YOU:   Understand these instructions.  Will watch your condition.  Will get help right away if you are not doing well or get worse. Document Released: 04/08/2007 Document Revised: 02/18/2012 Document Reviewed: 06/09/2007 [facility] Patient Information 2014 [place], [place].   ________________________________________________________________________

## 2019-02-24 ENCOUNTER — Encounter (HOSPITAL_COMMUNITY)
Admission: RE | Admit: 2019-02-24 | Discharge: 2019-02-24 | Disposition: A | Discharge status: Home / Self Care | Payer: Medicare Other | Source: Ambulatory Visit | Attending: Orthopedic Surgery | Admitting: Orthopedic Surgery

## 2019-02-24 ENCOUNTER — Other Ambulatory Visit: Payer: Self-pay

## 2019-02-24 ENCOUNTER — Encounter (HOSPITAL_COMMUNITY): Payer: Self-pay

## 2019-02-24 DIAGNOSIS — Z01818 Encounter for other preprocedural examination: Secondary | ICD-10-CM | POA: Diagnosis not present

## 2019-02-24 DIAGNOSIS — M1712 Unilateral primary osteoarthritis, left knee: Secondary | ICD-10-CM | POA: Insufficient documentation

## 2019-02-24 DIAGNOSIS — I498 Other specified cardiac arrhythmias: Secondary | ICD-10-CM | POA: Diagnosis not present

## 2019-02-24 DIAGNOSIS — R9431 Abnormal electrocardiogram [ECG] [EKG]: Secondary | ICD-10-CM | POA: Diagnosis not present

## 2019-02-24 HISTORY — DX: Chronic obstructive pulmonary disease, unspecified: J44.9

## 2019-02-24 LAB — CBC
HCT: 46.6 % (ref 39.0–52.0)
Hemoglobin: 14.6 g/dL (ref 13.0–17.0)
MCH: 31.5 pg (ref 26.0–34.0)
MCHC: 31.3 g/dL (ref 30.0–36.0)
MCV: 100.4 fL — ABNORMAL HIGH (ref 80.0–100.0)
PLATELETS: 114 10*3/uL — AB (ref 150–400)
RBC: 4.64 MIL/uL (ref 4.22–5.81)
RDW: 15.1 % (ref 11.5–15.5)
WBC: 7.9 10*3/uL (ref 4.0–10.5)
nRBC: 0 % (ref 0.0–0.2)

## 2019-02-24 LAB — BASIC METABOLIC PANEL
Anion gap: 9 (ref 5–15)
BUN: 19 mg/dL (ref 8–23)
CO2: 22 mmol/L (ref 22–32)
Calcium: 9.2 mg/dL (ref 8.9–10.3)
Chloride: 109 mmol/L (ref 98–111)
Creatinine, Ser: 1.15 mg/dL (ref 0.61–1.24)
GFR calc Af Amer: >60 mL/min (ref >60)
GFR, EST NON AFRICAN AMERICAN: 60 mL/min — AB (ref >60)
Glucose, Bld: 173 mg/dL — ABNORMAL HIGH (ref 70–99)
Potassium: 4.1 mmol/L (ref 3.5–5.1)
Sodium: 140 mmol/L (ref 135–145)

## 2019-02-24 LAB — HEMOGLOBIN A1C
Hgb A1c MFr Bld: 7.8 % — ABNORMAL HIGH (ref 4.8–5.6)
Mean Plasma Glucose: 177.16 mg/dL

## 2019-02-24 LAB — SURGICAL PCR SCREEN
MRSA, PCR: NEGATIVE
Staphylococcus aureus: NEGATIVE

## 2019-02-25 LAB — TYPE AND SCREEN
ABO/RH(D): A NEG
Antibody Screen: POSITIVE

## 2019-03-02 ENCOUNTER — Ambulatory Visit (HOSPITAL_COMMUNITY): Admission: RE | Admit: 2019-03-02 | Payer: Medicare Other | Source: Home / Self Care | Admitting: Orthopedic Surgery

## 2019-03-02 ENCOUNTER — Encounter (HOSPITAL_COMMUNITY): Admission: RE | Payer: Self-pay | Source: Home / Self Care

## 2019-03-02 SURGERY — ARTHROPLASTY, KNEE, TOTAL
Anesthesia: Choice | Site: Knee | Laterality: Left

## 2019-05-20 NOTE — H&P (Signed)
TOTAL KNEE ADMISSION H&P  Patient is being admitted for left total knee arthroplasty.  Subjective:  Chief Complaint:left knee pain.  HPI: Joshua [name], 83 y.o. male, has a history of pain and functional disability in the left knee due to arthritis and has failed non-surgical conservative treatments for greater than 12 weeks to includecorticosteriod injections, viscosupplementation injections and activity modification.  Onset of symptoms was gradual, starting several years ago with gradually worsening course since that time. The patient noted no past surgery on the left knee(s).  Patient currently rates pain in the left knee(s) at 9 out of 10 with activity. Patient has worsening of pain with activity and weight bearing, crepitus and instability.  Patient has evidence of periarticular osteophytes and joint space narrowing by imaging studies. There is no active infection.  Patient Active Problem List   Diagnosis Date Noted  . Abnormal CXR 08/01/2018  . COPD (chronic obstructive pulmonary disease) ([place]) 08/01/2018  . OA (osteoarthritis) of knee 11/11/2017  . Myelopathy ([place]) 07/11/2017  . Essential hypertension 04/30/2017  . Diabetes mellitus type 2 in nonobese ([place]) 04/30/2017  . Hyperlipidemia 04/30/2017  . Right shoulder pain 04/30/2017   Past Medical History:  Diagnosis Date  . Anemia    hx.  . Arthritis   . COPD (chronic obstructive pulmonary disease) ([place])   . Diabetes mellitus without complication ([place])    type 2  . Headache   . High cholesterol   . History of kidney stones   . HOH (hard of hearing)    both ears  . Hypertension   . Pneumonia    years ago    Past Surgical History:  Procedure Laterality Date  . ANKLE FRACTURE SURGERY Left    when 34-63- years old  . ANTERIOR CERVICAL DECOMP/DISCECTOMY FUSION N/A 07/11/2017   Procedure: Anterior Cervical Decompression/Discectomy Fusion  - Cervical three-four;  Surgeon: Kary Kos, MD;  Location: [facility];  Service:  Neurosurgery;  Laterality: N/A; limited extension of neck currently   . CATARACT EXTRACTION Bilateral   . FRACTURE SURGERY      fx. lower left leg at age 84-13  . TOTAL KNEE ARTHROPLASTY Right 11/11/2017   Procedure: RIGHT TOTAL KNEE ARTHROPLASTY;  Surgeon: Gaynelle Arabian, MD;  Location: [facility];  Service: Orthopedics;  Laterality: Right;    No current facility-administered medications for this encounter.    Current Outpatient Medications  Medication Sig Dispense Refill Last Dose  . albuterol (PROAIR HFA) 108 (90 Base) MCG/ACT inhaler Inhale 2 puffs into the lungs every 4 (four) hours as needed for wheezing or shortness of breath. 1 Inhaler 5 Haven't needed  . allopurinol (ZYLOPRIM) 300 MG tablet Take 300 mg by mouth daily.   Taking  . amLODipine (NORVASC) 10 MG tablet Take 10 mg by mouth daily.   Taking  . Aspirin-Acetaminophen-Caffeine (GOODY HEADACHE PO) Take 1 Package by mouth every 8 (eight) hours as needed (pain).     . Cholecalciferol (VITAMIN D3) 50 MCG (2000 UT) TABS Take 50 mcg by mouth daily at 3 pm. 3pm      . glipiZIDE (GLUCOTROL XL) 10 MG 24 hr tablet Take 20 mg by mouth daily with breakfast.     . lisinopril (PRINIVIL,ZESTRIL) 40 MG tablet Take 40 mg by mouth daily.    Taking  . methocarbamol (ROBAXIN) 500 MG tablet Take 500 mg by mouth every 8 (eight) hours as needed (pain).     . metoprolol tartrate (LOPRESSOR) 100 MG tablet Take 100 mg by mouth daily.  Taking  . nateglinide (STARLIX) 120 MG tablet Take 120 mg by mouth 3 (three) times daily before meals.     Marland Kitchen. oxymetazoline (AFRIN) 0.05 % nasal spray Place 2 sprays into both nostrils at bedtime.   Taking  . simvastatin (ZOCOR) 20 MG tablet Take 20 mg by mouth every evening.    Taking  . vitamin B-12 (CYANOCOBALAMIN) 1000 MCG tablet Take 1,000 mcg by mouth daily at 3 pm.   Taking   No Known Allergies  Social History   Tobacco Use  . Smoking status: Former Smoker    Packs/day: 2.00    Years: 60.00    Pack years: 120.00     Types: Cigarettes    Last attempt to quit: 07/10/2016    Years since quitting: 2.8  . Smokeless tobacco: Never Used  Substance Use Topics  . Alcohol use: Never    Frequency: Never    Family History  Problem Relation Age of Onset  . Strabismus Daughter      Review of Systems  Constitutional: Negative for chills and fever.  HENT: Negative for congestion, sore throat and tinnitus.   Eyes: Negative for double vision, photophobia and pain.  Respiratory: Negative for cough, shortness of breath and wheezing.   Cardiovascular: Negative for chest pain, palpitations and orthopnea.  Gastrointestinal: Negative for heartburn, nausea and vomiting.  Genitourinary: Negative for dysuria, frequency and urgency.  Musculoskeletal: Positive for joint pain.  Neurological: Negative for dizziness, weakness and headaches.    Objective:  Physical Exam  Well nourished and well developed.  General: Alert and oriented x3, cooperative and pleasant, no acute distress.  Head: normocephalic, atraumatic, neck supple.  Eyes: EOMI.  Respiratory: breath sounds clear in all fields, no wheezing, rales, or rhonchi. Cardiovascular: Regular rate and rhythm, no murmurs, gallops or rubs.  Abdomen: non-tender to palpation and soft, normoactive bowel sounds. Musculoskeletal:  Left Knee Exam:  No effusion. Range of motion 10-110 with marked crepitus on range of motion. He is tender medial greater than lateral with no instability. There is a varus deformity.  Calves soft and nontender. Motor function intact in LE. Strength 5/5 LE bilaterally. Neuro: Distal pulses 2+. Sensation to light touch intact in LE.  Vital signs in last 24 hours: Blood pressure: 154/82 mmHg Pulse: 64 bpm  Labs:   Estimated body mass index is 28.89 kg/m as calculated from the following:   Height as of 02/24/19: 6' (1.829 m).   Weight as of 02/24/19: 96.6 kg.   Imaging Review Plain radiographs demonstrate severe degenerative joint  disease of the left knee(s). The overall alignment ismild varus. The bone quality appears to be adequate for age and reported activity level.      Assessment/Plan:  End stage arthritis, left knee   The patient history, physical examination, clinical judgment of the provider and imaging studies are consistent with end stage degenerative joint disease of the left knee(s) and total knee arthroplasty is deemed medically necessary. The treatment options including medical management, injection therapy arthroscopy and arthroplasty were discussed at length. The risks and benefits of total knee arthroplasty were presented and reviewed. The risks due to aseptic loosening, infection, stiffness, patella tracking problems, thromboembolic complications and other imponderables were discussed. The patient acknowledged the explanation, agreed to proceed with the plan and consent was signed. Patient is being admitted for inpatient treatment for surgery, pain control, PT, OT, prophylactic antibiotics, VTE prophylaxis, progressive ambulation and ADL's and discharge planning. The patient is planning to be discharged home.  Anticipated LOS equal to or greater than 2 midnights due to - Age 81 and older with one or more of the following:  - Obesity  - Expected need for hospital services (PT, OT, Nursing) required for safe  discharge  - Anticipated need for postoperative skilled nursing care or inpatient rehab  - Active co-morbidities: Diabetes and Respiratory Failure/COPD OR   - Unanticipated findings during/Post Surgery: None  - Patient is a high risk of re-admission due to: None  Therapy Plans: HHPT then outpatient therapy at [facility] Disposition: Home with wife and daughter Planned DVT Prophylaxis: aspirin 325mg  BID DME needed: None PCP: Dr. Jeannetta NapElkins TXA: IV Allergies: NKDA  Anesthesia Concerns: None BMI: 25.8 Last HgbA1c: Unsure. Will add to pre-op labs.  - Patient was instructed on what medications  to stop prior to surgery. - Follow-up visit in 2 weeks with Dr. Lequita HaltAluisio - Begin physical therapy following surgery - Pre-operative lab work as pre-surgical testing - Prescriptions will be provided in hospital at time of discharge  Arther AbbottKristie Brissa Asante, PA-C Orthopedic Surgery [facility] [place]

## 2019-05-26 NOTE — Patient Instructions (Addendum)
YOU ARE REQUIRED TO BE TESTED FOR COVID-19 PRIOR TO YOUR SURGERY . YOUR TEST MUST BE COMPLETED ON Thursday June 18th. TESTING IS LOCATED AT [facility]TR[facility] FROM 9:00AM - 3:00PM. FAILURE TO COMPLETE TESTING MAY RESULT IN CANCELLATION OF YOUR SURGERY.  ONCE YOUR COVID TEST IS COMPLETED, PLEASE BEGIN THE QUARANTINE INSTRUCTIONS AS OUTLINED IN YOUR HANDOUT.                Joshua [name]    Your procedure is scheduled on: 06-01-2019    Report to [facility] Main  Entrance    Report to admitting at 7:55 AM      Call this number if you have problems the morning of surgery [phone]    Remember: . BRUSH YOUR TEETH MORNING OF SURGERY AND RINSE YOUR MOUTH OUT, NO CHEWING GUM CANDY OR MINTS.  Do not eat food After Midnight. YOU MAY HAVE CLEAR LIQUIDS FROM MIDNIGHT UNTIL 4:30AM. At 4:30AM Please finish the prescribed Pre-Surgery Gatorade drink. Nothing by mouth after you finish the Gatorade drink !    CLEAR LIQUID DIET   Foods Allowed                                                                     Foods Excluded  Coffee and tea, regular and decaf                             liquids that you cannot  Plain Jell-O in any flavor                                             see through such as: Fruit ices (not with fruit pulp)                                     milk, soups, orange juice  Iced Popsicles                                    All solid food Carbonated beverages, regular and diet                                    Cranberry, grape and apple juices Sports drinks like Gatorade Lightly seasoned clear broth or consume(fat free) Sugar, honey syrup  Sample Menu Breakfast                                Lunch                                     Supper Cranberry juice                    Beef  broth                            Chicken broth Jell-O                                     Grape juice                           Apple juice Coffee or tea                         Jell-O                                      Popsicle                                                Coffee or tea                        Coffee or tea  _____________________________________________________________________    Take these medicines the morning of surgery with A SIP OF WATER: ALBUTEROL INHALER IF NEEDED AND BRING INHALER, LORATADINE (CLARITIN), METOPROLOL TARTRATE, AMLODIPINE (NORVASC)    DO NOT TAKE ANY DIABETIC MEDICATIONS DAY OF YOUR SURGERY     How to Manage Your Diabetes Before and After Surgery  Why is it important to control my blood sugar before and after surgery? . Improving blood sugar levels before and after surgery helps healing and can limit problems. . A way of improving blood sugar control is eating a healthy diet by: o  Eating less sugar and carbohydrates o  Increasing activity/exercise o  Talking with your doctor about reaching your blood sugar goals . High blood sugars (greater than 180 mg/dL) can raise your risk of infections and slow your recovery, so you will need to focus on controlling your diabetes during the weeks before surgery. . Make sure that the doctor who takes care of your diabetes knows about your planned surgery including the date and location.  How do I manage my blood sugar before surgery? . Check your blood sugar at least 4 times a day, starting 2 days before surgery, to make sure that the level is not too high or low. o Check your blood sugar the morning of your surgery when you wake up and every 2 hours until you get to the Short Stay unit. . If your blood sugar is less than 70 mg/dL, you will need to treat for low blood sugar: o Do not take insulin. o Treat a low blood sugar (less than 70 mg/dL) with  cup of clear juice (cranberry or apple), 4 glucose tablets, OR glucose gel. o Recheck blood sugar in 15 minutes after treatment (to make sure it is greater than 70 mg/dL). If your blood sugar is not greater than 70  mg/dL on recheck, call [phone] for further instructions. . Report your blood sugar to the short stay nurse when you get to Short Stay.  . If you are admitted to the hospital after surgery: o Your blood sugar will be checked by  the staff and you will probably be given insulin after surgery (instead of oral diabetes medicines) to make sure you have good blood sugar levels. o The goal for blood sugar control after surgery is 80-180 mg/dL.   WHAT DO I DO ABOUT MY DIABETES MEDICATION?  .  THE DAY BEFORE SURGERY TAKE MORNING DOSE OF GLIPIZIDE, TAKE ACTOS AS USUAL, TAKE NATEGLINIDE (STARLIK) AS USUAL .      Marland Kitchen. THE MORNING OF SURGERY DO NOT TAKE GLIPIZIDE, ACTOS OR NATEGLINIDE (STARLIK)    Reviewed and Endorsed by [facility] Patient Education Committee, August 2015                           You may not have any metal on your body including hair pins and              piercings  Do not wear jewelry, make-up, lotions, powders or perfumes, deodorant             Do not wear nail polish.  Do not shave  48 hours prior to surgery.              Men may shave face and neck.   Do not bring valuables to the hospital. [facility] IS NOT             RESPONSIBLE   FOR VALUABLES.  Contacts, dentures or bridgework may not be worn into surgery.  Leave suitcase in the car. After surgery it may be brought to your room.   ________________________________________________________________________________________________________________________________             [facility] - Preparing for Surgery Before surgery, you can play an important role.  Because skin is not sterile, your skin needs to be as free of germs as possible.  You can reduce the number of germs on your skin by washing with CHG (chlorahexidine gluconate) soap before surgery.  CHG is an antiseptic cleaner which kills germs and bonds with the skin to continue killing germs even after washing. Please DO NOT use if you have an allergy to CHG or  antibacterial soaps.  If your skin becomes reddened/irritated stop using the CHG and inform your nurse when you arrive at Short Stay. Do not shave (including legs and underarms) for at least 48 hours prior to the first CHG shower.  You may shave your face/neck. Please follow these instructions carefully:  1.  Shower with CHG Soap the night before surgery and the  morning of Surgery.  2.  If you choose to wash your hair, wash your hair first as usual with your  normal  shampoo.  3.  After you shampoo, rinse your hair and body thoroughly to remove the  shampoo.                           4.  Use CHG as you would any other liquid soap.  You can apply chg directly  to the skin and wash                       Gently with a scrungie or clean washcloth.  5.  Apply the CHG Soap to your body ONLY FROM THE NECK DOWN.   Do not use on face/ open  Wound or open sores. Avoid contact with eyes, ears mouth and genitals (private parts).                       Wash face,  Genitals (private parts) with your normal soap.             6.  Wash thoroughly, paying special attention to the area where your surgery  will be performed.  7.  Thoroughly rinse your body with warm water from the neck down.  8.  DO NOT shower/wash with your normal soap after using and rinsing off  the CHG Soap.                9.  Pat yourself dry with a clean towel.            10.  Wear clean pajamas.            11.  Place clean sheets on your bed the night of your first shower and do not  sleep with pets. Day of Surgery : Do not apply any lotions/deodorants the morning of surgery.  Please wear clean clothes to the hospital/surgery center.  FAILURE TO FOLLOW THESE INSTRUCTIONS MAY RESULT IN THE CANCELLATION OF YOUR SURGERY PATIENT SIGNATURE_________________________________  NURSE SIGNATURE__________________________________  ________________________________________________________________________   Joshua [name]  An incentive spirometer is a tool that can help keep your lungs clear and active. This tool measures how well you are filling your lungs with each breath. Taking long deep breaths may help reverse or decrease the chance of developing breathing (pulmonary) problems (especially infection) following:  A long period of time when you are unable to move or be active. BEFORE THE PROCEDURE   If the spirometer includes an indicator to show your best effort, your nurse or respiratory therapist will set it to a desired goal.  If possible, sit up straight or lean slightly forward. Try not to slouch.  Hold the incentive spirometer in an upright position. INSTRUCTIONS FOR USE  1. Sit on the edge of your bed if possible, or sit up as far as you can in bed or on a chair. 2. Hold the incentive spirometer in an upright position. 3. Breathe out normally. 4. Place the mouthpiece in your mouth and seal your lips tightly around it. 5. Breathe in slowly and as deeply as possible, raising the piston or the ball toward the top of the column. 6. Hold your breath for 3-5 seconds or for as long as possible. Allow the piston or ball to fall to the bottom of the column. 7. Remove the mouthpiece from your mouth and breathe out normally. 8. Rest for a few seconds and repeat Steps 1 through 7 at least 10 times every 1-2 hours when you are awake. Take your time and take a few normal breaths between deep breaths. 9. The spirometer may include an indicator to show your best effort. Use the indicator as a goal to work toward during each repetition. 10. After each set of 10 deep breaths, practice coughing to be sure your lungs are clear. If you have an incision (the cut made at the time of surgery), support your incision when coughing by placing a pillow or rolled up towels firmly against it. Once you are able to get out of bed, walk around indoors and cough well. You may stop using the incentive spirometer when  instructed by your caregiver.  RISKS AND COMPLICATIONS  Take your time so you do not  get dizzy or light-headed.  If you are in pain, you may need to take or ask for pain medication before doing incentive spirometry. It is harder to take a deep breath if you are having pain. AFTER USE  Rest and breathe slowly and easily.  It can be helpful to keep track of a log of your progress. Your caregiver can provide you with a simple table to help with this. If you are using the spirometer at home, follow these instructions: [place] IF:   You are having difficultly using the spirometer.  You have trouble using the spirometer as often as instructed.  Your pain medication is not giving enough relief while using the spirometer.  You develop fever of 100.5 F (38.1 C) or higher. SEEK IMMEDIATE MEDICAL CARE IF:   You cough up bloody sputum that had not been present before.  You develop fever of 102 F (38.9 C) or greater.  You develop worsening pain at or near the incision site. MAKE SURE YOU:   Understand these instructions.  Will watch your condition.  Will get help right away if you are not doing well or get worse. Document Released: 04/08/2007 Document Revised: 02/18/2012 Document Reviewed: 06/09/2007 ExitCare Patient Information 2014 ExitCare, [place].   ________________________________________________________________________  WHAT IS A BLOOD TRANSFUSION? Blood Transfusion Information  A transfusion is the replacement of blood or some of its parts. Blood is made up of multiple cells which provide different functions.  Red blood cells carry oxygen and are used for blood loss replacement.  White blood cells fight against infection.  Platelets control bleeding.  Plasma helps clot blood.  Other blood products are available for specialized needs, such as hemophilia or other clotting disorders. BEFORE THE TRANSFUSION  Who gives blood for transfusions?   Healthy  volunteers who are fully evaluated to make sure their blood is safe. This is blood bank blood. Transfusion therapy is the safest it has ever been in the practice of medicine. Before blood is taken from a donor, a complete history is taken to make sure that person has no history of diseases nor engages in risky social behavior (examples are intravenous drug use or sexual activity with multiple partners). The donor's travel history is screened to minimize risk of transmitting infections, such as malaria. The donated blood is tested for signs of infectious diseases, such as HIV and hepatitis. The blood is then tested to be sure it is compatible with you in order to minimize the chance of a transfusion reaction. If you or a relative donates blood, this is often done in anticipation of surgery and is not appropriate for emergency situations. It takes many days to process the donated blood. RISKS AND COMPLICATIONS Although transfusion therapy is very safe and saves many lives, the main dangers of transfusion include:   Getting an infectious disease.  Developing a transfusion reaction. This is an allergic reaction to something in the blood you were given. Every precaution is taken to prevent this. The decision to have a blood transfusion has been considered carefully by your caregiver before blood is given. Blood is not given unless the benefits outweigh the risks. AFTER THE TRANSFUSION  Right after receiving a blood transfusion, you will usually feel much better and more energetic. This is especially true if your red blood cells have gotten low (anemic). The transfusion raises the level of the red blood cells which carry oxygen, and this usually causes an energy increase.  The nurse administering the transfusion will **Note De-Identified Weidmann Obfuscation** monitor you carefully for complications. HOME CARE INSTRUCTIONS  No special instructions are needed after a transfusion. You may find your energy is better. Speak with your caregiver about any  limitations on activity for underlying diseases you may have. SEEK MEDICAL CARE IF:   Your condition is not improving after your transfusion.  You develop redness or irritation at the intravenous (IV) site. SEEK IMMEDIATE MEDICAL CARE IF:  Any of the following symptoms occur over the next 12 hours:  Shaking chills.  You have a temperature by mouth above 102 F (38.9 C), not controlled by medicine.  Chest, back, or muscle pain.  People around you feel you are not acting correctly or are confused.  Shortness of breath or difficulty breathing.  Dizziness and fainting.  You get a rash or develop hives.  You have a decrease in urine output.  Your urine turns a dark color or changes to pink, red, or brown. Any of the following symptoms occur over the next 10 days:  You have a temperature by mouth above 102 F (38.9 C), not controlled by medicine.  Shortness of breath.  Weakness after normal activity.  The [place] part of the eye turns yellow (jaundice).  You have a decrease in the amount of urine or are urinating less often.  Your urine turns a dark color or changes to pink, red, or brown. Document Released: 11/23/2000 Document Revised: 02/18/2012 Document Reviewed: 07/12/2008 [facility] Patient Information 2014 [place], [place].  _______________________________________________________________________

## 2019-05-26 NOTE — Progress Notes (Signed)
EKG 02-24-19 Epic CHEST XRAY 07-25-18 EPIC

## 2019-05-27 ENCOUNTER — Encounter (HOSPITAL_COMMUNITY): Payer: Self-pay

## 2019-05-27 ENCOUNTER — Other Ambulatory Visit: Payer: Self-pay

## 2019-05-27 ENCOUNTER — Encounter (HOSPITAL_COMMUNITY)
Admission: RE | Admit: 2019-05-27 | Discharge: 2019-05-27 | Disposition: A | Discharge status: Home / Self Care | Payer: Medicare Other | Source: Ambulatory Visit | Attending: Orthopedic Surgery | Admitting: Orthopedic Surgery

## 2019-05-27 DIAGNOSIS — M1712 Unilateral primary osteoarthritis, left knee: Secondary | ICD-10-CM | POA: Insufficient documentation

## 2019-05-27 DIAGNOSIS — Z01812 Encounter for preprocedural laboratory examination: Secondary | ICD-10-CM | POA: Diagnosis not present

## 2019-05-27 LAB — COMPREHENSIVE METABOLIC PANEL
ALT: 20 U/L (ref 0–44)
AST: 16 U/L (ref 15–41)
Albumin: 4 g/dL (ref 3.5–5.0)
Alkaline Phosphatase: 70 U/L (ref 38–126)
Anion gap: 11 (ref 5–15)
BUN: 33 mg/dL — ABNORMAL HIGH (ref 8–23)
CO2: 20 mmol/L — ABNORMAL LOW (ref 22–32)
Calcium: 9.2 mg/dL (ref 8.9–10.3)
Chloride: 108 mmol/L (ref 98–111)
Creatinine, Ser: 1.33 mg/dL — ABNORMAL HIGH (ref 0.61–1.24)
GFR calc Af Amer: 58 mL/min — ABNORMAL LOW (ref >60)
GFR calc non Af Amer: 50 mL/min — ABNORMAL LOW (ref >60)
Glucose, Bld: 151 mg/dL — ABNORMAL HIGH (ref 70–99)
Potassium: 4.9 mmol/L (ref 3.5–5.1)
Sodium: 139 mmol/L (ref 135–145)
Total Bilirubin: 0.3 mg/dL (ref 0.3–1.2)
Total Protein: 7.4 g/dL (ref 6.5–8.1)

## 2019-05-27 LAB — CBC
HCT: 44.8 % (ref 39.0–52.0)
Hemoglobin: 14.6 g/dL (ref 13.0–17.0)
MCH: 32.4 pg (ref 26.0–34.0)
MCHC: 32.6 g/dL (ref 30.0–36.0)
MCV: 99.3 fL (ref 80.0–100.0)
Platelets: 140 10*3/uL — ABNORMAL LOW (ref 150–400)
RBC: 4.51 MIL/uL (ref 4.22–5.81)
RDW: 14.8 % (ref 11.5–15.5)
WBC: 8.6 10*3/uL (ref 4.0–10.5)
nRBC: 0 % (ref 0.0–0.2)

## 2019-05-27 LAB — SURGICAL PCR SCREEN
MRSA, PCR: NEGATIVE
Staphylococcus aureus: NEGATIVE

## 2019-05-27 LAB — PROTIME-INR
INR: 0.9 (ref 0.8–1.2)
Prothrombin Time: 11.9 seconds (ref 11.4–15.2)

## 2019-05-27 LAB — GLUCOSE, CAPILLARY: Glucose-Capillary: 148 mg/dL — ABNORMAL HIGH (ref 70–99)

## 2019-05-27 LAB — APTT: aPTT: 26 seconds (ref 24–36)

## 2019-05-27 NOTE — Progress Notes (Addendum)
Patient denies acute cardiac symptoms today      hgba1c 7.2% done on 04-01-2019 on chart; copy provided by patient   Cardiac clearance for Left TKA , Dr Tollie Eth. See scanned in "correspondence" in media tab in epic

## 2019-05-28 ENCOUNTER — Other Ambulatory Visit (HOSPITAL_COMMUNITY)
Admission: RE | Admit: 2019-05-28 | Discharge: 2019-05-28 | Disposition: A | Discharge status: Home / Self Care | Payer: Medicare Other | Source: Ambulatory Visit | Attending: Orthopedic Surgery | Admitting: Orthopedic Surgery

## 2019-05-28 DIAGNOSIS — Z01812 Encounter for preprocedural laboratory examination: Secondary | ICD-10-CM | POA: Diagnosis not present

## 2019-05-28 LAB — SARS CORONAVIRUS 2 (TAT 6-24 HRS): SARS Coronavirus 2: NEGATIVE

## 2019-05-29 NOTE — Anesthesia Preprocedure Evaluation (Addendum)
Anesthesia Evaluation  Patient identified by MRN, date of birth, ID band Patient awake    Reviewed: Allergy & Precautions, NPO status , Patient's Chart, lab work & pertinent test results, reviewed documented beta blocker date and time   Airway Mallampati: II  TM Distance: >3 FB Neck ROM: Full    Dental  (+) Missing   Pulmonary COPD, former smoker,    Pulmonary exam normal breath sounds clear to auscultation       Cardiovascular hypertension, Pt. on medications and Pt. on home beta blockers Normal cardiovascular exam Rhythm:Regular Rate:Normal  ECG: rate 62. Sinus rhythm with sinus arrhythmia with 1st degree A-V block Left axis deviation   Neuro/Psych  Headaches, negative psych ROS   GI/Hepatic negative GI ROS, Neg liver ROS,   Endo/Other  negative endocrine ROSdiabetes, Oral Hypoglycemic Agents  Renal/GU negative Renal ROS     Musculoskeletal  (+) Arthritis , Osteoarthritis,    Abdominal   Peds  Hematology HLD   Anesthesia Other Findings Left knee osteoarthritis  Reproductive/Obstetrics                            Anesthesia Physical Anesthesia Plan  ASA: III  Anesthesia Plan: Spinal and Regional   Post-op Pain Management:    Induction: Intravenous  PONV Risk Score and Plan: 1 and Ondansetron, Dexamethasone and Treatment may vary due to age or medical condition  Airway Management Planned: Simple Face Mask  Additional Equipment:   Intra-op Plan:   Post-operative Plan:   Informed Consent: I have reviewed the patients History and Physical, chart, labs and discussed the procedure including the risks, benefits and alternatives for the proposed anesthesia with the patient or authorized representative who has indicated his/her understanding and acceptance.     Dental advisory given  Plan Discussed with: CRNA  Anesthesia Plan Comments: (Reviewed PAT note 05/27/2019, Konrad Felix,  PA-C)       Anesthesia Quick Evaluation

## 2019-05-29 NOTE — Progress Notes (Signed)
Anesthesia Chart Review   Case: [id number] Date/Time: 06/01/19 1010   Procedure: LEFT TOTAL KNEE ARTHROPLASTY (Left Knee) - 50min   Anesthesia type: Choice   Pre-op diagnosis: left knee osteoarthritis   Location: [facility] ROOM 10 / [facility]   Surgeon: Ollen GrossAluisio, Frank, MD      DISCUSSION: 83 yo former smoker (120 pack years, quit 07/10/16) with h/o HTN, HLD, DM II (surgery previously cancelled due to A1C of 8, 12/08/18 A1C 7.6), COPD, hard of hearing in both ears scheduled for above surgery on 06/01/2019 with Dr. Lequita HaltAluisio.    Last A1C 7.2 on 04/01/2019, on chart.   Pt with h/o COPD, last seen by pulmonologist Dr. Levy Pupaobert Byrum on 08/01/18.  Linear basilar atelectasis vs scar on chest xray with some degree of restriction on spirometry.  Per note by Dr. Delton CoombesByrum, pt "is at low to moderate risk for any surgery due to his underlyng lung disease.  This includes increased risk for prolonged hospitalization, ventilation or even death.  We saw him in office on 08/01/18 and he is at baseline.  If the benefits outweigh these risks then there is no absolute contraindication to proceed."  Pt was last seen by cardiology on 10/18/17, Dr. Viann FishSpencer Tilley.  Pt with well controlled HTN.  He denies PND, lower extremity edema, chest pain, and can participate in activity without shortness of breath.  Physical activity limited by knee pain.  Underwent cardiac cath 13 years ago with normal findings.  Normal EKG per Dr. Donnie Ahoilley, he also reports echo from many years ago with normal ejection fraction.  Per Dr. Donnie Ahoilley pt cleared for surgery, "From a cardiovascular viewpoint may proceed with the planned knee replacement surgery.  No additional cardiovascular workup is necessary."  Pt can proceed with planned procedure barring acute status change.   VS: BP (!) 147/65 (BP Location: Left Arm)   Pulse (!) 55   Temp 36.5 C (Oral)   Resp 18   Ht 6' (1.829 m)   Wt 99 kg   SpO2 95%   BMI 29.60 kg/m   PROVIDERS: Kaleen MaskElkins, Wilson Oliver, MD is  PCP   Levy PupaByrum, Robert, MD is Pulmonologist   Viann Fishilley, Spencer, MD is Cardiologist  LABS: Labs reviewed: Acceptable for surgery. (all labs ordered are listed, but only abnormal results are displayed)  Labs Reviewed  CBC - Abnormal; Notable for the following components:      Result Value   Platelets 140 (*)    All other components within normal limits  COMPREHENSIVE METABOLIC PANEL - Abnormal; Notable for the following components:   CO2 20 (*)    Glucose, Bld 151 (*)    BUN 33 (*)    Creatinine, Ser 1.33 (*)    GFR calc non Af Amer 50 (*)    GFR calc Af Amer 58 (*)    All other components within normal limits  GLUCOSE, CAPILLARY - Abnormal; Notable for the following components:   Glucose-Capillary 148 (*)    All other components within normal limits  SURGICAL PCR SCREEN  APTT  PROTIME-INR  TYPE AND SCREEN     IMAGES:   EKG: 02/24/2019 Rate 62 bpm Sinus rhythm with sinus arrhythmia with 1st degree A-V block Left axis deviation Low voltage QRS Possible Inferior infarct Abnormal ECG No significant change since last tracing  CV:  Past Medical History:  Diagnosis Date  . Anemia    hx.  . Arthritis   . COPD (chronic obstructive pulmonary disease) (HCC)    reports at  one poin he had copd but he has stopped smoking and now doesnt have any trouble breathing or  need an inhaler   . Diabetes mellitus without complication ([place])    type 2  . Headache    none recently   . High cholesterol   . History of kidney stones   . HOH (hard of hearing)    both ears  . Hypertension   . Pneumonia    years ago    Past Surgical History:  Procedure Laterality Date  . ANKLE FRACTURE SURGERY Left    when 16-4- years old  . ANTERIOR CERVICAL DECOMP/DISCECTOMY FUSION N/A 07/11/2017   Procedure: Anterior Cervical Decompression/Discectomy Fusion  - Cervical three-four;  Surgeon: Kary Kos, MD;  Location: [facility];  Service: Neurosurgery;  Laterality: N/A; limited extension of neck  currently   . CATARACT EXTRACTION Bilateral   . FRACTURE SURGERY      fx. lower left leg at age 65-13  . TOTAL KNEE ARTHROPLASTY Right 11/11/2017   Procedure: RIGHT TOTAL KNEE ARTHROPLASTY;  Surgeon: Gaynelle Arabian, MD;  Location: [facility];  Service: Orthopedics;  Laterality: Right;    MEDICATIONS: . albuterol (PROAIR HFA) 108 (90 Base) MCG/ACT inhaler  . allopurinol (ZYLOPRIM) 300 MG tablet  . amLODipine (NORVASC) 10 MG tablet  . Aspirin-Acetaminophen-Caffeine (GOODY HEADACHE PO)  . Cholecalciferol (VITAMIN D3) 50 MCG (2000 UT) TABS  . glipiZIDE (GLUCOTROL XL) 10 MG 24 hr tablet  . lisinopril (PRINIVIL,ZESTRIL) 40 MG tablet  . loratadine (CLARITIN) 10 MG tablet  . methocarbamol (ROBAXIN) 500 MG tablet  . metoprolol tartrate (LOPRESSOR) 100 MG tablet  . nateglinide (STARLIX) 120 MG tablet  . oxymetazoline (AFRIN) 0.05 % nasal spray  . pioglitazone (ACTOS) 15 MG tablet  . simvastatin (ZOCOR) 20 MG tablet  . vitamin B-12 (CYANOCOBALAMIN) 1000 MCG tablet   No current facility-administered medications for this encounter.     Maia Plan [facility] Testing [phone] 05/29/19 2:24 PM

## 2019-05-31 MED ORDER — BUPIVACAINE LIPOSOME 1.3 % IJ SUSP
20.0000 mL | Freq: Once | INTRAMUSCULAR | Status: DC
  Filled 2019-05-31: qty 20

## 2019-06-01 ENCOUNTER — Ambulatory Visit (HOSPITAL_COMMUNITY): Payer: Medicare Other | Admitting: Certified Registered Nurse Anesthetist

## 2019-06-01 ENCOUNTER — Other Ambulatory Visit: Payer: Self-pay

## 2019-06-01 ENCOUNTER — Encounter (HOSPITAL_COMMUNITY): Payer: Self-pay | Admitting: Emergency Medicine

## 2019-06-01 ENCOUNTER — Observation Stay (HOSPITAL_COMMUNITY)
Admission: AD | Admit: 2019-06-01 | Discharge: 2019-06-03 | Disposition: A | Discharge status: Home / Self Care | Payer: Medicare Other | Attending: Orthopedic Surgery | Admitting: Orthopedic Surgery

## 2019-06-01 ENCOUNTER — Encounter (HOSPITAL_COMMUNITY)
Admission: AD | Disposition: A | Discharge status: Home / Self Care | Payer: Self-pay | Source: Home / Self Care | Attending: Orthopedic Surgery

## 2019-06-01 DIAGNOSIS — J449 Chronic obstructive pulmonary disease, unspecified: Secondary | ICD-10-CM | POA: Insufficient documentation

## 2019-06-01 DIAGNOSIS — E119 Type 2 diabetes mellitus without complications: Secondary | ICD-10-CM | POA: Insufficient documentation

## 2019-06-01 DIAGNOSIS — Z87442 Personal history of urinary calculi: Secondary | ICD-10-CM | POA: Diagnosis not present

## 2019-06-01 DIAGNOSIS — Z7984 Long term (current) use of oral hypoglycemic drugs: Secondary | ICD-10-CM | POA: Diagnosis not present

## 2019-06-01 DIAGNOSIS — M1710 Unilateral primary osteoarthritis, unspecified knee: Secondary | ICD-10-CM | POA: Diagnosis present

## 2019-06-01 DIAGNOSIS — Z7982 Long term (current) use of aspirin: Secondary | ICD-10-CM | POA: Insufficient documentation

## 2019-06-01 DIAGNOSIS — Z87891 Personal history of nicotine dependence: Secondary | ICD-10-CM | POA: Diagnosis not present

## 2019-06-01 DIAGNOSIS — M1712 Unilateral primary osteoarthritis, left knee: Principal | ICD-10-CM | POA: Insufficient documentation

## 2019-06-01 DIAGNOSIS — E669 Obesity, unspecified: Secondary | ICD-10-CM | POA: Insufficient documentation

## 2019-06-01 DIAGNOSIS — E7800 Pure hypercholesterolemia, unspecified: Secondary | ICD-10-CM | POA: Insufficient documentation

## 2019-06-01 DIAGNOSIS — I10 Essential (primary) hypertension: Secondary | ICD-10-CM | POA: Diagnosis not present

## 2019-06-01 DIAGNOSIS — Z79899 Other long term (current) drug therapy: Secondary | ICD-10-CM | POA: Diagnosis not present

## 2019-06-01 DIAGNOSIS — Z6829 Body mass index (BMI) 29.0-29.9, adult: Secondary | ICD-10-CM | POA: Diagnosis not present

## 2019-06-01 DIAGNOSIS — M179 Osteoarthritis of knee, unspecified: Secondary | ICD-10-CM | POA: Diagnosis present

## 2019-06-01 HISTORY — PX: TOTAL KNEE ARTHROPLASTY: SHX125

## 2019-06-01 LAB — GLUCOSE, CAPILLARY
Glucose-Capillary: 144 mg/dL — ABNORMAL HIGH (ref 70–99)
Glucose-Capillary: 261 mg/dL — ABNORMAL HIGH (ref 70–99)
Glucose-Capillary: 291 mg/dL — ABNORMAL HIGH (ref 70–99)

## 2019-06-01 LAB — BPAM RBC
Blood Product Expiration Date: 202007152359
Blood Product Expiration Date: 202007162359
Unit Type and Rh: 600
Unit Type and Rh: 600

## 2019-06-01 LAB — TYPE AND SCREEN
ABO/RH(D): A NEG
Antibody Screen: POSITIVE
Unit division: 0
Unit division: 0

## 2019-06-01 SURGERY — ARTHROPLASTY, KNEE, TOTAL
Anesthesia: Regional | Site: Knee | Laterality: Left

## 2019-06-01 MED ORDER — TRAMADOL HCL 50 MG PO TABS
50.0000 mg | ORAL_TABLET | Freq: Four times a day (QID) | ORAL | Status: DC | PRN
  Administered 2019-06-02 – 2019-06-03 (×3): 50 mg via ORAL
  Filled 2019-06-01 (×3): qty 1

## 2019-06-01 MED ORDER — BUPIVACAINE HCL (PF) 0.75 % IJ SOLN
INTRAMUSCULAR | Status: DC | PRN
  Administered 2019-06-01: 1.8 mL via INTRATHECAL

## 2019-06-01 MED ORDER — PROPOFOL 500 MG/50ML IV EMUL
INTRAVENOUS | Status: DC | PRN
  Administered 2019-06-01: 100 ug/kg/min via INTRAVENOUS

## 2019-06-01 MED ORDER — STERILE WATER FOR IRRIGATION IR SOLN
Status: DC | PRN
  Administered 2019-06-01: 2000 mL

## 2019-06-01 MED ORDER — PHENYLEPHRINE 40 MCG/ML (10ML) SYRINGE FOR IV PUSH (FOR BLOOD PRESSURE SUPPORT)
PREFILLED_SYRINGE | INTRAVENOUS | Status: DC | PRN
  Administered 2019-06-01: 80 ug via INTRAVENOUS

## 2019-06-01 MED ORDER — PHENYLEPHRINE HCL (PRESSORS) 10 MG/ML IV SOLN
INTRAVENOUS | Status: AC
  Filled 2019-06-01: qty 1

## 2019-06-01 MED ORDER — VITAMIN B-12 1000 MCG PO TABS
1000.0000 ug | ORAL_TABLET | Freq: Every day | ORAL | Status: DC
  Administered 2019-06-01 – 2019-06-02 (×2): 1000 ug via ORAL
  Filled 2019-06-01 (×2): qty 1

## 2019-06-01 MED ORDER — PHENYLEPHRINE 40 MCG/ML (10ML) SYRINGE FOR IV PUSH (FOR BLOOD PRESSURE SUPPORT)
PREFILLED_SYRINGE | INTRAVENOUS | Status: AC
  Filled 2019-06-01: qty 10

## 2019-06-01 MED ORDER — INSULIN ASPART 100 UNIT/ML ~~LOC~~ SOLN
0.0000 [IU] | Freq: Three times a day (TID) | SUBCUTANEOUS | Status: DC
  Administered 2019-06-01 – 2019-06-02 (×2): 8 [IU] via SUBCUTANEOUS
  Administered 2019-06-02: 3 [IU] via SUBCUTANEOUS
  Administered 2019-06-03: 5 [IU] via SUBCUTANEOUS

## 2019-06-01 MED ORDER — PROPOFOL 10 MG/ML IV BOLUS
INTRAVENOUS | Status: AC
  Filled 2019-06-01: qty 20

## 2019-06-01 MED ORDER — FENTANYL CITRATE (PF) 100 MCG/2ML IJ SOLN: 25.0000 ug | INTRAMUSCULAR | Status: DC | PRN

## 2019-06-01 MED ORDER — TRANEXAMIC ACID-NACL 1000-0.7 MG/100ML-% IV SOLN
1000.0000 mg | INTRAVENOUS | Status: AC
  Administered 2019-06-01: 1000 mg via INTRAVENOUS
  Filled 2019-06-01: qty 100

## 2019-06-01 MED ORDER — PHENOL 1.4 % MT LIQD
1.0000 | OROMUCOSAL | Status: DC | PRN
  Filled 2019-06-01: qty 177

## 2019-06-01 MED ORDER — LACTATED RINGERS IV SOLN
INTRAVENOUS | Status: DC
  Administered 2019-06-01: 09:00:00 via INTRAVENOUS

## 2019-06-01 MED ORDER — AMLODIPINE BESYLATE 10 MG PO TABS
10.0000 mg | ORAL_TABLET | Freq: Every day | ORAL | Status: DC
  Administered 2019-06-02 – 2019-06-03 (×2): 10 mg via ORAL
  Filled 2019-06-01 (×2): qty 1

## 2019-06-01 MED ORDER — SODIUM CHLORIDE 0.9 % IR SOLN
Status: DC | PRN
  Administered 2019-06-01: 1000 mL

## 2019-06-01 MED ORDER — EPHEDRINE SULFATE-NACL 50-0.9 MG/10ML-% IV SOSY
PREFILLED_SYRINGE | INTRAVENOUS | Status: DC | PRN
  Administered 2019-06-01 (×2): 5 mg via INTRAVENOUS

## 2019-06-01 MED ORDER — OXYCODONE HCL 5 MG PO TABS
5.0000 mg | ORAL_TABLET | ORAL | Status: DC | PRN
  Administered 2019-06-01: 5 mg via ORAL
  Filled 2019-06-01: qty 1

## 2019-06-01 MED ORDER — ONDANSETRON HCL 4 MG/2ML IJ SOLN
INTRAMUSCULAR | Status: AC
  Filled 2019-06-01: qty 2

## 2019-06-01 MED ORDER — EPHEDRINE 5 MG/ML INJ
INTRAVENOUS | Status: AC
  Filled 2019-06-01: qty 10

## 2019-06-01 MED ORDER — PROPOFOL 10 MG/ML IV BOLUS
INTRAVENOUS | Status: AC
  Filled 2019-06-01: qty 40

## 2019-06-01 MED ORDER — POVIDONE-IODINE 10 % EX SWAB
2.0000 "application " | Freq: Once | CUTANEOUS | Status: AC
  Administered 2019-06-01: 2 via TOPICAL

## 2019-06-01 MED ORDER — BISACODYL 10 MG RE SUPP: 10.0000 mg | Freq: Every day | RECTAL | Status: DC | PRN

## 2019-06-01 MED ORDER — PROPOFOL 10 MG/ML IV BOLUS
INTRAVENOUS | Status: AC
  Filled 2019-06-01: qty 60

## 2019-06-01 MED ORDER — SODIUM CHLORIDE (PF) 0.9 % IJ SOLN
INTRAMUSCULAR | Status: DC | PRN
  Administered 2019-06-01: 60 mL

## 2019-06-01 MED ORDER — METOCLOPRAMIDE HCL 5 MG/ML IJ SOLN: 5.0000 mg | Freq: Three times a day (TID) | INTRAMUSCULAR | Status: DC | PRN

## 2019-06-01 MED ORDER — GLIPIZIDE ER 5 MG PO TB24
20.0000 mg | ORAL_TABLET | Freq: Every day | ORAL | Status: DC
  Administered 2019-06-02 – 2019-06-03 (×2): 20 mg via ORAL
  Filled 2019-06-01 (×2): qty 4

## 2019-06-01 MED ORDER — PROPOFOL 10 MG/ML IV BOLUS
INTRAVENOUS | Status: DC | PRN
  Administered 2019-06-01: 20 mg via INTRAVENOUS

## 2019-06-01 MED ORDER — METOPROLOL TARTRATE 50 MG PO TABS
100.0000 mg | ORAL_TABLET | Freq: Every day | ORAL | Status: DC
  Administered 2019-06-02 – 2019-06-03 (×2): 100 mg via ORAL
  Filled 2019-06-01 (×2): qty 2

## 2019-06-01 MED ORDER — ASPIRIN EC 325 MG PO TBEC
325.0000 mg | DELAYED_RELEASE_TABLET | Freq: Two times a day (BID) | ORAL | Status: DC
  Administered 2019-06-02 – 2019-06-03 (×3): 325 mg via ORAL
  Filled 2019-06-01 (×3): qty 1

## 2019-06-01 MED ORDER — SODIUM CHLORIDE (PF) 0.9 % IJ SOLN
INTRAMUSCULAR | Status: AC
  Filled 2019-06-01: qty 10

## 2019-06-01 MED ORDER — FENTANYL CITRATE (PF) 100 MCG/2ML IJ SOLN
50.0000 ug | INTRAMUSCULAR | Status: DC
  Administered 2019-06-01: 50 ug via INTRAVENOUS
  Filled 2019-06-01: qty 2

## 2019-06-01 MED ORDER — MIDAZOLAM HCL 2 MG/2ML IJ SOLN
1.0000 mg | INTRAMUSCULAR | Status: DC
  Filled 2019-06-01: qty 2

## 2019-06-01 MED ORDER — CEFAZOLIN SODIUM-DEXTROSE 2-4 GM/100ML-% IV SOLN
2.0000 g | Freq: Four times a day (QID) | INTRAVENOUS | Status: AC
  Administered 2019-06-01 (×2): 2 g via INTRAVENOUS
  Filled 2019-06-01 (×2): qty 100

## 2019-06-01 MED ORDER — METOCLOPRAMIDE HCL 5 MG PO TABS: 5.0000 mg | ORAL_TABLET | Freq: Three times a day (TID) | ORAL | Status: DC | PRN

## 2019-06-01 MED ORDER — SODIUM CHLORIDE (PF) 0.9 % IJ SOLN
INTRAMUSCULAR | Status: AC
  Filled 2019-06-01: qty 50

## 2019-06-01 MED ORDER — ALBUTEROL SULFATE (2.5 MG/3ML) 0.083% IN NEBU: 3.0000 mL | INHALATION_SOLUTION | RESPIRATORY_TRACT | Status: DC | PRN

## 2019-06-01 MED ORDER — BUPIVACAINE LIPOSOME 1.3 % IJ SUSP
INTRAMUSCULAR | Status: DC | PRN
  Administered 2019-06-01: 20 mL

## 2019-06-01 MED ORDER — DOCUSATE SODIUM 100 MG PO CAPS
100.0000 mg | ORAL_CAPSULE | Freq: Two times a day (BID) | ORAL | Status: DC
  Administered 2019-06-01 – 2019-06-03 (×4): 100 mg via ORAL
  Filled 2019-06-01 (×4): qty 1

## 2019-06-01 MED ORDER — 0.9 % SODIUM CHLORIDE (POUR BTL) OPTIME
TOPICAL | Status: DC | PRN
  Administered 2019-06-01: 1000 mL

## 2019-06-01 MED ORDER — ACETAMINOPHEN 10 MG/ML IV SOLN
INTRAVENOUS | Status: AC
  Filled 2019-06-01: qty 100

## 2019-06-01 MED ORDER — POLYETHYLENE GLYCOL 3350 17 G PO PACK: 17.0000 g | PACK | Freq: Every day | ORAL | Status: DC | PRN

## 2019-06-01 MED ORDER — MIDAZOLAM HCL 2 MG/2ML IJ SOLN
INTRAMUSCULAR | Status: AC
  Filled 2019-06-01: qty 2

## 2019-06-01 MED ORDER — NATEGLINIDE 120 MG PO TABS
120.0000 mg | ORAL_TABLET | Freq: Three times a day (TID) | ORAL | Status: DC
  Administered 2019-06-01 – 2019-06-03 (×6): 120 mg via ORAL
  Filled 2019-06-01 (×7): qty 1

## 2019-06-01 MED ORDER — FLEET ENEMA 7-19 GM/118ML RE ENEM: 1.0000 | ENEMA | Freq: Once | RECTAL | Status: DC | PRN

## 2019-06-01 MED ORDER — SODIUM CHLORIDE 0.9 % IV SOLN
INTRAVENOUS | Status: DC
  Administered 2019-06-01 – 2019-06-02 (×2): via INTRAVENOUS

## 2019-06-01 MED ORDER — ACETAMINOPHEN 500 MG PO TABS
1000.0000 mg | ORAL_TABLET | Freq: Four times a day (QID) | ORAL | Status: AC
  Administered 2019-06-01 – 2019-06-02 (×4): 1000 mg via ORAL
  Filled 2019-06-01 (×4): qty 2

## 2019-06-01 MED ORDER — DEXAMETHASONE SODIUM PHOSPHATE 10 MG/ML IJ SOLN
INTRAMUSCULAR | Status: AC
  Filled 2019-06-01: qty 1

## 2019-06-01 MED ORDER — MORPHINE SULFATE (PF) 2 MG/ML IV SOLN
1.0000 mg | INTRAVENOUS | Status: DC | PRN
  Administered 2019-06-01: 1 mg via INTRAVENOUS
  Filled 2019-06-01: qty 1

## 2019-06-01 MED ORDER — ONDANSETRON HCL 4 MG/2ML IJ SOLN: 4.0000 mg | Freq: Once | INTRAMUSCULAR | Status: DC | PRN

## 2019-06-01 MED ORDER — DEXAMETHASONE SODIUM PHOSPHATE 10 MG/ML IJ SOLN
10.0000 mg | Freq: Once | INTRAMUSCULAR | Status: AC
  Administered 2019-06-02: 10 mg via INTRAVENOUS
  Filled 2019-06-01: qty 1

## 2019-06-01 MED ORDER — ROPIVACAINE HCL 5 MG/ML IJ SOLN
INTRAMUSCULAR | Status: DC | PRN
  Administered 2019-06-01: 25 mL via PERINEURAL

## 2019-06-01 MED ORDER — DEXAMETHASONE SODIUM PHOSPHATE 10 MG/ML IJ SOLN
8.0000 mg | Freq: Once | INTRAMUSCULAR | Status: AC
  Administered 2019-06-01: 8 mg via INTRAVENOUS

## 2019-06-01 MED ORDER — ALLOPURINOL 300 MG PO TABS
300.0000 mg | ORAL_TABLET | Freq: Every day | ORAL | Status: DC
  Administered 2019-06-01 – 2019-06-03 (×3): 300 mg via ORAL
  Filled 2019-06-01 (×3): qty 1

## 2019-06-01 MED ORDER — SIMVASTATIN 20 MG PO TABS
20.0000 mg | ORAL_TABLET | Freq: Every evening | ORAL | Status: DC
  Administered 2019-06-01 – 2019-06-02 (×2): 20 mg via ORAL
  Filled 2019-06-01 (×2): qty 1

## 2019-06-01 MED ORDER — METHOCARBAMOL 500 MG PO TABS
500.0000 mg | ORAL_TABLET | Freq: Four times a day (QID) | ORAL | Status: DC | PRN
  Administered 2019-06-01 – 2019-06-03 (×3): 500 mg via ORAL
  Filled 2019-06-01 (×4): qty 1

## 2019-06-01 MED ORDER — PIOGLITAZONE HCL 15 MG PO TABS
15.0000 mg | ORAL_TABLET | Freq: Every day | ORAL | Status: DC
  Administered 2019-06-02 – 2019-06-03 (×2): 15 mg via ORAL
  Filled 2019-06-01 (×2): qty 1

## 2019-06-01 MED ORDER — MENTHOL 3 MG MT LOZG: 1.0000 | LOZENGE | OROMUCOSAL | Status: DC | PRN

## 2019-06-01 MED ORDER — ONDANSETRON HCL 4 MG/2ML IJ SOLN
INTRAMUSCULAR | Status: DC | PRN
  Administered 2019-06-01: 4 mg via INTRAVENOUS

## 2019-06-01 MED ORDER — METHOCARBAMOL 500 MG IVPB - SIMPLE MED
500.0000 mg | Freq: Four times a day (QID) | INTRAVENOUS | Status: DC | PRN
  Filled 2019-06-01: qty 50

## 2019-06-01 MED ORDER — CEFAZOLIN SODIUM-DEXTROSE 2-4 GM/100ML-% IV SOLN
2.0000 g | INTRAVENOUS | Status: AC
  Administered 2019-06-01: 2 g via INTRAVENOUS
  Filled 2019-06-01: qty 100

## 2019-06-01 MED ORDER — GABAPENTIN 100 MG PO CAPS
100.0000 mg | ORAL_CAPSULE | Freq: Three times a day (TID) | ORAL | Status: DC
  Administered 2019-06-01 – 2019-06-03 (×4): 100 mg via ORAL
  Filled 2019-06-01 (×5): qty 1

## 2019-06-01 MED ORDER — DIPHENHYDRAMINE HCL 12.5 MG/5ML PO ELIX: 12.5000 mg | ORAL_SOLUTION | ORAL | Status: DC | PRN

## 2019-06-01 MED ORDER — CHLORHEXIDINE GLUCONATE 4 % EX LIQD: 60.0000 mL | Freq: Once | CUTANEOUS | Status: DC

## 2019-06-01 MED ORDER — ONDANSETRON HCL 4 MG PO TABS: 4.0000 mg | ORAL_TABLET | Freq: Four times a day (QID) | ORAL | Status: DC | PRN

## 2019-06-01 MED ORDER — LORATADINE 10 MG PO TABS
10.0000 mg | ORAL_TABLET | Freq: Every day | ORAL | Status: DC
  Administered 2019-06-02 – 2019-06-03 (×2): 10 mg via ORAL
  Filled 2019-06-01 (×2): qty 1

## 2019-06-01 MED ORDER — SODIUM CHLORIDE 0.9 % IV SOLN
INTRAVENOUS | Status: DC | PRN
  Administered 2019-06-01: 25 ug/min via INTRAVENOUS

## 2019-06-01 MED ORDER — ONDANSETRON HCL 4 MG/2ML IJ SOLN: 4.0000 mg | Freq: Four times a day (QID) | INTRAMUSCULAR | Status: DC | PRN

## 2019-06-01 MED ORDER — ACETAMINOPHEN 10 MG/ML IV SOLN
INTRAVENOUS | Status: DC | PRN
  Administered 2019-06-01: 1000 mg via INTRAVENOUS

## 2019-06-01 SURGICAL SUPPLY — 58 items
BAG SPEC THK2 15X12 ZIP CLS (MISCELLANEOUS) ×1
BAG ZIPLOCK 12X15 (MISCELLANEOUS) ×2 IMPLANT
BANDAGE ACE 6X5 VEL STRL LF (GAUZE/BANDAGES/DRESSINGS) ×3 IMPLANT
BLADE SAG 18X100X1.27 (BLADE) ×2 IMPLANT
BLADE SAW SGTL 11.0X1.19X90.0M (BLADE) ×2 IMPLANT
BLADE SURG SZ10 CARB STEEL (BLADE) ×4 IMPLANT
BOWL SMART MIX CTS (DISPOSABLE) ×2 IMPLANT
CEMENT HV SMART SET (Cement) ×4 IMPLANT
CEMENT TIBIA MBT SIZE 5 (Knees) IMPLANT
CLSR STERI-STRIP ANTIMIC 1/2X4 (GAUZE/BANDAGES/DRESSINGS) ×1 IMPLANT
COVER SURGICAL LIGHT HANDLE (MISCELLANEOUS) ×2 IMPLANT
COVER WAND RF STERILE (DRAPES) IMPLANT
CUFF TOURN SGL QUICK 34 (TOURNIQUET CUFF) ×2
CUFF TRNQT CYL 34X4.125X (TOURNIQUET CUFF) ×1 IMPLANT
DECANTER SPIKE VIAL GLASS SM (MISCELLANEOUS) ×2 IMPLANT
DRAPE U-SHAPE 47X51 STRL (DRAPES) ×2 IMPLANT
DRSG ADAPTIC 3X8 NADH LF (GAUZE/BANDAGES/DRESSINGS) ×2 IMPLANT
DRSG PAD ABDOMINAL 8X10 ST (GAUZE/BANDAGES/DRESSINGS) ×2 IMPLANT
DURAPREP 26ML APPLICATOR (WOUND CARE) ×2 IMPLANT
ELECT REM PT RETURN 15FT ADLT (MISCELLANEOUS) ×2 IMPLANT
EVACUATOR 1/8 PVC DRAIN (DRAIN) ×2 IMPLANT
FEMUR SIGMA PS SZ 5.0 L (Femur) ×1 IMPLANT
GAUZE SPONGE 4X4 12PLY STRL (GAUZE/BANDAGES/DRESSINGS) ×2 IMPLANT
GLOVE BIO SURGEON STRL SZ7 (GLOVE) ×2 IMPLANT
GLOVE BIO SURGEON STRL SZ8 (GLOVE) ×2 IMPLANT
GLOVE BIOGEL PI IND STRL 6.5 (GLOVE) ×1 IMPLANT
GLOVE BIOGEL PI IND STRL 7.0 (GLOVE) ×1 IMPLANT
GLOVE BIOGEL PI IND STRL 8 (GLOVE) ×1 IMPLANT
GLOVE BIOGEL PI INDICATOR 6.5 (GLOVE) ×1
GLOVE BIOGEL PI INDICATOR 7.0 (GLOVE) ×1
GLOVE BIOGEL PI INDICATOR 8 (GLOVE) ×1
GLOVE SURG SS PI 6.5 STRL IVOR (GLOVE) ×2 IMPLANT
GOWN STRL REUS W/TWL LRG LVL3 (GOWN DISPOSABLE) ×6 IMPLANT
HANDPIECE INTERPULSE COAX TIP (DISPOSABLE) ×2
HOLDER FOLEY CATH W/STRAP (MISCELLANEOUS) IMPLANT
IMMOBILIZER KNEE 20 (SOFTGOODS) ×2
IMMOBILIZER KNEE 20 THIGH 36 (SOFTGOODS) ×1 IMPLANT
KIT TURNOVER KIT A (KITS) IMPLANT
MANIFOLD NEPTUNE II (INSTRUMENTS) ×2 IMPLANT
NS IRRIG 1000ML POUR BTL (IV SOLUTION) ×2 IMPLANT
PACK TOTAL KNEE CUSTOM (KITS) ×2 IMPLANT
PADDING CAST COTTON 6X4 STRL (CAST SUPPLIES) ×4 IMPLANT
PATELLA DOME PFC 41MM (Knees) ×1 IMPLANT
PIN STEINMAN FIXATION KNEE (PIN) ×1 IMPLANT
PLATE ROT INSERT 15MM SIZE 5 (Plate) ×1 IMPLANT
PROTECTOR NERVE ULNAR (MISCELLANEOUS) ×2 IMPLANT
SET HNDPC FAN SPRY TIP SCT (DISPOSABLE) ×1 IMPLANT
STRIP CLOSURE SKIN 1/2X4 (GAUZE/BANDAGES/DRESSINGS) ×4 IMPLANT
SUT MNCRL AB 4-0 PS2 18 (SUTURE) ×2 IMPLANT
SUT STRATAFIX 0 PDS 27 VIOLET (SUTURE) ×2
SUT VIC AB 2-0 CT1 27 (SUTURE) ×8
SUT VIC AB 2-0 CT1 TAPERPNT 27 (SUTURE) ×3 IMPLANT
SUTURE STRATFX 0 PDS 27 VIOLET (SUTURE) ×1 IMPLANT
TIBIA MBT CEMENT SIZE 5 (Knees) ×2 IMPLANT
TRAY FOLEY MTR SLVR 16FR STAT (SET/KITS/TRAYS/PACK) ×2 IMPLANT
WATER STERILE IRR 1000ML POUR (IV SOLUTION) ×4 IMPLANT
WRAP KNEE MAXI GEL POST OP (GAUZE/BANDAGES/DRESSINGS) ×2 IMPLANT
YANKAUER SUCT BULB TIP 10FT TU (MISCELLANEOUS) ×2 IMPLANT

## 2019-06-01 NOTE — Progress Notes (Signed)
Assisted Dr. Ellender with left, ultrasound guided, adductor canal block. Side rails up, monitors on throughout procedure. See vital signs in flow sheet. Tolerated Procedure well.  

## 2019-06-01 NOTE — Anesthesia Procedure Notes (Signed)
Anesthesia Regional Block: Adductor canal block   Pre-Anesthetic Checklist: ,, timeout performed, Correct Patient, Correct Site, Correct Laterality, Correct Procedure,, site marked, risks and benefits discussed, Surgical consent,  Pre-op evaluation,  At surgeon's request and post-op pain management  Laterality: Left  Prep: chloraprep       Needles:  Injection technique: Single-shot  Needle Type: Echogenic Stimulator Needle     Needle Length: 9cm  Needle Gauge: 21     Additional Needles:   Procedures:,,,, ultrasound used (permanent image in chart),,,,  Narrative:  Start time: 06/01/2019 9:50 AM End time: 06/01/2019 10:00 AM Injection made incrementally with aspirations every 5 mL.  Performed by: Personally  Anesthesiologist: Murvin Natal, MD  Additional Notes: Functioning IV was confirmed and monitors were applied. A time-out was performed. Hand hygiene and sterile gloves were used. The thigh was placed in a frog-leg position and prepped in a sterile fashion. A 65mm 21ga Arrow echogenic stimulator needle was placed using ultrasound guidance.  Negative aspiration and negative test dose prior to incremental administration of local anesthetic. The patient tolerated the procedure well.

## 2019-06-01 NOTE — Interval H&P Note (Signed)
History and Physical Interval Note:  06/01/2019 8:15 AM  Joshua [name]  has presented today for surgery, with the diagnosis of left knee osteoarthritis.  The various methods of treatment have been discussed with the patient and family. After consideration of risks, benefits and other options for treatment, the patient has consented to  Procedure(s) with comments: LEFT TOTAL KNEE ARTHROPLASTY (Left) - 33min as a surgical intervention.  The patient's history has been reviewed, patient examined, no change in status, stable for surgery.  I have reviewed the patient's chart and labs.  Questions were answered to the patient's satisfaction.     Pilar Plate Garima Chronis

## 2019-06-01 NOTE — Anesthesia Procedure Notes (Signed)
Spinal  Patient location during procedure: OR Start time: 06/01/2019 10:12 AM End time: 06/01/2019 10:15 AM Staffing Anesthesiologist: Murvin Natal, MD Resident/CRNA: Claudia Desanctis, CRNA Performed: resident/CRNA  Preanesthetic Checklist Completed: patient identified, site marked, surgical consent, pre-op evaluation, timeout performed, IV checked, risks and benefits discussed and monitors and equipment checked Spinal Block Patient position: sitting Prep: DuraPrep Patient monitoring: heart rate, cardiac monitor, continuous pulse ox and blood pressure Approach: midline Location: L3-4 Injection technique: single-shot Needle Needle type: Sprotte  Needle gauge: 24 G Needle length: 9 cm Needle insertion depth: 8 cm Assessment Sensory level: T4 Additional Notes SAB WITHOUT DIFFICULTY

## 2019-06-01 NOTE — Evaluation (Signed)
Physical Therapy Evaluation Patient Details Name: Joshua [name] MRN: [id number] DOB: [birth year] Today's Date: 06/01/2019   History of Present Illness  s/p L TKA PMHx: R TKA 2018, ACDF 2018, COPD  Clinical Impression  Pt is s/p TKA resulting in the deficits listed below (see PT Problem List).  Pt amb 6870' with RW and min/guard assist. Anticipate steady progress in acute setting.  Pt will benefit from skilled PT to increase their independence and safety with mobility to allow discharge to the venue listed below.     Follow Up Recommendations Follow surgeon's recommendation for DC plan and follow-up therapies    Equipment Recommendations  None recommended by PT    Recommendations for Other Services       Precautions / Restrictions Precautions Precautions: Fall;Knee Required Braces or Orthoses: Knee Immobilizer - Left Knee Immobilizer - Left: Discontinue once straight leg raise with < 10 degree lag Restrictions Weight Bearing Restrictions: No Other Position/Activity Restrictions: WBAT      Mobility  Bed Mobility Overal bed mobility: Needs Assistance Bed Mobility: Supine to Sit     Supine to sit: Min assist     General bed mobility comments: assist with LLE, incr time  Transfers Overall transfer level: Needs assistance Equipment used: Rolling walker (2 wheeled) Transfers: Sit to/from Stand Sit to Stand: Min assist         General transfer comment: assist to rise and transition to RW. cues for hand placement  Ambulation/Gait Ambulation/Gait assistance: Min guard Gait Distance (Feet): 70 Feet Assistive device: Rolling walker (2 wheeled) Gait Pattern/deviations: Step-to pattern;Decreased weight shift to left     General Gait Details: cues for sequence and RW distance  from self  Stairs            Wheelchair Mobility    Modified Rankin (Stroke Patients Only)       Balance                                             Pertinent  Vitals/Pain Pain Assessment: 0-10 Pain Score: 3  Pain Location: L knee Pain Descriptors / Indicators: Aching;Sore Pain Intervention(s): Premedicated before session;Repositioned;Monitored during session;Limited activity within patient's tolerance    Home Living Family/patient expects to be discharged to:: Private residence Living Arrangements: Spouse/significant other Available Help at Discharge: Available PRN/intermittently Type of Home: House Home Access: Stairs to enter   [employer] CorporationEntrance Stairs-Number of Steps: 3 Home Layout: One level Home Equipment: Walker - 2 wheels;Crutches      Prior Function Level of Independence: Independent;Needs assistance   Gait / Transfers Assistance Needed: reports independent gait  ADL's / Homemaking Assistance Needed: wife assists        Hand Dominance        Extremity/Trunk Assessment   Upper Extremity Assessment Upper Extremity Assessment: Overall WFL for tasks assessed    Lower Extremity Assessment Lower Extremity Assessment: LLE deficits/detail LLE Deficits / Details: ankle WFL. knee extension and hip flexion 2+/5 iwth 10* quad lag. AAROM ~6* to 65* knee flexion       Communication   Communication: HOH  Cognition Arousal/Alertness: Awake/alert Behavior During Therapy: WFL for tasks assessed/performed Overall Cognitive Status: Within Functional Limits for tasks assessed  General Comments      Exercises Total Joint Exercises Ankle Circles/Pumps: AROM;10 reps;Both Quad Sets: AROM;5 reps;Left   Assessment/Plan    PT Assessment Patient needs continued PT services  PT Problem List Decreased strength;Decreased range of motion;Decreased activity tolerance;Decreased mobility;Decreased knowledge of use of DME;Pain       PT Treatment Interventions DME instruction;Gait training;Functional mobility training;Therapeutic activities;Patient/family education;Therapeutic exercise;Stair  training    PT Goals (Current goals can be found in the Care Plan section)  Acute Rehab PT Goals PT Goal Formulation: With patient Time For Goal Achievement: 06/08/19 Potential to Achieve Goals: Good    Frequency 7X/week   Barriers to discharge        Co-evaluation               AM-PAC PT "6 Clicks" Mobility  Outcome Measure Help needed turning from your back to your side while in a flat bed without using bedrails?: A Little Help needed moving from lying on your back to sitting on the side of a flat bed without using bedrails?: A Little Help needed moving to and from a bed to a chair (including a wheelchair)?: A Little Help needed standing up from a chair using your arms (e.g., wheelchair or bedside chair)?: A Little   Help needed climbing 3-5 steps with a railing? : A Lot 6 Click Score: 14    End of Session Equipment Utilized During Treatment: Gait belt Activity Tolerance: Patient tolerated treatment well Patient left: with call bell/phone within reach;in chair;with chair alarm set   PT Visit Diagnosis: Difficulty in walking, not elsewhere classified (R26.2)    Time: 4315-4008 PT Time Calculation (min) (ACUTE ONLY): 24 min   Charges:   PT Evaluation $PT Eval Low Complexity: 1 Low PT Treatments $Gait Training: 8-22 mins        Kenyon Ana, PT  Pager: [phone] Acute Rehab Dept [facility]): [phone]   06/01/2019   [facility] 06/01/2019, 6:11 PM

## 2019-06-01 NOTE — Discharge Instructions (Signed)
° °Dr. Frank Aluisio °Total Joint Specialist °[facility] °[address] °[place], [place] [address] °[phone] ° °TOTAL KNEE REPLACEMENT POSTOPERATIVE DIRECTIONS ° °Knee Rehabilitation, Guidelines Following Surgery  °Results after knee surgery are often greatly improved when you follow the exercise, range of motion and muscle strengthening exercises prescribed by your doctor. Safety measures are also important to protect the knee from further injury. Any time any of these exercises cause you to have increased pain or swelling in your knee joint, decrease the amount until you are comfortable again and slowly increase them. If you have problems or questions, call your caregiver or physical therapist for advice.  ° °HOME CARE INSTRUCTIONS  °• Remove items at home which could result in a fall. This includes throw rugs or furniture in walking pathways.  °· ICE to the affected knee every three hours for 30 minutes at a time and then as needed for pain and swelling.  Continue to use ice on the knee for pain and swelling from surgery. You may notice swelling that will progress down to the foot and ankle.  This is normal after surgery.  Elevate the leg when you are not up walking on it.   °· Continue to use the breathing machine which will help keep your temperature down.  It is common for your temperature to cycle up and down following surgery, especially at night when you are not up moving around and exerting yourself.  The breathing machine keeps your lungs expanded and your temperature down. °· Do not place pillow under knee, focus on keeping the knee straight while resting ° °DIET °You may resume your previous home diet once your are discharged from the hospital. ° °DRESSING / WOUND CARE / SHOWERING °You may change your dressing 3-5 days after surgery.  Then change the dressing every day with sterile gauze.  Please use good hand washing techniques before changing the dressing.  Do not use any lotions  or creams on the incision until instructed by your surgeon. °You may start showering once you are discharged home but do not submerge the incision under water. Just pat the incision dry and apply a dry gauze dressing on daily. °Change the surgical dressing daily and reapply a dry dressing each time. ° °ACTIVITY °Walk with your walker as instructed. °Use walker as long as suggested by your caregivers. °Avoid periods of inactivity such as sitting longer than an hour when not asleep. This helps prevent blood clots.  °You may resume a sexual relationship in one month or when given the OK by your doctor.  °You may return to work once you are cleared by your doctor.  °Do not drive a car for 6 weeks or until released by you surgeon.  °Do not drive while taking narcotics. ° °WEIGHT BEARING °Weight bearing as tolerated with assist device (walker, cane, etc) as directed, use it as long as suggested by your surgeon or therapist, typically at least 4-6 weeks. ° °POSTOPERATIVE CONSTIPATION PROTOCOL °Constipation - defined medically as fewer than three stools per week and severe constipation as less than one stool per week. ° °One of the most common issues patients have following surgery is constipation.  Even if you have a regular bowel pattern at home, your normal regimen is likely to be disrupted due to multiple reasons following surgery.  Combination of anesthesia, postoperative narcotics, change in appetite and fluid intake all can affect your bowels.  In order to avoid complications following surgery, here are some   recommendations in order to help you during your recovery period. ° °Colace (docusate) - Pick up an over-the-counter form of Colace or another stool softener and take twice a day as long as you are requiring postoperative pain medications.  Take with a full glass of water daily.  If you experience loose stools or diarrhea, hold the colace until you stool forms back up.  If your symptoms do not get better within 1  week or if they get worse, check with your doctor. ° °Dulcolax (bisacodyl) - Pick up over-the-counter and take as directed by the product packaging as needed to assist with the movement of your bowels.  Take with a full glass of water.  Use this product as needed if not relieved by Colace only.  ° °MiraLax (polyethylene glycol) - Pick up over-the-counter to have on hand.  MiraLax is a solution that will increase the amount of water in your bowels to assist with bowel movements.  Take as directed and can mix with a glass of water, juice, soda, coffee, or tea.  Take if you go more than two days without a movement. °Do not use MiraLax more than once per day. Call your doctor if you are still constipated or irregular after using this medication for 7 days in a row. ° °If you continue to have problems with postoperative constipation, please contact the office for further assistance and recommendations.  If you experience "the worst abdominal pain ever" or develop nausea or vomiting, please contact the office immediatly for further recommendations for treatment. ° °ITCHING °If you experience itching with your medications, try taking only a single pain pill, or even half a pain pill at a time.  You can also use Benadryl over the counter for itching or also to help with sleep.  ° °TED HOSE STOCKINGS °Wear the elastic stockings on both legs for three weeks following surgery during the day but you may remove then at night for sleeping. ° °MEDICATIONS °See your medication summary on the “After Visit Summary” that the nursing staff will review with you prior to discharge.  You may have some home medications which will be placed on hold until you complete the course of blood thinner medication.  It is important for you to complete the blood thinner medication as prescribed by your surgeon.  Continue your approved medications as instructed at time of discharge. ° °PRECAUTIONS °If you experience chest pain or shortness of breath -  call 911 immediately for transfer to the hospital emergency department.  °If you develop a fever greater that 101 F, purulent drainage from wound, increased redness or drainage from wound, foul odor from the wound/dressing, or calf pain - CONTACT YOUR SURGEON.   °                                                °FOLLOW-UP APPOINTMENTS °Make sure you keep all of your appointments after your operation with your surgeon and caregivers. You should call the office at the above phone number and make an appointment for approximately two weeks after the date of your surgery or on the date instructed by your surgeon outlined in the "After Visit Summary". ° °RANGE OF MOTION AND STRENGTHENING EXERCISES  °Rehabilitation of the knee is important following a knee injury or an operation. After just a few days of immobilization, the muscles of   the thigh which control the knee become weakened and shrink (atrophy). Knee exercises are designed to build up the tone and strength of the thigh muscles and to improve knee motion. Often times heat used for twenty to thirty minutes before working out will loosen up your tissues and help with improving the range of motion but do not use heat for the first two weeks following surgery. These exercises can be done on a training (exercise) mat, on the floor, on a table or on a bed. Use what ever works the best and is most comfortable for you Knee exercises include:  °• Leg Lifts - While your knee is still immobilized in a splint or cast, you can do straight leg raises. Lift the leg to 60 degrees, hold for 3 sec, and slowly lower the leg. Repeat 10-20 times 2-3 times daily. Perform this exercise against resistance later as your knee gets better.  °• Quad and Hamstring Sets - Tighten up the muscle on the front of the thigh (Quad) and hold for 5-10 sec. Repeat this 10-20 times hourly. Hamstring sets are done by pushing the foot backward against an object and holding for 5-10 sec. Repeat as with quad  sets.  °· Leg Slides: Lying on your back, slowly slide your foot toward your buttocks, bending your knee up off the floor (only go as far as is comfortable). Then slowly slide your foot back down until your leg is flat on the floor again. °· Angel Wings: Lying on your back spread your legs to the side as far apart as you can without causing discomfort.  °A rehabilitation program following serious knee injuries can speed recovery and prevent re-injury in the future due to weakened muscles. Contact your doctor or a physical therapist for more information on knee rehabilitation.  ° °IF YOU ARE TRANSFERRED TO A SKILLED REHAB FACILITY °If the patient is transferred to a skilled rehab facility following release from the hospital, a list of the current medications will be sent to the facility for the patient to continue.  When discharged from the skilled rehab facility, please have the facility set up the patient's Home Health Physical Therapy prior to being released. Also, the skilled facility will be responsible for providing the patient with their medications at time of release from the facility to include their pain medication, the muscle relaxants, and their blood thinner medication. If the patient is still at the rehab facility at time of the two week follow up appointment, the skilled rehab facility will also need to assist the patient in arranging follow up appointment in our office and any transportation needs. ° °MAKE SURE YOU:  °• Understand these instructions.  °• Get help right away if you are not doing well or get worse.  ° ° °Pick up stool softner and laxative for home use following surgery while on pain medications. °Do not submerge incision under water. °Please use good hand washing techniques while changing dressing each day. °May shower starting three days after surgery. °Please use a clean towel to pat the incision dry following showers. °Continue to use ice for pain and swelling after surgery. °Do not  use any lotions or creams on the incision until instructed by your surgeon. ° °

## 2019-06-01 NOTE — Anesthesia Postprocedure Evaluation (Signed)
Anesthesia Post Note  Patient: Joshua [name]  Procedure(s) Performed: LEFT TOTAL KNEE ARTHROPLASTY (Left Knee)     Patient location during evaluation: PACU Anesthesia Type: Regional and Spinal Level of consciousness: awake and alert Pain management: pain level controlled Vital Signs Assessment: post-procedure vital signs reviewed and stable Respiratory status: spontaneous breathing, nonlabored ventilation, respiratory function stable and patient connected to nasal cannula oxygen Cardiovascular status: blood pressure returned to baseline and stable Postop Assessment: no apparent nausea or vomiting and spinal receding Anesthetic complications: no    Last Vitals:  Vitals:   06/01/19 1535 06/01/19 1641  BP: 140/75 (!) 157/80  Pulse: 75 72  Resp: 15 16  Temp:  36.7 C  SpO2: 94% 95%    Last Pain:  Vitals:   06/01/19 1452  TempSrc: Oral  PainSc:                  Ryan P Ellender

## 2019-06-01 NOTE — Progress Notes (Signed)
Ring successfully removed from pts left hand. Placed in belonging bag.

## 2019-06-01 NOTE — Op Note (Signed)
OPERATIVE REPORT-TOTAL KNEE ARTHROPLASTY   Pre-operative diagnosis- Osteoarthritis  Left knee(s)  Post-operative diagnosis- Osteoarthritis Left knee(s)  Procedure-  Left  Total Knee Arthroplasty  Surgeon- Dione Plover. Joshua Bekker, MD  Assistant- Joshua Jourdain, PA-C   Anesthesia-  Adductor canal block and spinal  EBL- 25 ml   Drains Hemovac  Tourniquet time- 45 minutes @ 637 mm Hg  Complications- None  Condition-PACU - hemodynamically stable.   Brief Clinical Note   Joshua [name] is a 83 y.o. year old male with end stage OA of his left knee with progressively worsening pain and dysfunction. He has constant pain, with activity and at rest and significant functional deficits with difficulties even with ADLs. He has had extensive non-op management including analgesics, injections of cortisone and viscosupplements, and home exercise program, but remains in significant pain with significant dysfunction. Radiographs show bone on bone arthritis all 3 compartments. He presents now for left Total Knee Arthroplasty.    Procedure in detail---   The patient is brought into the operating room and positioned supine on the operating table. After successful administration of  Adductor canal block and spinal,   a tourniquet is placed high on the  Left thigh(s) and the lower extremity is prepped and draped in the usual sterile fashion. Time out is performed by the operating team and then the  Left lower extremity is wrapped in Esmarch, knee flexed and the tourniquet inflated to 300 mmHg.       A midline incision is made with a ten blade through the subcutaneous tissue to the level of the extensor mechanism. A fresh blade is used to make a medial parapatellar arthrotomy. Soft tissue over the proximal medial tibia is subperiosteally elevated to the joint line with a knife and into the semimembranosus bursa with a Cobb elevator. Soft tissue over the proximal lateral tibia is elevated with attention being  paid to avoiding the patellar tendon on the tibial tubercle. The patella is everted, knee flexed 90 degrees and the ACL and PCL are removed. Findings are bone on bone all 3 compartments with massive global osteophytes        The drill is used to create a starting hole in the distal femur and the canal is thoroughly irrigated with sterile saline to remove the fatty contents. The 5 degree Left  valgus alignment guide is placed into the femoral canal and the distal femoral cutting block is pinned to remove 10 mm off the distal femur. Resection is made with an oscillating saw.      The tibia is subluxed forward and the menisci are removed. The extramedullary alignment guide is placed referencing proximally at the medial aspect of the tibial tubercle and distally along the second metatarsal axis and tibial crest. The block is pinned to remove 70mm off the more deficient medial  side. Resection is made with an oscillating saw. Size 5is the most appropriate size for the tibia and the proximal tibia is prepared with the modular drill and keel punch for that size.      The femoral sizing guide is placed and size 5 is most appropriate. Rotation is marked off the epicondylar axis and confirmed by creating a rectangular flexion gap at 90 degrees. The size 5 cutting block is pinned in this rotation and the anterior, posterior and chamfer cuts are made with the oscillating saw. The intercondylar block is then placed and that cut is made.      Trial size 5 tibial component, trial size  5 posterior stabilized femur and a 15  mm posterior stabilized rotating platform insert trial is placed. Full extension is achieved with excellent varus/valgus and anterior/posterior balance throughout full range of motion. The patella is everted and thickness measured to be 27  mm. Free hand resection is taken to 15 mm, a 41 template is placed, lug holes are drilled, trial patella is placed, and it tracks normally. Osteophytes are removed off the  posterior femur with the trial in place. All trials are removed and the cut bone surfaces prepared with pulsatile lavage. Cement is mixed and once ready for implantation, the size 5 tibial implant, size  5 posterior stabilized femoral component, and the size 41 patella are cemented in place and the patella is held with the clamp. The trial insert is placed and the knee held in full extension. The Exparel (20 ml mixed with 60 ml saline) is injected into the extensor mechanism, posterior capsule, medial and lateral gutters and subcutaneous tissues.  All extruded cement is removed and once the cement is hard the permanent 15 mm posterior stabilized rotating platform insert is placed into the tibial tray.      The wound is copiously irrigated with saline solution and the extensor mechanism closed over a hemovac drain with #1 V-loc suture. The tourniquet is released for a total tourniquet time of 45  minutes. Flexion against gravity is 140 degrees and the patella tracks normally. Subcutaneous tissue is closed with 2.0 vicryl and subcuticular with running 4.0 Monocryl. The incision is cleaned and dried and steri-strips and a bulky sterile dressing are applied. The limb is placed into a knee immobilizer and the patient is awakened and transported to recovery in stable condition.      Please note that a surgical assistant was a medical necessity for this procedure in order to perform it in a safe and expeditious manner. Surgical assistant was necessary to retract the ligaments and vital neurovascular structures to prevent injury to them and also necessary for proper positioning of the limb to allow for anatomic placement of the prosthesis.   Joshua RankinFrank V. Tayvin Preslar, MD    06/01/2019, 11:29 AM

## 2019-06-01 NOTE — Transfer of Care (Signed)
Immediate Anesthesia Transfer of Care Note  Patient: Joshua [name]  Procedure(s) Performed: LEFT TOTAL KNEE ARTHROPLASTY (Left Knee)  Patient Location: PACU  Anesthesia Type:Spinal and MAC combined with regional for post-op pain  Level of Consciousness: awake, alert  and patient cooperative  Airway & Oxygen Therapy: Patient Spontanous Breathing and Patient connected to face mask oxygen  Post-op Assessment: Report given to RN and Post -op Vital signs reviewed and stable  Post vital signs: Reviewed and stable  Last Vitals:  Vitals Value Taken Time  BP 104/51 06/01/19 1149  Temp    Pulse 56 06/01/19 1150  Resp 18 06/01/19 1150  SpO2 97 % 06/01/19 1150  Vitals shown include unvalidated device data.  Last Pain:  Vitals:   06/01/19 0956  TempSrc:   PainSc: 0-No pain      Patients Stated Pain Goal: 4 (43/56/86 1683)  Complications: No apparent anesthesia complications

## 2019-06-02 ENCOUNTER — Encounter (HOSPITAL_COMMUNITY): Payer: Self-pay | Admitting: Orthopedic Surgery

## 2019-06-02 DIAGNOSIS — M1712 Unilateral primary osteoarthritis, left knee: Secondary | ICD-10-CM | POA: Diagnosis not present

## 2019-06-02 LAB — BASIC METABOLIC PANEL
Anion gap: 8 (ref 5–15)
BUN: 34 mg/dL — ABNORMAL HIGH (ref 8–23)
CO2: 19 mmol/L — ABNORMAL LOW (ref 22–32)
Calcium: 8.7 mg/dL — ABNORMAL LOW (ref 8.9–10.3)
Chloride: 107 mmol/L (ref 98–111)
Creatinine, Ser: 1.21 mg/dL (ref 0.61–1.24)
GFR calc Af Amer: >60 mL/min (ref >60)
GFR calc non Af Amer: 56 mL/min — ABNORMAL LOW (ref >60)
Glucose, Bld: 220 mg/dL — ABNORMAL HIGH (ref 70–99)
Potassium: 5 mmol/L (ref 3.5–5.1)
Sodium: 134 mmol/L — ABNORMAL LOW (ref 135–145)

## 2019-06-02 LAB — CBC
HCT: 41 % (ref 39.0–52.0)
Hemoglobin: 12.9 g/dL — ABNORMAL LOW (ref 13.0–17.0)
MCH: 31.3 pg (ref 26.0–34.0)
MCHC: 31.5 g/dL (ref 30.0–36.0)
MCV: 99.5 fL (ref 80.0–100.0)
Platelets: 113 10*3/uL — ABNORMAL LOW (ref 150–400)
RBC: 4.12 MIL/uL — ABNORMAL LOW (ref 4.22–5.81)
RDW: 14.6 % (ref 11.5–15.5)
WBC: 14.2 10*3/uL — ABNORMAL HIGH (ref 4.0–10.5)
nRBC: 0 % (ref 0.0–0.2)

## 2019-06-02 LAB — GLUCOSE, CAPILLARY
Glucose-Capillary: 160 mg/dL — ABNORMAL HIGH (ref 70–99)
Glucose-Capillary: 272 mg/dL — ABNORMAL HIGH (ref 70–99)
Glucose-Capillary: 194 mg/dL — ABNORMAL HIGH (ref 70–99)
Glucose-Capillary: 164 mg/dL — ABNORMAL HIGH (ref 70–99)

## 2019-06-02 NOTE — Progress Notes (Signed)
Physical Therapy Treatment Patient Details Name: Ruthe MannanLarry D Burkhammer MRN: [id number] DOB: Oct 15, 1936 Today's Date: 06/02/2019    History of Present Illness s/p L TKA PMHx: R TKA 2018, ACDF 2018, COPD    PT Comments    POD # 1 pm session.  OOB by RN to amb to bathroom.  Assisted out of bathroom to amb in hallway.  Increased c/o fatigue this afternoon.  "Well I am 83", stated pt.  Assisted back to bed per pt request.  Pt plans to D/C to home tomorrow.  Progressing well.  Will see in am for stairs.   Follow Up Recommendations  Follow surgeon's recommendation for DC plan and follow-up therapies     Equipment Recommendations  None recommended by PT    Recommendations for Other Services       Precautions / Restrictions Precautions Precautions: Fall;Knee Precaution Comments: instructed on KI use for amb and stairs Required Braces or Orthoses: Knee Immobilizer - Left Knee Immobilizer - Left: Discontinue once straight leg raise with < 10 degree lag Restrictions Weight Bearing Restrictions: No Other Position/Activity Restrictions: WBAT    Mobility  Bed Mobility Overal bed mobility: Needs Assistance Bed Mobility: Supine to Sit     Supine to sit: Min assist     General bed mobility comments: demonstarted and instructed how to use belt to self assist.  Transfers Overall transfer level: Needs assistance Equipment used: Rolling walker (2 wheeled) Transfers: Sit to/from Stand Sit to Stand: Min assist;Min guard         General transfer comment: 25% Vc's on proper hand placement and safety with turns  Ambulation/Gait Ambulation/Gait assistance: Min guard;Min assist Gait Distance (Feet): 42 Feet Assistive device: Rolling walker (2 wheeled) Gait Pattern/deviations: Step-to pattern;Decreased weight shift to left Gait velocity: decreased   General Gait Details: 25% vc's on proper upright posture and sequencing   Stairs             Wheelchair Mobility    Modified  Rankin (Stroke Patients Only)       Balance                                            Cognition Arousal/Alertness: Awake/alert Behavior During Therapy: WFL for tasks assessed/performed Overall Cognitive Status: Within Functional Limits for tasks assessed                                        Exercises      General Comments        Pertinent Vitals/Pain Pain Assessment: 0-10 Pain Score: 6  Pain Location: L knee Pain Descriptors / Indicators: Aching;Sore;Operative site guarding Pain Intervention(s): Monitored during session;Premedicated before session;Repositioned;Ice applied    Home Living                      Prior Function            PT Goals (current goals can now be found in the care plan section) Progress towards PT goals: Progressing toward goals    Frequency    7X/week      PT Plan Current plan remains appropriate    Co-evaluation              AM-PAC PT "6 Clicks" Mobility   Outcome Measure  Help needed turning from your back to your side while in a flat bed without using bedrails?: A Little Help needed moving from lying on your back to sitting on the side of a flat bed without using bedrails?: A Little Help needed moving to and from a bed to a chair (including a wheelchair)?: A Little Help needed standing up from a chair using your arms (e.g., wheelchair or bedside chair)?: A Little Help needed to walk in hospital room?: A Little Help needed climbing 3-5 steps with a railing? : A Lot 6 Click Score: 17    End of Session Equipment Utilized During Treatment: Gait belt;Left knee immobilizer Activity Tolerance: Patient tolerated treatment well Patient left: with call bell/phone within reach;with chair alarm set;in bed   PT Visit Diagnosis: Difficulty in walking, not elsewhere classified (R26.2)     Time: 1525-1550 PT Time Calculation (min) (ACUTE ONLY): 25 min  Charges:  $Gait Training: 8-22  mins $Therapeutic Activity: 8-22 mins                     Rica Koyanagi  PTA Acute  Rehabilitation Services Pager      [phone] Office      [phone]

## 2019-06-02 NOTE — Progress Notes (Signed)
   Subjective: 1 Day Post-Op Procedure(s) (LRB): LEFT TOTAL KNEE ARTHROPLASTY (Left) Patient reports pain as moderate.   Patient seen in rounds by Dr. Wynelle Link. Patient is well, and has had no acute complaints or problems other than pain in the left knee. Denies chest pain, SOB, or calf pain. No issues overnight. Foley catheter removed this AM. We will continue therapy today.   Objective: Vital signs in last 24 hours: Temp:  [97.5 F (36.4 C)-98.1 F (36.7 C)] 97.7 F (36.5 C) (06/23 0511) Pulse Rate:  [56-84] 71 (06/23 0511) Resp:  [13-19] 18 (06/23 0511) BP: (104-180)/(49-80) 141/66 (06/23 0511) SpO2:  [94 %-99 %] 96 % (06/23 0511) Weight:  [99 kg] 99 kg (06/22 0834)  Intake/Output from previous day:  Intake/Output Summary (Last 24 hours) at 06/02/2019 0715 Last data filed at 06/02/2019 0700 Gross per 24 hour  Intake 3904.65 ml  Output 4480 ml  Net -575.35 ml    Labs: Recent Labs    06/02/19 0244  HGB 12.9*   Recent Labs    06/02/19 0244  WBC 14.2*  RBC 4.12*  HCT 41.0  PLT 113*   Recent Labs    06/02/19 0244  NA 134*  K 5.0  CL 107  CO2 19*  BUN 34*  CREATININE 1.21  GLUCOSE 220*  CALCIUM 8.7*   Exam: General - Patient is Alert and Oriented Extremity - Neurologically intact Neurovascular intact Sensation intact distally Dorsiflexion/Plantar flexion intact Dressing - dressing C/D/I Motor Function - intact, moving foot and toes well on exam.   Past Medical History:  Diagnosis Date  . Anemia    hx.  . Arthritis   . COPD (chronic obstructive pulmonary disease) ([place])    reports at one poin he had copd but he has stopped smoking and now doesnt have any trouble breathing or  need an inhaler   . Diabetes mellitus without complication ([place])    type 2  . Headache    Joshua [name] recently   . High cholesterol   . History of kidney stones   . HOH (hard of hearing)    both ears  . Hypertension   . Pneumonia    years ago    Assessment/Plan: 1 Day  Post-Op Procedure(s) (LRB): LEFT TOTAL KNEE ARTHROPLASTY (Left) Active Problems:   OA (osteoarthritis) of knee  Estimated body mass index is 29.6 kg/m as calculated from the following:   Height as of this encounter: 6' (1.829 m).   Weight as of this encounter: 99 kg. Advance diet Up with therapy  Anticipated LOS equal to or greater than 2 midnights due to - Age 47 and older with one or more of the following:  - Obesity  - Expected need for hospital services (PT, OT, Nursing) required for safe  discharge  - Anticipated need for postoperative skilled nursing care or inpatient rehab  - Active co-morbidities: Diabetes and Respiratory Failure/COPD OR   - Unanticipated findings during/Post Surgery: Joshua [name]  - Patient is a high risk of re-admission due to: Joshua [name]    DVT Prophylaxis - Aspirin Weight bearing as tolerated. D/C O2 and pulse ox and try on room air. Hemovac pulled without difficulty, will continue therapy today.  Plan is to go Home after hospital stay. Plan for discharge tomorrow pending progress with therapy due to medical comorbidities.   Joshua Duty, PA-C Orthopedic Surgery 06/02/2019, 7:15 AM

## 2019-06-02 NOTE — Progress Notes (Signed)
Physical Therapy Treatment Patient Details Name: Joshua [name] MRN: [id number] DOB: [birth year] Today's Date: 06/02/2019    History of Present Illness s/p L TKA PMHx: R TKA 2018, ACDF 2018, COPD    PT Comments    POD # 1 am session Applied KI. Instructed on KI use for amb and stairs for increased support.  Instructed to D/C when able to perform 10 active SLR.  Also educated on proper application and removal.  Assisted OOB.  General bed mobility comments: demonstarted and instructed how to use belt to self assist. General transfer comment: 25% Vc's on proper hand placement and safety with turns.  General Gait Details: 25% vc's on proper upright posture and sequencing. Assisted back to bed and performed some TKR TE's following HEP handout.  Instructed on proper tech, freq as well as use of ICE.    Follow Up Recommendations  Follow surgeon's recommendation for DC plan and follow-up therapies     Equipment Recommendations  None recommended by PT    Recommendations for Other Services       Precautions / Restrictions Precautions Precautions: Fall;Knee Precaution Comments: instructed on KI use for amb and stairs Required Braces or Orthoses: Knee Immobilizer - Left Knee Immobilizer - Left: Discontinue once straight leg raise with < 10 degree lag Restrictions Weight Bearing Restrictions: No Other Position/Activity Restrictions: WBAT    Mobility  Bed Mobility Overal bed mobility: Needs Assistance Bed Mobility: Supine to Sit     Supine to sit: Min assist     General bed mobility comments: demonstarted and instructed how to use belt to self assist.  Transfers Overall transfer level: Needs assistance Equipment used: Rolling walker (2 wheeled) Transfers: Sit to/from Stand Sit to Stand: Min assist;Min guard         General transfer comment: 25% Vc's on proper hand placement and safety with turns  Ambulation/Gait Ambulation/Gait assistance: Min guard;Min assist Gait  Distance (Feet): 42 Feet Assistive device: Rolling walker (2 wheeled) Gait Pattern/deviations: Step-to pattern;Decreased weight shift to left Gait velocity: decreased   General Gait Details: 25% vc's on proper upright posture and sequencing   Stairs             Wheelchair Mobility    Modified Rankin (Stroke Patients Only)       Balance                                            Cognition Arousal/Alertness: Awake/alert Behavior During Therapy: WFL for tasks assessed/performed Overall Cognitive Status: Within Functional Limits for tasks assessed                                        Exercises   Total Knee Replacement TE's 10 reps B LE ankle pumps 10 reps towel squeezes 10 reps knee presses 10 reps heel slides  10 reps SAQ's 10 reps SLR's 10 reps ABD Followed by ICE     General Comments        Pertinent Vitals/Pain Pain Assessment: 0-10 Pain Score: 6  Pain Location: L knee Pain Descriptors / Indicators: Aching;Sore;Operative site guarding Pain Intervention(s): Monitored during session;Premedicated before session;Repositioned;Ice applied    Home Living  Prior Function            PT Goals (current goals can now be found in the care plan section) Progress towards PT goals: Progressing toward goals    Frequency    7X/week      PT Plan Current plan remains appropriate    Co-evaluation              AM-PAC PT "6 Clicks" Mobility   Outcome Measure  Help needed turning from your back to your side while in a flat bed without using bedrails?: A Little Help needed moving from lying on your back to sitting on the side of a flat bed without using bedrails?: A Little Help needed moving to and from a bed to a chair (including a wheelchair)?: A Little Help needed standing up from a chair using your arms (e.g., wheelchair or bedside chair)?: A Little Help needed to walk in hospital  room?: A Little Help needed climbing 3-5 steps with a railing? : A Lot 6 Click Score: 17    End of Session Equipment Utilized During Treatment: Gait belt;Left knee immobilizer Activity Tolerance: Patient tolerated treatment well Patient left: with call bell/phone within reach;with chair alarm set;in bed   PT Visit Diagnosis: Difficulty in walking, not elsewhere classified (R26.2)     Time: 9604-54090915-0940 PT Time Calculation (min) (ACUTE ONLY): 25 min  Charges:  Felecia ShellingLori Fleetwood Pierron  PTA Acute  Rehabilitation Services Pager      [phone] Office      [phone]                   Felecia ShellingLori Jenisis Harmsen  PTA Acute  Rehabilitation Services Pager      [phone] Office      [phone]

## 2019-06-02 NOTE — Plan of Care (Signed)

## 2019-06-03 DIAGNOSIS — M1712 Unilateral primary osteoarthritis, left knee: Secondary | ICD-10-CM | POA: Diagnosis not present

## 2019-06-03 LAB — GLUCOSE, CAPILLARY
Glucose-Capillary: 98 mg/dL (ref 70–99)
Glucose-Capillary: 208 mg/dL — ABNORMAL HIGH (ref 70–99)

## 2019-06-03 LAB — CBC
HCT: 40.3 % (ref 39.0–52.0)
Hemoglobin: 13.1 g/dL (ref 13.0–17.0)
MCH: 32.3 pg (ref 26.0–34.0)
MCHC: 32.5 g/dL (ref 30.0–36.0)
MCV: 99.5 fL (ref 80.0–100.0)
Platelets: 112 10*3/uL — ABNORMAL LOW (ref 150–400)
RBC: 4.05 MIL/uL — ABNORMAL LOW (ref 4.22–5.81)
RDW: 14.8 % (ref 11.5–15.5)
WBC: 17.5 10*3/uL — ABNORMAL HIGH (ref 4.0–10.5)
nRBC: 0 % (ref 0.0–0.2)

## 2019-06-03 LAB — BASIC METABOLIC PANEL
Anion gap: 8 (ref 5–15)
BUN: 33 mg/dL — ABNORMAL HIGH (ref 8–23)
CO2: 21 mmol/L — ABNORMAL LOW (ref 22–32)
Calcium: 9.1 mg/dL (ref 8.9–10.3)
Chloride: 109 mmol/L (ref 98–111)
Creatinine, Ser: 1.17 mg/dL (ref 0.61–1.24)
GFR calc Af Amer: >60 mL/min (ref >60)
GFR calc non Af Amer: 58 mL/min — ABNORMAL LOW (ref >60)
Glucose, Bld: 115 mg/dL — ABNORMAL HIGH (ref 70–99)
Potassium: 5 mmol/L (ref 3.5–5.1)
Sodium: 138 mmol/L (ref 135–145)

## 2019-06-03 MED ORDER — METHOCARBAMOL 500 MG PO TABS: 500.0000 mg | ORAL_TABLET | Freq: Four times a day (QID) | ORAL | 0 refills | Status: DC | PRN

## 2019-06-03 MED ORDER — GABAPENTIN 100 MG PO CAPS: 100.0000 mg | ORAL_CAPSULE | Freq: Three times a day (TID) | ORAL | 0 refills | Status: DC

## 2019-06-03 MED ORDER — OXYCODONE HCL 5 MG PO TABS: 5.0000 mg | ORAL_TABLET | Freq: Four times a day (QID) | ORAL | 0 refills | Status: DC | PRN

## 2019-06-03 MED ORDER — ASPIRIN 325 MG PO TBEC: 325.0000 mg | DELAYED_RELEASE_TABLET | Freq: Two times a day (BID) | ORAL | 0 refills | Status: AC

## 2019-06-03 MED ORDER — TRAMADOL HCL 50 MG PO TABS: 50.0000 mg | ORAL_TABLET | Freq: Four times a day (QID) | ORAL | 0 refills | Status: DC | PRN

## 2019-06-03 NOTE — Plan of Care (Signed)
Plan of care reviewed and discussed with the patient. 

## 2019-06-03 NOTE — Care Management CC44 (Signed)
Condition Code 44 Documentation Completed  Patient Details  Name: Joshua [name] MRN: [id number] Date of Birth: 05-04-1936   Condition Code 44 given:  Yes Patient signature on Condition Code 44 notice:  Yes Documentation of 2 MD's agreement:  Yes Code 44 added to claim:  Yes    Leeroy Cha, RN 06/03/2019, 10:08 AM

## 2019-06-03 NOTE — Progress Notes (Signed)
Physical Therapy Treatment Patient Details Name: Joshua MannanLarry D [name] MRN: [id number] DOB: 1936/01/07 Today's Date: 06/03/2019    History of Present Illness s/p L TKA PMHx: R TKA 2018, ACDF 2018, COPD    PT Comments    POD # 2 am session Assisted OOB.  Demonstrated and instructed pt how to use belt to self assist LE as a leg lifter Assisted with amb a greater distance in hallway.  General Gait Details: 25% vc's on proper upright posture and sequencing.  Practiced stairs.  Number of Stairs: 3 General stair comments: 50% VC's on proper sequencing and safety using crutch   Follow Up Recommendations  Follow surgeon's recommendation for DC plan and follow-up therapies     Equipment Recommendations  None recommended by PT    Recommendations for Other Services       Precautions / Restrictions Precautions Precautions: Fall;Knee Precaution Comments: did not use KI today, pt able SLR Restrictions Weight Bearing Restrictions: No Other Position/Activity Restrictions: WBAT    Mobility  Bed Mobility Overal bed mobility: Needs Assistance Bed Mobility: Supine to Sit           General bed mobility comments: demonstarted and instructed how to use belt to self assist.  Transfers Overall transfer level: Needs assistance Equipment used: Rolling walker (2 wheeled) Transfers: Sit to/from Stand Sit to Stand: Supervision;Min guard         General transfer comment: 25% Vc's on proper hand placement and safety with turns  Ambulation/Gait Ambulation/Gait assistance: Supervision;Min guard Gait Distance (Feet): 32 Feet Assistive device: Rolling walker (2 wheeled) Gait Pattern/deviations: Step-to pattern;Decreased weight shift to left Gait velocity: decreased   General Gait Details: 25% vc's on proper upright posture and sequencing   Stairs Stairs: Yes Stairs assistance: Min assist Stair Management: One rail Left;Step to pattern;Forwards;With crutches Number of Stairs: 3 General  stair comments: 50% VC's on proper sequencing and safety using crutch   Wheelchair Mobility    Modified Rankin (Stroke Patients Only)       Balance                                            Cognition Arousal/Alertness: Awake/alert Behavior During Therapy: WFL for tasks assessed/performed Overall Cognitive Status: Within Functional Limits for tasks assessed                                 General Comments: HOH required repeat instructions      Exercises      General Comments        Pertinent Vitals/Pain      Home Living                      Prior Function            PT Goals (current goals can now be found in the care plan section) Progress towards PT goals: Progressing toward goals    Frequency    7X/week      PT Plan Current plan remains appropriate    Co-evaluation              AM-PAC PT "6 Clicks" Mobility   Outcome Measure  Help needed turning from your back to your side while in a flat bed without using bedrails?: A Little Help needed moving from lying  on your back to sitting on the side of a flat bed without using bedrails?: A Little Help needed moving to and from a bed to a chair (including a wheelchair)?: A Little Help needed standing up from a chair using your arms (e.g., wheelchair or bedside chair)?: A Little Help needed to walk in hospital room?: A Little Help needed climbing 3-5 steps with a railing? : A Little 6 Click Score: 18    End of Session Equipment Utilized During Treatment: Gait belt Activity Tolerance: Patient tolerated treatment well     PT Visit Diagnosis: Difficulty in walking, not elsewhere classified (R26.2)     Time: 4008-6761 PT Time Calculation (min) (ACUTE ONLY): 28 min  Charges:  $Gait Training: 8-22 mins $Therapeutic Activity: 8-22 mins                     Rica Koyanagi  PTA Acute  Rehabilitation Services Pager      [phone] Office       [phone]

## 2019-06-03 NOTE — Progress Notes (Signed)
   Subjective: 2 Days Post-Op Procedure(s) (LRB): LEFT TOTAL KNEE ARTHROPLASTY (Left) Patient reports pain as mild.   Patient seen in rounds with Dr. Wynelle Link. Patient is well, and has had no acute complaints or problems. States he is ready to go home. Voiding without difficulty and positive flatus. Denies chest pain, SOB, or calf pain. No issues overnight. Plan is to go Home after hospital stay.  Objective: Vital signs in last 24 hours: Temp:  [97.6 F (36.4 C)-98.1 F (36.7 C)] 98.1 F (36.7 C) (06/24 0554) Pulse Rate:  [65-80] 80 (06/24 0554) Resp:  [16-18] 18 (06/24 0554) BP: (137-156)/(67-84) 143/84 (06/24 0554) SpO2:  [90 %-94 %] 93 % (06/24 0554)  Intake/Output from previous day:  Intake/Output Summary (Last 24 hours) at 06/03/2019 0701 Last data filed at 06/03/2019 0700 Gross per 24 hour  Intake 960 ml  Output 2150 ml  Net -1190 ml    Labs: Recent Labs    06/02/19 0244 06/03/19 0256  HGB 12.9* 13.1   Recent Labs    06/02/19 0244 06/03/19 0256  WBC 14.2* 17.5*  RBC 4.12* 4.05*  HCT 41.0 40.3  PLT 113* 112*   Recent Labs    06/02/19 0244 06/03/19 0256  NA 134* 138  K 5.0 5.0  CL 107 109  CO2 19* 21*  BUN 34* 33*  CREATININE 1.21 1.17  GLUCOSE 220* 115*  CALCIUM 8.7* 9.1   Exam: General - Patient is Alert and Oriented Extremity - Neurologically intact Neurovascular intact Sensation intact distally Dorsiflexion/Plantar flexion intact Dressing/Incision - clean, dry, no drainage Motor Function - intact, moving foot and toes well on exam.   Past Medical History:  Diagnosis Date  . Anemia    hx.  . Arthritis   . COPD (chronic obstructive pulmonary disease) ([place])    reports at one poin he had copd but he has stopped smoking and now doesnt have any trouble breathing or  need an inhaler   . Diabetes mellitus without complication ([place])    type 2  . Headache    none recently   . High cholesterol   . History of kidney stones   . HOH (hard of  hearing)    both ears  . Hypertension   . Pneumonia    years ago    Assessment/Plan: 2 Days Post-Op Procedure(s) (LRB): LEFT TOTAL KNEE ARTHROPLASTY (Left) Active Problems:   OA (osteoarthritis) of knee  Estimated body mass index is 29.6 kg/m as calculated from the following:   Height as of this encounter: 6' (1.829 m).   Weight as of this encounter: 99 kg. Up with therapy D/C IV fluids  DVT Prophylaxis - Aspirin Weight-bearing as tolerated  Plan for discharge with HHPT after one session of physical therapy today. Will transition afterwards to outpatient PT at [facility]. Follow-up in 2 weeks in the office.   Theresa Duty, PA-C Orthopedic Surgery 06/03/2019, 7:01 AM

## 2019-06-03 NOTE — Progress Notes (Signed)
Physical Therapy Treatment Patient Details Name: Joshua [name] MRN: [id number] DOB: 1935/12/28 Today's Date: 06/03/2019    History of Present Illness s/p L TKA PMHx: R TKA 2018, ACDF 2018, COPD    PT Comments    POD # 2 pm session Pt needed another PT session to address stairs and called family "Otila Kluver" for familt education on how to best assist pt into house up stairs using one rail and one crutch.  Assisted pt back to bed. Addressed all mobility questions, discussed appropriate activity, educated on use of ICE.  Pt ready for D/C to home.   Follow Up Recommendations  Follow surgeon's recommendation for DC plan and follow-up therapies     Equipment Recommendations  None recommended by PT    Recommendations for Other Services       Precautions / Restrictions Precautions Precautions: Fall;Knee Precaution Comments: did not use KI today, pt able SLR Restrictions Weight Bearing Restrictions: No Other Position/Activity Restrictions: WBAT    Mobility  Bed Mobility Overal bed mobility: Needs Assistance Bed Mobility:  Sit to supine Min Assist            General bed mobility comments: assisted back to bed pt request to rest prior to going home  Transfers Overall transfer level: Needs assistance Equipment used: Rolling walker (2 wheeled) Transfers: Sit to/from Stand Sit to Stand: Supervision;Min guard         General transfer comment: 25% Vc's on proper hand placement and safety with turns  Ambulation/Gait Ambulation/Gait assistance: Supervision;Min guard Gait Distance (Feet): 12  Feet Assistive device: Rolling walker (2 wheeled) Gait Pattern/deviations: Step-to pattern;Decreased weight shift to left Gait velocity: decreased   General Gait Details: 25% vc's on proper upright posture and sequencing   Stairs Stairs: Yes Stairs assistance: Min assist Stair Management: One rail Left;Step to pattern;Forwards;With crutches Number of Stairs: 3 General stair  comments: 50% VC's on proper sequencing and safety using crutch   Wheelchair Mobility    Modified Rankin (Stroke Patients Only)       Balance                                            Cognition Arousal/Alertness: Awake/alert Behavior During Therapy: WFL for tasks assessed/performed Overall Cognitive Status: Within Functional Limits for tasks assessed                                 General Comments: HOH required repeat instructions       Prior Function            PT Goals (current goals can now be found in the care plan section) Progress towards PT goals: Progressing toward goals    Frequency    7X/week      PT Plan Current plan remains appropriate    Co-evaluation              AM-PAC PT "6 Clicks" Mobility   Outcome Measure  Help needed turning from your back to your side while in a flat bed without using bedrails?: A Little Help needed moving from lying on your back to sitting on the side of a flat bed without using bedrails?: A Little Help needed moving to and from a bed to a chair (including a wheelchair)?: A Little Help needed standing up from a chair  using your arms (e.g., wheelchair or bedside chair)?: A Little Help needed to walk in hospital room?: A Little Help needed climbing 3-5 steps with a railing? : A Little 6 Click Score: 18    End of Session Equipment Utilized During Treatment: Gait belt Activity Tolerance: Patient tolerated treatment well     PT Visit Diagnosis: Difficulty in walking, not elsewhere classified (R26.2)     Time: 4098-11911020-1044 PT Time Calculation (min) (ACUTE ONLY): 24 min  Charges:  $Gait Training: 8-22 mins $Self Care/Home Management: 8-22                     Felecia ShellingLori Isabel Freese  PTA Acute  Rehabilitation Services Pager      [phone] Office      [phone]

## 2019-06-03 NOTE — Care Management Obs Status (Signed)
[place] NOTIFICATION   Patient Details  Name: Joshua [name] MRN: [id number] Date of Birth: Aug 02, 1936   Medicare Observation Status Notification Given:  Yes    Leeroy Cha, RN 06/03/2019, 10:08 AM

## 2019-06-05 LAB — BPAM RBC
Blood Product Expiration Date: 202007152359
Blood Product Expiration Date: 202007162359
Unit Type and Rh: 600
Unit Type and Rh: 600

## 2019-06-05 LAB — TYPE AND SCREEN
ABO/RH(D): A NEG
Antibody Screen: POSITIVE
Unit division: 0
Unit division: 0

## 2019-06-08 NOTE — Discharge Summary (Signed)
Physician Discharge Summary   Patient ID: Joshua [name] MRN: [id number] DOB/AGE: 81-May-1937 21 y.o.  Admit date: 06/01/2019 Discharge date: 06/03/2019  Primary Diagnosis: Osteoarthritis, left knee   Admission Diagnoses:  Past Medical History:  Diagnosis Date  . Anemia    hx.  . Arthritis   . COPD (chronic obstructive pulmonary disease) (HCC)    reports at one poin he had copd but he has stopped smoking and now doesnt have any trouble breathing or  need an inhaler   . Diabetes mellitus without complication (HCC)    type 2  . Headache    none recently   . High cholesterol   . History of kidney stones   . HOH (hard of hearing)    both ears  . Hypertension   . Pneumonia    years ago   Discharge Diagnoses:   Active Problems:   OA (osteoarthritis) of knee  Estimated body mass index is 29.6 kg/m as calculated from the following:   Height as of this encounter: 6' (1.829 m).   Weight as of this encounter: 99 kg.  Procedure:  Procedure(s) (LRB): LEFT TOTAL KNEE ARTHROPLASTY (Left)   Consults: None  HPI: Joshua [name] is a 83 y.o. year old male with end stage OA of his left knee with progressively worsening pain and dysfunction. He has constant pain, with activity and at rest and significant functional deficits with difficulties even with ADLs. He has had extensive non-op management including analgesics, injections of cortisone and viscosupplements, and home exercise program, but remains in significant pain with significant dysfunction. Radiographs show bone on bone arthritis all 3 compartments. He presents now for left Total Knee Arthroplasty.    Laboratory Data: Admission on 06/01/2019, Discharged on 06/03/2019  Component Date Value Ref Range Status  . ABO/RH(D) 06/01/2019 A NEG   Final  . Antibody Screen 06/01/2019 POS   Final  . Sample Expiration 06/01/2019 06/04/2019,2359   Final  . Unit Number 06/01/2019 [id number]   Final  . Blood Component Type 06/01/2019 RED  CELLS,LR   Final  . Unit division 06/01/2019 00   Final  . Status of Unit 06/01/2019 REL FROM [facility]   Final  . Transfusion Status 06/01/2019 OK TO TRANSFUSE   Final  . Crossmatch Result 06/01/2019 COMPATIBLE   Final  . Unit Number 06/01/2019 [id number]   Final  . Blood Component Type 06/01/2019 RED CELLS,LR   Final  . Unit division 06/01/2019 00   Final  . Status of Unit 06/01/2019 REL FROM [facility]   Final  . Transfusion Status 06/01/2019 OK TO TRANSFUSE   Final  . Crossmatch Result 06/01/2019 COMPATIBLE   Final  . Glucose-Capillary 06/01/2019 144* 70 - 99 mg/dL Final  . Blood Product Unit Number 06/01/2019 [id number]   Final  . PRODUCT CODE 06/01/2019 [id number]   Final  . Unit Type and Rh 06/01/2019 0600   Final  . Blood Product Expiration Date 06/01/2019 [id number]   Final  . Blood Product Unit Number 06/01/2019 [id number]   Final  . PRODUCT CODE 06/01/2019 [id number]   Final  . Unit Type and Rh 06/01/2019 0600   Final  . Blood Product Expiration Date 06/01/2019 [id number]   Final  . Glucose-Capillary 06/01/2019 261* 70 - 99 mg/dL Final  . WBC 55/73/2202 14.2* 4.0 - 10.5 K/uL Final  . RBC 06/02/2019 4.12* 4.22 - 5.81 MIL/uL Final  . Hemoglobin 06/02/2019 12.9* 13.0 - 17.0 g/dL Final  . HCT 54/27/0623 41.0  39.0 - 52.0 %  Final  . MCV 06/02/2019 99.5  80.0 - 100.0 fL Final  . MCH 06/02/2019 31.3  26.0 - 34.0 pg Final  . MCHC 06/02/2019 31.5  30.0 - 36.0 g/dL Final  . RDW 04/54/098106/23/2020 14.6  11.5 - 15.5 % Final  . Platelets 06/02/2019 113* 150 - 400 K/uL Final   Comment: REPEATED TO VERIFY PLATELET COUNT CONFIRMED BY SMEAR SPECIMEN CHECKED FOR CLOTS Immature Platelet Fraction may be clinically indicated, consider ordering this additional test [id number]   . nRBC 06/02/2019 0.0  0.0 - 0.2 % Final   Performed at [facility], [address] [place], [place] [address]  . Sodium 06/02/2019 134* 135 - 145 mmol/L Final  . Potassium 06/02/2019 5.0  3.5 - 5.1  mmol/L Final  . Chloride 06/02/2019 107  98 - 111 mmol/L Final  . CO2 06/02/2019 19* 22 - 32 mmol/L Final  . Glucose, Bld 06/02/2019 220* 70 - 99 mg/dL Final  . BUN 08/65/784606/23/2020 34* 8 - 23 mg/dL Final  . Creatinine, Ser 06/02/2019 1.21  0.61 - 1.24 mg/dL Final  . Calcium 96/29/528406/23/2020 8.7* 8.9 - 10.3 mg/dL Final  . GFR calc non Af Amer 06/02/2019 56* >60 mL/min Final  . GFR calc Af Amer 06/02/2019 >60  >60 mL/min Final  . Anion gap 06/02/2019 8  5 - 15 Final   Performed at [facility], [address] [place], [place] [address]  . Glucose-Capillary 06/01/2019 291* 70 - 99 mg/dL Final  . Glucose-Capillary 06/02/2019 160* 70 - 99 mg/dL Final  . Glucose-Capillary 06/02/2019 272* 70 - 99 mg/dL Final  . Glucose-Capillary 06/02/2019 194* 70 - 99 mg/dL Final  . WBC 01/02/725306/24/2020 17.5* 4.0 - 10.5 K/uL Final  . RBC 06/03/2019 4.05* 4.22 - 5.81 MIL/uL Final  . Hemoglobin 06/03/2019 13.1  13.0 - 17.0 g/dL Final  . HCT 66/44/034706/24/2020 40.3  39.0 - 52.0 % Final  . MCV 06/03/2019 99.5  80.0 - 100.0 fL Final  . MCH 06/03/2019 32.3  26.0 - 34.0 pg Final  . MCHC 06/03/2019 32.5  30.0 - 36.0 g/dL Final  . RDW 42/59/563806/24/2020 14.8  11.5 - 15.5 % Final  . Platelets 06/03/2019 112* 150 - 400 K/uL Final   Comment: REPEATED TO VERIFY SPECIMEN CHECKED FOR CLOTS Immature Platelet Fraction may be clinically indicated, consider ordering this additional test [id number] CONSISTENT WITH PREVIOUS RESULT   . nRBC 06/03/2019 0.0  0.0 - 0.2 % Final   Performed at [facility], [address] [place], [place] [address]  . Sodium 06/03/2019 138  135 - 145 mmol/L Final  . Potassium 06/03/2019 5.0  3.5 - 5.1 mmol/L Final  . Chloride 06/03/2019 109  98 - 111 mmol/L Final  . CO2 06/03/2019 21* 22 - 32 mmol/L Final  . Glucose, Bld 06/03/2019 115* 70 - 99 mg/dL Final  . BUN 16/60/630106/24/2020 33* 8 - 23 mg/dL Final  . Creatinine, Ser 06/03/2019 1.17  0.61 - 1.24 mg/dL Final  . Calcium 60/10/932306/24/2020 9.1  8.9  - 10.3 mg/dL Final  . GFR calc non Af Amer 06/03/2019 58* >60 mL/min Final  . GFR calc Af Amer 06/03/2019 >60  >60 mL/min Final  . Anion gap 06/03/2019 8  5 - 15 Final   Performed at [facility], [address] [place], [place] [address]  . Glucose-Capillary 06/02/2019 164* 70 - 99 mg/dL Final  . Glucose-Capillary 06/03/2019 98  70 - 99 mg/dL Final  . Glucose-Capillary 06/03/2019 208*  70 - 99 mg/dL Final  Hospital Outpatient Visit on 05/28/2019  Component Date Value Ref Range Status  . SARS Coronavirus 2 05/28/2019 NEGATIVE  NEGATIVE Final   Comment: (NOTE) SARS-CoV-2 target nucleic acids are NOT DETECTED. The SARS-CoV-2 RNA is generally detectable in upper and lower respiratory specimens during the acute phase of infection. Negative results do not preclude SARS-CoV-2 infection, do not rule out co-infections with other pathogens, and should not be used as the sole basis for treatment or other patient management decisions. Negative results must be combined with clinical observations, patient history, and epidemiological information. The expected result is Negative. Fact Sheet for Patients: FlowerCheck.behttps://www.fda.gov/media/136695/download Fact Sheet for Healthcare Providers: https://www.smith-hall.com/https://www.fda.gov/media/136692/download This test is not yet approved or cleared by the [place] FDA and  has been authorized for detection and/or diagnosis of SARS-CoV-2 by FDA under an Emergency Use Authorization (EUA). This EUA will remain  in effect (meaning this test can be used) for the duration of the COVID-19 declaration under Section 56                          4(b)(1) of the Act, 21 U.S.C. section 360bbb-3(b)(1), unless the authorization is terminated or revoked sooner. Performed at [facility], [address] [place], [place] [address]   Hospital Outpatient Visit on 05/27/2019  Component Date Value Ref Range Status  . aPTT 05/27/2019 26  24 - 36 seconds Final    Performed at [facility], [address] [place], [place] [address]  . WBC 05/27/2019 8.6  4.0 - 10.5 K/uL Final   WHITE COUNT CONFIRMED ON SMEAR  . RBC 05/27/2019 4.51  4.22 - 5.81 MIL/uL Final  . Hemoglobin 05/27/2019 14.6  13.0 - 17.0 g/dL Final  . HCT 09/81/191406/17/2020 44.8  39.0 - 52.0 % Final  . MCV 05/27/2019 99.3  80.0 - 100.0 fL Final  . MCH 05/27/2019 32.4  26.0 - 34.0 pg Final  . MCHC 05/27/2019 32.6  30.0 - 36.0 g/dL Final  . RDW 78/29/562106/17/2020 14.8  11.5 - 15.5 % Final  . Platelets 05/27/2019 140* 150 - 400 K/uL Final   Comment: PLATELET COUNT CONFIRMED BY SMEAR SPECIMEN CHECKED FOR CLOTS Immature Platelet Fraction may be clinically indicated, consider ordering this additional test [id number]   . nRBC 05/27/2019 0.0  0.0 - 0.2 % Final   Performed at [facility], [address] [place], [place] [address]  . Sodium 05/27/2019 139  135 - 145 mmol/L Final  . Potassium 05/27/2019 4.9  3.5 - 5.1 mmol/L Final  . Chloride 05/27/2019 108  98 - 111 mmol/L Final  . CO2 05/27/2019 20* 22 - 32 mmol/L Final  . Glucose, Bld 05/27/2019 151* 70 - 99 mg/dL Final  . BUN 52/84/132406/17/2020 33* 8 - 23 mg/dL Final  . Creatinine, Ser 05/27/2019 1.33* 0.61 - 1.24 mg/dL Final  . Calcium 40/10/272506/17/2020 9.2  8.9 - 10.3 mg/dL Final  . Total Protein 05/27/2019 7.4  6.5 - 8.1 g/dL Final  . Albumin 36/64/403406/17/2020 4.0  3.5 - 5.0 g/dL Final  . AST 74/25/956306/17/2020 16  15 - 41 U/L Final  . ALT 05/27/2019 20  0 - 44 U/L Final  . Alkaline Phosphatase 05/27/2019 70  38 - 126 U/L Final  . Total Bilirubin 05/27/2019 0.3  0.3 - 1.2 mg/dL Final  . GFR calc non Af Amer 05/27/2019 50* >60 mL/min Final  . GFR calc Af Amer 05/27/2019 58* >60  mL/min Final  . Anion gap 05/27/2019 11  5 - 15 Final   Performed at [facility], [address], [place], [place] [address]  . Prothrombin Time 05/27/2019 11.9  11.4 - 15.2 seconds Final  . INR 05/27/2019 0.9  0.8 - 1.2 Final   Comment:  (NOTE) INR goal varies based on device and disease states. Performed at [facility], [address], [place], [place] [address]   . ABO/RH(D) 05/27/2019 A NEG   Final  . Antibody Screen 05/27/2019 POS   Final  . Sample Expiration 05/27/2019 05/31/2019,2359   Final  . Antibody Identification 05/27/2019 ANTI D   Final  . Extend sample reason 05/27/2019    Final                   Value:NO TRANSFUSIONS OR PREGNANCY IN THE PAST 3 MONTHS Performed at [facility], [address], [place], [place] [address]   . Unit Number 05/27/2019 [id number]   Final  . Blood Component Type 05/27/2019 RED CELLS,LR   Final  . Unit division 05/27/2019 00   Final  . Status of Unit 05/27/2019 REL FROM [facility]   Final  . Transfusion Status 05/27/2019 OK TO TRANSFUSE   Final  . Crossmatch Result 05/27/2019 COMPATIBLE   Final  . Unit Number 05/27/2019 [id number]   Final  . Blood Component Type 05/27/2019 RED CELLS,LR   Final  . Unit division 05/27/2019 00   Final  . Status of Unit 05/27/2019 REL FROM [facility]   Final  . Transfusion Status 05/27/2019 OK TO TRANSFUSE   Final  . Crossmatch Result 05/27/2019 COMPATIBLE   Final  . MRSA, PCR 05/27/2019 NEGATIVE  NEGATIVE Final  . Staphylococcus aureus 05/27/2019 NEGATIVE  NEGATIVE Final   Comment: (NOTE) The Xpert SA Assay (FDA approved for NASAL specimens in patients 24 years of age and older), is one component of a comprehensive surveillance program. It is not intended to diagnose infection nor to guide or monitor treatment. Performed at [facility], [address], [place], [place] [address]   . Glucose-Capillary 05/27/2019 148* 70 - 99 mg/dL Final  . Blood Product Unit Number 05/27/2019 [id number]   Final  . PRODUCT CODE 05/27/2019 [id number]   Final  . Unit Type and Rh 05/27/2019 0600   Final  . Blood Product Expiration Date 05/27/2019 [id number]   Final  . Blood Product Unit Number 05/27/2019  [id number]   Final  . PRODUCT CODE 05/27/2019 [id number]   Final  . Unit Type and Rh 05/27/2019 0600   Final  . Blood Product Expiration Date 05/27/2019 [id number]   Final     X-Rays:No results found.  EKG: Orders placed or performed during the hospital encounter of 02/24/19  . EKG 12 lead  . EKG 12 lead     Hospital Course: Joshua [name] is a 83 y.o. who was admitted to [facility]. They were brought to the operating room on 06/01/2019 and underwent Procedure(s): LEFT TOTAL KNEE ARTHROPLASTY.  Patient tolerated the procedure well and was later transferred to the recovery room and then to the orthopaedic floor for postoperative care. They were given PO and IV analgesics for pain control following their surgery. They were given 24 hours of postoperative antibiotics of  Anti-infectives (From admission, onward)   Start     Dose/Rate Route Frequency Ordered Stop   06/01/19 1630  ceFAZolin (ANCEF) IVPB 2g/100 mL premix     2  g 200 mL/hr over 30 Minutes Intravenous Every 6 hours 06/01/19 1333 06/01/19 2216   06/01/19 0815  ceFAZolin (ANCEF) IVPB 2g/100 mL premix     2 g 200 mL/hr over 30 Minutes Intravenous On call to O.R. 06/01/19 2683 06/01/19 1019     and started on DVT prophylaxis in the form of Aspirin.   PT and OT were ordered for total joint protocol. Discharge planning consulted to help with postop disposition and equipment needs. Patient had a good night on the evening of surgery. They started to get up OOB with therapy on POD #0. Hemovac drain was pulled without difficulty on day one. Continued to work with therapy into POD #2. Pt was seen during rounds on day two and was ready to go home pending progress with therapy. Dressing was changed and the incision was clean, dry, and intact with no drainage. Pt worked with therapy for two additional sessions and was meeting their goals. He was discharged to home later that day in stable condition.  Diet: Diabetic diet Activity:  WBAT Follow-up: in 2 weeks Disposition: Home with HHPT followed by outpatient PT at [facility] Discharged Condition: stable   Discharge Instructions    Call MD / Call 911   Complete by: As directed    If you experience chest pain or shortness of breath, CALL 911 and be transported to the hospital emergency room.  If you develope a fever above 101 F, pus (white drainage) or increased drainage or redness at the wound, or calf pain, call your surgeon's office.   Change dressing   Complete by: As directed    Change the dressing daily with sterile 4 x 4 inch gauze dressing and apply TED hose.   Constipation Prevention   Complete by: As directed    Drink plenty of fluids.  Prune juice may be helpful.  You may use a stool softener, such as Colace (over the counter) 100 mg twice a day.  Use MiraLax (over the counter) for constipation as needed.   Diet - low sodium heart healthy   Complete by: As directed    Discharge instructions   Complete by: As directed    Dr. Gaynelle Arabian Total Joint Specialist [facility] [address] [place], [place] [address] [phone]  TOTAL KNEE REPLACEMENT POSTOPERATIVE DIRECTIONS  Knee Rehabilitation, Guidelines Following Surgery  Results after knee surgery are often greatly improved when you follow the exercise, range of motion and muscle strengthening exercises prescribed by your doctor. Safety measures are also important to protect the knee from further injury. Any time any of these exercises cause you to have increased pain or swelling in your knee joint, decrease the amount until you are comfortable again and slowly increase them. If you have problems or questions, call your caregiver or physical therapist for advice.   HOME CARE INSTRUCTIONS  Remove items at home which could result in a fall. This includes throw rugs or furniture in walking pathways.  ICE to the affected knee every three hours for 30 minutes at a time and then as needed for pain  and swelling.  Continue to use ice on the knee for pain and swelling from surgery. You may notice swelling that will progress down to the foot and ankle.  This is normal after surgery.  Elevate the leg when you are not up walking on it.   Continue to use the breathing machine which will help keep your temperature down.  It is common for your  temperature to cycle up and down following surgery, especially at night when you are not up moving around and exerting yourself.  The breathing machine keeps your lungs expanded and your temperature down. Do not place pillow under knee, focus on keeping the knee straight while resting   DIET You may resume your previous home diet once your are discharged from the hospital.  DRESSING / WOUND CARE / SHOWERING You may change your dressing 3-5 days after surgery.  Then change the dressing every day with sterile gauze.  Please use good hand washing techniques before changing the dressing.  Do not use any lotions or creams on the incision until instructed by your surgeon. You may start showering once you are discharged home but do not submerge the incision under water. Just pat the incision dry and apply a dry gauze dressing on daily. Change the surgical dressing daily and reapply a dry dressing each time.  ACTIVITY Walk with your walker as instructed. Use walker as long as suggested by your caregivers. Avoid periods of inactivity such as sitting longer than an hour when not asleep. This helps prevent blood clots.  You may resume a sexual relationship in one month or when given the OK by your doctor.  You may return to work once you are cleared by your doctor.  Do not drive a car for 6 weeks or until released by you surgeon.  Do not drive while taking narcotics.  WEIGHT BEARING Weight bearing as tolerated with assist device (walker, cane, etc) as directed, use it as long as suggested by your surgeon or therapist, typically at least 4-6 weeks.  POSTOPERATIVE  CONSTIPATION PROTOCOL Constipation - defined medically as fewer than three stools per week and severe constipation as less than one stool per week.  One of the most common issues patients have following surgery is constipation.  Even if you have a regular bowel pattern at home, your normal regimen is likely to be disrupted due to multiple reasons following surgery.  Combination of anesthesia, postoperative narcotics, change in appetite and fluid intake all can affect your bowels.  In order to avoid complications following surgery, here are some recommendations in order to help you during your recovery period.  Colace (docusate) - Pick up an over-the-counter form of Colace or another stool softener and take twice a day as long as you are requiring postoperative pain medications.  Take with a full glass of water daily.  If you experience loose stools or diarrhea, hold the colace until you stool forms back up.  If your symptoms do not get better within 1 week or if they get worse, check with your doctor.  Dulcolax (bisacodyl) - Pick up over-the-counter and take as directed by the product packaging as needed to assist with the movement of your bowels.  Take with a full glass of water.  Use this product as needed if not relieved by Colace only.   MiraLax (polyethylene glycol) - Pick up over-the-counter to have on hand.  MiraLax is a solution that will increase the amount of water in your bowels to assist with bowel movements.  Take as directed and can mix with a glass of water, juice, soda, coffee, or tea.  Take if you go more than two days without a movement. Do not use MiraLax more than once per day. Call your doctor if you are still constipated or irregular after using this medication for 7 days in a row.  If you continue to have problems with  postoperative constipation, please contact the office for further assistance and recommendations.  If you experience "the worst abdominal pain ever" or develop  nausea or vomiting, please contact the office immediatly for further recommendations for treatment.  ITCHING  If you experience itching with your medications, try taking only a single pain pill, or even half a pain pill at a time.  You can also use Benadryl over the counter for itching or also to help with sleep.   TED HOSE STOCKINGS Wear the elastic stockings on both legs for three weeks following surgery during the day but you may remove then at night for sleeping.  MEDICATIONS See your medication summary on the "After Visit Summary" that the nursing staff will review with you prior to discharge.  You may have some home medications which will be placed on hold until you complete the course of blood thinner medication.  It is important for you to complete the blood thinner medication as prescribed by your surgeon.  Continue your approved medications as instructed at time of discharge.  PRECAUTIONS If you experience chest pain or shortness of breath - call 911 immediately for transfer to the hospital emergency department.  If you develop a fever greater that 101 F, purulent drainage from wound, increased redness or drainage from wound, foul odor from the wound/dressing, or calf pain - CONTACT YOUR SURGEON.                                                   FOLLOW-UP APPOINTMENTS Make sure you keep all of your appointments after your operation with your surgeon and caregivers. You should call the office at the above phone number and make an appointment for approximately two weeks after the date of your surgery or on the date instructed by your surgeon outlined in the "After Visit Summary".   RANGE OF MOTION AND STRENGTHENING EXERCISES  Rehabilitation of the knee is important following a knee injury or an operation. After just a few days of immobilization, the muscles of the thigh which control the knee become weakened and shrink (atrophy). Knee exercises are designed to build up the tone and  strength of the thigh muscles and to improve knee motion. Often times heat used for twenty to thirty minutes before working out will loosen up your tissues and help with improving the range of motion but do not use heat for the first two weeks following surgery. These exercises can be done on a training (exercise) mat, on the floor, on a table or on a bed. Use what ever works the best and is most comfortable for you Knee exercises include:  Leg Lifts - While your knee is still immobilized in a splint or cast, you can do straight leg raises. Lift the leg to 60 degrees, hold for 3 sec, and slowly lower the leg. Repeat 10-20 times 2-3 times daily. Perform this exercise against resistance later as your knee gets better.  Quad and Hamstring Sets - Tighten up the muscle on the front of the thigh (Quad) and hold for 5-10 sec. Repeat this 10-20 times hourly. Hamstring sets are done by pushing the foot backward against an object and holding for 5-10 sec. Repeat as with quad sets.  Leg Slides: Lying on your back, slowly slide your foot toward your buttocks, bending your knee up off the floor (only  go as far as is comfortable). Then slowly slide your foot back down until your leg is flat on the floor again. Angel Wings: Lying on your back spread your legs to the side as far apart as you can without causing discomfort.  A rehabilitation program following serious knee injuries can speed recovery and prevent re-injury in the future due to weakened muscles. Contact your doctor or a physical therapist for more information on knee rehabilitation.   IF YOU ARE TRANSFERRED TO A SKILLED REHAB FACILITY If the patient is transferred to a skilled rehab facility following release from the hospital, a list of the current medications will be sent to the facility for the patient to continue.  When discharged from the skilled rehab facility, please have the facility set up the patient's Home Health Physical Therapy prior to being  released. Also, the skilled facility will be responsible for providing the patient with their medications at time of release from the facility to include their pain medication, the muscle relaxants, and their blood thinner medication. If the patient is still at the rehab facility at time of the two week follow up appointment, the skilled rehab facility will also need to assist the patient in arranging follow up appointment in our office and any transportation needs.  MAKE SURE YOU:  Understand these instructions.  Get help right away if you are not doing well or get worse.    Pick up stool softner and laxative for home use following surgery while on pain medications. Do not submerge incision under water. Please use good hand washing techniques while changing dressing each day. May shower starting three days after surgery. Please use a clean towel to pat the incision dry following showers. Continue to use ice for pain and swelling after surgery. Do not use any lotions or creams on the incision until instructed by your surgeon.   Do not put a pillow under the knee. Place it under the heel.   Complete by: As directed    Driving restrictions   Complete by: As directed    No driving for two weeks   TED hose   Complete by: As directed    Use stockings (TED hose) for three weeks on both leg(s).  You may remove them at night for sleeping.   Weight bearing as tolerated   Complete by: As directed      Allergies as of 06/03/2019   No Known Allergies     Medication List    STOP taking these medications   GOODY HEADACHE PO     TAKE these medications   albuterol 108 (90 Base) MCG/ACT inhaler Commonly known as: ProAir HFA Inhale 2 puffs into the lungs every 4 (four) hours as needed for wheezing or shortness of breath.   allopurinol 300 MG tablet Commonly known as: ZYLOPRIM Take 300 mg by mouth daily.   amLODipine 10 MG tablet Commonly known as: NORVASC Take 10 mg by mouth daily.    aspirin 325 MG EC tablet Take 1 tablet (325 mg total) by mouth 2 (two) times daily for 19 days. Then take one 81 mg aspirin once a day for three weeks.   gabapentin 100 MG capsule Commonly known as: NEURONTIN Take 1 capsule (100 mg total) by mouth 3 (three) times daily. 100 mg TID x 2 weeks, then 100 mg BID x 2 weeks, then 100 mg QD x 2 weeks.   glipiZIDE 10 MG 24 hr tablet Commonly known as: GLUCOTROL XL Take 20 mg  by mouth daily with breakfast.   lisinopril 40 MG tablet Commonly known as: ZESTRIL Take 40 mg by mouth daily.   loratadine 10 MG tablet Commonly known as: CLARITIN Take 10 mg by mouth daily.   methocarbamol 500 MG tablet Commonly known as: ROBAXIN Take 1 tablet (500 mg total) by mouth every 6 (six) hours as needed for muscle spasms. What changed:   when to take this  reasons to take this   metoprolol tartrate 100 MG tablet Commonly known as: LOPRESSOR Take 100 mg by mouth daily.   nateglinide 120 MG tablet Commonly known as: STARLIX Take 120 mg by mouth 3 (three) times daily before meals.   oxyCODONE 5 MG immediate release tablet Commonly known as: Oxy IR/ROXICODONE Take 1-2 tablets (5-10 mg total) by mouth every 6 (six) hours as needed for severe pain.   oxymetazoline 0.05 % nasal spray Commonly known as: AFRIN Place 2 sprays into both nostrils at bedtime.   pioglitazone 15 MG tablet Commonly known as: ACTOS Take 15 mg by mouth daily.   simvastatin 20 MG tablet Commonly known as: ZOCOR Take 20 mg by mouth every evening.   traMADol 50 MG tablet Commonly known as: ULTRAM Take 1-2 tablets (50-100 mg total) by mouth every 6 (six) hours as needed for moderate pain.   TYLENOL PO Take by mouth.   vitamin B-12 1000 MCG tablet Commonly known as: CYANOCOBALAMIN Take 1,000 mcg by mouth daily at 3 pm.   Vitamin D3 50 MCG (2000 UT) Tabs Take 50 mcg by mouth daily at 3 pm.            Discharge Care Instructions  (From admission, onward)          Start     Ordered   06/02/19 0000  Weight bearing as tolerated     06/02/19 0717   06/02/19 0000  Change dressing    Comments: Change the dressing daily with sterile 4 x 4 inch gauze dressing and apply TED hose.   06/02/19 0717         Follow-up Information    Ollen Gross, MD. Schedule an appointment as soon as possible for a visit on 06/16/2019.   Specialty: Orthopedic Surgery Contact information: [address] [place] [address] [phone]        [facility] Follow up.   Specialty: Home Health Services Why: weill call for home pt appointment Contact information: [address] [place] [address] [phone]           Signed: Arther Abbott, PA-C Orthopedic Surgery 06/08/2019, 7:55 AM

## 2019-06-23 NOTE — Progress Notes (Signed)
[facility] Note  06/24/2019     CHIEF COMPLAINT Patient presents for Retina Follow Up   HISTORY OF PRESENT ILLNESS: Joshua [name] is a 83 y.o. male who presents to the clinic today for:   HPI    Retina Follow Up    Patient presents with  Dry AMD.  In both eyes.  This started 7 months ago.  Severity is moderate.  I, the attending physician,  performed the HPI with the patient and updated documentation appropriately.          Comments    Patient here for 7 months retina follow up for non-exu ARMD OU. Patient states vision doing fine. No eye pain.        Last edited by Bernarda Caffey, MD on 06/24/2019  2:29 PM. (History)    pt states he feels like his vision is doing well, he is getting ready to have knee sx in, he was supposed to have it about 3 weeks ago, but his sugar was too high  Referring physician: Leonard Downing, MD [place],  [place] [address]  HISTORICAL INFORMATION:   Selected notes from the MEDICAL RECORD NUMBER Referral from Dr. Renaldo Harrison for concern of ARMD OU;  LEE- 11.6.18 (Dr. Renaldo Harrison) Ocular Hx- pseudophakia OU (Surgeon: Eps), DES OU PMH- elevated chol., DM, HTN   CURRENT MEDICATIONS: No current outpatient medications on file. (Ophthalmic Drugs)   No current facility-administered medications for this visit.  (Ophthalmic Drugs)   Current Outpatient Medications (Other)  Medication Sig  . Acetaminophen (TYLENOL PO) Take by mouth.  Marland Kitchen allopurinol (ZYLOPRIM) 300 MG tablet Take 300 mg by mouth daily.  Marland Kitchen amLODipine (NORVASC) 10 MG tablet Take 10 mg by mouth daily.  . Cholecalciferol (VITAMIN D3) 50 MCG (2000 UT) TABS Take 50 mcg by mouth daily at 3 pm.   . gabapentin (NEURONTIN) 100 MG capsule Take 1 capsule (100 mg total) by mouth 3 (three) times daily. 100 mg TID x 2 weeks, then 100 mg BID x 2 weeks, then 100 mg QD x 2 weeks.  Marland Kitchen glipiZIDE (GLUCOTROL XL) 10 MG 24 hr tablet Take 20 mg by mouth daily  with breakfast.  . lisinopril (PRINIVIL,ZESTRIL) 40 MG tablet Take 40 mg by mouth daily.   Marland Kitchen loratadine (CLARITIN) 10 MG tablet Take 10 mg by mouth daily.  . methocarbamol (ROBAXIN) 500 MG tablet Take 1 tablet (500 mg total) by mouth every 6 (six) hours as needed for muscle spasms.  . metoprolol tartrate (LOPRESSOR) 100 MG tablet Take 100 mg by mouth daily.   . nateglinide (STARLIX) 120 MG tablet Take 120 mg by mouth 3 (three) times daily before meals.  Marland Kitchen oxyCODONE (OXY IR/ROXICODONE) 5 MG immediate release tablet Take 1-2 tablets (5-10 mg total) by mouth every 6 (six) hours as needed for severe pain.  Marland Kitchen oxymetazoline (AFRIN) 0.05 % nasal spray Place 2 sprays into both nostrils at bedtime.  . pioglitazone (ACTOS) 15 MG tablet Take 15 mg by mouth daily.  . simvastatin (ZOCOR) 20 MG tablet Take 20 mg by mouth every evening.   . traMADol (ULTRAM) 50 MG tablet Take 1-2 tablets (50-100 mg total) by mouth every 6 (six) hours as needed for moderate pain.  . vitamin B-12 (CYANOCOBALAMIN) 1000 MCG tablet Take 1,000 mcg by mouth daily at 3 pm.  . albuterol (PROAIR HFA) 108 (90 Base) MCG/ACT inhaler Inhale 2 puffs into the lungs every 4 (four) hours as needed  for wheezing or shortness of breath. (Patient not taking: Reported on 06/24/2019)   No current facility-administered medications for this visit.  (Other)      REVIEW OF SYSTEMS: ROS    Positive for: Musculoskeletal, Endocrine, Eyes, Allergic/Imm   Negative for: Constitutional, Gastrointestinal, Neurological, Skin, Genitourinary, HENT, Cardiovascular, Respiratory, Psychiatric, Heme/Lymph   Last edited by Laddie Aquaslarke, Rebecca S on 06/24/2019  2:14 PM. (History)       ALLERGIES No Known Allergies  PAST MEDICAL HISTORY Past Medical History:  Diagnosis Date  . Anemia    hx.  . Arthritis   . COPD (chronic obstructive pulmonary disease) (HCC)    reports at one poin he had copd but he has stopped smoking and now doesnt have any trouble breathing or   need an inhaler   . Diabetes mellitus without complication (HCC)    type 2  . Headache    none recently   . High cholesterol   . History of kidney stones   . HOH (hard of hearing)    both ears  . Hypertension   . Pneumonia    years ago   Past Surgical History:  Procedure Laterality Date  . ANKLE FRACTURE SURGERY Left    when [age]- years old  . ANTERIOR CERVICAL DECOMP/DISCECTOMY FUSION N/A 07/11/2017   Procedure: Anterior Cervical Decompression/Discectomy Fusion  - Cervical three-four;  Surgeon: Donalee Citrinram, Gary, MD;  [facility];  Service: Neurosurgery;  Laterality: N/A; limited extension of neck currently   . CATARACT EXTRACTION Bilateral   . FRACTURE SURGERY      fx. lower left leg at age 81-13  . TOTAL KNEE ARTHROPLASTY Right 11/11/2017   Procedure: RIGHT TOTAL KNEE ARTHROPLASTY;  Surgeon: Ollen GrossAluisio, Frank, MD;  Location: [facility] ORS;  Service: Orthopedics;  Laterality: Right;  . TOTAL KNEE ARTHROPLASTY Left 06/01/2019   Procedure: LEFT TOTAL KNEE ARTHROPLASTY;  Surgeon: Ollen GrossAluisio, Frank, MD;  Location: [facility] ORS;  Service: Orthopedics;  Laterality: Left;  50min    FAMILY HISTORY Family History  Problem Relation Age of Onset  . Strabismus Daughter     SOCIAL HISTORY Social History   Tobacco Use  . Smoking status: Former Smoker    Packs/day: 2.00    Years: 60.00    Pack years: 120.00    Types: Cigarettes    Quit date: 07/10/2016    Years since quitting: 2.9  . Smokeless tobacco: Never Used  Substance Use Topics  . Alcohol use: Never    Frequency: Never  . Drug use: Never         OPHTHALMIC EXAM:  Base Eye Exam    Visual Acuity (Snellen - Linear)      Right Left   Dist [place] 20/20 -1 20/20 -2       Tonometry (Tonopen, 2:11 PM)      Right Left   Pressure 17 13       Pupils      Dark Light Shape React APD   Right 4 3 Round Brisk None   Left 4 3 Round Brisk None       Visual Fields (Counting fingers)      Left Right    Full Full       Extraocular Movement       Right Left    Full Full       Neuro/Psych    Oriented x3: Yes   Mood/Affect: Normal       Dilation    Both eyes: 1.0% Mydriacyl, 2.5% Phenylephrine @  2:11 PM        Slit Lamp and Fundus Exam    Slit Lamp Exam      Right Left   Lids/Lashes Dermatochalasis - upper lid, Telangiectasia Dermatochalasis - upper lid, Telangiectasia   Conjunctiva/Sclera nasal and temporal Pinguecula temporal Pinguecula   Cornea subepithelial scarring para central, Arcus Round subepithelial scarring nasal para central, Arcus, dry tear film   Anterior Chamber Deep and quiet Deep and quiet   Iris Round and moderately dilated Round and dilated   Lens Three piece Posterior chamber intraocular lens in good position Three piece Posterior chamber intraocular lens in good position with open PC   Vitreous Vitreous syneresis, Posterior vitreous detachment Vitreous syneresis, Posterior vitreous detachment       Fundus Exam      Right Left   Disc Pink and Sharp, trace Pallor Pink and Sharp   C/D Ratio 0.5 0.5   Macula Blunted foveal reflex, Drusen, RPE mottling and clumping, No heme or edema Flat, Drusen, no heme or edema, Retinal pigment epithelial mottling   Vessels Vascular attenuation, Tortuous, AV crossing changes Vascular attenuation, Tortuous, mild AV crossing changes   Periphery attached, Superior pigmented lattice degeneration with excellent laser surrounding  attached, Superior pigmented lattice degeneration with small atrophic holes - excellent laser surrounding          IMAGING AND PROCEDURES  Imaging and Procedures for 01/07/18  OCT, Retina - OU - Both Eyes       Right Eye Quality was good. Central Foveal Thickness: 257. Progression has been stable. Findings include normal foveal contour, no IRF, no SRF, retinal drusen .   Left Eye Quality was good. Central Foveal Thickness: 252. Progression has been stable. Findings include normal foveal contour, no IRF, no SRF, retinal drusen .    Notes Images taken, stored on drive  Diagnosis / Impression:  Mild Non-exudative ARMD OU stable drusen from prior OU  Clinical management:  See below  Abbreviations: NFP - Normal foveal profile. CME - cystoid macular edema. PED - pigment epithelial detachment. IRF - intraretinal fluid. SRF - subretinal fluid. EZ - ellipsoid zone. ERM - epiretinal membrane. ORA - outer retinal atrophy. ORT - outer retinal tubulation. SRHM - subretinal hyper-reflective material                  ASSESSMENT/PLAN:    ICD-10-CM   1. Lattice degeneration of both retinas  H35.413   2. Early dry stage nonexudative age-related macular degeneration of both eyes  H35.3131   3. Diabetes mellitus type 2 without retinopathy (HCC)  E11.9   4. Pseudophakia  Z96.1   5. Retinal edema  H35.81 OCT, Retina - OU - Both Eyes    1. Lattice degeneration OU-   - pigmented lattice superiorly with atrophic holes  - s/p laser retinopexy OS (11.13.18), s/p laser retinopexy OD (11.27.18)  - excellent laser in place OU  - no new RT/RD or SRF  2. Age related macular degeneration, non-exudative, both eyes   - OCT stable -- no significant change in drusen  - The incidence, anatomy, and pathology of dry AMD, risk of progression, and the AREDS and AREDS 2 study including smoking risks discussed with patient.  - Recommend amsler grid monitoring  - will continue to monitor  - f/u in 6-9 mos, DFE, OCT  3. DM2 w/o retinopathy The incidence, risk factors for progression, natural history and treatment options for diabetic retinopathy  were discussed with patient.  The need for close  monitoring of blood glucose, blood pressure, and serum lipids, avoiding cigarette or any type of tobacco, and the need for long term follow up was also discussed with patient. - monitor  4. Pseudophakia OU  - s/p CE/IOL OU  - beautiful surgery, doing well  - monitor   Ophthalmic Meds Ordered this visit:  No orders of the defined types were  placed in this encounter.      Return for 6-9 mos, Dilated Exam, OCT.  There are no Patient Instructions on file for this visit.   Explained the diagnoses, plan, and follow up with the patient and they expressed understanding.  Patient expressed understanding of the importance of proper follow up care.   This document serves as a record of services personally performed by Karie ChimeraBrian G. Tristine Langi, MD, PhD. It was created on their behalf by Laurian BrimAmanda Brown, OA, an ophthalmic assistant. The creation of this record is the provider's dictation and/or activities during the visit.    Electronically signed by: Laurian BrimAmanda Brown, OA 07.14.2020 10:36 PM     Karie ChimeraBrian G. Tineka Uriegas, M.D., Ph.D. Diseases & Surgery of the Retina and Vitreous [facility]  I have reviewed the above documentation for accuracy and completeness, and I agree with the above. Karie ChimeraBrian G. Keshawn Sundberg, M.D., Ph.D. 06/24/19 10:38 PM     Abbreviations: M myopia (nearsighted); A astigmatism; H hyperopia (farsighted); P presbyopia; Mrx spectacle prescription;  CTL contact lenses; OD right eye; OS left eye; OU both eyes  XT exotropia; ET esotropia; PEK punctate epithelial keratitis; PEE punctate epithelial erosions; DES dry eye syndrome; MGD meibomian gland dysfunction; ATs artificial tears; PFAT's preservative free artificial tears; NSC nuclear sclerotic cataract; PSC posterior subcapsular cataract; ERM epi-retinal membrane; PVD posterior vitreous detachment; RD retinal detachment; DM diabetes mellitus; DR diabetic retinopathy; NPDR non-proliferative diabetic retinopathy; PDR proliferative diabetic retinopathy; CSME clinically significant macular edema; DME diabetic macular edema; dbh dot blot hemorrhages; CWS cotton wool spot; POAG primary open angle glaucoma; C/D cup-to-disc ratio; HVF humphrey visual field; GVF goldmann visual field; OCT optical coherence tomography; IOP intraocular pressure; BRVO Branch retinal vein occlusion; CRVO  central retinal vein occlusion; CRAO central retinal artery occlusion; BRAO branch retinal artery occlusion; RT retinal tear; SB scleral buckle; PPV pars plana vitrectomy; VH Vitreous hemorrhage; PRP panretinal laser photocoagulation; IVK intravitreal kenalog; VMT vitreomacular traction; MH Macular hole;  NVD neovascularization of the disc; NVE neovascularization elsewhere; AREDS age related eye disease study; ARMD age related macular degeneration; POAG primary open angle glaucoma; EBMD epithelial/anterior basement membrane dystrophy; ACIOL anterior chamber intraocular lens; IOL intraocular lens; PCIOL posterior chamber intraocular lens; Phaco/IOL phacoemulsification with intraocular lens placement; PRK photorefractive keratectomy; LASIK laser assisted in situ keratomileusis; HTN hypertension; DM diabetes mellitus; COPD chronic obstructive pulmonary disease

## 2019-06-24 ENCOUNTER — Other Ambulatory Visit: Payer: Self-pay

## 2019-06-24 ENCOUNTER — Ambulatory Visit (INDEPENDENT_AMBULATORY_CARE_PROVIDER_SITE_OTHER): Payer: Medicare Other | Admitting: Ophthalmology

## 2019-06-24 ENCOUNTER — Encounter (INDEPENDENT_AMBULATORY_CARE_PROVIDER_SITE_OTHER): Payer: Self-pay | Admitting: Ophthalmology

## 2019-06-24 DIAGNOSIS — H353131 Nonexudative age-related macular degeneration, bilateral, early dry stage: Secondary | ICD-10-CM

## 2019-06-24 DIAGNOSIS — H35413 Lattice degeneration of retina, bilateral: Secondary | ICD-10-CM | POA: Diagnosis not present

## 2019-06-24 DIAGNOSIS — Z961 Presence of intraocular lens: Secondary | ICD-10-CM

## 2019-06-24 DIAGNOSIS — H3581 Retinal edema: Secondary | ICD-10-CM

## 2019-06-24 DIAGNOSIS — E119 Type 2 diabetes mellitus without complications: Secondary | ICD-10-CM

## 2019-07-02 DIAGNOSIS — M25562 Pain in left knee: Secondary | ICD-10-CM | POA: Insufficient documentation

## 2019-07-14 IMAGING — DX DG CHEST 2V
2 series · 2 of 2 positions shown · non-contrast
Comparison: 07/26/2004

CLINICAL DATA: Seven 11-year-old male for preoperative evaluation.

EXAM:
CHEST  2 VIEW

[dg chest 2 view (1 of 2)]
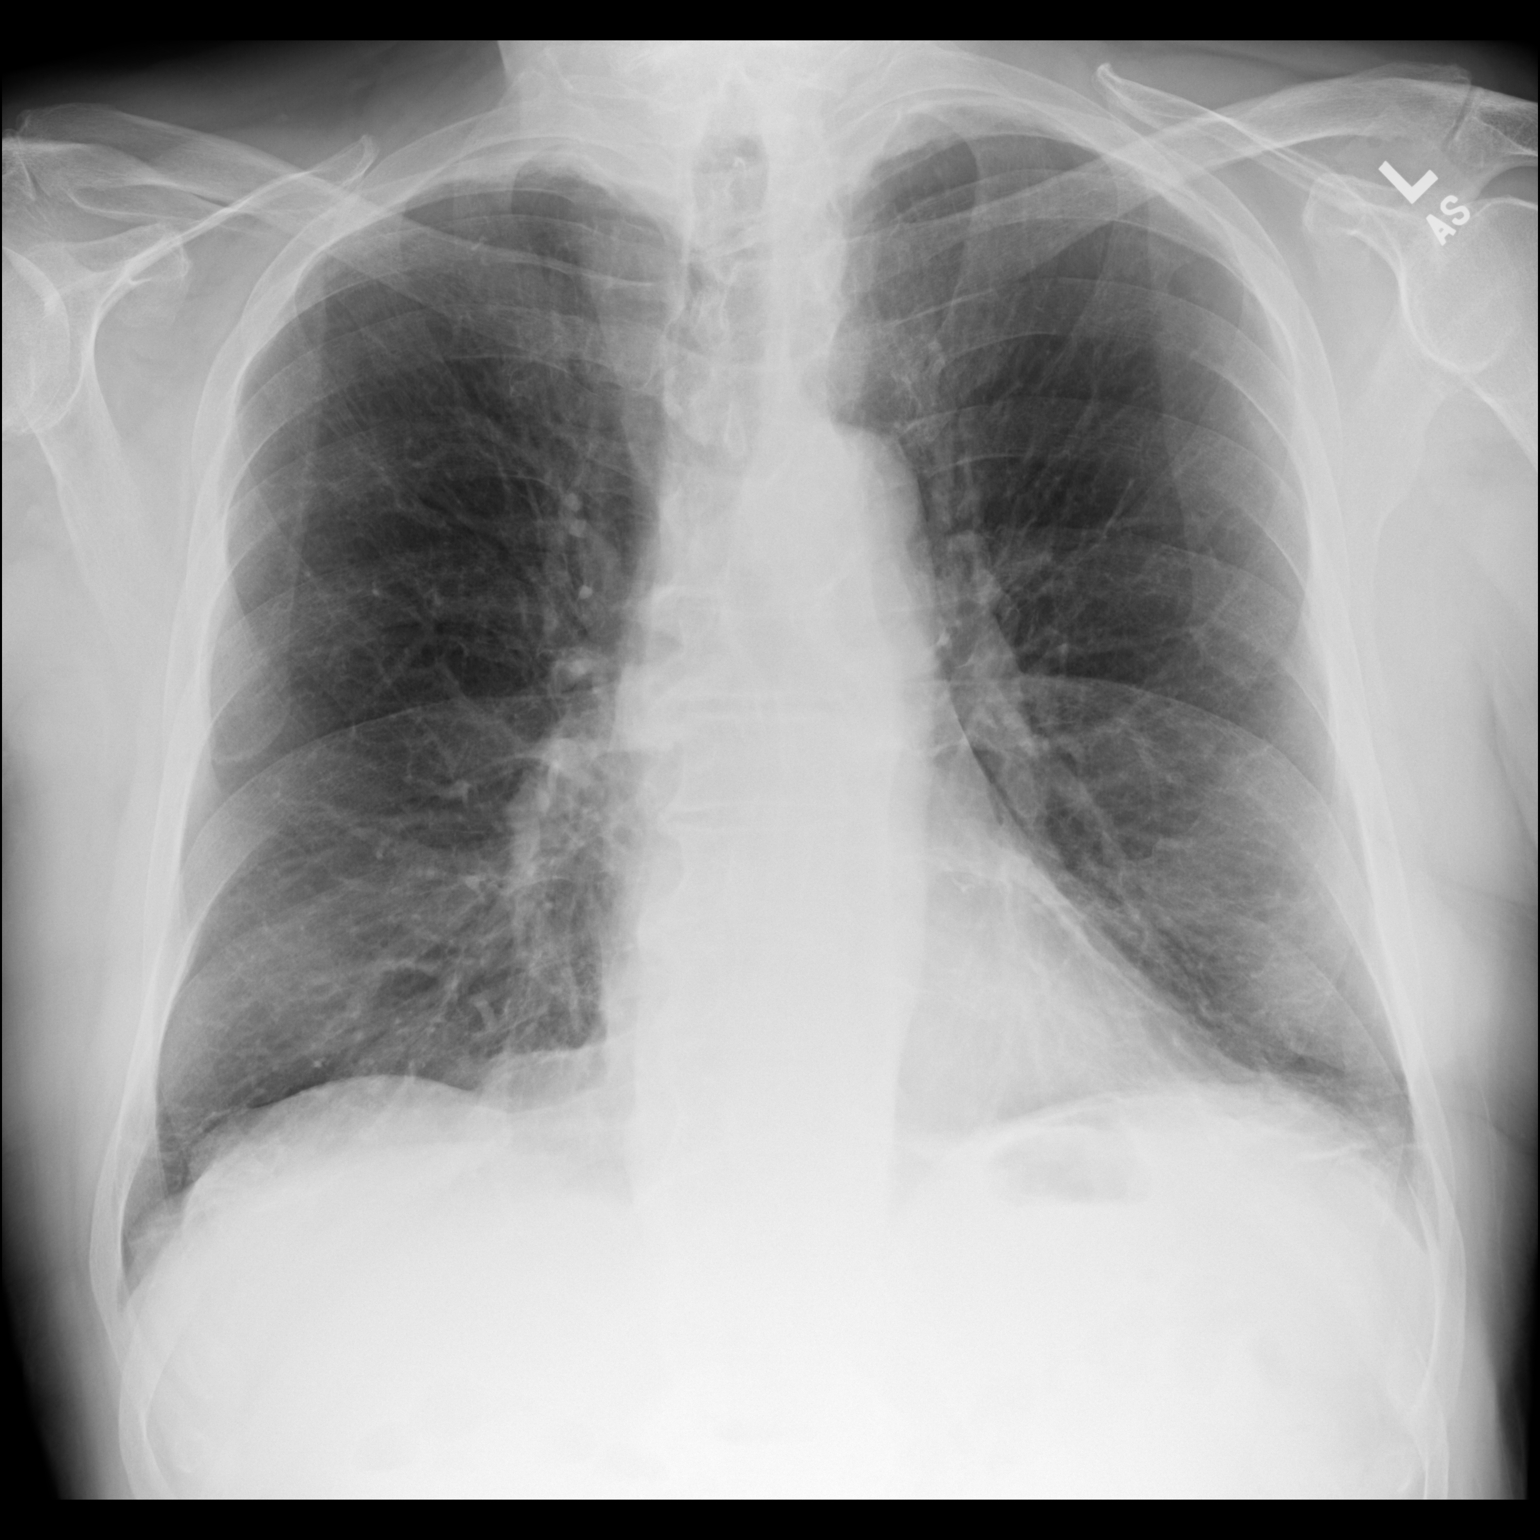

[dg chest 2 view (2 of 2)]
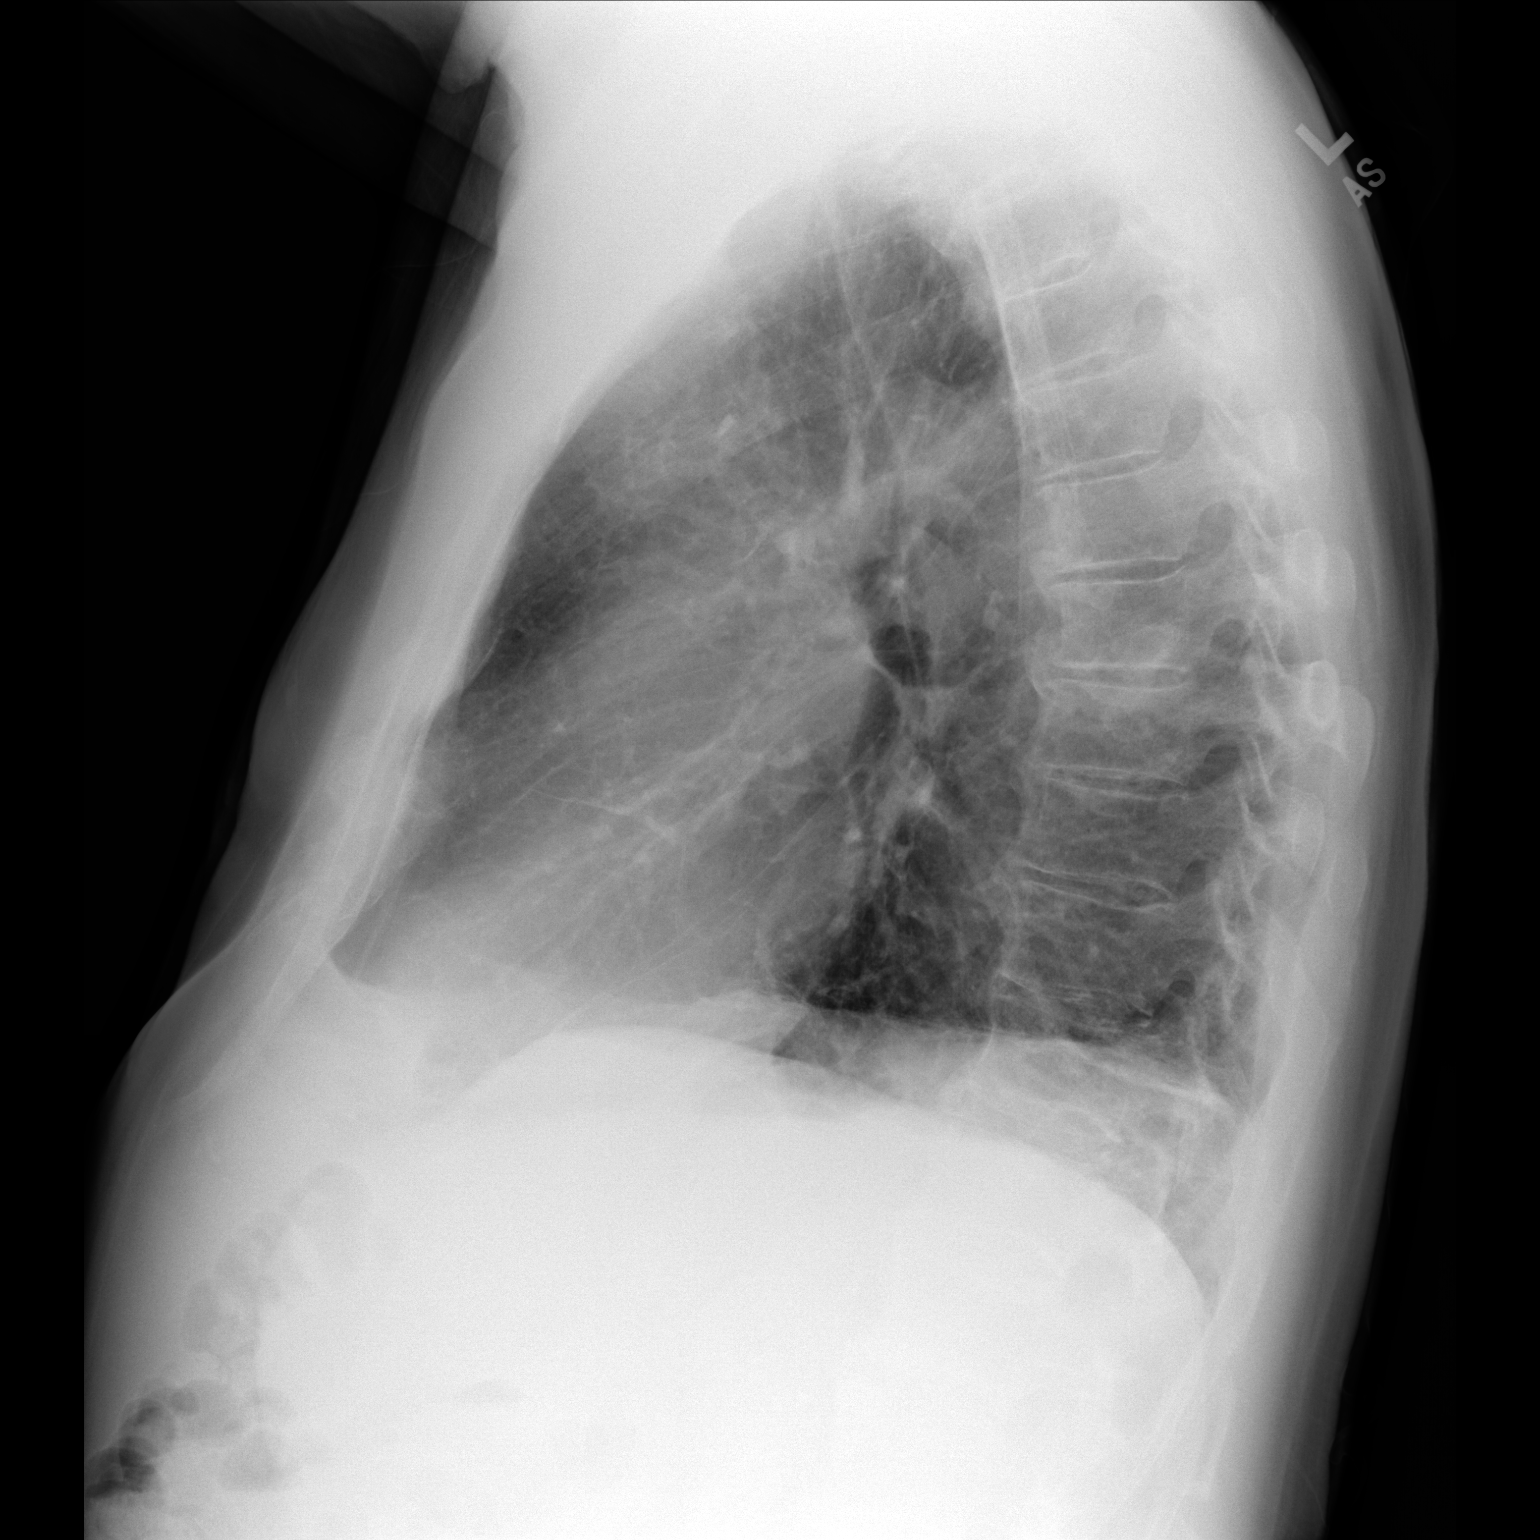

[2 of 2 positions shown; findings below may reference images not displayed]

FINDINGS: Cardiomediastinal silhouette is unchanged within normal limits.

There is prominence of the interstitium with basilar oral
atelectasis and pleural thickening. No definite focal opacities,
sizable effusion or pneumothorax.

No acute osseous abnormalities.
IMPRESSION: Chronic changes of interstitial prominence and pleural thickening.
No active disease.

## 2019-09-15 ENCOUNTER — Ambulatory Visit: Payer: Medicare Other | Admitting: *Deleted

## 2020-03-23 ENCOUNTER — Encounter (INDEPENDENT_AMBULATORY_CARE_PROVIDER_SITE_OTHER): Payer: Medicare Other | Admitting: Ophthalmology

## 2020-05-02 NOTE — Progress Notes (Signed)
[facility] - Clinic Note  05/03/2020     CHIEF COMPLAINT Patient presents for Retina Evaluation   HISTORY OF PRESENT ILLNESS: Joshua [name] is a 84 y.o. male who presents to the clinic today for:   HPI    Retina Evaluation    In both eyes.  This started 1 year ago.  Duration of 1 year.  Associated Symptoms Flashes and Blind Spot.  Negative for Distortion, Pain, Photophobia, Trauma, Jaw Claudication, Fever, Fatigue, Floaters, Redness, Glare, Scalp Tenderness, Shoulder/Hip pain and Weight Loss.  Context:  near vision, distance vision and mid-range vision.  Treatments tried include no treatments.  I, the attending physician,  performed the HPI with the patient and updated documentation appropriately.          Comments    Patient c/o loss of vision on right side for the past year (in the right eye). Difficulty when coming in from bright lights outside to dimmer lights inside. Occasional pain on right side of head in temple area. Vision double intermittently but clears if patient blinks. No pain when chewing. Patient is diabetic. BS was 190 this am.        Last edited by Rennis Chris, MD on 05/03/2020 11:12 PM. (History)    pts wife states that he tells her often that "everything is black" and asks her to turn on the lights in their house, pt states he has the same glasses that he got after cataract sx, but he does not wear them unless he's trying to read, he states it takes his eyes a long time to adjust to changes in light, pt states he was taking AREDS 2, but they made his eyes sore, so he stopped, pt states these changes have been happening for at least a year and have been stable since then  Referring physician: Kaleen Mask, MD [address] [place],  [place] [address]  HISTORICAL INFORMATION:   Selected notes from the MEDICAL RECORD NUMBER Referral from Dr. Eugene Garnet for concern of ARMD OU   CURRENT MEDICATIONS: No current outpatient  medications on file. (Ophthalmic Drugs)   No current facility-administered medications for this visit. (Ophthalmic Drugs)   Current Outpatient Medications (Other)  Medication Sig  . Acetaminophen (TYLENOL PO) Take by mouth.  Marland Kitchen allopurinol (ZYLOPRIM) 300 MG tablet Take 300 mg by mouth daily. 1/2 tablet daily  . amLODipine (NORVASC) 10 MG tablet Take 10 mg by mouth daily.  . Cholecalciferol (VITAMIN D3) 50 MCG (2000 UT) TABS Take 50 mcg by mouth daily at 3 pm.   . glipiZIDE (GLUCOTROL XL) 10 MG 24 hr tablet Take 20 mg by mouth daily with breakfast.  . JANUVIA 100 MG tablet Take 100 mg by mouth daily.  Marland Kitchen lisinopril (PRINIVIL,ZESTRIL) 40 MG tablet Take 40 mg by mouth daily.   . metoprolol tartrate (LOPRESSOR) 100 MG tablet Take 100 mg by mouth daily.   . predniSONE (DELTASONE) 20 MG tablet Take 20 mg by mouth as directed.  . simvastatin (ZOCOR) 20 MG tablet Take 20 mg by mouth every evening.   Marland Kitchen albuterol (PROAIR HFA) 108 (90 Base) MCG/ACT inhaler Inhale 2 puffs into the lungs every 4 (four) hours as needed for wheezing or shortness of breath. (Patient not taking: Reported on 06/24/2019)  . gabapentin (NEURONTIN) 100 MG capsule Take 1 capsule (100 mg total) by mouth 3 (three) times daily. 100 mg TID x 2 weeks, then 100 mg BID x 2 weeks, then 100 mg  QD x 2 weeks. (Patient not taking: Reported on 05/03/2020)  . loratadine (CLARITIN) 10 MG tablet Take 10 mg by mouth daily.  . methocarbamol (ROBAXIN) 500 MG tablet Take 1 tablet (500 mg total) by mouth every 6 (six) hours as needed for muscle spasms. (Patient not taking: Reported on 05/03/2020)  . nateglinide (STARLIX) 120 MG tablet Take 120 mg by mouth 3 (three) times daily before meals.  Marland Kitchen oxyCODONE (OXY IR/ROXICODONE) 5 MG immediate release tablet Take 1-2 tablets (5-10 mg total) by mouth every 6 (six) hours as needed for severe pain. (Patient not taking: Reported on 05/03/2020)  . oxymetazoline (AFRIN) 0.05 % nasal spray Place 2 sprays into both  nostrils at bedtime.  . pioglitazone (ACTOS) 15 MG tablet Take 15 mg by mouth daily.  . traMADol (ULTRAM) 50 MG tablet Take 1-2 tablets (50-100 mg total) by mouth every 6 (six) hours as needed for moderate pain. (Patient not taking: Reported on 05/03/2020)  . vitamin B-12 (CYANOCOBALAMIN) 1000 MCG tablet Take 1,000 mcg by mouth daily at 3 pm.   No current facility-administered medications for this visit. (Other)      REVIEW OF SYSTEMS: ROS    Positive for: Musculoskeletal, Endocrine, Eyes, Allergic/Imm   Negative for: Constitutional, Gastrointestinal, Neurological, Skin, Genitourinary, HENT, Cardiovascular, Respiratory, Psychiatric, Heme/Lymph   Last edited by Doreene Nest, COT on 05/03/2020 12:57 PM. (History)       ALLERGIES No Known Allergies  PAST MEDICAL HISTORY Past Medical History:  Diagnosis Date  . Anemia    hx.  . Arthritis   . COPD (chronic obstructive pulmonary disease) (HCC)    reports at one poin he had copd but he has stopped smoking and now doesnt have any trouble breathing or  need an inhaler   . Diabetes mellitus without complication (HCC)    type 2  . Headache    none recently   . High cholesterol   . History of kidney stones   . HOH (hard of hearing)    both ears  . Hypertension   . Pneumonia    years ago   Past Surgical History:  Procedure Laterality Date  . ANKLE FRACTURE SURGERY Left    when 38-2- years old  . ANTERIOR CERVICAL DECOMP/DISCECTOMY FUSION N/A 07/11/2017   Procedure: Anterior Cervical Decompression/Discectomy Fusion  - Cervical three-four;  Surgeon: Donalee Citrin, MD;  Location: [facility] OR;  Service: Neurosurgery;  Laterality: N/A; limited extension of neck currently   . CATARACT EXTRACTION Bilateral   . FRACTURE SURGERY      fx. lower left leg at age 23-13  . TOTAL KNEE ARTHROPLASTY Right 11/11/2017   Procedure: RIGHT TOTAL KNEE ARTHROPLASTY;  Surgeon: Ollen Gross, MD;  Location: [facility] ORS;  Service: Orthopedics;  Laterality: Right;  .  TOTAL KNEE ARTHROPLASTY Left 06/01/2019   Procedure: LEFT TOTAL KNEE ARTHROPLASTY;  Surgeon: Ollen Gross, MD;  Location: [facility] ORS;  Service: Orthopedics;  Laterality: Left;     FAMILY HISTORY Family History  Problem Relation Age of Onset  . Strabismus Daughter     SOCIAL HISTORY Social History   Tobacco Use  . Smoking status: Former Smoker    Packs/day: 2.00    Years: 60.00    Pack years: 120.00    Types: Cigarettes    Quit date: 07/10/2016    Years since quitting: 3.8  . Smokeless tobacco: Never Used  Substance Use Topics  . Alcohol use: Never  . Drug use: Never  OPHTHALMIC EXAM:  Base Eye Exam    Visual Acuity (Snellen - Linear)      Right Left   Dist cc 20/25 20/30   Dist ph cc NI NI   Correction: Glasses       Tonometry (Tonopen, 1:15 PM)      Right Left   Pressure 18 14       Pupils      Dark Light Shape React APD   Right 4 3.5 Round Minimal None   Left 4 3 Round Brisk None       Visual Fields (Counting fingers)      Left Right    Full Full       Extraocular Movement      Right Left    Full, Ortho Full, Ortho       Neuro/Psych    Oriented x3: Yes   Mood/Affect: Normal       Dilation    Both eyes: 1.0% Mydriacyl, 2.5% Phenylephrine @ 1:15 PM        Slit Lamp and Fundus Exam    Slit Lamp Exam      Right Left   Lids/Lashes Dermatochalasis - upper lid Dermatochalasis - upper lid, Meibomian gland dysfunction   Conjunctiva/Sclera nasal and temporal Pinguecula temporal Pinguecula   Cornea subepithelial scarring inferior paracentral, Arcus, trace Punctate epithelial erosions Round subepithelial scarring nasal paracentral, Arcus, dry tear film   Anterior Chamber Deep and quiet Deep and quiet   Iris Round and moderately dilated Round and dilated   Lens Three piece Posterior chamber intraocular lens in good position Three piece Posterior chamber intraocular lens in good position with open PC   Vitreous Vitreous syneresis, Posterior  vitreous detachment Vitreous syneresis, Posterior vitreous detachment       Fundus Exam      Right Left   Disc Pink and Sharp, trace Pallor Pink and Sharp   C/D Ratio 0.5 0.5   Macula Flat, Blunted foveal reflex, +superior Drusen, RPE mottling and clumping, No heme or edema Flat, blunted foveal reflex, mild Drusen, no heme or edema, Retinal pigment epithelial mottling   Vessels Vascular attenuation, Tortuous, mild AV crossing changes Vascular attenuation, Tortuous, mild AV crossing changes   Periphery attached, Superior pigmented lattice degeneration with excellent laser surrounding  attached, Superior pigmented lattice degeneration with small atrophic holes - excellent laser surrounding        Refraction    Wearing Rx      Sphere Cylinder Axis Add   Right -0.25 Sphere  +2.50   Left -0.50 +0.50 140 +2.50       Manifest Refraction      Sphere Cylinder Axis Dist VA   Right -0.50 Sphere  20/20-1   Left -1.00 +0.50 140 20/20-2          IMAGING AND PROCEDURES  Imaging and Procedures for 01/07/18  OCT, Retina - OU - Both Eyes       Right Eye Quality was good. Central Foveal Thickness: 248. Progression has been stable. Findings include normal foveal contour, no IRF, no SRF, retinal drusen  (?interval progression of drusen superior macula).   Left Eye Quality was good. Central Foveal Thickness: 245. Progression has been stable. Findings include normal foveal contour, no IRF, no SRF, retinal drusen  (Mild interval progression of drusen superior macula).   Notes Images taken, stored on drive  Diagnosis / Impression:  Mild Non-exudative ARMD OU OD: ?interval progression of drusen superior macula OS: Mild interval progression of  drusen superior macula  Clinical management:  See below  Abbreviations: NFP - Normal foveal profile. CME - cystoid macular edema. PED - pigment epithelial detachment. IRF - intraretinal fluid. SRF - subretinal fluid. EZ - ellipsoid zone. ERM -  epiretinal membrane. ORA - outer retinal atrophy. ORT - outer retinal tubulation. SRHM - subretinal hyper-reflective material                  ASSESSMENT/PLAN:    ICD-10-CM   1. Lattice degeneration of both retinas  H35.413   2. Early dry stage nonexudative age-related macular degeneration of both eyes  H35.3131   3. Diabetes mellitus type 2 without retinopathy (HCC)  E11.9   4. Pseudophakia  Z96.1   5. Retinal edema  H35.81 OCT, Retina - OU - Both Eyes   **Add on appt made today by wife due to pt's complaints of seeing black -- pt reports difficulty adjusting from bright light settings to dimmer settings** - stable for past year - no significant change on exam - OCT with mild progression of drusen OU  1. Lattice degeneration OU-   - pigmented lattice superiorly with atrophic holes  - s/p laser retinopexy OS (11.13.18), s/p laser retinopexy OD (11.27.18)  - excellent laser in place OU  - no new RT/RD or SRF  2. Age related macular degeneration, non-exudative, both eyes   - OCT relatively stable -- ?interval progression of drusen  - The incidence, anatomy, and pathology of dry AMD, risk of progression, and the AREDS and AREDS 2 study including smoking risks discussed with patient.  - Recommend amsler grid monitoring  - will continue to monitor  - f/u in 4-6 mos, DFE, OCT  3. DM2 w/o retinopathy  - The incidence, risk factors for progression, natural history and treatment options for diabetic retinopathy  were discussed with patient.  The need for close monitoring of blood glucose, blood pressure, and serum lipids, avoiding cigarette or any type of tobacco, and the need for long term follow up was also discussed with patient.  - monitor  4. Pseudophakia OU  - s/p CE/IOL OU  - beautiful surgery, doing well  - monitor   Ophthalmic Meds Ordered this visit:  No orders of the defined types were placed in this encounter.      Return for f/u 4-6 months, non-exu ARMD OU,  DFE, OCT.  There are no Patient Instructions on file for this visit.   Explained the diagnoses, plan, and follow up with the patient and they expressed understanding.  Patient expressed understanding of the importance of proper follow up care.   This document serves as a record of services personally performed by Karie Chimera, MD, PhD. It was created on their behalf by Cristopher Estimable, COT an ophthalmic technician. The creation of this record is the provider's dictation and/or activities during the visit.    Electronically signed by: Cristopher Estimable, COT 05/02/20 @ 11:16 PM   This document serves as a record of services personally performed by Karie Chimera, MD, PhD. It was created on their behalf by Laurian Brim, OA, an ophthalmic assistant. The creation of this record is the provider's dictation and/or activities during the visit.    Electronically signed by: Laurian Brim, OA 05.25.2021 11:16 PM  Karie Chimera, M.D., Ph.D. Diseases & Surgery of the Retina and Vitreous [facility] 05/03/2020   I have reviewed the above documentation for accuracy and completeness, and I agree with the above. Arlys John  Antony Haste, M.D., Ph.D. 05/03/20 11:16 PM   Abbreviations: M myopia (nearsighted); A astigmatism; H hyperopia (farsighted); P presbyopia; Mrx spectacle prescription;  CTL contact lenses; OD right eye; OS left eye; OU both eyes  XT exotropia; ET esotropia; PEK punctate epithelial keratitis; PEE punctate epithelial erosions; DES dry eye syndrome; MGD meibomian gland dysfunction; ATs artificial tears; PFAT's preservative free artificial tears; [place] nuclear sclerotic cataract; PSC posterior subcapsular cataract; ERM epi-retinal membrane; PVD posterior vitreous detachment; RD retinal detachment; DM diabetes mellitus; DR diabetic retinopathy; NPDR non-proliferative diabetic retinopathy; PDR proliferative diabetic retinopathy; CSME clinically significant macular edema; DME diabetic  macular edema; dbh dot blot hemorrhages; CWS cotton wool spot; POAG primary open angle glaucoma; C/D cup-to-disc ratio; HVF humphrey visual field; GVF goldmann visual field; OCT optical coherence tomography; IOP intraocular pressure; BRVO Branch retinal vein occlusion; CRVO central retinal vein occlusion; CRAO central retinal artery occlusion; BRAO branch retinal artery occlusion; RT retinal tear; SB scleral buckle; PPV pars plana vitrectomy; VH Vitreous hemorrhage; PRP panretinal laser photocoagulation; IVK intravitreal kenalog; VMT vitreomacular traction; MH Macular hole;  NVD neovascularization of the disc; NVE neovascularization elsewhere; AREDS age related eye disease study; ARMD age related macular degeneration; POAG primary open angle glaucoma; EBMD epithelial/anterior basement membrane dystrophy; ACIOL anterior chamber intraocular lens; IOL intraocular lens; PCIOL posterior chamber intraocular lens; Phaco/IOL phacoemulsification with intraocular lens placement; [place] photorefractive keratectomy; LASIK laser assisted in situ keratomileusis; HTN hypertension; DM diabetes mellitus; COPD chronic obstructive pulmonary disease

## 2020-05-03 ENCOUNTER — Encounter (INDEPENDENT_AMBULATORY_CARE_PROVIDER_SITE_OTHER): Payer: Self-pay | Admitting: Ophthalmology

## 2020-05-03 ENCOUNTER — Other Ambulatory Visit: Payer: Self-pay

## 2020-05-03 ENCOUNTER — Ambulatory Visit (INDEPENDENT_AMBULATORY_CARE_PROVIDER_SITE_OTHER): Payer: Medicare Other | Admitting: Ophthalmology

## 2020-05-03 DIAGNOSIS — H35413 Lattice degeneration of retina, bilateral: Secondary | ICD-10-CM

## 2020-05-03 DIAGNOSIS — Z961 Presence of intraocular lens: Secondary | ICD-10-CM | POA: Diagnosis not present

## 2020-05-03 DIAGNOSIS — E119 Type 2 diabetes mellitus without complications: Secondary | ICD-10-CM | POA: Diagnosis not present

## 2020-05-03 DIAGNOSIS — H353131 Nonexudative age-related macular degeneration, bilateral, early dry stage: Secondary | ICD-10-CM | POA: Diagnosis not present

## 2020-05-03 DIAGNOSIS — H3581 Retinal edema: Secondary | ICD-10-CM | POA: Diagnosis not present

## 2020-05-20 DIAGNOSIS — M545 Low back pain, unspecified: Secondary | ICD-10-CM | POA: Insufficient documentation

## 2020-06-14 ENCOUNTER — Other Ambulatory Visit: Payer: Self-pay | Admitting: Family Medicine

## 2020-06-14 ENCOUNTER — Ambulatory Visit
Admission: RE | Admit: 2020-06-14 | Discharge: 2020-06-14 | Disposition: A | Discharge status: Home / Self Care | Payer: Medicare Other | Source: Ambulatory Visit | Attending: Family Medicine | Admitting: Family Medicine

## 2020-06-14 DIAGNOSIS — R042 Hemoptysis: Secondary | ICD-10-CM

## 2020-07-01 ENCOUNTER — Emergency Department (HOSPITAL_BASED_OUTPATIENT_CLINIC_OR_DEPARTMENT_OTHER): Payer: Medicare Other

## 2020-07-01 ENCOUNTER — Emergency Department (HOSPITAL_BASED_OUTPATIENT_CLINIC_OR_DEPARTMENT_OTHER)
Admission: EM | Admit: 2020-07-01 | Discharge: 2020-07-02 | Disposition: A | Discharge status: Home / Self Care | Payer: Medicare Other | Attending: Emergency Medicine | Admitting: Emergency Medicine

## 2020-07-01 ENCOUNTER — Encounter (HOSPITAL_BASED_OUTPATIENT_CLINIC_OR_DEPARTMENT_OTHER): Payer: Self-pay | Admitting: Emergency Medicine

## 2020-07-01 ENCOUNTER — Other Ambulatory Visit: Payer: Self-pay

## 2020-07-01 DIAGNOSIS — N201 Calculus of ureter: Secondary | ICD-10-CM

## 2020-07-01 DIAGNOSIS — Z7951 Long term (current) use of inhaled steroids: Secondary | ICD-10-CM | POA: Insufficient documentation

## 2020-07-01 DIAGNOSIS — N179 Acute kidney failure, unspecified: Secondary | ICD-10-CM | POA: Insufficient documentation

## 2020-07-01 DIAGNOSIS — Z20822 Contact with and (suspected) exposure to covid-19: Secondary | ICD-10-CM | POA: Insufficient documentation

## 2020-07-01 DIAGNOSIS — Z79899 Other long term (current) drug therapy: Secondary | ICD-10-CM | POA: Diagnosis not present

## 2020-07-01 DIAGNOSIS — J449 Chronic obstructive pulmonary disease, unspecified: Secondary | ICD-10-CM | POA: Insufficient documentation

## 2020-07-01 DIAGNOSIS — R1110 Vomiting, unspecified: Secondary | ICD-10-CM | POA: Insufficient documentation

## 2020-07-01 DIAGNOSIS — Z87891 Personal history of nicotine dependence: Secondary | ICD-10-CM | POA: Insufficient documentation

## 2020-07-01 DIAGNOSIS — Z7984 Long term (current) use of oral hypoglycemic drugs: Secondary | ICD-10-CM | POA: Insufficient documentation

## 2020-07-01 DIAGNOSIS — I10 Essential (primary) hypertension: Secondary | ICD-10-CM | POA: Diagnosis not present

## 2020-07-01 DIAGNOSIS — R112 Nausea with vomiting, unspecified: Secondary | ICD-10-CM | POA: Insufficient documentation

## 2020-07-01 DIAGNOSIS — E875 Hyperkalemia: Secondary | ICD-10-CM | POA: Diagnosis not present

## 2020-07-01 DIAGNOSIS — R1111 Vomiting without nausea: Secondary | ICD-10-CM

## 2020-07-01 DIAGNOSIS — K2900 Acute gastritis without bleeding: Secondary | ICD-10-CM

## 2020-07-01 DIAGNOSIS — E119 Type 2 diabetes mellitus without complications: Secondary | ICD-10-CM | POA: Diagnosis not present

## 2020-07-01 DIAGNOSIS — Z96653 Presence of artificial knee joint, bilateral: Secondary | ICD-10-CM | POA: Insufficient documentation

## 2020-07-01 DIAGNOSIS — K2970 Gastritis, unspecified, without bleeding: Secondary | ICD-10-CM | POA: Diagnosis present

## 2020-07-01 LAB — URINALYSIS, ROUTINE W REFLEX MICROSCOPIC
Bilirubin Urine: NEGATIVE
Glucose, UA: NEGATIVE mg/dL
Hgb urine dipstick: NEGATIVE
Ketones, ur: NEGATIVE mg/dL
Leukocytes,Ua: NEGATIVE
Nitrite: NEGATIVE
Protein, ur: NEGATIVE mg/dL
Specific Gravity, Urine: 1.015 (ref 1.005–1.030)
pH: 6 (ref 5.0–8.0)

## 2020-07-01 LAB — BASIC METABOLIC PANEL
Anion gap: 10 (ref 5–15)
BUN: 35 mg/dL — ABNORMAL HIGH (ref 8–23)
CO2: 19 mmol/L — ABNORMAL LOW (ref 22–32)
Calcium: 9.5 mg/dL (ref 8.9–10.3)
Chloride: 105 mmol/L (ref 98–111)
Creatinine, Ser: 1.77 mg/dL — ABNORMAL HIGH (ref 0.61–1.24)
GFR calc Af Amer: 41 mL/min — ABNORMAL LOW (ref >60)
GFR calc non Af Amer: 35 mL/min — ABNORMAL LOW (ref >60)
Glucose, Bld: 105 mg/dL — ABNORMAL HIGH (ref 70–99)
Potassium: 5.5 mmol/L — ABNORMAL HIGH (ref 3.5–5.1)
Sodium: 134 mmol/L — ABNORMAL LOW (ref 135–145)

## 2020-07-01 LAB — CBC WITH DIFFERENTIAL/PLATELET
Abs Immature Granulocytes: 0.1 10*3/uL — ABNORMAL HIGH (ref 0.00–0.07)
Basophils Absolute: 0 10*3/uL (ref 0.0–0.1)
Basophils Relative: 0 %
Eosinophils Absolute: 0 10*3/uL (ref 0.0–0.5)
Eosinophils Relative: 0 %
HCT: 40.1 % (ref 39.0–52.0)
Hemoglobin: 13.1 g/dL (ref 13.0–17.0)
Immature Granulocytes: 1 %
Lymphocytes Relative: 8 %
Lymphs Abs: 1.1 10*3/uL (ref 0.7–4.0)
MCH: 32.2 pg (ref 26.0–34.0)
MCHC: 32.7 g/dL (ref 30.0–36.0)
MCV: 98.5 fL (ref 80.0–100.0)
Monocytes Absolute: 1 10*3/uL (ref 0.1–1.0)
Monocytes Relative: 8 %
Neutro Abs: 11.6 10*3/uL — ABNORMAL HIGH (ref 1.7–7.7)
Neutrophils Relative %: 83 %
Platelets: 165 10*3/uL (ref 150–400)
RBC: 4.07 MIL/uL — ABNORMAL LOW (ref 4.22–5.81)
RDW: 13.9 % (ref 11.5–15.5)
WBC: 13.9 10*3/uL — ABNORMAL HIGH (ref 4.0–10.5)
nRBC: 0 % (ref 0.0–0.2)

## 2020-07-01 LAB — COMPREHENSIVE METABOLIC PANEL
ALT: 12 U/L (ref 0–44)
AST: 15 U/L (ref 15–41)
Albumin: 3.6 g/dL (ref 3.5–5.0)
Alkaline Phosphatase: 56 U/L (ref 38–126)
Anion gap: 11 (ref 5–15)
BUN: 40 mg/dL — ABNORMAL HIGH (ref 8–23)
CO2: 23 mmol/L (ref 22–32)
Calcium: 8.8 mg/dL — ABNORMAL LOW (ref 8.9–10.3)
Chloride: 99 mmol/L (ref 98–111)
Creatinine, Ser: 2.09 mg/dL — ABNORMAL HIGH (ref 0.61–1.24)
GFR calc Af Amer: 33 mL/min — ABNORMAL LOW (ref >60)
GFR calc non Af Amer: 29 mL/min — ABNORMAL LOW (ref >60)
Glucose, Bld: 189 mg/dL — ABNORMAL HIGH (ref 70–99)
Potassium: 6.2 mmol/L — ABNORMAL HIGH (ref 3.5–5.1)
Sodium: 133 mmol/L — ABNORMAL LOW (ref 135–145)
Total Bilirubin: 0.6 mg/dL (ref 0.3–1.2)
Total Protein: 7 g/dL (ref 6.5–8.1)

## 2020-07-01 LAB — LIPASE, BLOOD: Lipase: 28 U/L (ref 11–51)

## 2020-07-01 LAB — TROPONIN I (HIGH SENSITIVITY): Troponin I (High Sensitivity): 5 ng/L (ref <18)

## 2020-07-01 LAB — SARS CORONAVIRUS 2 BY RT PCR (HOSPITAL ORDER, PERFORMED IN ~~LOC~~ HOSPITAL LAB): SARS Coronavirus 2: NEGATIVE

## 2020-07-01 MED ORDER — SODIUM CHLORIDE 0.9 % IV SOLN
1000.0000 mL | INTRAVENOUS | Status: DC
  Administered 2020-07-01: 1000 mL via INTRAVENOUS

## 2020-07-01 MED ORDER — SODIUM CHLORIDE 0.9 % IV BOLUS (SEPSIS)
500.0000 mL | Freq: Once | INTRAVENOUS | Status: AC
  Administered 2020-07-01: 500 mL via INTRAVENOUS

## 2020-07-01 MED ORDER — SODIUM ZIRCONIUM CYCLOSILICATE 10 G PO PACK
10.0000 g | PACK | Freq: Once | ORAL | Status: AC
  Administered 2020-07-01: 10 g via ORAL
  Filled 2020-07-01: qty 1

## 2020-07-01 MED ORDER — CALCIUM GLUCONATE-NACL 1-0.675 GM/50ML-% IV SOLN
INTRAVENOUS | Status: AC
  Administered 2020-07-01: 1000 mg
  Filled 2020-07-01: qty 50

## 2020-07-01 MED ORDER — CALCIUM GLUCONATE 10 % IV SOLN
1.0000 g | Freq: Once | INTRAVENOUS | Status: DC
  Filled 2020-07-01: qty 10

## 2020-07-01 MED ORDER — PANTOPRAZOLE SODIUM 40 MG IV SOLR
40.0000 mg | Freq: Once | INTRAVENOUS | Status: AC
  Administered 2020-07-01: 40 mg via INTRAVENOUS
  Filled 2020-07-01: qty 40

## 2020-07-01 MED ORDER — ONDANSETRON HCL 4 MG/2ML IJ SOLN
4.0000 mg | Freq: Once | INTRAMUSCULAR | Status: AC
  Administered 2020-07-01: 4 mg via INTRAVENOUS
  Filled 2020-07-01: qty 2

## 2020-07-01 MED ORDER — SODIUM CHLORIDE 0.9 % IV BOLUS
500.0000 mL | Freq: Once | INTRAVENOUS | Status: AC
  Administered 2020-07-01: 500 mL via INTRAVENOUS

## 2020-07-01 NOTE — ED Notes (Signed)
Pt on monitor 

## 2020-07-01 NOTE — ED Provider Notes (Signed)
[facility] EMERGENCY DEPARTMENT Provider Note   CSN: [id number] Arrival date & time: 07/01/20  1041     History Chief Complaint  Patient presents with  . Abdominal Pain  . Chest Pain  . Emesis    Joshua MCGUIRT is a 84 y.o. [name].  HPI For 6 weeks patient has been having problems with vomiting after eating.  This usually happens after solid or fatty foods.  He reports that he does tolerate liquids and softer foods without vomiting.  Symptoms are increasing.  He reports he ate an [employer] sandwich yesterday evening.  After several bites his abdomen felt like it was really bloated.  Vomited a little bit but then about 4 5 hours later in the middle the night awakened and had multiple episodes of vomiting.  Reports he also gets diarrhea.  Never some burning epigastric and upper chest pain when he eats and drinks certain foods.  Appetite is decreasing.  He feels like his abdomen is becoming more distended although it is not persistently painful.  Patient reports his doctor did prescribe Protonix but he was only taking it sporadically, he has not been taking it recently.  No fevers or chills.  He has been seen by his PCP and had abdominal ultrasound done yesterday which did not show any acute findings.  After discussing symptoms and worsening with PCP he was advised to come to the emergency department for further evaluation.    Past Medical History:  Diagnosis Date  . Anemia    hx.  . Arthritis   . COPD (chronic obstructive pulmonary disease) (HCC)    reports at one poin he had copd but he has stopped smoking and now doesnt have any trouble breathing or  need an inhaler   . Diabetes mellitus without complication (HCC)    type 2  . Headache    none recently   . High cholesterol   . History of kidney stones   . HOH (hard of hearing)    both ears  . Hypertension   . Pneumonia    years ago    Patient Active Problem List   Diagnosis Date Noted  . Abnormal CXR 08/01/2018  .  COPD (chronic obstructive pulmonary disease) (HCC) 08/01/2018  . OA (osteoarthritis) of knee 11/11/2017  . Myelopathy (HCC) 07/11/2017  . Essential hypertension 04/30/2017  . Diabetes mellitus type 2 in nonobese (HCC) 04/30/2017  . Hyperlipidemia 04/30/2017  . Right shoulder pain 04/30/2017    Past Surgical History:  Procedure Laterality Date  . ANKLE FRACTURE SURGERY Left    when 31-60- years old  . ANTERIOR CERVICAL DECOMP/DISCECTOMY FUSION N/A 07/11/2017   Procedure: Anterior Cervical Decompression/Discectomy Fusion  - Cervical three-four;  Surgeon: Donalee Citrin, MD;  Location: [facility] OR;  Service: Neurosurgery;  Laterality: N/A; limited extension of neck currently   . CATARACT EXTRACTION Bilateral   . FRACTURE SURGERY      fx. lower left leg at age 55-13  . TOTAL KNEE ARTHROPLASTY Right 11/11/2017   Procedure: RIGHT TOTAL KNEE ARTHROPLASTY;  Surgeon: Ollen Gross, MD;  Location: [facility] ORS;  Service: Orthopedics;  Laterality: Right;  . TOTAL KNEE ARTHROPLASTY Left 06/01/2019   Procedure: LEFT TOTAL KNEE ARTHROPLASTY;  Surgeon: Ollen Gross, MD;  Location: [facility] ORS;  Service: Orthopedics;  Laterality: Left;        Family History  Problem Relation Age of Onset  . Strabismus Daughter     Social History   Tobacco Use  . Smoking status:  Former Smoker    Packs/day: 2.00    Years: 60.00    Pack years: 120.00    Types: Cigarettes    Quit date: 07/10/2016    Years since quitting: 3.9  . Smokeless tobacco: Never Used  Vaping Use  . Vaping Use: Never used  Substance Use Topics  . Alcohol use: Never  . Drug use: Never    Home Medications Prior to Admission medications   Medication Sig Start Date End Date Taking? Authorizing Provider  Acetaminophen (TYLENOL PO) Take by mouth.    [provider]  albuterol (PROAIR HFA) 108 (90 Base) MCG/ACT inhaler Inhale 2 puffs into the lungs every 4 (four) hours as needed for wheezing or shortness of breath. Patient not taking:  Reported on 06/24/2019 08/01/18   Leslye Peer, MD  allopurinol (ZYLOPRIM) 300 MG tablet Take 300 mg by mouth daily. 1/2 tablet daily    [provider]  amLODipine (NORVASC) 10 MG tablet Take 10 mg by mouth daily. 09/26/16   [provider]  Cholecalciferol (VITAMIN D3) 50 MCG (2000 UT) TABS Take 50 mcg by mouth daily at 3 pm.     [provider]  gabapentin (NEURONTIN) 100 MG capsule Take 1 capsule (100 mg total) by mouth 3 (three) times daily. 100 mg TID x 2 weeks, then 100 mg BID x 2 weeks, then 100 mg QD x 2 weeks. Patient not taking: Reported on 05/03/2020 06/03/19   Edmisten, Danford Bad L, PA  glipiZIDE (GLUCOTROL XL) 10 MG 24 hr tablet Take 20 mg by mouth daily with breakfast. 01/02/19   [provider]  JANUVIA 100 MG tablet Take 100 mg by mouth daily. 05/02/20   [provider]  lisinopril (PRINIVIL,ZESTRIL) 40 MG tablet Take 40 mg by mouth daily.     [provider]  loratadine (CLARITIN) 10 MG tablet Take 10 mg by mouth daily.    [provider]  methocarbamol (ROBAXIN) 500 MG tablet Take 1 tablet (500 mg total) by mouth every 6 (six) hours as needed for muscle spasms. Patient not taking: Reported on 05/03/2020 06/03/19   Arther Abbott L, PA  metoprolol tartrate (LOPRESSOR) 100 MG tablet Take 100 mg by mouth daily.     [provider]  nateglinide (STARLIX) 120 MG tablet Take 120 mg by mouth 3 (three) times daily before meals. 02/09/19   [provider]  oxyCODONE (OXY IR/ROXICODONE) 5 MG immediate release tablet Take 1-2 tablets (5-10 mg total) by mouth every 6 (six) hours as needed for severe pain. Patient not taking: Reported on 05/03/2020 06/03/19   Edmisten, Danford Bad L, PA  oxymetazoline (AFRIN) 0.05 % nasal spray Place 2 sprays into both nostrils at bedtime.    [provider]  pioglitazone (ACTOS) 15 MG tablet Take 15 mg by mouth daily.    [provider]  predniSONE (DELTASONE) 20 MG tablet  Take 20 mg by mouth as directed.    [provider]  simvastatin (ZOCOR) 20 MG tablet Take 20 mg by mouth every evening.     [provider]  traMADol (ULTRAM) 50 MG tablet Take 1-2 tablets (50-100 mg total) by mouth every 6 (six) hours as needed for moderate pain. Patient not taking: Reported on 05/03/2020 06/03/19   Edmisten, Lyn Hollingshead, PA  vitamin B-12 (CYANOCOBALAMIN) 1000 MCG tablet Take 1,000 mcg by mouth daily at 3 pm.    [provider]    Allergies    Patient has no known allergies.  Review  of Systems   Review of Systems 10 systems reviewed and negative except as per HPI Physical Exam Updated Vital Signs BP (!) 127/61 (BP Location: Left Arm)   Pulse 71   Temp 98.6 F (37 C) (Oral)   Resp 20   SpO2 96%   Physical Exam Constitutional:      Comments: Patient is alert and nontoxic.  No respiratory distress.  Mental status clear.  HENT:     Head: Normocephalic and atraumatic.     Mouth/Throat:     Mouth: Mucous membranes are moist.     Pharynx: Oropharynx is clear.  Eyes:     Extraocular Movements: Extraocular movements intact.     Conjunctiva/sclera: Conjunctivae normal.  Cardiovascular:     Rate and Rhythm: Normal rate and regular rhythm.  Pulmonary:     Effort: Pulmonary effort is normal.     Breath sounds: Normal breath sounds.  Abdominal:     Comments: Abdomen is soft.  Mild distention.  No guarding or rebound.  Musculoskeletal:        General: No swelling. Normal range of motion.     Cervical back: Neck supple.     Right lower leg: No edema.     Left lower leg: No edema.  Skin:    General: Skin is warm and dry.  Neurological:     General: No focal deficit present.     Mental Status: He is oriented to person, place, and time.     Coordination: Coordination normal.  Psychiatric:        Mood and Affect: Mood normal.     ED Results / Procedures / Treatments   Labs (all labs ordered are listed, but only abnormal results are  displayed) Labs Reviewed  CBC WITH DIFFERENTIAL/PLATELET - Abnormal; Notable for the following components:      Result Value   WBC 13.9 (*)    RBC 4.07 (*)    Neutro Abs 11.6 (*)    Abs Immature Granulocytes 0.10 (*)    All other components within normal limits  TROPONIN I (HIGH SENSITIVITY)    EKG EKG Interpretation  Date/Time:  Friday July 01 2020 10:56:32 EDT Ventricular Rate:  113 PR Interval:  196 QRS Duration: 86 QT Interval:  376 QTC Calculation: 515 R Axis:   -23 Text Interpretation: Sinus rhythm with frequent Premature ventricular complexes and Fusion complexes Low voltage QRS Borderline ECG t wave peaking more pronounced compared to older tracing Confirmed by Arby Barrette [phone]) on 07/01/2020 2:11:03 PM   Radiology No results found.  Procedures Procedures (including critical care time) CRITICAL CARE Performed by: Arby Barrette   Total critical care time: 30  minutes  Critical care time was exclusive of separately billable procedures and treating other patients.  Critical care was necessary to treat or prevent imminent or life-threatening deterioration.  Critical care was time spent personally by me on the following activities: development of treatment plan with patient and/or surrogate as well as nursing, discussions with consultants, evaluation of patient's response to treatment, examination of patient, obtaining history from patient or surrogate, ordering and performing treatments and interventions, ordering and review of laboratory studies, ordering and review of radiographic studies, pulse oximetry and re-evaluation of patient's condition. Medications Ordered in ED Medications - No data to display  ED Course  I have reviewed the triage vital signs and the nursing notes.  Pertinent labs & imaging results that were available during my care of the patient were reviewed by me and considered  in my medical decision making (see chart for details).    MDM  Rules/Calculators/A&P                         Patient presents with 6 weeks of symptoms consisting of postprandial emesis.  Intermittent diarrhea.  Symptoms worsening over the past several days.  Patient has been prescribed Protonix but has not been consistent taking it.  Ultrasound was done within the past several days no acute findings.  At this time we will proceed with further diagnostic evaluation.  Patient's labs returned with mild AKI and hyperkalemia.  EKGs show minimal increase T wave peaking as compared to older tracings.  Proceed with fluid hydration and Lokelma.  We will proceed with noncontrast chest and abdomen CT scan for 6 weeks of vomiting with burning epigastric and chest pain.  Dr. Lockie Molauratolo to follow-up on CT results for final disposition. Final Clinical Impression(s) / ED Diagnoses Final diagnoses:  AKI (acute kidney injury) (HCC)  Hyperkalemia  Non-intractable vomiting without nausea, unspecified vomiting type    Rx / DC Orders ED Discharge Orders    None       Arby BarrettePfeiffer, Tyquan Carmickle, MD 07/01/20 1511

## 2020-07-01 NOTE — Care Plan (Addendum)
[facility] Plan of Care Note  Patient: Joshua [name]    UDT:[id number]  PCP: Kaleen Mask, MD    DOB: Dec 13, 1935  DOS: 07/01/2020   Received a phone call from Facility: [facility] regarding transfer of Joshua [name]. Requesting: Dr. Lockie Mola Reason for transfer: inability to tolerate PO, persistent epigastric pain x 2 weeks History: COPD, T2DM, nephrolithiasis. Presented with n/v, epigastric pain ongoing for 2 weeks, tried [employer]'s powder to releave Vitals: Stable Labs: Cr 2.09( Cr 1.17), K 6.2, WBC 13.9, lipase wnl Exam: epigastric abdominal pain, slightly peaked T waves on EKG. CT abd shows severe gastritis ( slightly thickened esophagus), and 8 mm calculus at RUPJ that may be intermittently obstructive when patient erect per radiology read. No flank pain, UA wnl Treatment received: IVF, lokelma, IV protonix  Plan of care: The patient will be accepted for admission to [facility], telemetry unit for nausea/vomiting/ decreased ability to tolerate Po secondary to severe gastritis -IV protonix -IVF  AKI with hyperkalemia. S/p lokelma  -IVF - monitor on telemetry -EDP to give calcium gluconate   Requested the physician to give calcium gluconate for temporization.  Author: Roberto Scales, MD [facility] 07/01/2020  If 7PM-7AM, please contact night-coverage at [email or web address],

## 2020-07-01 NOTE — ED Triage Notes (Addendum)
Chest and abdominal pain with vomiting x 6 weeks.  One episode diarrhea.  Pt states his stomach swells up then goes down.  Burning with drinking coffee and cold drinks.  Pt states appetite is poor.

## 2020-07-01 NOTE — ED Provider Notes (Signed)
Assumed care of patient at 3 PM.  Patient with nausea and vomiting for the last several weeks but worse over the last 2 hours.  Difficulty tolerating p.o.  Having epigastric abdominal pain.  Patient hyperkalemic to 6.2.  Mildly peaked T waves and patient given Lokelma.  AKI with a creatinine greater than 2.  CT scan now showing severe gastritis.  Right-sided kidney stone at the UVJ but does not have ongoing right flank pain.  Not sure if this is incidental or if he is having intermittent symptoms.  Urinalysis negative for infection.  Given AKI, hyperkalemia, poor p.o. ability at this time and severe gastritis will admit.  Has been given IV Protonix, IV Zofran.  Has gotten fluid bolus.  Will admit and continue to monitor BMP while patient is here.  This chart was dictated using voice recognition software.  Despite best efforts to proofread,  errors can occur which can change the documentation meaning.      Virgina Norfolk, DO 07/01/20 1651

## 2020-07-02 LAB — BASIC METABOLIC PANEL
Anion gap: 10 (ref 5–15)
BUN: 35 mg/dL — ABNORMAL HIGH (ref 8–23)
CO2: 19 mmol/L — ABNORMAL LOW (ref 22–32)
Calcium: 8.6 mg/dL — ABNORMAL LOW (ref 8.9–10.3)
Chloride: 107 mmol/L (ref 98–111)
Creatinine, Ser: 1.67 mg/dL — ABNORMAL HIGH (ref 0.61–1.24)
GFR calc Af Amer: 44 mL/min — ABNORMAL LOW (ref >60)
GFR calc non Af Amer: 38 mL/min — ABNORMAL LOW (ref >60)
Glucose, Bld: 94 mg/dL (ref 70–99)
Potassium: 5.5 mmol/L — ABNORMAL HIGH (ref 3.5–5.1)
Sodium: 136 mmol/L (ref 135–145)

## 2020-07-02 MED ORDER — PANTOPRAZOLE SODIUM 40 MG PO TBEC
40.0000 mg | DELAYED_RELEASE_TABLET | Freq: Every day | ORAL | 0 refills | Status: DC
Start: 2020-07-02 — End: 2021-09-26

## 2020-07-02 MED ORDER — ONDANSETRON 4 MG PO TBDP: 4.0000 mg | ORAL_TABLET | Freq: Three times a day (TID) | ORAL | 0 refills | Status: DC | PRN

## 2020-07-02 NOTE — Discharge Instructions (Addendum)
Your potassium is still mildly elevated today at 5.5.  Your kidneys are also not functioning is strongly is normal.  It is important to drink plenty of fluids.  You need to STOP taking LISINOPRIL immediately.  It is important to follow-up with your primary care physician in 2 days for a recheck of your potassium and kidney function.  If at any point you feel worse or start having recurrent vomiting or new/worsening pain, or any other new/concerning symptoms then return to the ER for evaluation.  Your CT scan also showed a 3.3 cm abdominal aortic aneurysm.  You will need a follow-up imaging study of this in about 3 years.

## 2020-07-02 NOTE — ED Provider Notes (Signed)
10:24 AM I was asked to see patient as he has been waiting on a bed for over 18 hours and is wanting to go home.  Chart was reviewed.  He has had some on and off back pain but is okay right now.  Unclear if this kidney stone is currently causing some of his symptoms.  He also has gastritis.  His kidney injury is improving though his kidney function is not back to baseline.  His hyperkalemia is also better but still mildly elevated at 5.5.  I discussed options with family and they want to go home.  I think this is reasonable as he has improved.  I discussed risks of leaving such as worsening electrolyte disturbance or worsening/not improving kidney function.  Advised to drink plenty of fluids.  We will stop his lisinopril.  Advised to use lower potassium diet. Follow up with PCP in 2 days for recheck of BMP. Return here if changes mind or any other new/concerning symptoms.    Pricilla Loveless, MD 07/02/20 1041

## 2020-07-12 ENCOUNTER — Other Ambulatory Visit: Payer: Self-pay | Admitting: Urology

## 2020-07-12 ENCOUNTER — Other Ambulatory Visit (HOSPITAL_COMMUNITY)
Admission: RE | Admit: 2020-07-12 | Discharge: 2020-07-12 | Disposition: A | Discharge status: Home / Self Care | Payer: Medicare Other | Source: Ambulatory Visit | Attending: Urology | Admitting: Urology

## 2020-07-12 DIAGNOSIS — Z20822 Contact with and (suspected) exposure to covid-19: Secondary | ICD-10-CM | POA: Diagnosis not present

## 2020-07-12 DIAGNOSIS — Z01812 Encounter for preprocedural laboratory examination: Secondary | ICD-10-CM | POA: Insufficient documentation

## 2020-07-12 LAB — SARS CORONAVIRUS 2 (TAT 6-24 HRS): SARS Coronavirus 2: NEGATIVE

## 2020-07-12 NOTE — Progress Notes (Signed)
Patient to arrive at 0915 on 07/14/2020. History and medications reviewed. All pre-procedure instructions given.  NPO after MN, lopressor and norvasc with sip of water in am. Driver secured.

## 2020-07-13 NOTE — H&P (Signed)
CC: Right renal pelvic stone  HPI:  07/12/2020  84 year old male with a several week history of nausea and vomiting as well as chronic back pain. He is not really having much right-sided abdominal pain but he does have left-sided back pain and states that he has chronic back issues. He states he is ruptured 3 disc in the past. He went to the emergency department for a 6 week history of nausea and vomiting and inability to tolerate p.o.. This was on 07/01/2020. Imaging was performed that showed an 8 mm right renal pelvic calculus. There was no hydronephrosis. There was chronic renal cortical thinning bilaterally. He did have acute renal insufficiency that improved with fluid resuscitation. Urinalysis is negative today.     ALLERGIES: None   MEDICATIONS: Allopurinol 300 mg tablet  Metoprolol Tartrate  Simvastatin 20 mg tablet  Afrin  Albuterol Sulfate  Amlodipine Besylate 10 mg tablet  Gabapentin 100 mg capsule  Glipizide 10 mg tablet  Januvia  Loratadine 10 mg tablet  Methocarbamol 500 mg tablet  Nateglinide 120 mg tablet  Oxycodone Hcl 5 mg capsule  Pantoprazole Sodium 40 mg tablet, delayed release  Pioglitazone Hcl 15 mg tablet  Prednisone 20 mg tablet  Tramadol Hcl 50 mg tablet  Tylenol  Vitamin B12  Vitamin D3     GU PSH: None   NON-GU PSH: Knee Arthroscopy/surgery, Bilateral     GU PMH: None   NON-GU PMH: Acute gastric ulcer with hemorrhage Anxiety Arthritis Asthma COPD Depression Diabetes Type 2 GERD Gout Hypercholesterolemia Hypertension Sleep Apnea    FAMILY HISTORY: 3 daughters - Other   SOCIAL HISTORY: Marital Status: Married Preferred Language: English; Race: White Current Smoking Status: Patient does not smoke anymore.   Tobacco Use Assessment Completed: Used Tobacco in last 30 days? Drinks 2 caffeinated drinks per day.    REVIEW OF SYSTEMS:    GU Review Male:   Patient reports frequent urination, burning/ pain with urination, get up at night  to urinate, stream starts and stops, trouble starting your stream, have to strain to urinate , and erection problems. Patient denies hard to postpone urination, leakage of urine, and penile pain.  Gastrointestinal (Upper):   Patient reports nausea, vomiting, and indigestion/ heartburn.   Gastrointestinal (Lower):   Patient reports diarrhea. Patient denies constipation.  Constitutional:   Patient reports night sweats. Patient denies fever and fatigue.  Skin:   Patient reports skin rash/ lesion and itching.   Eyes:   Patient reports blurred vision and double vision.   Ears/ Nose/ Throat:   Patient reports sinus problems. Patient denies sore throat.  Hematologic/Lymphatic:   Patient reports easy bruising. Patient denies swollen glands.  Cardiovascular:   Patient reports leg swelling. Patient denies chest pains.  Respiratory:   Patient reports cough and shortness of breath.   Endocrine:   Patient reports excessive thirst.   Musculoskeletal:   Patient reports back pain and joint pain.   Neurological:   Patient reports headaches. Patient denies dizziness.  Psychologic:   Patient reports depression and anxiety.    Notes: UTI    VITAL SIGNS:      07/12/2020 09:38 AM  Weight 218 lb / 98.88 kg  Height 72 in / 182.88 cm  BP 163/68 mmHg  Heart Rate 73 /min  Temperature 97.8 F / 36.5 C  BMI 29.6 kg/m   MULTI-SYSTEM PHYSICAL EXAMINATION:    Constitutional: Well-nourished. No physical deformities. Normally developed. Good grooming.  Respiratory: No labored breathing, no use of accessory muscles.  Cardiovascular: Normal temperature, normal extremity pulses, no swelling, no varicosities.  Skin: No paleness, no jaundice, no cyanosis. No lesion, no ulcer, no rash.  Neurologic / Psychiatric: Oriented to time, oriented to place, oriented to person. No depression, no anxiety, no agitation.  Gastrointestinal: No mass, no tenderness, no rigidity, non obese abdomen.  Eyes: Normal conjunctivae. Normal  eyelids.  Musculoskeletal: Normal gait and station of head and neck.     Complexity of Data:  Source Of History:  Patient  Records Review:   Previous Doctor Records, Previous Hospital Records, Previous Patient Records  Urine Test Review:   Urinalysis  X-Ray Review: C.T. Abdomen/Pelvis: Reviewed Films. Reviewed Report. Discussed With Patient.     PROCEDURES:         KUB - [id number]  A single view of the abdomen is obtained.  8 mm renal pelvic stone redemonstrated on KUB. Smaller stone just lateral to this.      Patient confirmed No Neulasta OnPro Device.           Urinalysis - 81003 Dipstick Dipstick Cont'd  Color: Yellow Bilirubin: Neg  Appearance: Clear Ketones: Neg  Specific Gravity: 1.020 Blood: Neg  pH: 5.5 Protein: Neg  Glucose: Neg Urobilinogen: 0.2    Nitrites: Neg    Leukocyte Esterase: Neg    Notes:      ASSESSMENT:      ICD-10 Details  1 GU:   Renal calculus - N20.0 Undiagnosed New Problem  2   Flank Pain - R10.84 Undiagnosed New Problem   PLAN:           Orders X-Rays: KUB          Schedule         Document Letter(s):  Created for Patient: Clinical Summary         Notes:   We discussed the management of urinary stones. These options include observation, ureteroscopy, and shockwave lithotripsy. We discussed which options are relevant to these particular stones. We discussed the natural history of stones as well as the complications of untreated stones and the impact on quality of life without treatment as well as with each of the above listed treatments. We also discussed the efficacy of each treatment in its ability to clear the stone burden. With any of these management options I discussed the signs and symptoms of infection and the need for emergent treatment should these be experienced. For each option we discussed the ability of each procedure to clear the patient of their stone burden.   For observation I described the risks which include but are  not limited to silent renal damage, life-threatening infection, need for emergent surgery, failure to pass stone, and pain.   For ureteroscopy I described the risks which include heart attack, stroke, pulmonary embolus, death, bleeding, infection, damage to contiguous structures, positioning injury, ureteral stricture, ureteral avulsion, ureteral injury, need for ureteral stent, inability to perform ureteroscopy, need for an interval procedure, inability to clear stone burden, stent discomfort and pain.   For shockwave lithotripsy I described the risks which include arrhythmia, kidney contusion, kidney hemorrhage, need for transfusion, pain, inability to break up stone, inability to pass stone fragments, Steinstrasse, infection associated with obstructing stones, need for different surgical procedure, need for repeat shockwave lithotripsy.   Proceed with ESWL   cc: Dr Jeannetta Nap

## 2020-07-14 ENCOUNTER — Ambulatory Visit (HOSPITAL_COMMUNITY): Payer: Medicare Other

## 2020-07-14 ENCOUNTER — Ambulatory Visit (HOSPITAL_BASED_OUTPATIENT_CLINIC_OR_DEPARTMENT_OTHER)
Admission: RE | Admit: 2020-07-14 | Discharge: 2020-07-14 | Disposition: A | Discharge status: Home / Self Care | Payer: Medicare Other | Attending: Urology | Admitting: Urology

## 2020-07-14 ENCOUNTER — Encounter (HOSPITAL_BASED_OUTPATIENT_CLINIC_OR_DEPARTMENT_OTHER): Payer: Self-pay | Admitting: Urology

## 2020-07-14 ENCOUNTER — Encounter (HOSPITAL_BASED_OUTPATIENT_CLINIC_OR_DEPARTMENT_OTHER)
Admission: RE | Disposition: A | Discharge status: Home / Self Care | Payer: Self-pay | Source: Home / Self Care | Attending: Urology

## 2020-07-14 ENCOUNTER — Other Ambulatory Visit: Payer: Self-pay

## 2020-07-14 DIAGNOSIS — E7800 Pure hypercholesterolemia, unspecified: Secondary | ICD-10-CM | POA: Insufficient documentation

## 2020-07-14 DIAGNOSIS — G4730 Sleep apnea, unspecified: Secondary | ICD-10-CM | POA: Diagnosis not present

## 2020-07-14 DIAGNOSIS — Z7952 Long term (current) use of systemic steroids: Secondary | ICD-10-CM | POA: Diagnosis not present

## 2020-07-14 DIAGNOSIS — Z79891 Long term (current) use of opiate analgesic: Secondary | ICD-10-CM | POA: Insufficient documentation

## 2020-07-14 DIAGNOSIS — I10 Essential (primary) hypertension: Secondary | ICD-10-CM | POA: Diagnosis not present

## 2020-07-14 DIAGNOSIS — E119 Type 2 diabetes mellitus without complications: Secondary | ICD-10-CM | POA: Diagnosis not present

## 2020-07-14 DIAGNOSIS — Z7984 Long term (current) use of oral hypoglycemic drugs: Secondary | ICD-10-CM | POA: Insufficient documentation

## 2020-07-14 DIAGNOSIS — J449 Chronic obstructive pulmonary disease, unspecified: Secondary | ICD-10-CM | POA: Diagnosis not present

## 2020-07-14 DIAGNOSIS — N200 Calculus of kidney: Secondary | ICD-10-CM | POA: Insufficient documentation

## 2020-07-14 DIAGNOSIS — M109 Gout, unspecified: Secondary | ICD-10-CM | POA: Diagnosis not present

## 2020-07-14 DIAGNOSIS — K219 Gastro-esophageal reflux disease without esophagitis: Secondary | ICD-10-CM | POA: Diagnosis not present

## 2020-07-14 DIAGNOSIS — Z79899 Other long term (current) drug therapy: Secondary | ICD-10-CM | POA: Insufficient documentation

## 2020-07-14 HISTORY — PX: EXTRACORPOREAL SHOCK WAVE LITHOTRIPSY: SHX1557

## 2020-07-14 LAB — GLUCOSE, CAPILLARY: Glucose-Capillary: 147 mg/dL — ABNORMAL HIGH (ref 70–99)

## 2020-07-14 SURGERY — LITHOTRIPSY, ESWL
Anesthesia: LOCAL | Laterality: Right

## 2020-07-14 MED ORDER — DIAZEPAM 5 MG PO TABS
ORAL_TABLET | ORAL | Status: AC
  Filled 2020-07-14: qty 2

## 2020-07-14 MED ORDER — OXYCODONE HCL 5 MG PO TABS: 5.0000 mg | ORAL_TABLET | Freq: Four times a day (QID) | ORAL | 0 refills | Status: DC | PRN

## 2020-07-14 MED ORDER — CIPROFLOXACIN HCL 500 MG PO TABS
500.0000 mg | ORAL_TABLET | ORAL | Status: AC
  Administered 2020-07-14: 500 mg via ORAL

## 2020-07-14 MED ORDER — DIPHENHYDRAMINE HCL 25 MG PO CAPS
ORAL_CAPSULE | ORAL | Status: AC
  Filled 2020-07-14: qty 1

## 2020-07-14 MED ORDER — CIPROFLOXACIN HCL 500 MG PO TABS
ORAL_TABLET | ORAL | Status: AC
  Filled 2020-07-14: qty 1

## 2020-07-14 MED ORDER — DIPHENHYDRAMINE HCL 25 MG PO CAPS
25.0000 mg | ORAL_CAPSULE | ORAL | Status: AC
  Administered 2020-07-14: 25 mg via ORAL

## 2020-07-14 MED ORDER — DIAZEPAM 5 MG PO TABS
10.0000 mg | ORAL_TABLET | ORAL | Status: AC
  Administered 2020-07-14: 10 mg via ORAL

## 2020-07-14 MED ORDER — SODIUM CHLORIDE 0.9 % IV SOLN: INTRAVENOUS | Status: DC

## 2020-07-14 MED ORDER — SODIUM CHLORIDE 0.9% FLUSH: 3.0000 mL | Freq: Two times a day (BID) | INTRAVENOUS | Status: DC

## 2020-07-14 NOTE — Discharge Instructions (Signed)
Lithotripsy, Care After This sheet gives you information about how to care for yourself after your procedure. Your health care provider may also give you more specific instructions. If you have problems or questions, contact your health care provider. What can I expect after the procedure? After the procedure, it is common to have:  Some blood in your urine. This should only last for a few days.  Soreness in your back, sides, or upper abdomen for a few days.  Blotches or bruises on your back where the pressure wave entered the skin.  Pain, discomfort, or nausea when pieces (fragments) of the kidney stone move through the tube that carries urine from the kidney to the bladder (ureter). Stone fragments may pass soon after the procedure, but they may continue to pass for up to 4-8 weeks. ? If you have severe pain or nausea, contact your health care provider. This may be caused by a large stone that was not broken up, and this may mean that you need more treatment.  Some pain or discomfort during urination.  Some pain or discomfort in the lower abdomen or (in men) at the base of the penis. Follow these instructions at home: Medicines  Take over-the-counter and prescription medicines only as told by your health care provider.  If you were prescribed an antibiotic medicine, take it as told by your health care provider. Do not stop taking the antibiotic even if you start to feel better.  Do not drive for 24 hours if you were given a medicine to help you relax (sedative).  Do not drive or use heavy machinery while taking prescription pain medicine. Eating and drinking      Drink enough water and fluids to keep your urine clear or pale yellow. This helps any remaining pieces of the stone to pass. It can also help prevent new stones from forming.  Eat plenty of fresh fruits and vegetables.  Follow instructions from your health care provider about eating and drinking restrictions. You may be  instructed: ? To reduce how much salt (sodium) you eat or drink. Check ingredients and nutrition facts on packaged foods and beverages. ? To reduce how much meat you eat.  Eat the recommended amount of calcium for your age and gender. Ask your health care provider how much calcium you should have. General instructions  Get plenty of rest.  Most people can resume normal activities 1-2 days after the procedure. Ask your health care provider what activities are safe for you.  Your health care provider may direct you to lie in a certain position (postural drainage) and tap firmly (percuss) over your kidney area to help stone fragments pass. Follow instructions as told by your health care provider.  If directed, strain all urine through the strainer that was provided by your health care provider. ? Keep all fragments for your health care provider to see. Any stones that are found may be sent to a medical lab for examination. The stone may be as small as a grain of salt.  Keep all follow-up visits as told by your health care provider. This is important. Contact a health care provider if:  You have pain that is severe or does not get better with medicine.  You have nausea that is severe or does not go away.  You have blood in your urine longer than your health care provider told you to expect.  You have more blood in your urine.  You have pain during urination that does   not go away.  You urinate more frequently than usual and this does not go away.  You develop a rash or any other possible signs of an allergic reaction. Get help right away if:  You have severe pain in your back, sides, or upper abdomen.  You have severe pain while urinating.  Your urine is very dark red.  You have blood in your stool (feces).  You cannot pass any urine at all.  You feel a strong urge to urinate after emptying your bladder.  You have a fever or chills.  You develop shortness of breath,  difficulty breathing, or chest pain.  You have severe nausea that leads to persistent vomiting.  You faint. Summary  After this procedure, it is common to have some pain, discomfort, or nausea when pieces (fragments) of the kidney stone move through the tube that carries urine from the kidney to the bladder (ureter). If this pain or nausea is severe, however, you should contact your health care provider.  Most people can resume normal activities 1-2 days after the procedure. Ask your health care provider what activities are safe for you.  Drink enough water and fluids to keep your urine clear or pale yellow. This helps any remaining pieces of the stone to pass, and it can help prevent new stones from forming.  If directed, strain your urine and keep all fragments for your health care provider to see. Fragments or stones may be as small as a grain of salt.  Get help right away if you have severe pain in your back, sides, or upper abdomen or have severe pain while urinating. This information is not intended to replace advice given to you by your health care provider. Make sure you discuss any questions you have with your health care provider. Document Revised: 03/09/2019 Document Reviewed: 10/17/2016 Elsevier Patient Education  2020 [employer] Inc[employer]  

## 2020-07-14 NOTE — Interval H&P Note (Signed)
History and Physical Interval Note:  No change in right UPJ stone.   07/14/2020 11:10 AM  Joshua [name]  has presented today for surgery, with the diagnosis of RIGHT RENAL STONE.  The various methods of treatment have been discussed with the patient and family. After consideration of risks, benefits and other options for treatment, the patient has consented to  Procedure(s): EXTRACORPOREAL SHOCK WAVE LITHOTRIPSY (ESWL) (Right) as a surgical intervention.  The patient's history has been reviewed, patient examined, no change in status, stable for surgery.  I have reviewed the patient's chart and labs.  Questions were answered to the patient's satisfaction.     Bjorn Pippin

## 2020-07-18 ENCOUNTER — Encounter (HOSPITAL_BASED_OUTPATIENT_CLINIC_OR_DEPARTMENT_OTHER): Payer: Self-pay | Admitting: Urology

## 2020-08-31 ENCOUNTER — Other Ambulatory Visit: Payer: Self-pay | Admitting: Urology

## 2020-09-01 LAB — PSA: PSA: 0.7

## 2020-09-29 ENCOUNTER — Other Ambulatory Visit: Payer: Self-pay | Admitting: Urology

## 2020-09-30 LAB — PMP SCREEN PROFILE (10S), URINE
Amphetamine: NEGATIVE
Barbiturate: NEGATIVE
Benzodiazepines.: NEGATIVE
COCAINE: NEGATIVE
Cannabinoid: NEGATIVE
Creat: 116.9
Methadone: NEGATIVE
OXYCODONE: POSITIVE
Opiate: POSITIVE
Phencyclidine: NEGATIVE
Propoxyphene: NEGATIVE
pH: 5.4

## 2020-10-18 NOTE — Progress Notes (Signed)
[facility] - Clinic Note  10/19/2020     CHIEF COMPLAINT Patient presents for Retina Follow Up   HISTORY OF PRESENT ILLNESS: Joshua [name] is a 84 y.o. male who presents to the clinic today for:   HPI    Retina Follow Up    Patient presents with  Dry AMD.  In both eyes.  This started 6 months ago.  I, the attending physician,  performed the HPI with the patient and updated documentation appropriately.          Comments    Patient here for 6 month retina follow up for non exu ARMD OU. Patient states vision doing ok. Sometimes eye get sore. Cant see in regular room light- too dark. On Losartan, Probenecid, and taking wifes Ocuvite.       Last edited by Rennis Chris, MD on 10/19/2020  2:04 PM. (History)    pts vision is doing better, no new health concerns  Referring physician: Kaleen Mask, MD [address] [place],  [place] [address]  HISTORICAL INFORMATION:   Selected notes from the MEDICAL RECORD NUMBER Referral from Dr. Eugene Garnet for concern of ARMD OU   CURRENT MEDICATIONS: No current outpatient medications on file. (Ophthalmic Drugs)   No current facility-administered medications for this visit. (Ophthalmic Drugs)   Current Outpatient Medications (Other)  Medication Sig  . Acetaminophen (TYLENOL PO) Take by mouth.  Marland Kitchen albuterol (PROAIR HFA) 108 (90 Base) MCG/ACT inhaler Inhale 2 puffs into the lungs every 4 (four) hours as needed for wheezing or shortness of breath. (Patient not taking: Reported on 06/24/2019)  . allopurinol (ZYLOPRIM) 300 MG tablet Take 300 mg by mouth daily. 1/2 tablet daily  . amLODipine (NORVASC) 10 MG tablet Take 10 mg by mouth daily.  . Cholecalciferol (VITAMIN D3) 50 MCG (2000 UT) TABS Take 50 mcg by mouth daily at 3 pm.   . gabapentin (NEURONTIN) 100 MG capsule Take 1 capsule (100 mg total) by mouth 3 (three) times daily. 100 mg TID x 2 weeks, then 100 mg BID x 2 weeks, then 100 mg QD x 2 weeks.  (Patient not taking: Reported on 05/03/2020)  . glipiZIDE (GLUCOTROL XL) 10 MG 24 hr tablet Take 20 mg by mouth daily with breakfast.  . JANUVIA 100 MG tablet Take 100 mg by mouth daily.  Marland Kitchen loratadine (CLARITIN) 10 MG tablet Take 10 mg by mouth daily.  . methocarbamol (ROBAXIN) 500 MG tablet Take 1 tablet (500 mg total) by mouth every 6 (six) hours as needed for muscle spasms. (Patient not taking: Reported on 05/03/2020)  . metoprolol tartrate (LOPRESSOR) 100 MG tablet Take 100 mg by mouth daily.   . nateglinide (STARLIX) 120 MG tablet Take 120 mg by mouth 3 (three) times daily before meals.  . ondansetron (ZOFRAN ODT) 4 MG disintegrating tablet Take 1 tablet (4 mg total) by mouth every 8 (eight) hours as needed.  Marland Kitchen oxyCODONE (OXY IR/ROXICODONE) 5 MG immediate release tablet TAKE 1 TABLET BY MOUTH EVERY 6 HOURS AS NEEDED FOR SEVERE PAIN  . oxymetazoline (AFRIN) 0.05 % nasal spray Place 2 sprays into both nostrils at bedtime.  . pantoprazole (PROTONIX) 40 MG tablet Take 1 tablet (40 mg total) by mouth daily.  . pioglitazone (ACTOS) 15 MG tablet Take 15 mg by mouth daily.  . predniSONE (DELTASONE) 20 MG tablet Take 20 mg by mouth as directed.  . simvastatin (ZOCOR) 20 MG tablet Take 20 mg by mouth every evening.   Marland Kitchen  vitamin B-12 (CYANOCOBALAMIN) 1000 MCG tablet Take 1,000 mcg by mouth daily at 3 pm.   No current facility-administered medications for this visit. (Other)      REVIEW OF SYSTEMS: ROS    Positive for: Musculoskeletal, Endocrine, Eyes, Allergic/Imm   Negative for: Constitutional, Gastrointestinal, Neurological, Skin, Genitourinary, HENT, Cardiovascular, Respiratory, Psychiatric, Heme/Lymph   Last edited by Laddie Aquas, COA on 10/19/2020  1:30 PM. (History)       ALLERGIES No Known Allergies  PAST MEDICAL HISTORY Past Medical History:  Diagnosis Date  . Anemia    hx.  . Arthritis   . COPD (chronic obstructive pulmonary disease) (HCC)    reports at one poin he had  copd but he has stopped smoking and now doesnt have any trouble breathing or  need an inhaler   . Diabetes mellitus without complication (HCC)    type 2  . Headache    none recently   . High cholesterol   . History of kidney stones   . HOH (hard of hearing)    both ears  . Hypertension   . Pneumonia    years ago   Past Surgical History:  Procedure Laterality Date  . ANKLE FRACTURE SURGERY Left    when [age]- years old  . ANTERIOR CERVICAL DECOMP/DISCECTOMY FUSION N/A 07/11/2017   Procedure: Anterior Cervical Decompression/Discectomy Fusion  - Cervical three-four;  Surgeon: Donalee Citrin, MD;  Location: [facility] OR;  Service: Neurosurgery;  Laterality: N/A; limited extension of neck currently   . CATARACT EXTRACTION Bilateral   . EXTRACORPOREAL SHOCK WAVE LITHOTRIPSY Right 07/14/2020   Procedure: EXTRACORPOREAL SHOCK WAVE LITHOTRIPSY (ESWL);  Surgeon: Bjorn Pippin, MD;  Location: [facility];  Service: Urology;  Laterality: Right;  . FRACTURE SURGERY      fx. lower left leg at age 7-13  . TOTAL KNEE ARTHROPLASTY Right 11/11/2017   Procedure: RIGHT TOTAL KNEE ARTHROPLASTY;  Surgeon: Ollen Gross, MD;  Location: [facility] ORS;  Service: Orthopedics;  Laterality: Right;  . TOTAL KNEE ARTHROPLASTY Left 06/01/2019   Procedure: LEFT TOTAL KNEE ARTHROPLASTY;  Surgeon: Ollen Gross, MD;  Location: [facility];  Service: Orthopedics;  Laterality: Left;     FAMILY HISTORY Family History  Problem Relation Age of Onset  . Strabismus Daughter     SOCIAL HISTORY Social History   Tobacco Use  . Smoking status: Former Smoker    Packs/day: 2.00    Years: 60.00    Pack years: 120.00    Types: Cigarettes    Quit date: 07/10/2016    Years since quitting: 4.2  . Smokeless tobacco: Never Used  Vaping Use  . Vaping Use: Never used  Substance Use Topics  . Alcohol use: Never  . Drug use: Never         OPHTHALMIC EXAM:  Base Eye Exam    Visual Acuity (Snellen - Linear)      Right Left    Dist cc 20/20 20/30 -2   Dist ph cc  NI   Correction: Glasses       Tonometry (Tonopen, 1:25 PM)      Right Left   Pressure 14 14       Pupils      Dark Light Shape React APD   Right 4 3.5 Round Minimal None   Left 4 3 Round Brisk None       Visual Fields (Counting fingers)      Left Right    Full Full  Extraocular Movement      Right Left    Full, Ortho Full, Ortho       Neuro/Psych    Oriented x3: Yes   Mood/Affect: Normal       Dilation    Both eyes: 1.0% Mydriacyl, 2.5% Phenylephrine @ 1:25 PM        Slit Lamp and Fundus Exam    Slit Lamp Exam      Right Left   Lids/Lashes Dermatochalasis - upper lid Dermatochalasis - upper lid, Meibomian gland dysfunction   Conjunctiva/Sclera nasal and temporal Pinguecula temporal Pinguecula   Cornea subepithelial scarring inferior paracentral, Arcus, trace Punctate epithelial erosions Round subepithelial scarring nasal paracentral, Arcus, 1+ Punctate epithelial erosions   Anterior Chamber Deep and quiet Deep and quiet   Iris Round and moderately dilated Round and dilated   Lens Three piece Posterior chamber intraocular lens in good position, Open posterior capsule Three piece Posterior chamber intraocular lens in good position with open PC   Vitreous Vitreous syneresis, Posterior vitreous detachment Vitreous syneresis, Posterior vitreous detachment       Fundus Exam      Right Left   Disc Pink and Sharp, trace Pallor Pink and Sharp   C/D Ratio 0.5 0.5   Macula Flat, Blunted foveal reflex, +superior Drusen, RPE mottling and clumping, No heme or edema Flat, blunted foveal reflex, mild Drusen, no heme or edema, Retinal pigment epithelial mottling   Vessels Vascular attenuation, Tortuous, mild AV crossing changes Vascular attenuation, mild Tortuousity, mild AV crossing changes   Periphery attached, Superior pigmented lattice degeneration with excellent laser surrounding  attached, Superior pigmented lattice degeneration  with small atrophic holes - excellent laser surrounding        Refraction    Wearing Rx      Sphere Cylinder Axis Add   Right -0.25 Sphere  +2.50   Left -0.50 +0.50 140 +2.50          IMAGING AND PROCEDURES  Imaging and Procedures for 01/07/18  OCT, Retina - OU - Both Eyes       Right Eye Quality was good. Central Foveal Thickness: 253. Progression has been stable. Findings include normal foveal contour, no IRF, no SRF, retinal drusen .   Left Eye Quality was good. Central Foveal Thickness: 247. Progression has been stable. Findings include normal foveal contour, no IRF, no SRF, retinal drusen .   Notes Images taken, stored on drive  Diagnosis / Impression:  Mild Non-exudative ARMD OU  Clinical management:  See below  Abbreviations: NFP - Normal foveal profile. CME - cystoid macular edema. PED - pigment epithelial detachment. IRF - intraretinal fluid. SRF - subretinal fluid. EZ - ellipsoid zone. ERM - epiretinal membrane. ORA - outer retinal atrophy. ORT - outer retinal tubulation. SRHM - subretinal hyper-reflective material                  ASSESSMENT/PLAN:    ICD-10-CM   1. Lattice degeneration of both retinas  H35.413   2. Early dry stage nonexudative age-related macular degeneration of both eyes  H35.3131   3. Diabetes mellitus type 2 without retinopathy (HCC)  E11.9   4. Pseudophakia  Z96.1   5. Retinal edema  H35.81 OCT, Retina - OU - Both Eyes   1. Lattice degeneration OU-   - pigmented lattice superiorly with atrophic holes  - s/p laser retinopexy OS (11.13.18), s/p laser retinopexy OD (11.27.18)  - excellent laser in place OU  - no new RT/RD or  lattice  2. Age related macular degeneration, non-exudative, both eyes   - OCT relatively stable -- stable drusen  - The incidence, anatomy, and pathology of dry AMD, risk of progression, and the AREDS and AREDS 2 study including smoking risks discussed with patient.  - Recommend amsler grid  monitoring  - will continue to monitor  - f/u in 9 months, DFE, OCT  3. DM2 w/o retinopathy  - The incidence, risk factors for progression, natural history and treatment options for diabetic retinopathy  were discussed with patient.  The need for close monitoring of blood glucose, blood pressure, and serum lipids, avoiding cigarette or any type of tobacco, and the need for long term follow up was also discussed with patient.  - monitor  4. Pseudophakia OU  - s/p CE/IOL OU  - beautiful surgery, doing well  - monitor   Ophthalmic Meds Ordered this visit:  No orders of the defined types were placed in this encounter.      Return in about 9 months (around 07/19/2021) for f/u non-exu ARMD OU, DFE, OCT.  There are no Patient Instructions on file for this visit.   Explained the diagnoses, plan, and follow up with the patient and they expressed understanding.  Patient expressed understanding of the importance of proper follow up care.   This document serves as a record of services personally performed by Karie Chimera, MD, PhD. It was created on their behalf by Annalee Genta, COMT. The creation of this record is the provider's dictation and/or activities during the visit.  Electronically signed by: Annalee Genta, COMT 10/23/20 3:03 AM   This document serves as a record of services personally performed by Karie Chimera, MD, PhD. It was created on their behalf by Glee Arvin. Manson Passey, OA an ophthalmic technician. The creation of this record is the provider's dictation and/or activities during the visit.    Electronically signed by: Glee Arvin. [place], [place] 11.10.2021 3:03 AM  Karie Chimera, M.D., Ph.D. Diseases & Surgery of the Retina and Vitreous [facility]  I have reviewed the above documentation for accuracy and completeness, and I agree with the above. Karie Chimera, M.D., Ph.D. 10/23/20 3:03 AM  Abbreviations: M myopia (nearsighted); A astigmatism; H hyperopia  (farsighted); P presbyopia; Mrx spectacle prescription;  CTL contact lenses; OD right eye; OS left eye; OU both eyes  XT exotropia; ET esotropia; PEK punctate epithelial keratitis; PEE punctate epithelial erosions; DES dry eye syndrome; MGD meibomian gland dysfunction; ATs artificial tears; PFAT's preservative free artificial tears; NSC nuclear sclerotic cataract; PSC posterior subcapsular cataract; ERM epi-retinal membrane; PVD posterior vitreous detachment; RD retinal detachment; DM diabetes mellitus; DR diabetic retinopathy; NPDR non-proliferative diabetic retinopathy; PDR proliferative diabetic retinopathy; CSME clinically significant macular edema; DME diabetic macular edema; dbh dot blot hemorrhages; CWS cotton wool spot; POAG primary open angle glaucoma; C/D cup-to-disc ratio; HVF humphrey visual field; GVF goldmann visual field; OCT optical coherence tomography; IOP intraocular pressure; BRVO Branch retinal vein occlusion; CRVO central retinal vein occlusion; CRAO central retinal artery occlusion; BRAO branch retinal artery occlusion; RT retinal tear; SB scleral buckle; PPV pars plana vitrectomy; VH Vitreous hemorrhage; PRP panretinal laser photocoagulation; IVK intravitreal kenalog; VMT vitreomacular traction; MH Macular hole;  NVD neovascularization of the disc; NVE neovascularization elsewhere; AREDS age related eye disease study; ARMD age related macular degeneration; POAG primary open angle glaucoma; EBMD epithelial/anterior basement membrane dystrophy; ACIOL anterior chamber intraocular lens; IOL intraocular lens; PCIOL posterior chamber intraocular lens; Phaco/IOL  phacoemulsification with intraocular lens placement; Herreid photorefractive keratectomy; LASIK laser assisted in situ keratomileusis; HTN hypertension; DM diabetes mellitus; COPD chronic obstructive pulmonary disease

## 2020-10-19 ENCOUNTER — Ambulatory Visit (INDEPENDENT_AMBULATORY_CARE_PROVIDER_SITE_OTHER): Payer: Medicare Other | Admitting: Ophthalmology

## 2020-10-19 ENCOUNTER — Other Ambulatory Visit: Payer: Self-pay

## 2020-10-19 ENCOUNTER — Encounter (INDEPENDENT_AMBULATORY_CARE_PROVIDER_SITE_OTHER): Payer: Self-pay | Admitting: Ophthalmology

## 2020-10-19 DIAGNOSIS — H353131 Nonexudative age-related macular degeneration, bilateral, early dry stage: Secondary | ICD-10-CM | POA: Diagnosis not present

## 2020-10-19 DIAGNOSIS — Z961 Presence of intraocular lens: Secondary | ICD-10-CM | POA: Diagnosis not present

## 2020-10-19 DIAGNOSIS — H3581 Retinal edema: Secondary | ICD-10-CM

## 2020-10-19 DIAGNOSIS — H35413 Lattice degeneration of retina, bilateral: Secondary | ICD-10-CM

## 2020-10-19 DIAGNOSIS — E119 Type 2 diabetes mellitus without complications: Secondary | ICD-10-CM

## 2021-01-24 LAB — COMPREHENSIVE METABOLIC PANEL
ALBUMIN/GLOBULIN RATIO: 1.8
ALT(SGPT): 11 (ref 3–30)
AST: 14
Albumin: 4.2
Alkaline Phosphatase: 69
BUN/Creatinine Ratio: 13
BUN: 18
Bilirubin: 0.2
Calcium: 9.4
Carbon Dioxide, Total: 21
Chloride: 103
Creat: 1.42
Globulin, Total: 1.3
Glucose: 108
Potassium: 4.7
Sodium: 141
Total Protein: 6.5 g/dL
eGFR: 46

## 2021-01-24 LAB — CBC WITH DIFFERENTIAL
BASO(ABSOLUTE): 0
Basophils Absolute: 1
EOS (ABSOLUTE): 0.8
EOS: 10 %
HCT: 41 (ref 29–41)
Hemoglobin: 13.4
Immature Granulocytes: 0
Lymphs(Absolute): 1.4
MCH: 30.6
MCHC: 32.8
MCV: 93 (ref 76–111)
Monocytes(Absolute): 0.6
Monocytes: 8
Neutrophils Absolute: 5 /uL
Neutrophils: 63
RBC: 4.38 (ref 3.87–5.11)
RDW: 14
WBC: 7.7
lymph#: 18

## 2021-01-24 LAB — PMP SCREEN PROFILE (10S), URINE
Amphetamine: NEGATIVE
Barbiturate: NEGATIVE
Benzodiazepines.: NEGATIVE
Cannabinoid: NEGATIVE
Creat: 116.9
Methadone: NEGATIVE
OXYCODONE: NEGATIVE
Opiate: NEGATIVE
Phencyclidine: NEGATIVE
pH: 5.4

## 2021-01-24 LAB — HEMOGLOBIN A1C: Hemoglobin-A1c: 7

## 2021-01-25 ENCOUNTER — Ambulatory Visit
Admission: RE | Admit: 2021-01-25 | Discharge: 2021-01-25 | Disposition: A | Discharge status: Home / Self Care | Payer: Medicare Other | Source: Ambulatory Visit | Attending: Family Medicine | Admitting: Family Medicine

## 2021-01-25 ENCOUNTER — Other Ambulatory Visit: Payer: Self-pay | Admitting: Family Medicine

## 2021-01-25 DIAGNOSIS — G8929 Other chronic pain: Secondary | ICD-10-CM

## 2021-01-25 DIAGNOSIS — M549 Dorsalgia, unspecified: Secondary | ICD-10-CM

## 2021-04-11 LAB — COMPREHENSIVE METABOLIC PANEL
ALBUMIN/GLOBULIN RATIO: 1
ALT(SGPT): 13 (ref 3–30)
AST: 11
Albumin: 4.2
Alkaline Phosphatase: 69
BUN/Creatinine Ratio: 26
BUN: 38
Bilirubin: 0.2
Calcium: 9.2
Carbon Dioxide, Total: 20
Chloride: 103
Creat: 1.44
Globulin, Total: 2.2
Glucose: 178
Potassium: 4.5
Sodium: 142
Total Protein: 6.4 g/dL
eGFR: 48

## 2021-04-11 LAB — TSH: TSH: 1.91

## 2021-04-11 LAB — HEMOGLOBIN A1C: Hemoglobin-A1c: 7.4

## 2021-05-26 ENCOUNTER — Other Ambulatory Visit: Payer: Self-pay | Admitting: Family Medicine

## 2021-05-26 ENCOUNTER — Ambulatory Visit
Admission: RE | Admit: 2021-05-26 | Discharge: 2021-05-26 | Disposition: A | Discharge status: Home / Self Care | Payer: Medicare Other | Source: Ambulatory Visit | Attending: Family Medicine | Admitting: Family Medicine

## 2021-05-26 DIAGNOSIS — M549 Dorsalgia, unspecified: Secondary | ICD-10-CM

## 2021-05-26 LAB — COMPREHENSIVE METABOLIC PANEL
ALBUMIN/GLOBULIN RATIO: 1.8
ALT: 10 (ref 3–30)
AST: 12
Albumin: 4.2
Alkaline Phosphatase: 65
BUN/Creatinine Ratio: 16
BUN: 21
Bilirubin: 0.3
Calcium: 9.3
Carbon Dioxide, Total: 23
Chloride: 104
Creat: 1.35
Globulin, Total: 2.3
Glucose: 140
Potassium: 4.8
Sodium: 142
Total Protein: 6.5 g/dL
eGFR: 52

## 2021-05-26 LAB — PSA: PSA: 1.2

## 2021-05-26 LAB — CREATINE, SERUM: Creatine Kinase-Total: 51

## 2021-05-26 LAB — URIC ACID: Uric Acid: 8.8

## 2021-07-04 ENCOUNTER — Emergency Department (HOSPITAL_BASED_OUTPATIENT_CLINIC_OR_DEPARTMENT_OTHER)
Admission: EM | Admit: 2021-07-04 | Discharge: 2021-07-04 | Disposition: A | Discharge status: Home / Self Care | Payer: Medicare Other | Attending: Emergency Medicine | Admitting: Emergency Medicine

## 2021-07-04 ENCOUNTER — Emergency Department (HOSPITAL_BASED_OUTPATIENT_CLINIC_OR_DEPARTMENT_OTHER): Payer: Medicare Other

## 2021-07-04 ENCOUNTER — Other Ambulatory Visit: Payer: Self-pay

## 2021-07-04 ENCOUNTER — Encounter (HOSPITAL_BASED_OUTPATIENT_CLINIC_OR_DEPARTMENT_OTHER): Payer: Self-pay | Admitting: *Deleted

## 2021-07-04 DIAGNOSIS — E119 Type 2 diabetes mellitus without complications: Secondary | ICD-10-CM | POA: Diagnosis not present

## 2021-07-04 DIAGNOSIS — J449 Chronic obstructive pulmonary disease, unspecified: Secondary | ICD-10-CM | POA: Insufficient documentation

## 2021-07-04 DIAGNOSIS — Z96653 Presence of artificial knee joint, bilateral: Secondary | ICD-10-CM | POA: Insufficient documentation

## 2021-07-04 DIAGNOSIS — I10 Essential (primary) hypertension: Secondary | ICD-10-CM | POA: Insufficient documentation

## 2021-07-04 DIAGNOSIS — I714 Abdominal aortic aneurysm, without rupture, unspecified: Secondary | ICD-10-CM

## 2021-07-04 DIAGNOSIS — M5442 Lumbago with sciatica, left side: Secondary | ICD-10-CM | POA: Diagnosis not present

## 2021-07-04 DIAGNOSIS — Z0389 Encounter for observation for other suspected diseases and conditions ruled out: Secondary | ICD-10-CM | POA: Diagnosis present

## 2021-07-04 DIAGNOSIS — Z87891 Personal history of nicotine dependence: Secondary | ICD-10-CM | POA: Diagnosis not present

## 2021-07-04 DIAGNOSIS — M545 Low back pain, unspecified: Secondary | ICD-10-CM | POA: Diagnosis present

## 2021-07-04 LAB — RAPID URINE DRUG SCREEN, HOSP PERFORMED
Amphetamines: NOT DETECTED
Barbiturates: NOT DETECTED
Benzodiazepines: NOT DETECTED
Cocaine: NOT DETECTED
Opiates: NOT DETECTED
Tetrahydrocannabinol: NOT DETECTED

## 2021-07-04 LAB — URINALYSIS, MICROSCOPIC (REFLEX): WBC, UA: NONE SEEN WBC/hpf (ref 0–5)

## 2021-07-04 LAB — URINALYSIS, ROUTINE W REFLEX MICROSCOPIC
Bilirubin Urine: NEGATIVE
Glucose, UA: NEGATIVE mg/dL
Ketones, ur: NEGATIVE mg/dL
Leukocytes,Ua: NEGATIVE
Nitrite: NEGATIVE
Protein, ur: NEGATIVE mg/dL
Specific Gravity, Urine: 1.025 (ref 1.005–1.030)
pH: 6 (ref 5.0–8.0)

## 2021-07-04 NOTE — Discharge Instructions (Addendum)
You were seen in the emergency room today with back pain.  We performed a urine test which I would like you to take to your pharmacy to get the pain medication your primary care doctor prescribed.  Your CT scan does not show an obvious cause for your pain.  You do have slight enlargement of the blood vessel in your abdomen but this does not appear to be leaking or causing pain.  The radiology team is advising that you have follow-up to make sure this is not getting larger.  Please discuss this with your primary care doctor who can schedule follow-up imaging at the appropriate time.

## 2021-07-04 NOTE — ED Triage Notes (Signed)
Back pain with radiation down his right leg x 2 weeks. States he feels like he has sciatica.

## 2021-07-04 NOTE — ED Notes (Signed)
Patient reports left flank pain x 2 weeks, radiating down left leg.  Patient did have a fall from sitting in a chair around the same time.  Patient reports only urinating twice all day long and may be retaining urine.

## 2021-07-04 NOTE — ED Provider Notes (Signed)
Emergency Department Provider Note   I have reviewed the triage vital signs and the nursing notes.   HISTORY  Chief Complaint Back Pain   HPI Joshua [name] is a 85 y.o. male with PMH reviewed presents to the emergency department for evaluation of lower back pain radiating down the left leg.  Symptoms have been intermittent over the past 2 weeks but worsening over the past several days.  He did have a mechanical fall while trying to sit back in a chair which fell backwards 2 weeks ago.  Has had intermittent pain since then.  No numbness or weakness in the legs.  He states this feels similar to sciatica which she has had in the past.  He seen his primary care doctor but was required to submit a urine test to his pharmacy before they would fill the gabapentin which was prescribed by his PCP.  He has been unable to provide the test but presents today with worsening pain.  No groin numbness.  No bowel or bladder incontinence. No fever.    Past Medical History:  Diagnosis Date   Anemia    hx.   Arthritis    COPD (chronic obstructive pulmonary disease) (HCC)    reports at one poin he had copd but he has stopped smoking and now doesnt have any trouble breathing or  need an inhaler    Diabetes mellitus without complication (HCC)    type 2   Headache    none recently    High cholesterol    History of kidney stones    HOH (hard of hearing)    both ears   Hypertension    Pneumonia    years ago    Patient Active Problem List   Diagnosis Date Noted   Gastritis 07/01/2020   Abnormal CXR 08/01/2018   COPD (chronic obstructive pulmonary disease) (HCC) 08/01/2018   OA (osteoarthritis) of knee 11/11/2017   Myelopathy (HCC) 07/11/2017   Essential hypertension 04/30/2017   Diabetes mellitus type 2 in nonobese (HCC) 04/30/2017   Hyperlipidemia 04/30/2017   Right shoulder pain 04/30/2017    Past Surgical History:  Procedure Laterality Date   ANKLE FRACTURE SURGERY Left    when  67-19- years old   ANTERIOR CERVICAL DECOMP/DISCECTOMY FUSION N/A 07/11/2017   Procedure: Anterior Cervical Decompression/Discectomy Fusion  - Cervical three-four;  Surgeon: Donalee Citrin, MD;  Location: [facility] OR;  Service: Neurosurgery;  Laterality: N/A; limited extension of neck currently    CATARACT EXTRACTION Bilateral    EXTRACORPOREAL SHOCK WAVE LITHOTRIPSY Right 07/14/2020   Procedure: EXTRACORPOREAL SHOCK WAVE LITHOTRIPSY (ESWL);  Surgeon: Bjorn Pippin, MD;  Location: [facility];  Service: Urology;  Laterality: Right;   FRACTURE SURGERY      fx. lower left leg at age 79-13   TOTAL KNEE ARTHROPLASTY Right 11/11/2017   Procedure: RIGHT TOTAL KNEE ARTHROPLASTY;  Surgeon: Ollen Gross, MD;  Location: [facility];  Service: Orthopedics;  Laterality: Right;   TOTAL KNEE ARTHROPLASTY Left 06/01/2019   Procedure: LEFT TOTAL KNEE ARTHROPLASTY;  Surgeon: Ollen Gross, MD;  Location: [facility];  Service: Orthopedics;  Laterality: Left;     Allergies Patient has no known allergies.  Family History  Problem Relation Age of Onset   Strabismus Daughter     Social History Social History   Tobacco Use   Smoking status: Former    Packs/day: 2.00    Years: 60.00    Pack years: 120.00    Types: Cigarettes  Quit date: 07/10/2016    Years since quitting: 4.9   Smokeless tobacco: Never  Vaping Use   Vaping Use: Never used  Substance Use Topics   Alcohol use: Never   Drug use: Never    Review of Systems  Constitutional: No fever/chills Eyes: No visual changes. ENT: No sore throat. Cardiovascular: Denies chest pain. Respiratory: Denies shortness of breath. Gastrointestinal: No abdominal pain.  No nausea, no vomiting.  No diarrhea.  No constipation. Genitourinary: Negative for dysuria. Musculoskeletal: Positive back and left leg pain.  Skin: Negative for rash. Neurological: Negative for headaches, focal weakness or numbness.  10-point ROS otherwise  negative.  ____________________________________________   PHYSICAL EXAM:  VITAL SIGNS: ED Triage Vitals  Enc Vitals Group     BP 07/04/21 1452 (!) 158/86     Pulse Rate 07/04/21 1452 97     Resp 07/04/21 1452 20     Temp 07/04/21 1452 98.4 F (36.9 C)     Temp Source 07/04/21 1452 Oral     SpO2 07/04/21 1452 93 %     Weight 07/04/21 1451 214 lb 15.2 oz (97.5 kg)     Height 07/04/21 1451 6' (1.829 m)   Constitutional: Alert and oriented. Well appearing and in no acute distress. Eyes: Conjunctivae are normal.  Head: Atraumatic. Nose: No congestion/rhinnorhea. Mouth/Throat: Mucous membranes are moist.  Neck: No stridor.  Cardiovascular: Normal rate, regular rhythm. Good peripheral circulation. Grossly normal heart sounds.   Respiratory: Normal respiratory effort.  No retractions. Lungs CTAB. Gastrointestinal: Soft and nontender. No distention.  Musculoskeletal: No lower extremity tenderness nor edema. No gross deformities of extremities.  Pain with straight leg raise test on the left.  Neurologic:  Normal speech and language. No gross focal neurologic deficits are appreciated.  Normal sensation and strength in the bilateral lower extremities.  Skin:  Skin is warm, dry and intact. No rash noted.  ____________________________________________   LABS (all labs ordered are listed, but only abnormal results are displayed)  Labs Reviewed  URINALYSIS, ROUTINE W REFLEX MICROSCOPIC - Abnormal; Notable for the following components:      Result Value   Hgb urine dipstick MODERATE (*)    All other components within normal limits  URINALYSIS, MICROSCOPIC (REFLEX) - Abnormal; Notable for the following components:   Bacteria, UA RARE (*)    All other components within normal limits  RAPID URINE DRUG SCREEN, HOSP PERFORMED  ____________________________________  RADIOLOGY  CT imaging reviewed.   ____________________________________________   PROCEDURES  Procedure(s) performed:    Procedures  None  ____________________________________________   INITIAL IMPRESSION / ASSESSMENT AND PLAN / ED COURSE  Pertinent labs & imaging results that were available during my care of the patient were reviewed by me and considered in my medical decision making (see chart for details).   Patient presents emergency department with lower back pain radiating down the left leg.  Seems consistent with uncomplicated sciatica.  He is not having neurodeficits to strongly suspect severe cord compression, cauda equina, or other acute spine emergency.  I do plan for CT imaging to evaluate for possible kidney stone versus lumbar spine fracture.  Will send a urine drug screen to help with patient getting his gabapentin filled at the pharmacy.  He was told by the pharmacy that they would need that test prior to filling his gabapentin.  CT reviewed. Mild AAA at 3.3 cm but no secondary changes to suspect rupture. Patient's exam and history are not suspicion for AAA rupture or dissection. Patient's  UA and Utox results provided for presentation at the pharmacy which apparently was keeping them from filling the Gabapentin. This seems like a reasonable start for pain mgmt and PCP follow up. Discussed ED return precautions with patient and daughter at bedside.  ____________________________________________  FINAL CLINICAL IMPRESSION(S) / ED DIAGNOSES  Final diagnoses:  Acute midline low back pain with left-sided sciatica  Abdominal aortic aneurysm (AAA) without rupture (HCC)      Note:  This document was prepared using Dragon voice recognition software and may include unintentional dictation errors.  Alona Bene, MD, [facility] Emergency Medicine    Neysha Criado, Arlyss Repress, MD 07/05/21 2051

## 2021-07-19 ENCOUNTER — Encounter (INDEPENDENT_AMBULATORY_CARE_PROVIDER_SITE_OTHER): Payer: Medicare Other | Admitting: Ophthalmology

## 2021-07-19 DIAGNOSIS — H353131 Nonexudative age-related macular degeneration, bilateral, early dry stage: Secondary | ICD-10-CM

## 2021-07-19 DIAGNOSIS — H35413 Lattice degeneration of retina, bilateral: Secondary | ICD-10-CM

## 2021-07-19 DIAGNOSIS — E119 Type 2 diabetes mellitus without complications: Secondary | ICD-10-CM

## 2021-07-19 DIAGNOSIS — H3581 Retinal edema: Secondary | ICD-10-CM

## 2021-07-19 DIAGNOSIS — Z961 Presence of intraocular lens: Secondary | ICD-10-CM

## 2021-09-23 ENCOUNTER — Encounter (HOSPITAL_COMMUNITY): Payer: Self-pay | Admitting: *Deleted

## 2021-09-23 ENCOUNTER — Inpatient Hospital Stay (HOSPITAL_COMMUNITY)
Admission: EM | Admit: 2021-09-23 | Discharge: 2021-09-29 | DRG: 417 | Disposition: A | Discharge status: Home Health | Payer: Medicare Other | Attending: Family Medicine | Admitting: Family Medicine

## 2021-09-23 ENCOUNTER — Emergency Department (HOSPITAL_COMMUNITY): Payer: Medicare Other

## 2021-09-23 ENCOUNTER — Other Ambulatory Visit: Payer: Self-pay

## 2021-09-23 DIAGNOSIS — R931 Abnormal findings on diagnostic imaging of heart and coronary circulation: Secondary | ICD-10-CM

## 2021-09-23 DIAGNOSIS — I82439 Acute embolism and thrombosis of unspecified popliteal vein: Secondary | ICD-10-CM

## 2021-09-23 DIAGNOSIS — N179 Acute kidney failure, unspecified: Secondary | ICD-10-CM | POA: Diagnosis not present

## 2021-09-23 DIAGNOSIS — Z7984 Long term (current) use of oral hypoglycemic drugs: Secondary | ICD-10-CM

## 2021-09-23 DIAGNOSIS — K429 Umbilical hernia without obstruction or gangrene: Secondary | ICD-10-CM | POA: Diagnosis present

## 2021-09-23 DIAGNOSIS — K819 Cholecystitis, unspecified: Secondary | ICD-10-CM | POA: Diagnosis present

## 2021-09-23 DIAGNOSIS — Z87891 Personal history of nicotine dependence: Secondary | ICD-10-CM

## 2021-09-23 DIAGNOSIS — A419 Sepsis, unspecified organism: Secondary | ICD-10-CM

## 2021-09-23 DIAGNOSIS — M545 Low back pain, unspecified: Secondary | ICD-10-CM | POA: Diagnosis present

## 2021-09-23 DIAGNOSIS — Z9181 History of falling: Secondary | ICD-10-CM

## 2021-09-23 DIAGNOSIS — R1011 Right upper quadrant pain: Secondary | ICD-10-CM

## 2021-09-23 DIAGNOSIS — R410 Disorientation, unspecified: Secondary | ICD-10-CM | POA: Diagnosis not present

## 2021-09-23 DIAGNOSIS — J449 Chronic obstructive pulmonary disease, unspecified: Secondary | ICD-10-CM | POA: Diagnosis present

## 2021-09-23 DIAGNOSIS — J9601 Acute respiratory failure with hypoxia: Secondary | ICD-10-CM | POA: Diagnosis present

## 2021-09-23 DIAGNOSIS — Z981 Arthrodesis status: Secondary | ICD-10-CM

## 2021-09-23 DIAGNOSIS — R0902 Hypoxemia: Secondary | ICD-10-CM

## 2021-09-23 DIAGNOSIS — E876 Hypokalemia: Secondary | ICD-10-CM | POA: Diagnosis not present

## 2021-09-23 DIAGNOSIS — R9431 Abnormal electrocardiogram [ECG] [EKG]: Secondary | ICD-10-CM

## 2021-09-23 DIAGNOSIS — R451 Restlessness and agitation: Secondary | ICD-10-CM | POA: Diagnosis not present

## 2021-09-23 DIAGNOSIS — I714 Abdominal aortic aneurysm, without rupture, unspecified: Secondary | ICD-10-CM | POA: Diagnosis present

## 2021-09-23 DIAGNOSIS — I10 Essential (primary) hypertension: Secondary | ICD-10-CM | POA: Diagnosis present

## 2021-09-23 DIAGNOSIS — J189 Pneumonia, unspecified organism: Secondary | ICD-10-CM | POA: Diagnosis present

## 2021-09-23 DIAGNOSIS — R296 Repeated falls: Secondary | ICD-10-CM | POA: Diagnosis present

## 2021-09-23 DIAGNOSIS — I152 Hypertension secondary to endocrine disorders: Secondary | ICD-10-CM

## 2021-09-23 DIAGNOSIS — R000 Tachycardia, unspecified: Secondary | ICD-10-CM

## 2021-09-23 DIAGNOSIS — K82A1 Gangrene of gallbladder in cholecystitis: Secondary | ICD-10-CM | POA: Diagnosis present

## 2021-09-23 DIAGNOSIS — E1159 Type 2 diabetes mellitus with other circulatory complications: Secondary | ICD-10-CM

## 2021-09-23 DIAGNOSIS — K810 Acute cholecystitis: Secondary | ICD-10-CM | POA: Diagnosis not present

## 2021-09-23 DIAGNOSIS — E119 Type 2 diabetes mellitus without complications: Secondary | ICD-10-CM

## 2021-09-23 DIAGNOSIS — R0602 Shortness of breath: Secondary | ICD-10-CM

## 2021-09-23 DIAGNOSIS — I82532 Chronic embolism and thrombosis of left popliteal vein: Secondary | ICD-10-CM | POA: Diagnosis present

## 2021-09-23 DIAGNOSIS — Z96653 Presence of artificial knee joint, bilateral: Secondary | ICD-10-CM | POA: Diagnosis present

## 2021-09-23 DIAGNOSIS — Z20822 Contact with and (suspected) exposure to covid-19: Secondary | ICD-10-CM | POA: Diagnosis present

## 2021-09-23 DIAGNOSIS — K219 Gastro-esophageal reflux disease without esophagitis: Secondary | ICD-10-CM | POA: Diagnosis present

## 2021-09-23 DIAGNOSIS — G8929 Other chronic pain: Secondary | ICD-10-CM | POA: Diagnosis present

## 2021-09-23 DIAGNOSIS — Z79899 Other long term (current) drug therapy: Secondary | ICD-10-CM

## 2021-09-23 DIAGNOSIS — R1010 Upper abdominal pain, unspecified: Secondary | ICD-10-CM

## 2021-09-23 DIAGNOSIS — E1165 Type 2 diabetes mellitus with hyperglycemia: Secondary | ICD-10-CM

## 2021-09-23 LAB — CBC WITH DIFFERENTIAL/PLATELET
Abs Immature Granulocytes: 0.08 10*3/uL — ABNORMAL HIGH (ref 0.00–0.07)
Basophils Absolute: 0 10*3/uL (ref 0.0–0.1)
Basophils Relative: 0 %
Eosinophils Absolute: 0 10*3/uL (ref 0.0–0.5)
Eosinophils Relative: 0 %
HCT: 48.2 % (ref 39.0–52.0)
Hemoglobin: 15.7 g/dL (ref 13.0–17.0)
Immature Granulocytes: 0 %
Lymphocytes Relative: 1 %
Lymphs Abs: 0.2 10*3/uL — ABNORMAL LOW (ref 0.7–4.0)
MCH: 31 pg (ref 26.0–34.0)
MCHC: 32.6 g/dL (ref 30.0–36.0)
MCV: 95.1 fL (ref 80.0–100.0)
Monocytes Absolute: 1 10*3/uL (ref 0.1–1.0)
Monocytes Relative: 6 %
Neutro Abs: 16.6 10*3/uL — ABNORMAL HIGH (ref 1.7–7.7)
Neutrophils Relative %: 93 %
Platelets: 201 10*3/uL (ref 150–400)
RBC: 5.07 MIL/uL (ref 4.22–5.81)
RDW: 14 % (ref 11.5–15.5)
WBC: 18 10*3/uL — ABNORMAL HIGH (ref 4.0–10.5)
nRBC: 0 % (ref 0.0–0.2)

## 2021-09-23 LAB — COMPREHENSIVE METABOLIC PANEL
ALT: 54 U/L — ABNORMAL HIGH (ref 0–44)
AST: 46 U/L — ABNORMAL HIGH (ref 15–41)
Albumin: 3.5 g/dL (ref 3.5–5.0)
Alkaline Phosphatase: 62 U/L (ref 38–126)
Anion gap: 12 (ref 5–15)
BUN: 17 mg/dL (ref 8–23)
CO2: 27 mmol/L (ref 22–32)
Calcium: 9 mg/dL (ref 8.9–10.3)
Chloride: 101 mmol/L (ref 98–111)
Creatinine, Ser: 1.36 mg/dL — ABNORMAL HIGH (ref 0.61–1.24)
GFR, Estimated: 52 mL/min — ABNORMAL LOW (ref >60)
Glucose, Bld: 231 mg/dL — ABNORMAL HIGH (ref 70–99)
Potassium: 3.9 mmol/L (ref 3.5–5.1)
Sodium: 140 mmol/L (ref 135–145)
Total Bilirubin: 0.9 mg/dL (ref 0.3–1.2)
Total Protein: 6.8 g/dL (ref 6.5–8.1)

## 2021-09-23 LAB — RESP PANEL BY RT-PCR (FLU A&B, COVID) ARPGX2
Influenza A by PCR: NEGATIVE
Influenza B by PCR: NEGATIVE
SARS Coronavirus 2 by RT PCR: NEGATIVE

## 2021-09-23 LAB — CBG MONITORING, ED: Glucose-Capillary: 219 mg/dL — ABNORMAL HIGH (ref 70–99)

## 2021-09-23 LAB — LACTIC ACID, PLASMA: Lactic Acid, Venous: 2 mmol/L (ref 0.5–1.9)

## 2021-09-23 LAB — APTT: aPTT: 25 seconds (ref 24–36)

## 2021-09-23 LAB — PROTIME-INR
INR: 0.9 (ref 0.8–1.2)
Prothrombin Time: 12.1 seconds (ref 11.4–15.2)

## 2021-09-23 MED ORDER — IOHEXOL 300 MG/ML  SOLN
100.0000 mL | Freq: Once | INTRAMUSCULAR | Status: AC | PRN
  Administered 2021-09-23: 100 mL via INTRAVENOUS

## 2021-09-23 MED ORDER — SODIUM CHLORIDE 0.9 % IV BOLUS
500.0000 mL | Freq: Once | INTRAVENOUS | Status: AC
  Administered 2021-09-23: 500 mL via INTRAVENOUS

## 2021-09-23 MED ORDER — FENTANYL CITRATE PF 50 MCG/ML IJ SOSY
50.0000 ug | PREFILLED_SYRINGE | Freq: Once | INTRAMUSCULAR | Status: AC
  Administered 2021-09-23: 50 ug via INTRAVENOUS
  Filled 2021-09-23: qty 1

## 2021-09-23 MED ORDER — ACETAMINOPHEN 500 MG PO TABS
1000.0000 mg | ORAL_TABLET | Freq: Once | ORAL | Status: AC
  Administered 2021-09-23: 1000 mg via ORAL
  Filled 2021-09-23: qty 2

## 2021-09-23 NOTE — ED Notes (Signed)
PT placed on 2LPM [place] after obtaining baseline vitals. PT satting at 93% upon doing so

## 2021-09-23 NOTE — ED Notes (Signed)
Patient transported to CT 

## 2021-09-23 NOTE — ED Provider Notes (Signed)
[facility] EMERGENCY DEPARTMENT Provider Note   CSN: [id number] Arrival date & time: 09/23/21  1910     History No chief complaint on file.   Joshua [name] is a 85 y.o. male.  Patient who with history of diabetes, COPD, no previous abdominal surgery --presents to the emergency department for evaluation of nausea, vomiting, diffuse abdominal pain that started early this morning.  He states that he felt well yesterday.  Last ate [facility] yesterday.  Patient has had multiple episodes of nonbloody, nonbilious vomiting.  Question some black coloration to the vomit.  He states that he had a small amount of "diarrhea" today but he did not bother to look to see if it was black or bloody.  He denies fever.  He has had a bit of a cough.  Denies back pain or chest pain.  No shortness of breath.  He does not typically wear oxygen but was placed on 2 L when he was 89% on arrival on room air.      Past Medical History:  Diagnosis Date   Anemia    hx.   Arthritis    COPD (chronic obstructive pulmonary disease) (HCC)    reports at one poin he had copd but he has stopped smoking and now doesnt have any trouble breathing or  need an inhaler    Diabetes mellitus without complication (HCC)    type 2   Headache    none recently    High cholesterol    History of kidney stones    HOH (hard of hearing)    both ears   Hypertension    Pneumonia    years ago    Patient Active Problem List   Diagnosis Date Noted   Gastritis 07/01/2020   Abnormal CXR 08/01/2018   COPD (chronic obstructive pulmonary disease) (HCC) 08/01/2018   OA (osteoarthritis) of knee 11/11/2017   Myelopathy (HCC) 07/11/2017   Essential hypertension 04/30/2017   Diabetes mellitus type 2 in nonobese (HCC) 04/30/2017   Hyperlipidemia 04/30/2017   Right shoulder pain 04/30/2017    Past Surgical History:  Procedure Laterality Date   ANKLE FRACTURE SURGERY Left    when 85-4- years old   ANTERIOR CERVICAL  DECOMP/DISCECTOMY FUSION N/A 07/11/2017   Procedure: Anterior Cervical Decompression/Discectomy Fusion  - Cervical three-four;  Surgeon: Donalee Citrin, MD;  Location: [facility] OR;  Service: Neurosurgery;  Laterality: N/A; limited extension of neck currently    CATARACT EXTRACTION Bilateral    EXTRACORPOREAL SHOCK WAVE LITHOTRIPSY Right 07/14/2020   Procedure: EXTRACORPOREAL SHOCK WAVE LITHOTRIPSY (ESWL);  Surgeon: Bjorn Pippin, MD;  Location: [facility];  Service: Urology;  Laterality: Right;   FRACTURE SURGERY      fx. lower left leg at age 61-13   TOTAL KNEE ARTHROPLASTY Right 11/11/2017   Procedure: RIGHT TOTAL KNEE ARTHROPLASTY;  Surgeon: Ollen Gross, MD;  Location: [facility];  Service: Orthopedics;  Laterality: Right;   TOTAL KNEE ARTHROPLASTY Left 06/01/2019   Procedure: LEFT TOTAL KNEE ARTHROPLASTY;  Surgeon: Ollen Gross, MD;  Location: [facility];  Service: Orthopedics;  Laterality: Left;        Family History  Problem Relation Age of Onset   Strabismus Daughter     Social History   Tobacco Use   Smoking status: Former    Packs/day: 2.00    Years: 60.00    Pack years: 120.00    Types: Cigarettes    Quit date: 07/10/2016    Years since quitting:  5.2   Smokeless tobacco: Never  Vaping Use   Vaping Use: Never used  Substance Use Topics   Alcohol use: Never   Drug use: Never    Home Medications Prior to Admission medications   Medication Sig Start Date End Date Taking? Authorizing Provider  Acetaminophen (TYLENOL PO) Take by mouth.    [provider]  albuterol (PROAIR HFA) 108 (90 Base) MCG/ACT inhaler Inhale 2 puffs into the lungs every 4 (four) hours as needed for wheezing or shortness of breath. Patient not taking: Reported on 06/24/2019 08/01/18   Leslye Peer, MD  allopurinol (ZYLOPRIM) 300 MG tablet Take 300 mg by mouth daily. 1/2 tablet daily    [provider]  amLODipine (NORVASC) 10 MG tablet Take 10 mg by mouth daily. 09/26/16    [provider]  Cholecalciferol (VITAMIN D3) 50 MCG (2000 UT) TABS Take 50 mcg by mouth daily at 3 pm.     [provider]  gabapentin (NEURONTIN) 100 MG capsule Take 1 capsule (100 mg total) by mouth 3 (three) times daily. 100 mg TID x 2 weeks, then 100 mg BID x 2 weeks, then 100 mg QD x 2 weeks. Patient not taking: Reported on 05/03/2020 06/03/19   Edmisten, Danford Bad L, PA  glipiZIDE (GLUCOTROL XL) 10 MG 24 hr tablet Take 20 mg by mouth daily with breakfast. 01/02/19   [provider]  JANUVIA 100 MG tablet Take 100 mg by mouth daily. 05/02/20   [provider]  loratadine (CLARITIN) 10 MG tablet Take 10 mg by mouth daily.    [provider]  methocarbamol (ROBAXIN) 500 MG tablet Take 1 tablet (500 mg total) by mouth every 6 (six) hours as needed for muscle spasms. Patient not taking: Reported on 05/03/2020 06/03/19   Arther Abbott L, PA  metoprolol tartrate (LOPRESSOR) 100 MG tablet Take 100 mg by mouth daily.     [provider]  nateglinide (STARLIX) 120 MG tablet Take 120 mg by mouth 3 (three) times daily before meals. 02/09/19   [provider]  ondansetron (ZOFRAN ODT) 4 MG disintegrating tablet Take 1 tablet (4 mg total) by mouth every 8 (eight) hours as needed. 07/02/20   Pricilla Loveless, MD  oxyCODONE (OXY IR/ROXICODONE) 5 MG immediate release tablet TAKE 1 TABLET BY MOUTH EVERY 6 HOURS AS NEEDED FOR SEVERE PAIN 08/31/20   Bjorn Pippin, MD  oxymetazoline (AFRIN) 0.05 % nasal spray Place 2 sprays into both nostrils at bedtime.    [provider]  pantoprazole (PROTONIX) 40 MG tablet Take 1 tablet (40 mg total) by mouth daily. 07/02/20   Pricilla Loveless, MD  pioglitazone (ACTOS) 15 MG tablet Take 15 mg by mouth daily.    [provider]  predniSONE (DELTASONE) 20 MG tablet Take 20 mg by mouth as directed.    [provider]  simvastatin (ZOCOR) 20 MG tablet Take 20 mg by mouth every evening.     [provider]  vitamin B-12 (CYANOCOBALAMIN) 1000 MCG tablet Take 1,000 mcg by mouth daily at 3 pm.    [provider]    Allergies    Patient has no known allergies.  Review of Systems   Review of Systems  Constitutional:  Negative for fever.  HENT:  Negative for rhinorrhea and sore throat.   Eyes:  Negative for redness.  Respiratory:  Positive for cough. Negative for shortness of breath.   Cardiovascular:  Negative for chest pain.  Gastrointestinal:  Positive for  abdominal pain, diarrhea, nausea and vomiting. Negative for constipation.  Genitourinary:  Negative for dysuria and hematuria.  Musculoskeletal:  Negative for myalgias.  Skin:  Negative for rash.  Neurological:  Negative for headaches.   Physical Exam Updated Vital Signs BP (!) 184/147   Pulse 99   Temp 100.1 F (37.8 C) (Oral)   Resp (!) 24   SpO2 93%   Physical Exam Vitals and nursing note reviewed.  Constitutional:      General: He is not in acute distress.    Appearance: He is well-developed.  HENT:     Head: Normocephalic and atraumatic.  Eyes:     General:        Right eye: No discharge.        Left eye: No discharge.     Conjunctiva/sclera: Conjunctivae normal.  Cardiovascular:     Rate and Rhythm: Normal rate and regular rhythm.     Heart sounds: Normal heart sounds.  Pulmonary:     Effort: Pulmonary effort is normal.     Breath sounds: Normal breath sounds.  Abdominal:     Palpations: Abdomen is soft.     Tenderness: There is abdominal tenderness. There is guarding. There is no rebound.     Comments: Moderate tenderness over the mid abdomen.  Musculoskeletal:     Cervical back: Normal range of motion and neck supple.  Skin:    General: Skin is warm and dry.  Neurological:     Mental Status: He is alert.    ED Results / Procedures / Treatments   Labs (all labs ordered are listed, but only abnormal results are displayed) Labs Reviewed  LACTIC ACID, PLASMA - Abnormal; Notable  for the following components:      Result Value   Lactic Acid, Venous 2.0 (*)    All other components within normal limits  COMPREHENSIVE METABOLIC PANEL - Abnormal; Notable for the following components:   Glucose, Bld 231 (*)    Creatinine, Ser 1.36 (*)    AST 46 (*)    ALT 54 (*)    GFR, Estimated 52 (*)    All other components within normal limits  CBC WITH DIFFERENTIAL/PLATELET - Abnormal; Notable for the following components:   WBC 18.0 (*)    Neutro Abs 16.6 (*)    Lymphs Abs 0.2 (*)    Abs Immature Granulocytes 0.08 (*)    All other components within normal limits  CBG MONITORING, ED - Abnormal; Notable for the following components:   Glucose-Capillary 219 (*)    All other components within normal limits  RESP PANEL BY RT-PCR (FLU A&B, COVID) ARPGX2  CULTURE, BLOOD (ROUTINE X 2)  CULTURE, BLOOD (ROUTINE X 2)  URINE CULTURE  PROTIME-INR  APTT  LACTIC ACID, PLASMA  URINALYSIS, ROUTINE W REFLEX MICROSCOPIC    EKG EKG Interpretation  Date/Time:  Saturday September 23 2021 20:08:30 EDT Ventricular Rate:  99 PR Interval:  172 QRS Duration: 90 QT Interval:  338 QTC Calculation: 433 R Axis:   -43 Text Interpretation: Normal sinus rhythm Left axis deviation Low voltage QRS Inferior infarct , age undetermined Cannot rule out Anterior infarct , age undetermined Abnormal ECG Confirmed by Drema Pry [phone]) on 09/23/2021 11:23:41 PM  Radiology DG Chest 1 View  Result Date: 09/23/2021 CLINICAL DATA:  Abdominal pain, vomiting EXAM: CHEST  1 VIEW COMPARISON:  06/14/2020 FINDINGS: Mild patchy bilateral lower lobe opacities, some of which may reflect scarring in the left lower lobe,, with superimposed bilateral lower  lobe atelectasis favored over pneumonia. No frank interstitial edema. No pleural effusion or pneumothorax. Heart is normal in size. IMPRESSION: Bilateral lower lobe opacities, favoring scarring/atelectasis. Electronically Signed   By: Charline Bills M.D.   On:  09/23/2021 20:48   CT ABDOMEN PELVIS W CONTRAST  Result Date: 09/23/2021 CLINICAL DATA:  Abdominal pain with vomiting and diarrhea for 2 days, initial encounter EXAM: CT ABDOMEN AND PELVIS WITH CONTRAST TECHNIQUE: Multidetector CT imaging of the abdomen and pelvis was performed using the standard protocol following bolus administration of intravenous contrast. CONTRAST:  OMNIPAQUE IOHEXOL 300 MG/ML  SOLN COMPARISON:  07/04/2021 FINDINGS: Lower chest: Bibasilar atelectasis is noted superimposed over some scarring seen on the prior examination. Hepatobiliary: Liver is within normal limits. Small hypodensity is noted stable from the prior exam which may represent a small hemangioma or cyst. The gallbladder is well distended with evidence of pericholecystic fluid and mild inflammatory change suspicious for acute cholecystitis. These inflammatory changes extend around the second portion of the duodenum as well as the head of the pancreas and inferiorly towards the hepatic flexure. Pancreas: Pancreas is well visualized with some fatty interdigitation. Mild inflammatory changes are seen although they are felt to be secondary to the changes in the gallbladder. Spleen: Normal in size without focal abnormality. Adrenals/Urinary Tract: Adrenal glands are within normal limits. Bilateral renal cystic change is seen similar to that noted on prior exams. Nonobstructing renal calculi are again seen in the lower pole of the right kidney stable from the prior exam. No ureteral stones are seen. The bladder is partially distended. Stomach/Bowel: Mild diverticular change of the colon is noted worst in the sigmoid colon without definitive changes of diverticulitis. The appendix is within normal limits. Small bowel and stomach appear within normal limits aside from the inflammatory changes surrounding the second portion of the duodenum again felt to be related to the gallbladder. Vascular/Lymphatic: Aortic atherosclerosis. The  abdominal aorta is dilated to 3 cm in the infrarenal segment. No enlarged abdominal or pelvic lymph nodes. Reproductive: Prostate is unremarkable. Other: No abdominal wall hernia or abnormality. No abdominopelvic ascites. Musculoskeletal: No acute or significant osseous findings. IMPRESSION: Inflammatory changes in the right upper quadrant surrounding primarily the gallbladder but extending around the second portion of the duodenum and towards the pancreatic head. These changes are suspicious for acute cholecystitis. Ultrasound would be helpful for further evaluation. Nonobstructing right renal calculi as well as bilateral renal cysts stable from prior study. Diverticulosis without diverticulitis. 3 cm infrarenal abdominal aortic aneurysm. Recommend follow-up every 3 years. Reference: J Am Coll Radiol 2013;10:789-794. Aortic Atherosclerosis (ICD10-I70.0). Electronically Signed   By: Alcide Clever M.D.   On: 09/23/2021 23:48    Procedures Procedures   Medications Ordered in ED Medications  piperacillin-tazobactam (ZOSYN) IVPB 3.375 g (has no administration in time range)  acetaminophen (TYLENOL) tablet 1,000 mg (1,000 mg Oral Given 09/23/21 2253)  sodium chloride 0.9 % bolus 500 mL (0 mLs Intravenous Stopped 09/24/21 0000)  fentaNYL (SUBLIMAZE) injection 50 mcg (50 mcg Intravenous Given 09/23/21 2300)  iohexol (OMNIPAQUE) 300 MG/ML solution 100 mL (100 mLs Intravenous Contrast Given 09/23/21 2325)    ED Course  I have reviewed the triage vital signs and the nursing notes.  Pertinent labs & imaging results that were available during my care of the patient were reviewed by me and considered in my medical decision making (see chart for details).  Patient seen and examined. Work-up initiated.  Work-up reviewed.  Will need CT imaging.  Medications ordered.   Vital signs reviewed and are as follows: BP (!) 159/76   Pulse 96   Temp 99.7 F (37.6 C) (Oral)   Resp (!) 23   Ht 6' (1.829 m)   Wt 96.2  kg   SpO2 95%   BMI 28.75 kg/m   Patient discussed with and seen by Dr. Donnald Garre.  CT concerning for cholecystitis.  Will need hospitalist admission.  We will touch base with surgery.  12:26 AM I spoke with Dr. Sheliah Hatch of surgery who will see the patient. Will need admission to medical service.   Plan to admit. Dr. Eudelia Bunch aware at shift change.   Abx and RUQ [place] ordered.     MDM Rules/Calculators/A&P                           Admit.     Final Clinical Impression(s) / ED Diagnoses Final diagnoses:  RUQ pain  Upper abdominal pain    Rx / DC Orders ED Discharge Orders     None        Renne Crigler, PA-C 09/24/21 0030    Arby Barrette, MD 09/27/21 1649

## 2021-09-23 NOTE — ED Provider Notes (Signed)
Emergency Medicine Provider Triage Evaluation Note  Joshua [name] , a 85 y.o. male  was evaluated in triage.  Pt complains of gradual onset, constant, achy, diffuse abdominal pain for the past couple of days. + nausea, vomiting, diarrhea, fevers. He was found to be hypoxic at 89% on RA here; does not wear O2 at baseline. Has had a worsening cough as well. Denies recent sick contacts. Is vaccinated for COVID.   Review of Systems  Positive: + fever, abd pain, nausea, vomiting, diarrhea, cough Negative: - sore throat  Physical Exam  BP (!) 185/159 (BP Location: Right Arm)   Pulse (!) 103   Temp 100.1 F (37.8 C) (Oral)   Resp 20   SpO2 (!) 89%  Gen:   Awake, diaphoretic. Coughing.  Resp:  Mild tachypnea MSK:   Moves extremities without difficulty  Other:  Diffuse abdominal TTP  Medical Decision Making  Medically screening exam initiated at 7:54 PM.  Appropriate orders placed.  DAEJON LICH was informed that the remainder of the evaluation will be completed by another provider, this initial triage assessment does not replace that evaluation, and the importance of remaining in the ED until their evaluation is complete.     Tanda Rockers, PA-C 09/23/21 1956    Benjiman Core, MD 09/23/21 2043

## 2021-09-23 NOTE — Progress Notes (Signed)
Pt being followed by [facility] for Sepsis protocol. 

## 2021-09-23 NOTE — ED Notes (Signed)
Provider at bedside

## 2021-09-23 NOTE — ED Triage Notes (Signed)
PT states abd pain, vomiting and diarrhea since yesterday.  Denies headache or sore throat.  Hx of copd and states he normally is not on O2.  Sats of 89% on RA in triage.

## 2021-09-24 ENCOUNTER — Encounter (HOSPITAL_COMMUNITY): Payer: Self-pay | Admitting: Family Medicine

## 2021-09-24 ENCOUNTER — Encounter (HOSPITAL_COMMUNITY)
Admission: EM | Disposition: A | Discharge status: Home Health | Payer: Self-pay | Source: Home / Self Care | Attending: Family Medicine

## 2021-09-24 ENCOUNTER — Emergency Department (HOSPITAL_COMMUNITY): Payer: Medicare Other

## 2021-09-24 ENCOUNTER — Observation Stay (HOSPITAL_COMMUNITY): Payer: Medicare Other | Admitting: Anesthesiology

## 2021-09-24 DIAGNOSIS — K819 Cholecystitis, unspecified: Secondary | ICD-10-CM | POA: Diagnosis present

## 2021-09-24 DIAGNOSIS — I714 Abdominal aortic aneurysm, without rupture, unspecified: Secondary | ICD-10-CM | POA: Diagnosis present

## 2021-09-24 DIAGNOSIS — K82A1 Gangrene of gallbladder in cholecystitis: Secondary | ICD-10-CM | POA: Diagnosis not present

## 2021-09-24 DIAGNOSIS — R9431 Abnormal electrocardiogram [ECG] [EKG]: Secondary | ICD-10-CM | POA: Diagnosis present

## 2021-09-24 DIAGNOSIS — Z96653 Presence of artificial knee joint, bilateral: Secondary | ICD-10-CM | POA: Diagnosis present

## 2021-09-24 DIAGNOSIS — J449 Chronic obstructive pulmonary disease, unspecified: Secondary | ICD-10-CM | POA: Diagnosis not present

## 2021-09-24 DIAGNOSIS — E876 Hypokalemia: Secondary | ICD-10-CM | POA: Diagnosis not present

## 2021-09-24 DIAGNOSIS — N179 Acute kidney failure, unspecified: Secondary | ICD-10-CM | POA: Diagnosis not present

## 2021-09-24 DIAGNOSIS — E119 Type 2 diabetes mellitus without complications: Secondary | ICD-10-CM | POA: Diagnosis not present

## 2021-09-24 DIAGNOSIS — I10 Essential (primary) hypertension: Secondary | ICD-10-CM | POA: Diagnosis not present

## 2021-09-24 DIAGNOSIS — J9601 Acute respiratory failure with hypoxia: Secondary | ICD-10-CM | POA: Diagnosis not present

## 2021-09-24 DIAGNOSIS — J189 Pneumonia, unspecified organism: Secondary | ICD-10-CM | POA: Diagnosis not present

## 2021-09-24 DIAGNOSIS — Z9181 History of falling: Secondary | ICD-10-CM | POA: Diagnosis not present

## 2021-09-24 DIAGNOSIS — R931 Abnormal findings on diagnostic imaging of heart and coronary circulation: Secondary | ICD-10-CM | POA: Diagnosis not present

## 2021-09-24 DIAGNOSIS — Z79899 Other long term (current) drug therapy: Secondary | ICD-10-CM | POA: Diagnosis not present

## 2021-09-24 DIAGNOSIS — R1010 Upper abdominal pain, unspecified: Secondary | ICD-10-CM | POA: Diagnosis not present

## 2021-09-24 DIAGNOSIS — R0609 Other forms of dyspnea: Secondary | ICD-10-CM | POA: Diagnosis not present

## 2021-09-24 DIAGNOSIS — K219 Gastro-esophageal reflux disease without esophagitis: Secondary | ICD-10-CM | POA: Diagnosis present

## 2021-09-24 DIAGNOSIS — J159 Unspecified bacterial pneumonia: Secondary | ICD-10-CM | POA: Diagnosis not present

## 2021-09-24 DIAGNOSIS — Z20822 Contact with and (suspected) exposure to covid-19: Secondary | ICD-10-CM | POA: Diagnosis not present

## 2021-09-24 DIAGNOSIS — K810 Acute cholecystitis: Secondary | ICD-10-CM | POA: Diagnosis not present

## 2021-09-24 DIAGNOSIS — M545 Low back pain, unspecified: Secondary | ICD-10-CM | POA: Diagnosis present

## 2021-09-24 DIAGNOSIS — R296 Repeated falls: Secondary | ICD-10-CM | POA: Diagnosis present

## 2021-09-24 DIAGNOSIS — I82539 Chronic embolism and thrombosis of unspecified popliteal vein: Secondary | ICD-10-CM | POA: Diagnosis not present

## 2021-09-24 DIAGNOSIS — E1165 Type 2 diabetes mellitus with hyperglycemia: Secondary | ICD-10-CM | POA: Diagnosis not present

## 2021-09-24 DIAGNOSIS — G8929 Other chronic pain: Secondary | ICD-10-CM | POA: Diagnosis present

## 2021-09-24 DIAGNOSIS — K429 Umbilical hernia without obstruction or gangrene: Secondary | ICD-10-CM | POA: Diagnosis not present

## 2021-09-24 DIAGNOSIS — R1011 Right upper quadrant pain: Secondary | ICD-10-CM

## 2021-09-24 DIAGNOSIS — Z981 Arthrodesis status: Secondary | ICD-10-CM | POA: Diagnosis not present

## 2021-09-24 DIAGNOSIS — R0602 Shortness of breath: Secondary | ICD-10-CM | POA: Diagnosis not present

## 2021-09-24 DIAGNOSIS — I82439 Acute embolism and thrombosis of unspecified popliteal vein: Secondary | ICD-10-CM | POA: Diagnosis not present

## 2021-09-24 DIAGNOSIS — I82532 Chronic embolism and thrombosis of left popliteal vein: Secondary | ICD-10-CM | POA: Diagnosis not present

## 2021-09-24 DIAGNOSIS — R0902 Hypoxemia: Secondary | ICD-10-CM | POA: Diagnosis not present

## 2021-09-24 DIAGNOSIS — Z87891 Personal history of nicotine dependence: Secondary | ICD-10-CM | POA: Diagnosis not present

## 2021-09-24 DIAGNOSIS — R410 Disorientation, unspecified: Secondary | ICD-10-CM | POA: Diagnosis not present

## 2021-09-24 HISTORY — PX: CHOLECYSTECTOMY: SHX55

## 2021-09-24 LAB — COMPREHENSIVE METABOLIC PANEL
ALT: 40 U/L (ref 0–44)
AST: 32 U/L (ref 15–41)
Albumin: 2.9 g/dL — ABNORMAL LOW (ref 3.5–5.0)
Alkaline Phosphatase: 46 U/L (ref 38–126)
Anion gap: 11 (ref 5–15)
BUN: 19 mg/dL (ref 8–23)
CO2: 25 mmol/L (ref 22–32)
Calcium: 8.5 mg/dL — ABNORMAL LOW (ref 8.9–10.3)
Chloride: 105 mmol/L (ref 98–111)
Creatinine, Ser: 1.42 mg/dL — ABNORMAL HIGH (ref 0.61–1.24)
GFR, Estimated: 49 mL/min — ABNORMAL LOW (ref >60)
Glucose, Bld: 148 mg/dL — ABNORMAL HIGH (ref 70–99)
Potassium: 4.2 mmol/L (ref 3.5–5.1)
Sodium: 141 mmol/L (ref 135–145)
Total Bilirubin: 0.9 mg/dL (ref 0.3–1.2)
Total Protein: 6.1 g/dL — ABNORMAL LOW (ref 6.5–8.1)

## 2021-09-24 LAB — URINALYSIS, ROUTINE W REFLEX MICROSCOPIC
Bacteria, UA: NONE SEEN
Bilirubin Urine: NEGATIVE
Glucose, UA: 50 mg/dL — AB
Ketones, ur: NEGATIVE mg/dL
Leukocytes,Ua: NEGATIVE
Nitrite: NEGATIVE
Protein, ur: 30 mg/dL — AB
Specific Gravity, Urine: >1.046 — ABNORMAL HIGH (ref 1.005–1.030)
pH: 5 (ref 5.0–8.0)

## 2021-09-24 LAB — CBC
HCT: 45.9 % (ref 39.0–52.0)
Hemoglobin: 14.8 g/dL (ref 13.0–17.0)
MCH: 30.8 pg (ref 26.0–34.0)
MCHC: 32.2 g/dL (ref 30.0–36.0)
MCV: 95.6 fL (ref 80.0–100.0)
Platelets: 259 10*3/uL (ref 150–400)
RBC: 4.8 MIL/uL (ref 4.22–5.81)
RDW: 14.1 % (ref 11.5–15.5)
WBC: 20 10*3/uL — ABNORMAL HIGH (ref 4.0–10.5)
nRBC: 0 % (ref 0.0–0.2)

## 2021-09-24 LAB — GLUCOSE, CAPILLARY
Glucose-Capillary: 118 mg/dL — ABNORMAL HIGH (ref 70–99)
Glucose-Capillary: 132 mg/dL — ABNORMAL HIGH (ref 70–99)
Glucose-Capillary: 159 mg/dL — ABNORMAL HIGH (ref 70–99)
Glucose-Capillary: 151 mg/dL — ABNORMAL HIGH (ref 70–99)

## 2021-09-24 LAB — CBG MONITORING, ED: Glucose-Capillary: 120 mg/dL — ABNORMAL HIGH (ref 70–99)

## 2021-09-24 LAB — LIPASE, BLOOD: Lipase: 415 U/L — ABNORMAL HIGH (ref 11–51)

## 2021-09-24 SURGERY — LAPAROSCOPIC CHOLECYSTECTOMY
Anesthesia: General

## 2021-09-24 MED ORDER — LACTATED RINGERS IV SOLN: INTRAVENOUS | Status: DC

## 2021-09-24 MED ORDER — ROCURONIUM BROMIDE 10 MG/ML (PF) SYRINGE
PREFILLED_SYRINGE | INTRAVENOUS | Status: DC | PRN
  Administered 2021-09-24: 70 mg via INTRAVENOUS

## 2021-09-24 MED ORDER — ACETAMINOPHEN 650 MG RE SUPP: 650.0000 mg | Freq: Four times a day (QID) | RECTAL | Status: DC | PRN

## 2021-09-24 MED ORDER — POLYETHYLENE GLYCOL 3350 17 G PO PACK
17.0000 g | PACK | Freq: Every day | ORAL | Status: DC | PRN
  Administered 2021-09-26: 17 g via ORAL
  Filled 2021-09-24: qty 1

## 2021-09-24 MED ORDER — PHENYLEPHRINE HCL-NACL 20-0.9 MG/250ML-% IV SOLN
INTRAVENOUS | Status: DC | PRN
Start: 2021-09-24 — End: 2021-09-24
  Administered 2021-09-24: 50 ug/min via INTRAVENOUS

## 2021-09-24 MED ORDER — BUPIVACAINE-EPINEPHRINE 0.25% -1:200000 IJ SOLN
INTRAMUSCULAR | Status: DC | PRN
  Administered 2021-09-24: 6 mL

## 2021-09-24 MED ORDER — OXYCODONE HCL 5 MG PO TABS
5.0000 mg | ORAL_TABLET | ORAL | Status: DC | PRN
Start: 2021-09-24 — End: 2021-09-25
  Administered 2021-09-24: 5 mg via ORAL
  Filled 2021-09-24: qty 1

## 2021-09-24 MED ORDER — PIPERACILLIN-TAZOBACTAM 3.375 G IVPB 30 MIN
3.3750 g | Freq: Once | INTRAVENOUS | Status: AC
  Administered 2021-09-24: 3.375 g via INTRAVENOUS
  Filled 2021-09-24: qty 50

## 2021-09-24 MED ORDER — CHLORHEXIDINE GLUCONATE 4 % EX LIQD
1.0000 "application " | Freq: Once | CUTANEOUS | Status: DC
  Filled 2021-09-24: qty 15

## 2021-09-24 MED ORDER — PIPERACILLIN-TAZOBACTAM 3.375 G IVPB
3.3750 g | Freq: Three times a day (TID) | INTRAVENOUS | Status: DC
Start: 2021-09-24 — End: 2021-09-25
  Administered 2021-09-24 – 2021-09-25 (×3): 3.375 g via INTRAVENOUS
  Filled 2021-09-24 (×3): qty 50

## 2021-09-24 MED ORDER — DEXAMETHASONE SODIUM PHOSPHATE 10 MG/ML IJ SOLN
INTRAMUSCULAR | Status: DC | PRN
  Administered 2021-09-24: 10 mg via INTRAVENOUS

## 2021-09-24 MED ORDER — ACETAMINOPHEN 325 MG PO TABS
650.0000 mg | ORAL_TABLET | Freq: Four times a day (QID) | ORAL | Status: DC | PRN
  Administered 2021-09-24 – 2021-09-25 (×2): 650 mg via ORAL
  Filled 2021-09-24 (×2): qty 2

## 2021-09-24 MED ORDER — ROCURONIUM BROMIDE 10 MG/ML (PF) SYRINGE
PREFILLED_SYRINGE | INTRAVENOUS | Status: AC
  Filled 2021-09-24: qty 10

## 2021-09-24 MED ORDER — PROPOFOL 10 MG/ML IV BOLUS
INTRAVENOUS | Status: DC | PRN
  Administered 2021-09-24: 110 mg via INTRAVENOUS

## 2021-09-24 MED ORDER — ONDANSETRON HCL 4 MG/2ML IJ SOLN
INTRAMUSCULAR | Status: DC | PRN
  Administered 2021-09-24: 4 mg via INTRAVENOUS

## 2021-09-24 MED ORDER — LIDOCAINE 2% (20 MG/ML) 5 ML SYRINGE
INTRAMUSCULAR | Status: AC
  Filled 2021-09-24: qty 5

## 2021-09-24 MED ORDER — PROPOFOL 10 MG/ML IV BOLUS
INTRAVENOUS | Status: AC
  Filled 2021-09-24: qty 20

## 2021-09-24 MED ORDER — CHLORHEXIDINE GLUCONATE 0.12 % MT SOLN
15.0000 mL | Freq: Once | OROMUCOSAL | Status: AC
  Administered 2021-09-24: 15 mL via OROMUCOSAL
  Filled 2021-09-24: qty 15

## 2021-09-24 MED ORDER — DEXAMETHASONE SODIUM PHOSPHATE 10 MG/ML IJ SOLN
INTRAMUSCULAR | Status: AC
  Filled 2021-09-24: qty 1

## 2021-09-24 MED ORDER — PROMETHAZINE HCL 25 MG/ML IJ SOLN: 6.2500 mg | INTRAMUSCULAR | Status: DC | PRN

## 2021-09-24 MED ORDER — FENTANYL CITRATE (PF) 100 MCG/2ML IJ SOLN: 25.0000 ug | INTRAMUSCULAR | Status: DC | PRN

## 2021-09-24 MED ORDER — INSULIN ASPART 100 UNIT/ML IJ SOLN
0.0000 [IU] | Freq: Three times a day (TID) | INTRAMUSCULAR | Status: DC
  Administered 2021-09-24 – 2021-09-25 (×3): 1 [IU] via SUBCUTANEOUS

## 2021-09-24 MED ORDER — PHENYLEPHRINE 40 MCG/ML (10ML) SYRINGE FOR IV PUSH (FOR BLOOD PRESSURE SUPPORT)
PREFILLED_SYRINGE | INTRAVENOUS | Status: AC
  Filled 2021-09-24: qty 10

## 2021-09-24 MED ORDER — FENTANYL CITRATE (PF) 250 MCG/5ML IJ SOLN
INTRAMUSCULAR | Status: DC | PRN
  Administered 2021-09-24 (×5): 50 ug via INTRAVENOUS

## 2021-09-24 MED ORDER — ACETAMINOPHEN 325 MG PO TABS
325.0000 mg | ORAL_TABLET | Freq: Once | ORAL | Status: AC
  Administered 2021-09-24: 325 mg via ORAL
  Filled 2021-09-24: qty 1

## 2021-09-24 MED ORDER — METOPROLOL TARTRATE 5 MG/5ML IV SOLN
INTRAVENOUS | Status: DC | PRN
  Administered 2021-09-24 (×2): 1 mg via INTRAVENOUS
  Administered 2021-09-24: .5 mg via INTRAVENOUS

## 2021-09-24 MED ORDER — 0.9 % SODIUM CHLORIDE (POUR BTL) OPTIME
TOPICAL | Status: DC | PRN
  Administered 2021-09-24: 1000 mL

## 2021-09-24 MED ORDER — FENTANYL CITRATE (PF) 250 MCG/5ML IJ SOLN
INTRAMUSCULAR | Status: AC
  Filled 2021-09-24: qty 5

## 2021-09-24 MED ORDER — ONDANSETRON HCL 4 MG/2ML IJ SOLN
INTRAMUSCULAR | Status: AC
  Filled 2021-09-24: qty 2

## 2021-09-24 MED ORDER — SODIUM CHLORIDE 0.9 % IR SOLN
Status: DC | PRN
  Administered 2021-09-24 (×2): 1000 mL

## 2021-09-24 MED ORDER — LIDOCAINE 2% (20 MG/ML) 5 ML SYRINGE
INTRAMUSCULAR | Status: DC | PRN
  Administered 2021-09-24: 100 mg via INTRAVENOUS

## 2021-09-24 MED ORDER — EPHEDRINE SULFATE-NACL 50-0.9 MG/10ML-% IV SOSY
PREFILLED_SYRINGE | INTRAVENOUS | Status: DC | PRN
  Administered 2021-09-24: 10 mg via INTRAVENOUS

## 2021-09-24 MED ORDER — SUGAMMADEX SODIUM 200 MG/2ML IV SOLN
INTRAVENOUS | Status: DC | PRN
Start: 2021-09-24 — End: 2021-09-24
  Administered 2021-09-24: 200 mg via INTRAVENOUS

## 2021-09-24 MED ORDER — BUPIVACAINE-EPINEPHRINE (PF) 0.25% -1:200000 IJ SOLN
INTRAMUSCULAR | Status: AC
  Filled 2021-09-24: qty 30

## 2021-09-24 MED ORDER — PHENYLEPHRINE HCL (PRESSORS) 10 MG/ML IV SOLN
INTRAVENOUS | Status: AC
  Filled 2021-09-24: qty 2

## 2021-09-24 MED ORDER — AMISULPRIDE (ANTIEMETIC) 5 MG/2ML IV SOLN: 10.0000 mg | Freq: Once | INTRAVENOUS | Status: DC | PRN

## 2021-09-24 MED ORDER — PANTOPRAZOLE SODIUM 40 MG IV SOLR
40.0000 mg | Freq: Two times a day (BID) | INTRAVENOUS | Status: DC
  Administered 2021-09-24 – 2021-09-25 (×4): 40 mg via INTRAVENOUS
  Filled 2021-09-24 (×4): qty 40

## 2021-09-24 MED ORDER — PHENYLEPHRINE 40 MCG/ML (10ML) SYRINGE FOR IV PUSH (FOR BLOOD PRESSURE SUPPORT)
PREFILLED_SYRINGE | INTRAVENOUS | Status: DC | PRN
  Administered 2021-09-24 (×2): 200 ug via INTRAVENOUS

## 2021-09-24 SURGICAL SUPPLY — 49 items
ADH SKN CLS APL DERMABOND .7 (GAUZE/BANDAGES/DRESSINGS) ×1
ADH SKN CLS LQ APL DERMABOND (GAUZE/BANDAGES/DRESSINGS) ×1
APL PRP STRL LF DISP 70% ISPRP (MISCELLANEOUS) ×1
APPLIER CLIP 5 13 M/L LIGAMAX5 (MISCELLANEOUS) ×2
APR CLP MED LRG 5 ANG JAW (MISCELLANEOUS) ×1
BAG COUNTER SPONGE SURGICOUNT (BAG) ×2 IMPLANT
BAG SPEC RTRVL LRG 6X4 10 (ENDOMECHANICALS) ×1
BAG SPNG CNTER NS LX DISP (BAG) ×1
BLADE CLIPPER SURG (BLADE) ×1 IMPLANT
CANISTER SUCT 3000ML PPV (MISCELLANEOUS) ×2 IMPLANT
CHLORAPREP W/TINT 26 (MISCELLANEOUS) ×2 IMPLANT
CLIP APPLIE 5 13 M/L LIGAMAX5 (MISCELLANEOUS) ×1 IMPLANT
COVER SURGICAL LIGHT HANDLE (MISCELLANEOUS) ×2 IMPLANT
DERMABOND ADHESIVE PROPEN (GAUZE/BANDAGES/DRESSINGS) ×1
DERMABOND ADVANCED (GAUZE/BANDAGES/DRESSINGS) ×1
DERMABOND ADVANCED .7 DNX12 (GAUZE/BANDAGES/DRESSINGS) ×1 IMPLANT
DERMABOND ADVANCED .7 DNX6 (GAUZE/BANDAGES/DRESSINGS) IMPLANT
ELECT REM PT RETURN 9FT ADLT (ELECTROSURGICAL) ×2
ELECTRODE REM PT RTRN 9FT ADLT (ELECTROSURGICAL) ×1 IMPLANT
GLOVE SURG POLY MICRO LF SZ5.5 (GLOVE) ×2 IMPLANT
GLOVE SURG UNDER POLY LF SZ6 (GLOVE) ×2 IMPLANT
GOWN STRL REUS W/ TWL LRG LVL3 (GOWN DISPOSABLE) ×3 IMPLANT
GOWN STRL REUS W/TWL LRG LVL3 (GOWN DISPOSABLE) ×6
KIT BASIN OR (CUSTOM PROCEDURE TRAY) ×2 IMPLANT
KIT TURNOVER KIT B (KITS) ×2 IMPLANT
L-HOOK LAP DISP 36CM (ELECTROSURGICAL) ×2
LHOOK LAP DISP 36CM (ELECTROSURGICAL) ×1 IMPLANT
NDL INSUFFLATION 14GA 120MM (NEEDLE) IMPLANT
NEEDLE INSUFFLATION 14GA 120MM (NEEDLE) IMPLANT
NS IRRIG 1000ML POUR BTL (IV SOLUTION) ×2 IMPLANT
PAD ARMBOARD 7.5X6 YLW CONV (MISCELLANEOUS) ×2 IMPLANT
PENCIL BUTTON HOLSTER BLD 10FT (ELECTRODE) ×2 IMPLANT
POUCH SPECIMEN RETRIEVAL 10MM (ENDOMECHANICALS) ×2 IMPLANT
SCISSORS LAP 5X35 DISP (ENDOMECHANICALS) ×2 IMPLANT
SET IRRIG TUBING LAPAROSCOPIC (IRRIGATION / IRRIGATOR) ×2 IMPLANT
SET TUBE SMOKE EVAC HIGH FLOW (TUBING) ×2 IMPLANT
SLEEVE ENDOPATH XCEL 5M (ENDOMECHANICALS) ×4 IMPLANT
SPECIMEN JAR SMALL (MISCELLANEOUS) ×2 IMPLANT
SUT MNCRL AB 4-0 PS2 18 (SUTURE) ×2 IMPLANT
SUT VIC AB 3-0 SH 27 (SUTURE)
SUT VIC AB 3-0 SH 27XBRD (SUTURE) IMPLANT
SUT VICRYL 0 UR6 27IN ABS (SUTURE) ×2 IMPLANT
TOWEL GREEN STERILE (TOWEL DISPOSABLE) ×2 IMPLANT
TOWEL GREEN STERILE FF (TOWEL DISPOSABLE) ×2 IMPLANT
TRAY LAPAROSCOPIC MC (CUSTOM PROCEDURE TRAY) ×2 IMPLANT
TROCAR XCEL 12X100 BLDLESS (ENDOMECHANICALS) IMPLANT
TROCAR XCEL BLUNT TIP 100MML (ENDOMECHANICALS) ×2 IMPLANT
TROCAR XCEL NON-BLD 5MMX100MML (ENDOMECHANICALS) ×2 IMPLANT
WATER STERILE IRR 1000ML POUR (IV SOLUTION) ×2 IMPLANT

## 2021-09-24 NOTE — Progress Notes (Signed)
I saw and evaluated the patient and his daughter at the bedside s/p laparoscopic cholecystectomy under general anesthesia. Op note indicates his gallbladder was gangrenous and umbilical hernia was also removed. He denies any abdominal pain, nausea, vomiting. His only complaint is a headache. He appears well and he is tolerating fluids well.   Spoke with his RN who brought him an incentive spirometer to which I educated the patient to use 10 times an hour. Order placed for additional 325mg  PO tylenol for headache, already received 650mg .  , DO [facility], PGY-1 [phone], text pages welcome

## 2021-09-24 NOTE — ED Notes (Signed)
Admit provider at bedside 

## 2021-09-24 NOTE — Op Note (Signed)
Date: 09/24/21  Patient: Joshua [name] MRN: [id number]  Preoperative Diagnosis: Acute cholecystitis Postoperative Diagnosis: Acute gangrenous cholecystitis  Procedure: Laparoscopic cholecystectomy  Surgeon: Sophronia Simas, MD Assistant: Marca Ancona, MD  EBL: Minimal  Anesthesia: General endotracheal  Specimens: Gallbladder  Indications: Joshua [name] is an 85 year old male who presented to the ED with 2 days of worsening upper abdominal pain.  He was noted to have significant leukocytosis.  CT scan showed distention and edema of the gallbladder consistent with acute cholecystitis.  He was started on antibiotics and brought to the operating room for cholecystectomy.  Findings: Gangrenous cholecystitis.  Procedure details: Informed consent was obtained in the preoperative area prior to the procedure. The patient was brought to the operating room and placed on the table in the supine position.  General anesthesia was induced and appropriate lines and drains were placed for intraoperative monitoring. Perioperative antibiotics were administered per SCIP guidelines. The abdomen was prepped and draped in the usual sterile fashion. A pre-procedure timeout was taken verifying patient identity, surgical site and procedure to be performed.  A supraumbilical skin incision was made, the subcutaneous tissue was divided with cautery, and an umbilical hernia sac was encountered.  The sac was opened and contained viable preperitoneal fat.  The hernia sac and contents were dissected circumferentially off the fascia, and the contents were reduced back into the abdomen. The resulting fascial defect was approximately 1.5 cm in diameter.  A 57mm Hassan trocar was placed through the hernia defect. The peritoneal cavity was inspected with no evidence of visceral or vascular injury. Three 50mm ports were placed in the right subcostal margin, all under direct visualization.  Omentum was swept down off the liver to expose  the gallbladder.  The fundus of the gallbladder was grasped and retracted cephalad.  The gallbladder was significantly distended and gangrenous in appearance.  It was very friable and a defect in the wall was made while grasping the gallbladder.  There was some omental adhesions to the gallbladder, which were taken down using blunt dissection.  The duodenum was visualized and was not adherent to the gallbladder.  The infundibulum was retracted laterally. The cystic triangle was dissected out using cautery and blunt dissection, and the critical view of safety was obtained. The cystic duct and cystic artery were clipped and ligated, leaving 3 clips behind on the cystic duct stump. The gallbladder was taken off the liver using cautery. The specimen was placed in an endocatch bag and removed. The surgical site was copiously irrigated with saline until the effluent was clear. Hemostasis was achieved in the gallbladder fossa using cautery. The cystic duct and artery stumps were visually inspected and there was no evidence of bile leak or bleeding. The ports were removed under direct visualization and the abdomen was desufflated. The umbilical port site fascia was closed with a 0 vicryl suture. The skin at all port sites was closed with 4-0 monocryl subcuticular suture. Dermabond was applied.  The patient tolerated the procedure with no apparent complications.  All counts were correct x2 at the end of the procedure. The patient was extubated and taken to PACU in stable condition.  Sophronia Simas, MD 09/24/21 12:07 PM

## 2021-09-24 NOTE — Anesthesia Preprocedure Evaluation (Addendum)
Anesthesia Evaluation  Patient identified by MRN, date of birth, ID band Patient awake    Reviewed: Allergy & Precautions, NPO status , Patient's Chart, lab work & pertinent test results  History of Anesthesia Complications Negative for: history of anesthetic complications  Airway Mallampati: I  TM Distance: >3 FB Neck ROM: Full    Dental  (+) Edentulous Upper, Partial Lower, Dental Advisory Given   Pulmonary COPD, former smoker,    Pulmonary exam normal        Cardiovascular hypertension, Pt. on medications and Pt. on home beta blockers Normal cardiovascular exam     Neuro/Psych  Headaches, negative psych ROS   GI/Hepatic Neg liver ROS,   Endo/Other  negative endocrine ROSdiabetes, Oral Hypoglycemic Agents  Renal/GU negative Renal ROS     Musculoskeletal  (+) Arthritis , Osteoarthritis,    Abdominal   Peds  Hematology HLD   Anesthesia Other Findings   Reproductive/Obstetrics                            Anesthesia Physical  Anesthesia Plan  ASA: 3  Anesthesia Plan: General   Post-op Pain Management:    Induction: Intravenous  PONV Risk Score and Plan: 4 or greater and Ondansetron, Dexamethasone, Treatment may vary due to age or medical condition and Diphenhydramine  Airway Management Planned: Oral ETT  Additional Equipment:   Intra-op Plan:   Post-operative Plan: Extubation in OR  Informed Consent: I have reviewed the patients History and Physical, chart, labs and discussed the procedure including the risks, benefits and alternatives for the proposed anesthesia with the patient or authorized representative who has indicated his/her understanding and acceptance.     Dental advisory given  Plan Discussed with: Anesthesiologist and CRNA  Anesthesia Plan Comments:        Anesthesia Quick Evaluation

## 2021-09-24 NOTE — Progress Notes (Signed)
Pharmacy Antibiotic Note  Joshua [name] is a 85 y.o. male admitted on 09/23/2021 with  intra-abd infection - likely gallbladder issues.  Pharmacy has been consulted for Zosyn dosing.  Plan: Zosyn 3.375gm IV q8h Will f/u renal function, micro data, and pt's clinical condition  Height: 6' (182.9 cm) Weight: 96.2 kg (212 lb) IBW/kg (Calculated) : 77.6  Temp (24hrs), Avg:99.9 F (37.7 C), Min:99.7 F (37.6 C), Max:100.1 F (37.8 C)  Recent Labs  Lab 09/23/21 2027  WBC 18.0*  CREATININE 1.36*  LATICACIDVEN 2.0*    Estimated Creatinine Clearance: 49.5 mL/min (A) (by C-G formula based on SCr of 1.36 mg/dL (H)).    No Known Allergies  Antimicrobials this admission: 10/16 Zosyn >>  Microbiology results: 10/15 BCx:   UCx:    Thank you for allowing pharmacy to be a part of this patient's care.  Christoper Fabian, PharmD, BCPS Please see amion for complete clinical pharmacist phone list 09/24/2021 1:41 AM

## 2021-09-24 NOTE — Anesthesia Procedure Notes (Signed)
Procedure Name: Intubation Date/Time: 09/24/2021 11:05 AM Performed by: Rosiland Oz, CRNA Pre-anesthesia Checklist: Patient identified, Emergency Drugs available, Suction available, Patient being monitored and Timeout performed Patient Re-evaluated:Patient Re-evaluated prior to induction Oxygen Delivery Method: Circle system utilized Preoxygenation: Pre-oxygenation with 100% oxygen Induction Type: IV induction Ventilation: Mask ventilation without difficulty Laryngoscope Size: 3 and Miller Grade View: Grade I Tube type: Oral Tube size: 7.5 mm Number of attempts: 1 Airway Equipment and Method: Stylet Placement Confirmation: ETT inserted through vocal cords under direct vision, positive ETCO2 and breath sounds checked- equal and bilateral Secured at: 22 cm Tube secured with: Tape Dental Injury: Teeth and Oropharynx as per pre-operative assessment

## 2021-09-24 NOTE — ED Notes (Signed)
US at bedside at this time 

## 2021-09-24 NOTE — H&P (Signed)
Family Medicine Teaching [facility] Admission History and Physical Service Pager: [phone]  Patient name: Joshua [name] Medical record number: [id number] Date of birth: 1936-09-21 Age: 85 y.o. Gender: male  Primary Care Provider: Kaleen Mask, MD Consultants: general surgery Code Status: FULL  Chief Complaint: abdominal pain  Assessment and Plan: ZARIUS FURR is a 85 y.o. male presenting with abdominal pain likely secondary to cholecystitis. PMH is significant for T2DM, COPD, HLD, HTN  Acute cholecystitis Patient presenting with 1 day of abdominal pain and vomiting found to have leukocytosis and inflammatory changes around the gallbladder consistent with acute cholecystitis.  No significant elevation in liver enzymes or fever to suggest choledocholithiasis or cholangitis.  Could consider component of PUD given dark vomit and BC/Goody powder use, though hemoglobin is reassuringly within normal limits.  He was placed on 2 L of oxygen on arrival to the ED for oxygen saturation 89% which may be due to splinting from pain.  He was started on broad-spectrum antibiotics.  Surgery has evaluated him and will likely perform cholecystectomy during the day. - admit to FPTS, med-surg, Dr. Deirdre Priest attending - general surgery following, appreciate recommendations - IV piperacillin-tazobactam per pharmacy - IV pantoprazole 40 mg BID - acetaminophen prn, oxycodone prn - AM labs ordered - f/u RUQ [place] - f/u lipase, blood cultures, urine culture - PT/OT - NPO  T2DM Glucose elevated 231 on labs.  Unclear what medications he takes at home.  Family member to bring in medications in the morning. - sSSI - A1c - monitor CBG - await formal med rec  HTN Hypertensive on arrival to 185/159 which has now improved to 137/72.  There may be some component of pain contributing. - await formal med rec  Elevated serum creatinine Cr 1.36 on admission, appears to be close to baseline.  HLD -  await formal med rec  COPD Does not appear to use any inhalers. - await formal med rec - wean O2 as tolerated  AAA 3 cm in size, seen on CT. - outpatient follow-up, needs repeat imaging every 3 years  FEN/GI: N.p.o. for procedure Prophylaxis: SCDs given pending procedure  Disposition: Pending work-up  History of Present Illness:  Joshua [name] is a 85 y.o. male presenting with abdominal pain.  He reports diffuse abdominal pain started this morning.  He believes it came on after eating [employer].  He has vomited multiple times today, wife states that the vomit was dark-colored.  Denies fever.  Patient states he has been taking a lot of Goody powders and BC powder for the past 15 years.  Review Of Systems: Per HPI with the following additions:   Review of Systems  Constitutional:  Negative for fever.  Respiratory:  Negative for shortness of breath.   Cardiovascular:  Negative for chest pain.  Gastrointestinal:  Positive for abdominal pain and vomiting.  Musculoskeletal:  Negative for back pain.   Patient Active Problem List   Diagnosis Date Noted   Cholecystitis 09/24/2021   Gastritis 07/01/2020   Abnormal CXR 08/01/2018   COPD (chronic obstructive pulmonary disease) (HCC) 08/01/2018   OA (osteoarthritis) of knee 11/11/2017   Myelopathy (HCC) 07/11/2017   Essential hypertension 04/30/2017   Diabetes mellitus type 2 in nonobese (HCC) 04/30/2017   Hyperlipidemia 04/30/2017   Right shoulder pain 04/30/2017    Past Medical History: Past Medical History:  Diagnosis Date   Anemia    hx.   Arthritis    COPD (chronic obstructive pulmonary disease) (HCC)  reports at one poin he had copd but he has stopped smoking and now doesnt have any trouble breathing or  need an inhaler    Diabetes mellitus without complication (HCC)    type 2   Headache    none recently    High cholesterol    History of kidney stones    HOH (hard of hearing)    both ears   Hypertension    Pneumonia     years ago    Past Surgical History: Past Surgical History:  Procedure Laterality Date   ANKLE FRACTURE SURGERY Left    when 64-73- years old   ANTERIOR CERVICAL DECOMP/DISCECTOMY FUSION N/A 07/11/2017   Procedure: Anterior Cervical Decompression/Discectomy Fusion  - Cervical three-four;  Surgeon: Donalee Citrin, MD;  Location: [facility] OR;  Service: Neurosurgery;  Laterality: N/A; limited extension of neck currently    CATARACT EXTRACTION Bilateral    EXTRACORPOREAL SHOCK WAVE LITHOTRIPSY Right 07/14/2020   Procedure: EXTRACORPOREAL SHOCK WAVE LITHOTRIPSY (ESWL);  Surgeon: Bjorn Pippin, MD;  [facility];  Service: Urology;  Laterality: Right;   FRACTURE SURGERY      fx. lower left leg at age 45-13   TOTAL KNEE ARTHROPLASTY Right 11/11/2017   Procedure: RIGHT TOTAL KNEE ARTHROPLASTY;  Surgeon: Ollen Gross, MD;  Location: [facility] ORS;  Service: Orthopedics;  Laterality: Right;   TOTAL KNEE ARTHROPLASTY Left 06/01/2019   Procedure: LEFT TOTAL KNEE ARTHROPLASTY;  Surgeon: Ollen Gross, MD;  Location: [facility] ORS;  Service: Orthopedics;  Laterality: Left;     Social History: Social History   Tobacco Use   Smoking status: Former    Packs/day: 2.00    Years: 60.00    Pack years: 120.00    Types: Cigarettes    Quit date: 07/10/2016    Years since quitting: 5.2   Smokeless tobacco: Never  Vaping Use   Vaping Use: Never used  Substance Use Topics   Alcohol use: Never   Drug use: Never   Additional social history: Lives with wife.  Former smoker. Please also refer to relevant sections of EMR.  Family History: Family History  Problem Relation Age of Onset   Strabismus Daughter     Allergies and Medications: No Known Allergies No current facility-administered medications on file prior to encounter.   Current Outpatient Medications on File Prior to Encounter  Medication Sig Dispense Refill   acetaminophen (TYLENOL) 325 MG tablet Take 650 mg by mouth every 4 (four)  hours as needed for moderate pain or headache.     acetaminophen (TYLENOL) 650 MG CR tablet Take 650 mg by mouth every 8 (eight) hours as needed for pain.     amLODipine (NORVASC) 10 MG tablet Take 10 mg by mouth at bedtime.     gabapentin (NEURONTIN) 100 MG capsule Take 1 capsule (100 mg total) by mouth 3 (three) times daily. 100 mg TID x 2 weeks, then 100 mg BID x 2 weeks, then 100 mg QD x 2 weeks. (Patient taking differently: Take 100-300 mg by mouth every 8 (eight) hours as needed (pain).) 84 capsule 0   oxymetazoline (AFRIN) 0.05 % nasal spray Place 2 sprays into both nostrils at bedtime.     pioglitazone (ACTOS) 45 MG tablet Take 22.5 mg by mouth daily.     predniSONE (DELTASONE) 10 MG tablet Take 10 mg by mouth daily as needed (back pain).     albuterol (PROAIR HFA) 108 (90 Base) MCG/ACT inhaler Inhale 2 puffs into the lungs every  4 (four) hours as needed for wheezing or shortness of breath. (Patient not taking: Reported on 06/24/2019) 1 Inhaler 5   allopurinol (ZYLOPRIM) 300 MG tablet Take 300 mg by mouth daily. 1/2 tablet daily     glipiZIDE (GLUCOTROL XL) 10 MG 24 hr tablet Take 20 mg by mouth daily with breakfast.     JANUVIA 100 MG tablet Take 100 mg by mouth daily.     JARDIANCE 10 MG TABS tablet Take 10 mg by mouth daily.     loratadine (CLARITIN) 10 MG tablet Take 10 mg by mouth daily.     methocarbamol (ROBAXIN) 500 MG tablet Take 1 tablet (500 mg total) by mouth every 6 (six) hours as needed for muscle spasms. (Patient not taking: Reported on 05/03/2020) 40 tablet 0   metoprolol tartrate (LOPRESSOR) 100 MG tablet Take 100 mg by mouth daily.      nateglinide (STARLIX) 120 MG tablet Take 120 mg by mouth 3 (three) times daily before meals.     ondansetron (ZOFRAN ODT) 4 MG disintegrating tablet Take 1 tablet (4 mg total) by mouth every 8 (eight) hours as needed. 10 tablet 0   oxyCODONE (OXY IR/ROXICODONE) 5 MG immediate release tablet TAKE 1 TABLET BY MOUTH EVERY 6 HOURS AS NEEDED FOR  SEVERE PAIN (Patient not taking: Reported on 09/24/2021) 12 tablet 0   pantoprazole (PROTONIX) 40 MG tablet Take 1 tablet (40 mg total) by mouth daily. 14 tablet 0   simvastatin (ZOCOR) 20 MG tablet Take 20 mg by mouth every evening.       Objective: BP 137/72   Pulse 100   Temp 99.7 F (37.6 C) (Oral)   Resp 19   Ht 6' (1.829 m)   Wt 96.2 kg   SpO2 93%   BMI 28.75 kg/m  Exam: General: Alert, hard of hearing, intermittently wincing in pain, NAD Eyes: EOMI Neck: Supple Cardiovascular: RRR, no murmurs Respiratory: Clear to auscultation bilaterally Gastrointestinal: Soft, diffusely tender but worse in the RUQ, positive Murphy sign MSK: No obvious deformity Derm: Warm, dry Neuro: Alert and interactive Psych: Affect appropriate  Labs and Imaging: CBC BMET  Recent Labs  Lab 09/23/21 2027  WBC 18.0*  HGB 15.7  HCT 48.2  PLT 201   Recent Labs  Lab 09/23/21 2027  NA 140  K 3.9  CL 101  CO2 27  BUN 17  CREATININE 1.36*  GLUCOSE 231*  CALCIUM 9.0       Littie Deeds, MD 09/24/2021, 1:44 AM PGY-2, [facility] FPTS Intern pager: [phone], text pages welcome

## 2021-09-24 NOTE — H&P (Signed)
Reason for Consult:abdominal pain Referring Provider: Rhea Bleacher  Joshua [name] is an 85 y.o. male.  HPI: 86 yo male with 1 day of abdominal pain and several episodes of vomiting. Pain started in his upper abdomen and now is more diffuse. It is moderate to intense. It is constant. It came on after [facility]  He does not take blood thinners. He does take BC powders multiple times a day.  Past Medical History:  Diagnosis Date   Anemia    hx.   Arthritis    COPD (chronic obstructive pulmonary disease) (HCC)    reports at one poin he had copd but he has stopped smoking and now doesnt have any trouble breathing or  need an inhaler    Diabetes mellitus without complication (HCC)    type 2   Headache    none recently    High cholesterol    History of kidney stones    HOH (hard of hearing)    both ears   Hypertension    Pneumonia    years ago    Past Surgical History:  Procedure Laterality Date   ANKLE FRACTURE SURGERY Left    when [age]- years old   ANTERIOR CERVICAL DECOMP/DISCECTOMY FUSION N/A 07/11/2017   Procedure: Anterior Cervical Decompression/Discectomy Fusion  - Cervical three-four;  Surgeon: Donalee Citrin, MD;  Location: [facility];  Service: Neurosurgery;  Laterality: N/A; limited extension of neck currently    CATARACT EXTRACTION Bilateral    EXTRACORPOREAL SHOCK WAVE LITHOTRIPSY Right 07/14/2020   Procedure: EXTRACORPOREAL SHOCK WAVE LITHOTRIPSY (ESWL);  Surgeon: Bjorn Pippin, MD;  Location: [facility];  Service: Urology;  Laterality: Right;   FRACTURE SURGERY      fx. lower left leg at age 33-13   TOTAL KNEE ARTHROPLASTY Right 11/11/2017   Procedure: RIGHT TOTAL KNEE ARTHROPLASTY;  Surgeon: Ollen Gross, MD;  Location: [facility];  Service: Orthopedics;  Laterality: Right;   TOTAL KNEE ARTHROPLASTY Left 06/01/2019   Procedure: LEFT TOTAL KNEE ARTHROPLASTY;  Surgeon: Ollen Gross, MD;  Location: [facility];  Service: Orthopedics;  Laterality: Left;     Family  History  Problem Relation Age of Onset   Strabismus Daughter     Social History:  reports that he quit smoking about 5 years ago. His smoking use included cigarettes. He has a 120.00 pack-year smoking history. He has never used smokeless tobacco. He reports that he does not drink alcohol and does not use drugs.  Allergies: No Known Allergies  Medications: I have reviewed the patient's current medications.  Results for orders placed or performed during the hospital encounter of 09/23/21 (from the past 48 hour(s))  POC CBG, ED     Status: Abnormal   Collection Time: 09/23/21  8:23 PM  Result Value Ref Range   Glucose-Capillary 219 (H) 70 - 99 mg/dL    Comment: Glucose reference range applies only to samples taken after fasting for at least 8 hours.   Comment 1 Notify RN    Comment 2 Document in Chart   Lactic acid, plasma     Status: Abnormal   Collection Time: 09/23/21  8:27 PM  Result Value Ref Range   Lactic Acid, Venous 2.0 (HH) 0.5 - 1.9 mmol/L    Comment: CRITICAL RESULT CALLED TO, READ BACK BY AND VERIFIED WITH: Delfina Redwood RN 09/23/21 [address] Performed at [facility] Lab, [address], [place], [place] [address]   Comprehensive metabolic panel  Status: Abnormal   Collection Time: 09/23/21  8:27 PM  Result Value Ref Range   Sodium 140 135 - 145 mmol/L   Potassium 3.9 3.5 - 5.1 mmol/L   Chloride 101 98 - 111 mmol/L   CO2 27 22 - 32 mmol/L   Glucose, Bld 231 (H) 70 - 99 mg/dL    Comment: Glucose reference range applies only to samples taken after fasting for at least 8 hours.   BUN 17 8 - 23 mg/dL   Creatinine, Ser 6.23 (H) 0.61 - 1.24 mg/dL   Calcium 9.0 8.9 - 76.2 mg/dL   Total Protein 6.8 6.5 - 8.1 g/dL   Albumin 3.5 3.5 - 5.0 g/dL   AST 46 (H) 15 - 41 U/L   ALT 54 (H) 0 - 44 U/L   Alkaline Phosphatase 62 38 - 126 U/L   Total Bilirubin 0.9 0.3 - 1.2 mg/dL   GFR, Estimated 52 (L) >60 mL/min    Comment: (NOTE) Calculated using the CKD-EPI  Creatinine Equation (2021)    Anion gap 12 5 - 15    Comment: Performed at [facility], [address], [place], [place] [address]  CBC WITH DIFFERENTIAL     Status: Abnormal   Collection Time: 09/23/21  8:27 PM  Result Value Ref Range   WBC 18.0 (H) 4.0 - 10.5 K/uL   RBC 5.07 4.22 - 5.81 MIL/uL   Hemoglobin 15.7 13.0 - 17.0 g/dL   HCT 76.1 60.7 - 37.1 %   MCV 95.1 80.0 - 100.0 fL   MCH 31.0 26.0 - 34.0 pg   MCHC 32.6 30.0 - 36.0 g/dL   RDW 06.2 69.4 - 85.4 %   Platelets 201 150 - 400 K/uL   nRBC 0.0 0.0 - 0.2 %   Neutrophils Relative % 93 %   Neutro Abs 16.6 (H) 1.7 - 7.7 K/uL   Lymphocytes Relative 1 %   Lymphs Abs 0.2 (L) 0.7 - 4.0 K/uL   Monocytes Relative 6 %   Monocytes Absolute 1.0 0.1 - 1.0 K/uL   Eosinophils Relative 0 %   Eosinophils Absolute 0.0 0.0 - 0.5 K/uL   Basophils Relative 0 %   Basophils Absolute 0.0 0.0 - 0.1 K/uL   Immature Granulocytes 0 %   Abs Immature Granulocytes 0.08 (H) 0.00 - 0.07 K/uL    Comment: Performed at [facility], [address] [place], [place] [address]  Protime-INR     Status: None   Collection Time: 09/23/21  8:27 PM  Result Value Ref Range   Prothrombin Time 12.1 11.4 - 15.2 seconds   INR 0.9 0.8 - 1.2    Comment: (NOTE) INR goal varies based on device and disease states. Performed at [facility], [address] [place], [place] [address]   APTT     Status: None   Collection Time: 09/23/21  8:27 PM  Result Value Ref Range   aPTT 25 24 - 36 seconds    Comment: Performed at [facility], [address] [place], [place] [address]  Resp Panel by RT-PCR (Flu A&B, Covid) Nasopharyngeal Swab     Status: None   Collection Time: 09/23/21  8:41 PM   Specimen: Nasopharyngeal Swab; Nasopharyngeal(NP) swabs in vial transport medium  Result Value Ref Range   SARS Coronavirus 2 by RT PCR NEGATIVE NEGATIVE    Comment: (NOTE) SARS-CoV-2 target nucleic acids are NOT DETECTED.  The SARS-CoV-2 RNA is generally  detectable in  upper respiratory specimens during the acute phase of infection. The lowest concentration of SARS-CoV-2 viral copies this assay can detect is 138 copies/mL. A negative result does not preclude SARS-Cov-2 infection and should not be used as the sole basis for treatment or other patient management decisions. A negative result may occur with  improper specimen collection/handling, submission of specimen other than nasopharyngeal swab, presence of viral mutation(s) within the areas targeted by this assay, and inadequate number of viral copies(<138 copies/mL). A negative result must be combined with clinical observations, patient history, and epidemiological information. The expected result is Negative.  Fact Sheet for Patients:  BloggerCourse.com  Fact Sheet for Healthcare Providers:  SeriousBroker.it  This test is no t yet approved or cleared by the [place] FDA and  has been authorized for detection and/or diagnosis of SARS-CoV-2 by FDA under an Emergency Use Authorization (EUA). This EUA will remain  in effect (meaning this test can be used) for the duration of the COVID-19 declaration under Section 564(b)(1) of the Act, 21 U.S.C.section 360bbb-3(b)(1), unless the authorization is terminated  or revoked sooner.       Influenza A by PCR NEGATIVE NEGATIVE   Influenza B by PCR NEGATIVE NEGATIVE    Comment: (NOTE) The Xpert Xpress SARS-CoV-2/FLU/RSV plus assay is intended as an aid in the diagnosis of influenza from Nasopharyngeal swab specimens and should not be used as a sole basis for treatment. Nasal washings and aspirates are unacceptable for Xpert Xpress SARS-CoV-2/FLU/RSV testing.  Fact Sheet for Patients: BloggerCourse.com  Fact Sheet for Healthcare Providers: SeriousBroker.it  This test is not yet approved or cleared by the [place] FDA and has  been authorized for detection and/or diagnosis of SARS-CoV-2 by FDA under an Emergency Use Authorization (EUA). This EUA will remain in effect (meaning this test can be used) for the duration of the COVID-19 declaration under Section 564(b)(1) of the Act, 21 U.S.C. section 360bbb-3(b)(1), unless the authorization is terminated or revoked.  Performed at [facility], [address], [place], [place] [address]     DG Chest 1 View  Result Date: 09/23/2021 CLINICAL DATA:  Abdominal pain, vomiting EXAM: CHEST  1 VIEW COMPARISON:  06/14/2020 FINDINGS: Mild patchy bilateral lower lobe opacities, some of which may reflect scarring in the left lower lobe,, with superimposed bilateral lower lobe atelectasis favored over pneumonia. No frank interstitial edema. No pleural effusion or pneumothorax. Heart is normal in size. IMPRESSION: Bilateral lower lobe opacities, favoring scarring/atelectasis. Electronically Signed   By: Charline Bills M.D.   On: 09/23/2021 20:48   CT ABDOMEN PELVIS W CONTRAST  Result Date: 09/23/2021 CLINICAL DATA:  Abdominal pain with vomiting and diarrhea for 2 days, initial encounter EXAM: CT ABDOMEN AND PELVIS WITH CONTRAST TECHNIQUE: Multidetector CT imaging of the abdomen and pelvis was performed using the standard protocol following bolus administration of intravenous contrast. CONTRAST:  OMNIPAQUE IOHEXOL 300 MG/ML  SOLN COMPARISON:  07/04/2021 FINDINGS: Lower chest: Bibasilar atelectasis is noted superimposed over some scarring seen on the prior examination. Hepatobiliary: Liver is within normal limits. Small hypodensity is noted stable from the prior exam which may represent a small hemangioma or cyst. The gallbladder is well distended with evidence of pericholecystic fluid and mild inflammatory change suspicious for acute cholecystitis. These inflammatory changes extend around the second portion of the duodenum as well as the head of the pancreas and inferiorly  towards the hepatic flexure. Pancreas: Pancreas is well visualized with some fatty interdigitation. Mild inflammatory changes are  seen although they are felt to be secondary to the changes in the gallbladder. Spleen: Normal in size without focal abnormality. Adrenals/Urinary Tract: Adrenal glands are within normal limits. Bilateral renal cystic change is seen similar to that noted on prior exams. Nonobstructing renal calculi are again seen in the lower pole of the right kidney stable from the prior exam. No ureteral stones are seen. The bladder is partially distended. Stomach/Bowel: Mild diverticular change of the colon is noted worst in the sigmoid colon without definitive changes of diverticulitis. The appendix is within normal limits. Small bowel and stomach appear within normal limits aside from the inflammatory changes surrounding the second portion of the duodenum again felt to be related to the gallbladder. Vascular/Lymphatic: Aortic atherosclerosis. The abdominal aorta is dilated to 3 cm in the infrarenal segment. No enlarged abdominal or pelvic lymph nodes. Reproductive: Prostate is unremarkable. Other: No abdominal wall hernia or abnormality. No abdominopelvic ascites. Musculoskeletal: No acute or significant osseous findings. IMPRESSION: Inflammatory changes in the right upper quadrant surrounding primarily the gallbladder but extending around the second portion of the duodenum and towards the pancreatic head. These changes are suspicious for acute cholecystitis. Ultrasound would be helpful for further evaluation. Nonobstructing right renal calculi as well as bilateral renal cysts stable from prior study. Diverticulosis without diverticulitis. 3 cm infrarenal abdominal aortic aneurysm. Recommend follow-up every 3 years. Reference: J Am Coll Radiol 2013;10:789-794. Aortic Atherosclerosis (ICD10-I70.0). Electronically Signed   By: Alcide Clever M.D.   On: 09/23/2021 23:48    Review of Systems   Constitutional: Negative.   HENT: Negative.    Eyes: Negative.   Respiratory: Negative.    Cardiovascular: Negative.   Gastrointestinal:  Positive for abdominal pain, nausea and vomiting.  Genitourinary: Negative.   Musculoskeletal: Negative.   Skin: Negative.   Neurological: Negative.   Endo/Heme/Allergies: Negative.   Psychiatric/Behavioral: Negative.     PE Blood pressure 137/72, pulse 100, temperature 99.7 F (37.6 C), temperature source Oral, resp. rate 19, height 6' (1.829 m), weight 96.2 kg, SpO2 93 %. Constitutional: NAD; conversant; no deformities Eyes: Moist conjunctiva; no lid lag; anicteric; PERRL Neck: Trachea midline; no thyromegaly Lungs: Normal respiratory effort; no tactile fremitus CV: RRR; no palpable thrills; no pitting edema GI: Abd TTP RUQ; no palpable hepatosplenomegaly MSK: Normal gait; no clubbing/cyanosis Psychiatric: Appropriate affect; alert and oriented x3 Lymphatic: No palpable cervical or axillary lymphadenopathy Skin: No major subcutaneous nodules. Warm and dry   Assessment/Plan: 85 yo male with upper abdominal pain and leukocytosis and vomiting. CT scan concerning for dilated and thickened gallbladder, also noted is stranding around duodenum. -I agree that pain is most likely from gallbladder. We will plan for lap cholecystectomy in the morning -zosyn for antibiotics -PPI in case this dark vomit is gastritis/ulcer  De Blanch Vala Raffo 09/24/2021, 1:25 AM

## 2021-09-24 NOTE — ED Notes (Signed)
Pt requesting something to drink. Spoke with provider and advised pt is NPO until seen by surgery

## 2021-09-24 NOTE — Progress Notes (Signed)
FPTS Interim Progress Note  S: Reports pain is a little better. Wife at bedside with him in the ED.  O: BP 129/61   Pulse 91   Temp 99.7 F (37.6 C) (Oral)   Resp (!) 24   Ht 6' (1.829 m)   Wt 96.2 kg   SpO2 95%   BMI 28.75 kg/m   General: Alert, hard of hearing, NAD Cardiovascular: RRR, no murmurs Respiratory: Clear to auscultation bilaterally GI: Soft, diffusely tender but worse in the RUQ, he has tenderness with percussion to the RUQ Ext: No edema  A/P: Acute cholecystitis He is continued on broad-spectrum antibiotics, will likely go for lap cholecystectomy later this morning per general surgery.  He remains n.p.o., IV fluids ordered as he is n.p.o.  RUQ [place] resulted overnight which revealed well distended gallbladder with gallstones and wall thickening.  Additionally, lipase is elevated which is likely from gallstones.  He does not drink alcohol.  No other changes to current plan.  Reorder home medications as appropriate once formal med rec is completed.  Family member is to bring medications sometime today.  Littie Deeds, MD 09/24/2021, 6:20 AM PGY-2, [facility] Family Medicine Service pager [phone]

## 2021-09-24 NOTE — Transfer of Care (Signed)
Immediate Anesthesia Transfer of Care Note  Patient: Joshua [name]  Procedure(s) Performed: LAPAROSCOPIC CHOLECYSTECTOMY  Patient Location: PACU  Anesthesia Type:General  Level of Consciousness: drowsy and patient cooperative  Airway & Oxygen Therapy: Patient Spontanous Breathing and Patient connected to nasal cannula oxygen  Post-op Assessment: Report given to RN and Post -op Vital signs reviewed and stable  Post vital signs: Reviewed and stable  Last Vitals:  Vitals Value Taken Time  BP 156/71 09/24/21 1201  Temp    Pulse 95 09/24/21 1204  Resp 19 09/24/21 1204  SpO2 92 % 09/24/21 1204  Vitals shown include unvalidated device data.  Last Pain:  Vitals:   09/24/21 1040  TempSrc: Oral  PainSc:          Complications: No notable events documented.

## 2021-09-24 NOTE — Progress Notes (Signed)
Patient examined in preop area. Reports upper abdominal pain that began 2 days ago. CT shows cholecystitis. RUQ [place] with gallbladder wall thickening. Proceed to OR for lap cholecystectomy. Procedure details were discussed with the patient and he agrees to proceed. Informed consent obtained.  Joshua Simas, MD [facility] General, Hepatobiliary and Pancreatic Surgery 09/24/21 10:40 AM

## 2021-09-24 NOTE — Progress Notes (Signed)
CBG taken in PACU, CBG results were 118.

## 2021-09-25 ENCOUNTER — Inpatient Hospital Stay (HOSPITAL_COMMUNITY): Payer: Medicare Other

## 2021-09-25 ENCOUNTER — Encounter (HOSPITAL_COMMUNITY): Payer: Self-pay | Admitting: Surgery

## 2021-09-25 DIAGNOSIS — K819 Cholecystitis, unspecified: Secondary | ICD-10-CM

## 2021-09-25 LAB — HEMOGLOBIN A1C
Hgb A1c MFr Bld: 7.2 % — ABNORMAL HIGH (ref 4.8–5.6)
Mean Plasma Glucose: 159.94 mg/dL

## 2021-09-25 LAB — GLUCOSE, CAPILLARY
Glucose-Capillary: 119 mg/dL — ABNORMAL HIGH (ref 70–99)
Glucose-Capillary: 129 mg/dL — ABNORMAL HIGH (ref 70–99)
Glucose-Capillary: 121 mg/dL — ABNORMAL HIGH (ref 70–99)
Glucose-Capillary: 112 mg/dL — ABNORMAL HIGH (ref 70–99)

## 2021-09-25 LAB — COMPREHENSIVE METABOLIC PANEL
ALT: 39 U/L (ref 0–44)
AST: 42 U/L — ABNORMAL HIGH (ref 15–41)
Albumin: 2.4 g/dL — ABNORMAL LOW (ref 3.5–5.0)
Alkaline Phosphatase: 35 U/L — ABNORMAL LOW (ref 38–126)
Anion gap: 12 (ref 5–15)
BUN: 31 mg/dL — ABNORMAL HIGH (ref 8–23)
CO2: 25 mmol/L (ref 22–32)
Calcium: 8.4 mg/dL — ABNORMAL LOW (ref 8.9–10.3)
Chloride: 100 mmol/L (ref 98–111)
Creatinine, Ser: 1.96 mg/dL — ABNORMAL HIGH (ref 0.61–1.24)
GFR, Estimated: 33 mL/min — ABNORMAL LOW (ref >60)
Glucose, Bld: 129 mg/dL — ABNORMAL HIGH (ref 70–99)
Potassium: 4.1 mmol/L (ref 3.5–5.1)
Sodium: 137 mmol/L (ref 135–145)
Total Bilirubin: 1.1 mg/dL (ref 0.3–1.2)
Total Protein: 5.7 g/dL — ABNORMAL LOW (ref 6.5–8.1)

## 2021-09-25 LAB — CBC
HCT: 42 % (ref 39.0–52.0)
Hemoglobin: 13.5 g/dL (ref 13.0–17.0)
MCH: 30.9 pg (ref 26.0–34.0)
MCHC: 32.1 g/dL (ref 30.0–36.0)
MCV: 96.1 fL (ref 80.0–100.0)
Platelets: 163 10*3/uL (ref 150–400)
RBC: 4.37 MIL/uL (ref 4.22–5.81)
RDW: 14 % (ref 11.5–15.5)
WBC: 23.9 10*3/uL — ABNORMAL HIGH (ref 4.0–10.5)
nRBC: 0 % (ref 0.0–0.2)

## 2021-09-25 LAB — BASIC METABOLIC PANEL
Anion gap: 8 (ref 5–15)
BUN: 30 mg/dL — ABNORMAL HIGH (ref 8–23)
CO2: 26 mmol/L (ref 22–32)
Calcium: 8.4 mg/dL — ABNORMAL LOW (ref 8.9–10.3)
Chloride: 101 mmol/L (ref 98–111)
Creatinine, Ser: 1.79 mg/dL — ABNORMAL HIGH (ref 0.61–1.24)
GFR, Estimated: 37 mL/min — ABNORMAL LOW (ref >60)
Glucose, Bld: 113 mg/dL — ABNORMAL HIGH (ref 70–99)
Potassium: 3.9 mmol/L (ref 3.5–5.1)
Sodium: 135 mmol/L (ref 135–145)

## 2021-09-25 LAB — BRAIN NATRIURETIC PEPTIDE: B Natriuretic Peptide: 106.3 pg/mL — ABNORMAL HIGH (ref 0.0–100.0)

## 2021-09-25 MED ORDER — ACETAMINOPHEN 500 MG PO TABS: 1000.0000 mg | ORAL_TABLET | Freq: Four times a day (QID) | ORAL | 0 refills | Status: AC | PRN

## 2021-09-25 MED ORDER — TRAMADOL HCL 50 MG PO TABS: 50.0000 mg | ORAL_TABLET | Freq: Four times a day (QID) | ORAL | 0 refills | Status: DC | PRN

## 2021-09-25 MED ORDER — FUROSEMIDE 10 MG/ML IJ SOLN
20.0000 mg | Freq: Once | INTRAMUSCULAR | Status: AC
  Administered 2021-09-25: 20 mg via INTRAVENOUS
  Filled 2021-09-25: qty 2

## 2021-09-25 MED ORDER — ACETAMINOPHEN 500 MG PO TABS
1000.0000 mg | ORAL_TABLET | Freq: Four times a day (QID) | ORAL | Status: DC
  Administered 2021-09-25 – 2021-09-29 (×12): 1000 mg via ORAL
  Filled 2021-09-25 (×15): qty 2

## 2021-09-25 MED ORDER — PANTOPRAZOLE SODIUM 40 MG PO TBEC
40.0000 mg | DELAYED_RELEASE_TABLET | Freq: Every day | ORAL | Status: DC
  Administered 2021-09-26 – 2021-09-29 (×4): 40 mg via ORAL
  Filled 2021-09-25 (×4): qty 1

## 2021-09-25 MED ORDER — TRAMADOL HCL 50 MG PO TABS
50.0000 mg | ORAL_TABLET | Freq: Four times a day (QID) | ORAL | Status: DC | PRN
  Administered 2021-09-25 – 2021-09-26 (×2): 50 mg via ORAL
  Filled 2021-09-25 (×2): qty 1

## 2021-09-25 MED ORDER — ONDANSETRON HCL 4 MG/2ML IJ SOLN
4.0000 mg | Freq: Four times a day (QID) | INTRAMUSCULAR | Status: DC | PRN
  Administered 2021-09-25: 4 mg via INTRAVENOUS
  Filled 2021-09-25: qty 2

## 2021-09-25 MED ORDER — AMOXICILLIN-POT CLAVULANATE 875-125 MG PO TABS
1.0000 | ORAL_TABLET | Freq: Two times a day (BID) | ORAL | Status: DC
  Administered 2021-09-25 – 2021-09-29 (×8): 1 via ORAL
  Filled 2021-09-25 (×8): qty 1

## 2021-09-25 MED ORDER — IPRATROPIUM-ALBUTEROL 0.5-2.5 (3) MG/3ML IN SOLN
3.0000 mL | Freq: Once | RESPIRATORY_TRACT | Status: AC
  Administered 2021-09-25: 3 mL via RESPIRATORY_TRACT
  Filled 2021-09-25: qty 3

## 2021-09-25 MED ORDER — HEPARIN SODIUM (PORCINE) 5000 UNIT/ML IJ SOLN
5000.0000 [IU] | Freq: Three times a day (TID) | INTRAMUSCULAR | Status: DC
  Administered 2021-09-25 – 2021-09-27 (×7): 5000 [IU] via SUBCUTANEOUS
  Filled 2021-09-25 (×7): qty 1

## 2021-09-25 NOTE — Anesthesia Postprocedure Evaluation (Signed)
Anesthesia Post Note  Patient: Joshua [name]  Procedure(s) Performed: LAPAROSCOPIC CHOLECYSTECTOMY     Patient location during evaluation: PACU Anesthesia Type: General Level of consciousness: sedated Pain management: pain level controlled Vital Signs Assessment: post-procedure vital signs reviewed and stable Respiratory status: spontaneous breathing and respiratory function stable Cardiovascular status: stable Postop Assessment: no apparent nausea or vomiting Anesthetic complications: no   No notable events documented.                [name] DANIEL

## 2021-09-25 NOTE — Progress Notes (Signed)
FPTS Brief Progress Note  S:Pt continues to have shortness of breath and requiring supplemental oxygen. No O2 requirement at home. Daughter is by the bedside. Pt continues to have nausea. Denies chest pain.    O: BP (!) 139/58 (BP Location: Right Arm)   Pulse 79   Temp 98.4 F (36.9 C) (Oral)   Resp 18   Ht 6' (1.829 m)   Wt 96.2 kg   SpO2 96%   BMI 28.75 kg/m    GEN: resting in bed in no acute distress  CVS: RRR, no JVD RESP: wearing 3 L [place], bibasilar rales EXT: no LE edema   A/P: Acute Respiratory Failure  Today's CXR showing cardiomegaly. On exam, pt with bibasilar rales. Discontinue IVF. Give 20 mg IV Lasix. Will need to monitor Cr closely. Obtain BMP and BNP.  - Consider ECHO in AM - Orders reviewed. Labs for AM ordered, which was adjusted as needed.    Katha Cabal, DO 09/25/2021, 10:28 PM PGY-3, [place] [facility] Night Resident  Please page [phone] with questions.

## 2021-09-25 NOTE — Evaluation (Signed)
Physical Therapy Evaluation Patient Details Name: Joshua [name] MRN: [id number] DOB: [birth year] Today's Date: 09/25/2021  History of Present Illness  85 y.o. male presenting with abdominal pain likely secondary to cholecystitis. 09/24/21 Laparoscopic cholecystectomy  PMH is significant for T2DM, COPD, HLD, HTN  Clinical Impression   Pt admitted secondary to problem above with deficits below. PTA patient was ambulating with or without RW inside home and used RW if outside home. He lives with his wife. Pt currently requires Min assist for basic mobility and was unable to ambulate more than 3 ft due to pain and dyspnea on 4L O2 (pulse ox not registering).  Anticipate patient will benefit from PT to address problems listed below.Will continue to follow acutely to maximize functional mobility independence and safety.          Recommendations for follow up therapy are one component of a multi-disciplinary discharge planning process, led by the attending physician.  Recommendations may be updated based on patient status, additional functional criteria and insurance authorization.  Follow Up Recommendations Home health PT;Supervision for mobility/OOB    Equipment Recommendations  None recommended by PT    Recommendations for Other Services       Precautions / Restrictions Precautions Precautions: Fall      Mobility  Bed Mobility Overal bed mobility: Needs Assistance Bed Mobility: Rolling;Sidelying to Sit Rolling: Supervision Sidelying to sit: Min assist       General bed mobility comments: vc for rolling technique with rail; assist to raise torso with cues for sequence; cues for scoot out to EOB    Transfers Overall transfer level: Needs assistance Equipment used: Rolling walker (2 wheeled) Transfers: Sit to/from Stand Sit to Stand: Min assist         General transfer comment: assist with forward translation and vertical boost from bed and from  recliner  Ambulation/Gait Ambulation/Gait assistance: Min assist Gait Distance (Feet): 3 Feet Assistive device: Rolling walker (2 wheeled) Gait Pattern/deviations: Step-to pattern     General Gait Details: small steps from bed to recliner  Stairs            Wheelchair Mobility    Modified Rankin (Stroke Patients Only)       Balance Overall balance assessment: Needs assistance Sitting-balance support: No upper extremity supported;Feet supported Sitting balance-Leahy Scale: Good     Standing balance support: Bilateral upper extremity supported Standing balance-Leahy Scale: Poor                               Pertinent Vitals/Pain Pain Assessment: Faces Faces Pain Scale: Hurts even more Pain Location: abd Pain Descriptors / Indicators: Grimacing;Guarding Pain Intervention(s): Limited activity within patient's tolerance;Monitored during session;Other (comment) (taught to use pillow to brace abd in sitting)    Home Living Family/patient expects to be discharged to:: Private residence Living Arrangements: Spouse/significant other Available Help at Discharge: Family;Available 24 hours/day Type of Home: House Home Access: Stairs to enter Entrance Stairs-Rails: Right Entrance Stairs-Number of Steps: 3 Home Layout: One level Home Equipment: Walker - 2 wheels;Shower seat      Prior Function Level of Independence: Needs assistance   Gait / Transfers Assistance Needed: inside uses no device usually; outside either use RW or pushes a wheelbarrow (with a folded chair in it in case he needs to sit down because of his back pain)  ADL's / Homemaking Assistance Needed: sits on shower seat in tub/shower  Hand Dominance   Dominant Hand: Right    Extremity/Trunk Assessment   Upper Extremity Assessment Upper Extremity Assessment: Defer to OT evaluation    Lower Extremity Assessment Lower Extremity Assessment: Generalized weakness    Cervical /  Trunk Assessment Cervical / Trunk Assessment: Kyphotic  Communication   Communication: HOH  Cognition Arousal/Alertness: Awake/alert Behavior During Therapy: WFL for tasks assessed/performed Overall Cognitive Status: Within Functional Limits for tasks assessed                                        General Comments General comments (skin integrity, edema, etc.): on 4L O2 with pulse ox not working; very dyspneic with activity--pt reports normal for him due to COPD    Exercises     Assessment/Plan    PT Assessment Patient needs continued PT services  PT Problem List Decreased strength;Decreased activity tolerance;Decreased balance;Decreased mobility;Decreased knowledge of use of DME;Decreased knowledge of precautions;Cardiopulmonary status limiting activity;Pain       PT Treatment Interventions DME instruction;Gait training;Stair training;Functional mobility training;Therapeutic activities;Therapeutic exercise;Balance training;Patient/family education    PT Goals (Current goals can be found in the Care Plan section)  Acute Rehab PT Goals Patient Stated Goal: feel better and go home PT Goal Formulation: With patient Time For Goal Achievement: 10/09/21 Potential to Achieve Goals: Good    Frequency Min 3X/week   Barriers to discharge        Co-evaluation               AM-PAC PT "6 Clicks" Mobility  Outcome Measure Help needed turning from your back to your side while in a flat bed without using bedrails?: A Little Help needed moving from lying on your back to sitting on the side of a flat bed without using bedrails?: A Little Help needed moving to and from a bed to a chair (including a wheelchair)?: A Little Help needed standing up from a chair using your arms (e.g., wheelchair or bedside chair)?: A Little Help needed to walk in hospital room?: A Little Help needed climbing 3-5 steps with a railing? : A Little 6 Click Score: 18    End of Session  Equipment Utilized During Treatment: Gait belt;Oxygen Activity Tolerance: Patient limited by pain Patient left: in chair;with call bell/phone within reach;with chair alarm set Nurse Communication: Mobility status PT Visit Diagnosis: Muscle weakness (generalized) (M62.81);Difficulty in walking, not elsewhere classified (R26.2);Pain Pain - Right/Left:  (midline) Pain - part of body:  (abd)    Time: 3532-9924 PT Time Calculation (min) (ACUTE ONLY): 27 min   Charges:   PT Evaluation $PT Eval Moderate Complexity: 1 Mod PT Treatments $Therapeutic Activity: 8-22 mins         Jerolyn Center, PT Acute Rehabilitation Services  Pager [phone] Office [phone]   Zena Amos 09/25/2021, 12:10 PM

## 2021-09-25 NOTE — Progress Notes (Addendum)
1 Day Post-Op  Subjective: Pain best controlled with tylenol.  Hasn't been up out of bed yet.  C/o baseline back pain from herniated disc.  Tolerating some solid food.  No nausea.  Some leakage from lateral port site.  ROS: See above, otherwise other systems negative  Objective: Vital signs in last 24 hours: Temp:  [97.7 F (36.5 C)-99.8 F (37.7 C)] 98.3 F (36.8 C) (10/17 0844) Pulse Rate:  [86-103] 93 (10/17 0844) Resp:  [14-24] 19 (10/17 0844) BP: (125-171)/(69-80) 137/76 (10/17 0844) SpO2:  [90 %-100 %] 92 % (10/17 0400)    Intake/Output from previous day: 10/16 0701 - 10/17 0700 In: 1200 [I.V.:1200] Out: 760 [Urine:750; Blood:10] Intake/Output this shift: No intake/output data recorded.  PE: Abd: soft, appropriately tender, +BS, ND, incisions c/d/I.  No currently leakage, but leakage noted on gown from slightly dehiscence of small 70mm trocar site.  This is healing over already.  Lab Results:  Recent Labs    09/23/21 2027 09/24/21 0254  WBC 18.0* 20.0*  HGB 15.7 14.8  HCT 48.2 45.9  PLT 201 259   BMET Recent Labs    09/23/21 2027 09/24/21 0254  NA 140 141  K 3.9 4.2  CL 101 105  CO2 27 25  GLUCOSE 231* 148*  BUN 17 19  CREATININE 1.36* 1.42*  CALCIUM 9.0 8.5*   PT/INR Recent Labs    09/23/21 2027  LABPROT 12.1  INR 0.9   CMP     Component Value Date/Time   NA 141 09/24/2021 0254   K 4.2 09/24/2021 0254   CL 105 09/24/2021 0254   CO2 25 09/24/2021 0254   GLUCOSE 148 (H) 09/24/2021 0254   BUN 19 09/24/2021 0254   CREATININE 1.42 (H) 09/24/2021 0254   CALCIUM 8.5 (L) 09/24/2021 0254   PROT 6.1 (L) 09/24/2021 0254   ALBUMIN 2.9 (L) 09/24/2021 0254   AST 32 09/24/2021 0254   ALT 40 09/24/2021 0254   ALKPHOS 46 09/24/2021 0254   BILITOT 0.9 09/24/2021 0254   GFRNONAA 49 (L) 09/24/2021 0254   GFRAA 44 (L) 07/02/2020 0816   Lipase     Component Value Date/Time   LIPASE 415 (H) 09/24/2021 0254       Studies/Results: DG Chest 1  View  Result Date: 09/23/2021 CLINICAL DATA:  Abdominal pain, vomiting EXAM: CHEST  1 VIEW COMPARISON:  06/14/2020 FINDINGS: Mild patchy bilateral lower lobe opacities, some of which may reflect scarring in the left lower lobe,, with superimposed bilateral lower lobe atelectasis favored over pneumonia. No frank interstitial edema. No pleural effusion or pneumothorax. Heart is normal in size. IMPRESSION: Bilateral lower lobe opacities, favoring scarring/atelectasis. Electronically Signed   By: Charline Bills M.D.   On: 09/23/2021 20:48   CT ABDOMEN PELVIS W CONTRAST  Result Date: 09/23/2021 CLINICAL DATA:  Abdominal pain with vomiting and diarrhea for 2 days, initial encounter EXAM: CT ABDOMEN AND PELVIS WITH CONTRAST TECHNIQUE: Multidetector CT imaging of the abdomen and pelvis was performed using the standard protocol following bolus administration of intravenous contrast. CONTRAST:  OMNIPAQUE IOHEXOL 300 MG/ML  SOLN COMPARISON:  07/04/2021 FINDINGS: Lower chest: Bibasilar atelectasis is noted superimposed over some scarring seen on the prior examination. Hepatobiliary: Liver is within normal limits. Small hypodensity is noted stable from the prior exam which may represent a small hemangioma or cyst. The gallbladder is well distended with evidence of pericholecystic fluid and mild inflammatory change suspicious for acute cholecystitis. These inflammatory changes extend around the second  portion of the duodenum as well as the head of the pancreas and inferiorly towards the hepatic flexure. Pancreas: Pancreas is well visualized with some fatty interdigitation. Mild inflammatory changes are seen although they are felt to be secondary to the changes in the gallbladder. Spleen: Normal in size without focal abnormality. Adrenals/Urinary Tract: Adrenal glands are within normal limits. Bilateral renal cystic change is seen similar to that noted on prior exams. Nonobstructing renal calculi are again seen in  the lower pole of the right kidney stable from the prior exam. No ureteral stones are seen. The bladder is partially distended. Stomach/Bowel: Mild diverticular change of the colon is noted worst in the sigmoid colon without definitive changes of diverticulitis. The appendix is within normal limits. Small bowel and stomach appear within normal limits aside from the inflammatory changes surrounding the second portion of the duodenum again felt to be related to the gallbladder. Vascular/Lymphatic: Aortic atherosclerosis. The abdominal aorta is dilated to 3 cm in the infrarenal segment. No enlarged abdominal or pelvic lymph nodes. Reproductive: Prostate is unremarkable. Other: No abdominal wall hernia or abnormality. No abdominopelvic ascites. Musculoskeletal: No acute or significant osseous findings. IMPRESSION: Inflammatory changes in the right upper quadrant surrounding primarily the gallbladder but extending around the second portion of the duodenum and towards the pancreatic head. These changes are suspicious for acute cholecystitis. Ultrasound would be helpful for further evaluation. Nonobstructing right renal calculi as well as bilateral renal cysts stable from prior study. Diverticulosis without diverticulitis. 3 cm infrarenal abdominal aortic aneurysm. Recommend follow-up every 3 years. Reference: J Am Coll Radiol 2013;10:789-794. Aortic Atherosclerosis (ICD10-I70.0). Electronically Signed   By: Alcide Clever M.D.   On: 09/23/2021 23:48   US Abdomen Limited RUQ (LIVER/GB)  Result Date: 09/24/2021 CLINICAL DATA:  Right upper quadrant pain EXAM: ULTRASOUND ABDOMEN LIMITED RIGHT UPPER QUADRANT COMPARISON:  CT from the previous day. FINDINGS: Gallbladder: Gallbladder is well distended with multiple stones. Gallbladder wall is thickened to 5.6 mm. Negative sonographic Murphy's sign is elicited however. Common bile duct: Diameter: 6.4 mm Liver: No focal lesion identified. Within normal limits in parenchymal  echogenicity. Portal vein is patent on color Doppler imaging with normal direction of blood flow towards the liver. Other: Upper pole right renal cyst similar to that seen on prior CT. IMPRESSION: Well distended gallbladder with gallstones. Gallbladder wall thickening is seen although a negative sonographic Murphy's sign is elicited. HIDA scan may be helpful to assess for gallbladder function as clinically necessary. Electronically Signed   By: Alcide Clever M.D.   On: 09/24/2021 02:14    Anti-infectives: Anti-infectives (From admission, onward)    Start     Dose/Rate Route Frequency Ordered Stop   09/24/21 0800  piperacillin-tazobactam (ZOSYN) IVPB 3.375 g        3.375 g 12.5 mL/hr over 240 Minutes Intravenous Every 8 hours 09/24/21 0144 09/25/21 1205   09/24/21 0030  piperacillin-tazobactam (ZOSYN) IVPB 3.375 g        3.375 g 100 mL/hr over 30 Minutes Intravenous  Once 09/24/21 0027 09/24/21 0228        Assessment/Plan POD 1, s/p lap chole for gangrenous cholecystitis by Dr. Freida Busman 10/16 -needs to mobilize.  After discussing with patient and family, he surfs at home between the walls, the counter, etc.  Likely needs PT assessment.  Will defer to primary service -carb mod diet -pain controlled well with Tylenol.  Oxy too strong, decrease to tramadol if needed. -repeat labs pending for today -surgically he is stable  for DC home, but will defer to medicine when he is medically stable for DC home -follow up with our office being arranged -tramadol sent to pharmacy   FEN - carb mod VTE - heparin TID ID - zosyn, can treat for a total of 5 days post op.  Can switch to augmentin to complete regimen at discharge  AKI DM COPD HTN  ADDENDUM: WBC 24K today which is up further likely secondary to post op state.  Given he needs PT eval, etc. Can recheck in am and assure this is trending down.  He otherwise clinically looks very stable.   LOS: 1 day    Letha Cape , [facility] 09/25/2021, 9:29 AM Please see Amion for pager number during day hours 7:00am-4:30pm or 7:00am -11:30am on weekends

## 2021-09-25 NOTE — Progress Notes (Signed)
Family Medicine Teaching Service Daily Progress Note Intern Pager: [phone]  Patient name: Joshua [name] Medical record number: [id number] Date of birth: 1936/03/13 Age: 85 y.o. Gender: male  Primary Care Provider: Kaleen Mask, MD Consultants: General surgery Code Status: Full  Pt Overview and Major Events to Date:  09/24/2021-admitted 09/24/2021-cholecystectomy  Assessment and Plan: Joshua [name] is an 85 year old male with PMH of type 2 diabetes, COPD, HLD, HTN admitted for cholecystitis and is s/p cholecystectomy  Acute cholecystitis s/p cholecystectomy After 2 days of abdominal pain, abdominal CT showed distention and edema of gallbladder suggestive of cholecystitis.  Antibiotics were initiated and patient had cholecystectomy yesterday and was found to have gangrenous cholecystitis. CBC of 23.9 (up from 20) this morning. Also had hernia repaired. From surgical standpoint, can discharge but needs PT eval.  -Tramadol 50 mg q6h prn -Tylenol 1,000 mg q6h -D/C Zosyn - Start PO augmentin  -a.m. CBC -PT eval  AKI Cr 1.42>1.96 today. was 1.17 two years ago. GFR 49>33 today. This could be d/t receiving contrast two days ago for CT.  - continue LR 100 mL/hr -a.m. BMP  Type 2 diabetes Glucose today is 119. Ha1c of 7.2 today.   -Sensitive SSI  COPD On 3 L this morning, normal WOB. Will trial duoneb today.  If pt still requires O2/has SOB, consider  CXR -Duoneb today  Hypertension BP this morning of 137/76. Home medications not yet reconciled.    HLD Home medications not yet reconciled.   GERD Protonix 40 mg daily    FEN/GI: carb modified  PPx: heparin Dispo:Pending PT recommendations  tomorrow.   Subjective:  Pt complains of epigastric pain since this morning. Rates it at 7/10.  He denies SOB on 3 L [place]. States he has back pain d/t herniated disks.   Objective: Temp:  [97.7 F (36.5 C)-99.8 F (37.7 C)] 99.3 F (37.4 C) (10/17 0400) Pulse Rate:   [85-103] 89 (10/17 0400) Resp:  [14-28] 17 (10/17 0400) BP: (125-171)/(60-80) 136/73 (10/17 0400) SpO2:  [90 %-100 %] 92 % (10/17 0400) Physical Exam: General: 85 y.o. male lying in bed, uncomfortable d/t positioning and back pain Cardiovascular: RRR Respiratory: CTAB Abdomen: 3 insicion sites. Some serosanguinous leakage from lateral port site  Extremities: No edema  Laboratory: Recent Labs  Lab 09/23/21 2027 09/24/21 0254  WBC 18.0* 20.0*  HGB 15.7 14.8  HCT 48.2 45.9  PLT 201 259   Recent Labs  Lab 09/23/21 2027 09/24/21 0254  NA 140 141  K 3.9 4.2  CL 101 105  CO2 27 25  BUN 17 19  CREATININE 1.36* 1.42*  CALCIUM 9.0 8.5*  PROT 6.8 6.1*  BILITOT 0.9 0.9  ALKPHOS 62 46  ALT 54* 40  AST 46* 32  GLUCOSE 231* 148[name], DO 09/25/2021, 5:20 AM PGY-1, [facility] Family Medicine FPTS Intern pager: [phone], text pages welcome

## 2021-09-25 NOTE — Discharge Instructions (Addendum)
Thank you for letting us care for you during your stay. You were admitted to the [facility] Medicine Teaching Service for inflammation of your gallbladder.  You had your gallbladder surgically removed and completed a course of an antibiotic called Augmentin.  Please follow-up with your surgeon in 3 weeks.  During your stay you required oxygen therapy to keep your oxygen saturation at an appropriate level.  Several tests were run to determine the cause of this oxygen requirement.  While running these tests, you were found to have a possible pneumonia. The Augmentin you took after surgery is helpful to treat some types of bacteria which caused pneumonia.  You were prescribed another antibiotic called doxycycline which treats other types of bacteria that can cause pneumonia.  Please complete this course of antibiotics.  You were found to have a blood clot in your leg, which was treated with blood thinning medication during your stay. After much discussion, it was decided not to continue blood thinning medication after leaving the hospital.  Due to your multiple medical conditions, we placed a referral to the palliative care team who will contact you after you leave the hospital.  Please let us know if you have questions about your stay at [facility].     [facility]  Please arrive at least 30 min before your appointment to complete your check in paperwork.  If you are unable to arrive 30 min prior to your appointment time we may have to cancel or reschedule you. LAPAROSCOPIC SURGERY: POST OP INSTRUCTIONS Always review your discharge instruction sheet given to you by the facility where your surgery was performed. IF YOU HAVE DISABILITY OR FAMILY LEAVE FORMS, YOU MUST BRING THEM TO THE OFFICE FOR PROCESSING.   DO NOT GIVE THEM TO YOUR DOCTOR.  PAIN CONTROL  First take acetaminophen (Tylenol) AND/or ibuprofen (Advil) to control your pain after surgery.  Follow directions on  package.  Taking acetaminophen (Tylenol) and/or ibuprofen (Advil) regularly after surgery will help to control your pain and lower the amount of prescription pain medication you may need.  You should not take more than 4,000 mg (4 grams) of acetaminophen (Tylenol) in 24 hours.  You should not take ibuprofen (Advil), aleve, motrin, naprosyn or other NSAIDS if you have a history of stomach ulcers or chronic kidney disease.  A prescription for pain medication may be given to you upon discharge.  Take your pain medication as prescribed, if you still have uncontrolled pain after taking acetaminophen (Tylenol) or ibuprofen (Advil). Use ice packs to help control pain. If you need a refill on your pain medication, please contact your pharmacy.  They will contact our office to request authorization. Prescriptions will not be filled after 5pm or on week-ends.  HOME MEDICATIONS Take your usually prescribed medications unless otherwise directed.  DIET You should follow a light diet the first few days after arrival home.  Be sure to include lots of fluids daily. Avoid fatty, fried foods.   CONSTIPATION It is common to experience some constipation after surgery and if you are taking pain medication.  Increasing fluid intake and taking a stool softener (such as Colace) will usually help or prevent this problem from occurring.  A mild laxative (Milk of Magnesia or Miralax) should be taken according to package instructions if there are no bowel movements after 48 hours.  WOUND/INCISION CARE Most patients will experience some swelling and bruising in the area of the incisions.  Ice packs will help.  Swelling and bruising can take several days to resolve.  Unless discharge instructions indicate otherwise, follow guidelines below  STERI-STRIPS - you may remove your outer bandages 48 hours after surgery, and you may shower at that time.  You have steri-strips (small skin tapes) in place directly over the incision.   These strips should be left on the skin for 7-10 days.   DERMABOND/SKIN GLUE - you may shower in 24 hours.  The glue will flake off over the next 2-3 weeks. Any sutures or staples will be removed at the office during your follow-up visit.  ACTIVITIES You may resume regular (light) daily activities beginning the next day--such as daily self-care, walking, climbing stairs--gradually increasing activities as tolerated.  You may have sexual intercourse when it is comfortable.  Refrain from any heavy lifting or straining until approved by your doctor. You may drive when you are no longer taking prescription pain medication, you can comfortably wear a seatbelt, and you can safely maneuver your car and apply brakes.  FOLLOW-UP You should see your doctor in the office for a follow-up appointment approximately 2-3 weeks after your surgery.  You should have been given your post-op/follow-up appointment when your surgery was scheduled.  If you did not receive a post-op/follow-up appointment, make sure that you call for this appointment within a day or two after you arrive home to insure a convenient appointment time.   WHEN TO CALL YOUR DOCTOR: Fever over 101.0 Inability to urinate Continued bleeding from incision. Increased pain, redness, or drainage from the incision. Increasing abdominal pain  The clinic staff is available to answer your questions during regular business hours.  Please don't hesitate to call and ask to speak to one of the nurses for clinical concerns.  If you have a medical emergency, go to the nearest emergency room or call 911.  A surgeon from [facility] is always on call at the hospital. [address], [place], [place]  [address] ? [address] [address], [place], [place]   [address] [phone] ? [phone] ? FAX [phone]     Managing Your Pain After Surgery Without Opioids    Thank you for participating in our program to help patients manage  their pain after surgery without opioids. This is part of our effort to provide you with the best care possible, without exposing you or your family to the risk that opioids pose.  What pain can I expect after surgery? You can expect to have some pain after surgery. This is normal. The pain is typically worse the day after surgery, and quickly begins to get better. Many studies have found that many patients are able to manage their pain after surgery with Over-the-Counter (OTC) medications such as Tylenol and Motrin. If you have a condition that does not allow you to take Tylenol or Motrin, notify your surgical team.  How will I manage my pain? The best strategy for controlling your pain after surgery is around the clock pain control with Tylenol (acetaminophen) and Motrin (ibuprofen or Advil). Alternating these medications with each other allows you to maximize your pain control. In addition to Tylenol and Motrin, you can use heating pads or ice packs on your incisions to help reduce your pain.  How will I alternate your regular strength over-the-counter pain medication? You will take a dose of pain medication every three hours. Start by taking 650 mg of Tylenol (2 pills of 325 mg) 3 hours later take 600 mg of Motrin (  3 pills of 200 mg) 3 hours after taking the Motrin take 650 mg of Tylenol 3 hours after that take 600 mg of Motrin.   - 1 -  See example - if your first dose of Tylenol is at 12:00 PM   12:00 PM Tylenol 650 mg (2 pills of 325 mg)  3:00 PM Motrin 600 mg (3 pills of 200 mg)  6:00 PM Tylenol 650 mg (2 pills of 325 mg)  9:00 PM Motrin 600 mg (3 pills of 200 mg)  Continue alternating every 3 hours   We recommend that you follow this schedule around-the-clock for at least 3 days after surgery, or until you feel that it is no longer needed. Use the table on the last page of this handout to keep track of the medications you are taking. Important: Do not take more than 3000mg  of  Tylenol or 3200mg  of Motrin in a 24-hour period. Do not take ibuprofen/Motrin if you have a history of bleeding stomach ulcers, severe kidney disease, &/or actively taking a blood thinner  What if I still have pain? If you have pain that is not controlled with the over-the-counter pain medications (Tylenol and Motrin or Advil) you might have what we call "breakthrough" pain. You will receive a prescription for a small amount of an opioid pain medication such as Oxycodone, Tramadol, or Tylenol with Codeine. Use these opioid pills in the first 24 hours after surgery if you have breakthrough pain. Do not take more than 1 pill every 4-6 hours.  If you still have uncontrolled pain after using all opioid pills, don't hesitate to call our staff using the number provided. We will help make sure you are managing your pain in the best way possible, and if necessary, we can provide a prescription for additional pain medication.   Day 1    Time  Name of Medication Number of pills taken  Amount of Acetaminophen  Pain Level   Comments  AM PM       AM PM       AM PM       AM PM       AM PM       AM PM       AM PM       AM PM       Total Daily amount of Acetaminophen Do not take more than  3,000 mg per day      Day 2    Time  Name of Medication Number of pills taken  Amount of Acetaminophen  Pain Level   Comments  AM PM       AM PM       AM PM       AM PM       AM PM       AM PM       AM PM       AM PM       Total Daily amount of Acetaminophen Do not take more than  3,000 mg per day      Day 3    Time  Name of Medication Number of pills taken  Amount of Acetaminophen  Pain Level   Comments  AM PM       AM PM       AM PM       AM PM          AM PM       AM  PM       AM PM       AM PM       Total Daily amount of Acetaminophen Do not take more than  3,000 mg per day      Day 4    Time  Name of Medication Number of pills taken  Amount of Acetaminophen  Pain  Level   Comments  AM PM       AM PM       AM PM       AM PM       AM PM       AM PM       AM PM       AM PM       Total Daily amount of Acetaminophen Do not take more than  3,000 mg per day      Day 5    Time  Name of Medication Number of pills taken  Amount of Acetaminophen  Pain Level   Comments  AM PM       AM PM       AM PM       AM PM       AM PM       AM PM       AM PM       AM PM       Total Daily amount of Acetaminophen Do not take more than  3,000 mg per day       Day 6    Time  Name of Medication Number of pills taken  Amount of Acetaminophen  Pain Level  Comments  AM PM       AM PM       AM PM       AM PM       AM PM       AM PM       AM PM       AM PM       Total Daily amount of Acetaminophen Do not take more than  3,000 mg per day      Day 7    Time  Name of Medication Number of pills taken  Amount of Acetaminophen  Pain Level   Comments  AM PM       AM PM       AM PM       AM PM       AM PM       AM PM       AM PM       AM PM       Total Daily amount of Acetaminophen Do not take more than  3,000 mg per day        For additional information about how and where to safely dispose of unused opioid medications - [email or web address]  Disclaimer: This document contains information and/or instructional materials adapted from [facility] for the typical patient with your condition. It does not replace medical advice from your health care provider because your experience may differ from that of the typical patient. Talk to your health care provider if you have any questions about this document, your condition or your treatment plan. Adapted from [facility]

## 2021-09-25 NOTE — Evaluation (Signed)
Occupational Therapy Evaluation Patient Details Name: Joshua [name] MRN: [id number] DOB: Mar 04, 1936 Today's Date: 09/25/2021   History of Present Illness 85 y.o. male presenting with abdominal pain likely secondary to cholecystitis. 09/24/21 Laparoscopic cholecystectomy  PMH is significant for T2DM, COPD, HLD, HTN   Clinical Impression   Pt. Is being seen for OT evaluation to assess for needs. Pt. O2 sats dropped to 85 to 86 with activity. Pt. Nurse states they want his o2 between 88 and 92. Pt. Has decreased I and safety with ADLs and mobility. Pt. Would benefit from further OT to increase ability to care for himself. Acute OT to follow.     Recommendations for follow up therapy are one component of a multi-disciplinary discharge planning process, led by the attending physician.  Recommendations may be updated based on patient status, additional functional criteria and insurance authorization.   Follow Up Recommendations  Home health OT    Equipment Recommendations  None recommended by OT    Recommendations for Other Services       Precautions / Restrictions Precautions Precautions: Fall Precaution Comments: Pt. reports 2 falls in the past 6 months. Restrictions Weight Bearing Restrictions: No      Mobility Bed Mobility Overal bed mobility: Needs Assistance Bed Mobility: Rolling;Sidelying to Sit Rolling: Supervision Sidelying to sit: Min assist       General bed mobility comments: vc for rolling technique with rail; assist to raise torso with cues for sequence; cues for scoot out to EOB    Transfers Overall transfer level: Needs assistance Equipment used: Rolling walker (2 wheeled) Transfers: Sit to/from [employer] Sit to Stand: Min assist Stand pivot transfers: Min assist       General transfer comment: cues for proer hand placement.    Balance Overall balance assessment: Mild deficits observed, not formally tested Sitting-balance support:  No upper extremity supported;Feet supported Sitting balance-Leahy Scale: Good     Standing balance support: Bilateral upper extremity supported Standing balance-Leahy Scale: Poor                             ADL either performed or assessed with clinical judgement   ADL Overall ADL's : Needs assistance/impaired Eating/Feeding: Independent   Grooming: Wash/dry hands;Wash/dry face;Sitting;Set up   Upper Body Bathing: Minimal assistance;Sitting   Lower Body Bathing: Maximal assistance;Sit to/from stand   Upper Body Dressing : Minimal assistance;Sitting   Lower Body Dressing: Total assistance;Sit to/from stand   Toilet Transfer: Minimal assistance   Toileting- Clothing Manipulation and Hygiene: Minimal assistance       Functional mobility during ADLs: Minimal assistance;Rolling walker General ADL Comments: Pt. needs further ed on use of AE for LE ADLs.     Vision Baseline Vision/History: 1 Wears glasses Ability to See in Adequate Light: 0 Adequate Patient Visual Report: No change from baseline Vision Assessment?: No apparent visual deficits     Perception     Praxis      Pertinent Vitals/Pain Pain Assessment: 0-10 Pain Score: 5  Faces Pain Scale: Hurts even more Pain Location: abd Pain Descriptors / Indicators: Aching Pain Intervention(s): Limited activity within patient's tolerance;RN gave pain meds during session     Hand Dominance Right   Extremity/Trunk Assessment Upper Extremity Assessment Upper Extremity Assessment: Generalized weakness   Lower Extremity Assessment Lower Extremity Assessment: Defer to PT evaluation   Cervical / Trunk Assessment Cervical / Trunk Assessment: Kyphotic   Communication Communication Communication: [facility]  Cognition Arousal/Alertness: Awake/alert Behavior During Therapy: WFL for tasks assessed/performed Overall Cognitive Status: Within Functional Limits for tasks assessed                                      General Comments  O2 sats dropping into 80s. pt. respiratory therapist came and put nebulizer tx on pt. and he went back into low 90s.Per RN his range is between 88 and 92.    Exercises     Shoulder Instructions      Home Living Family/patient expects to be discharged to:: Private residence Living Arrangements: Spouse/significant other Available Help at Discharge: Family;Available 24 hours/day Type of Home: House Home Access: Stairs to enter [employer] of Steps: 3 Entrance Stairs-Rails: Right Home Layout: One level     Bathroom Shower/Tub: Chief Strategy Officer: Handicapped height     Home Equipment: Environmental consultant - 2 wheels;Shower seat          Prior Functioning/Environment Level of Independence: Needs assistance  Gait / Transfers Assistance Needed: inside uses no device usually; outside either use RW or pushes a wheelbarrow (with a folded chair in it in case he needs to sit down because of his back pain) ADL's / Homemaking Assistance Needed: mod i per pt.            OT Problem List: Decreased activity tolerance;Decreased knowledge of use of DME or AE;Pain      OT Treatment/Interventions: Self-care/ADL training;DME and/or AE instruction;Therapeutic activities;Patient/family education    OT Goals(Current goals can be found in the care plan section) Acute Rehab OT Goals Patient Stated Goal: to get stronger and go home. OT Goal Formulation: With patient Time For Goal Achievement: 10/09/21 Potential to Achieve Goals: Good ADL Goals Pt Will Perform Grooming: with modified independence;standing Pt Will Perform Upper Body Bathing: with modified independence;sitting Pt Will Perform Lower Body Bathing: with modified independence;with adaptive equipment Pt Will Perform Upper Body Dressing: with modified independence;sitting Pt Will Perform Lower Body Dressing: with modified independence;with adaptive equipment Pt Will Transfer to Toilet:  with modified independence;ambulating Pt Will Perform Toileting - Clothing Manipulation and hygiene: with modified independence;sit to/from stand;with adaptive equipment  OT Frequency: Min 2X/week   Barriers to D/C:  (Pt. states his wife will be able to assist at home.)          Co-evaluation              AM-PAC OT "6 Clicks" Daily Activity     Outcome Measure Help from another person eating meals?: None Help from another person taking care of personal grooming?: A Little Help from another person toileting, which includes using toliet, bedpan, or urinal?: A Little Help from another person bathing (including washing, rinsing, drying)?: A Lot Help from another person to put on and taking off regular upper body clothing?: A Little Help from another person to put on and taking off regular lower body clothing?: Total 6 Click Score: 16   End of Session Equipment Utilized During Treatment: Engineer, water Communication:  (nurse informed of decreased o2 sats)  Activity Tolerance: Patient limited by pain Patient left: in chair;with call bell/phone within reach;with chair alarm set  OT Visit Diagnosis: Unsteadiness on feet (R26.81);Muscle weakness (generalized) (M62.81);History of falling (Z91.81)                Time: 4650-3546 OT Time Calculation (min): 39 min Charges:  OT  General Charges $OT Visit: 1 Visit OT Evaluation $OT Eval Moderate Complexity: 1 Mod OT Treatments $Self Care/Home Management : 8-22 mins  Joshua [name] OT/L   Joshua [name] 09/25/2021, 12:35 PM

## 2021-09-26 DIAGNOSIS — R1010 Upper abdominal pain, unspecified: Secondary | ICD-10-CM | POA: Diagnosis not present

## 2021-09-26 LAB — BASIC METABOLIC PANEL
Anion gap: 9 (ref 5–15)
BUN: 30 mg/dL — ABNORMAL HIGH (ref 8–23)
CO2: 25 mmol/L (ref 22–32)
Calcium: 8.3 mg/dL — ABNORMAL LOW (ref 8.9–10.3)
Chloride: 100 mmol/L (ref 98–111)
Creatinine, Ser: 1.68 mg/dL — ABNORMAL HIGH (ref 0.61–1.24)
GFR, Estimated: 40 mL/min — ABNORMAL LOW (ref >60)
Glucose, Bld: 104 mg/dL — ABNORMAL HIGH (ref 70–99)
Potassium: 4 mmol/L (ref 3.5–5.1)
Sodium: 134 mmol/L — ABNORMAL LOW (ref 135–145)

## 2021-09-26 LAB — CBC WITH DIFFERENTIAL/PLATELET
Abs Immature Granulocytes: 0 10*3/uL (ref 0.00–0.07)
Basophils Absolute: 0 10*3/uL (ref 0.0–0.1)
Basophils Relative: 0 %
Eosinophils Absolute: 0.6 10*3/uL — ABNORMAL HIGH (ref 0.0–0.5)
Eosinophils Relative: 3 %
HCT: 39.5 % (ref 39.0–52.0)
Hemoglobin: 13 g/dL (ref 13.0–17.0)
Lymphocytes Relative: 4 %
Lymphs Abs: 0.8 10*3/uL (ref 0.7–4.0)
MCH: 31.3 pg (ref 26.0–34.0)
MCHC: 32.9 g/dL (ref 30.0–36.0)
MCV: 95.2 fL (ref 80.0–100.0)
Monocytes Absolute: 2.1 10*3/uL — ABNORMAL HIGH (ref 0.1–1.0)
Monocytes Relative: 10 %
Neutro Abs: 17.1 10*3/uL — ABNORMAL HIGH (ref 1.7–7.7)
Neutrophils Relative %: 83 %
Platelets: UNDETERMINED 10*3/uL (ref 150–400)
RBC: 4.15 MIL/uL — ABNORMAL LOW (ref 4.22–5.81)
RDW: 13.7 % (ref 11.5–15.5)
WBC: 20.6 10*3/uL — ABNORMAL HIGH (ref 4.0–10.5)
nRBC: 0 % (ref 0.0–0.2)
nRBC: 0 /100 WBC

## 2021-09-26 LAB — GLUCOSE, CAPILLARY
Glucose-Capillary: 102 mg/dL — ABNORMAL HIGH (ref 70–99)
Glucose-Capillary: 158 mg/dL — ABNORMAL HIGH (ref 70–99)
Glucose-Capillary: 146 mg/dL — ABNORMAL HIGH (ref 70–99)
Glucose-Capillary: 115 mg/dL — ABNORMAL HIGH (ref 70–99)

## 2021-09-26 LAB — SURGICAL PATHOLOGY

## 2021-09-26 MED ORDER — TRAMADOL HCL 50 MG PO TABS
50.0000 mg | ORAL_TABLET | Freq: Once | ORAL | Status: AC
  Administered 2021-09-26: 50 mg via ORAL
  Filled 2021-09-26: qty 1

## 2021-09-26 MED ORDER — DICLOFENAC SODIUM 1 % EX GEL
2.0000 g | Freq: Four times a day (QID) | CUTANEOUS | Status: DC
  Administered 2021-09-26 – 2021-09-29 (×9): 2 g via TOPICAL
  Filled 2021-09-26: qty 100

## 2021-09-26 MED ORDER — EMPAGLIFLOZIN 10 MG PO TABS
10.0000 mg | ORAL_TABLET | Freq: Every day | ORAL | Status: DC
  Administered 2021-09-26 – 2021-09-27 (×2): 10 mg via ORAL
  Filled 2021-09-26 (×3): qty 1

## 2021-09-26 MED ORDER — IPRATROPIUM-ALBUTEROL 0.5-2.5 (3) MG/3ML IN SOLN
3.0000 mL | Freq: Two times a day (BID) | RESPIRATORY_TRACT | Status: DC
  Administered 2021-09-26: 3 mL via RESPIRATORY_TRACT
  Filled 2021-09-26: qty 3

## 2021-09-26 MED ORDER — IPRATROPIUM-ALBUTEROL 0.5-2.5 (3) MG/3ML IN SOLN
3.0000 mL | Freq: Two times a day (BID) | RESPIRATORY_TRACT | Status: DC
  Administered 2021-09-26 – 2021-09-27 (×2): 3 mL via RESPIRATORY_TRACT
  Filled 2021-09-26 (×3): qty 3

## 2021-09-26 MED ORDER — MELATONIN 5 MG PO TABS
5.0000 mg | ORAL_TABLET | Freq: Every day | ORAL | Status: DC
  Administered 2021-09-26 – 2021-09-28 (×3): 5 mg via ORAL
  Filled 2021-09-26 (×3): qty 1

## 2021-09-26 NOTE — Hospital Course (Addendum)
Joshua [name] is an 85 year old male with PMH of type 2 diabetes, COPD, HLD, HTN admitted for cholecystitis and is s/p cholecystectomy  Acute cholecystitis s/p cholecystectomy After 2 days of abdominal pain, abdominal CT showed distention and edema of gallbladder suggestive of cholecystitis.  Antibiotics were initiated and patient had cholecystectomy on 09/24/21 and was found to have gangrenous cholecystitis.  Patient was transitioned from IV Zosyn to PO Augmentin 875-125 mg 1 tab BID.  Patient should follow-up with surgery outpatient.  PT recommends patient to have  home health upon discharge.  Acute vs chronic respiratory failure  Patient had oxygen requirement upon arrival to ED, has not used oxygen at home previously.  Although his wife states she believes he should use oxygen at home.  Patient does have COPD.  Had chest x-ray on 09/25/21 which showed cardiomegaly with small bilateral pleural pleural effusions and the left basilar atelectasis/airspace disease. Patient received DuoNebs BID.  Was trialed on 20 mg IV Lasix which did not seem to impact his respiratory status. An echo performed on 09/27/21 showed EF of 65-70% and also showed findings concerning for thrombus versus vegetation in right ventricle. TEE on 09/28/21 showed no evidence of vegetation or thrombus.  CTA chest showed no PE, small bilateral pleural effusions, airspace opacities in lower lobes and right middle lobe which could reflect atelectasis or pneumonia.  Patient was treated with Augmentin for a total of 6 days and was started on doxycycline 100 mg daily for total 5 days.  As patient was hypoxic upon arrival to ED, suspected pneumonia is not hospital-acquired.   Chronic DVT Patient was found to have chronic DVT involving left popliteal vein and was started on heparin drip. Patient has history of frequent falls so family and pt weighed pros and cons of discharging pt on anticoagulation. Family and patient elected NOT to pursue  anticoagulation at discharge. Cardiology recommended out patient follow up. Pt and family would like referral to palliative care.   AKI Patient had creatinine of 1.42 on admission which increased to 1.96 two days after receiving contrast for CT scan.  Upon discharge creatinine improved to 1.46.  Chronic low back pain Patient experienced severe lower back pain throughout his stay and was treated with tramadol and gabapentin.  AAA Patient has AAA 3 cm in size seen on CT which will need repeat imaging every 3 years  Home medications for hypertension and hyperlipidemia were not given during his stay.  Blood pressure remained stable.  Issues for follow-up 1. F/u with surgery 2. Follow-up on oxygen requirement and respiratory status 3. Consider home medications to manage COPD 4.  Follow-up AAA 5. Ensure follow up with cardiology out patient  6. Ensure follow up with palliative care

## 2021-09-26 NOTE — Progress Notes (Signed)
Family Medicine Teaching Service Daily Progress Note Intern Pager: [phone]  Patient name: Joshua [name] Medical record number: [id number] Date of birth: December 24, 1935 Age: 85 y.o. Gender: male  Primary Care Provider: Kaleen Mask, MD Consultants: General surgery Code Status: Full  Pt Overview and Major Events to Date:  09/24/2021-admitted 09/24/2021-cholecystectomy  Assessment and Plan: Joshua [name] is an 85 year old male with past medical history of type 2 diabetes, COPD, HLD, hypertension admitted for cholecystitis and is s/p cholecystectomy  Acute gangrenous cholecystitis s/p cholecystectomy Patient is on POD #2. WBC 23.9> 20.6.  From surgical standpoint patient is okay for discharge.  He was started on PO Augmentin yesterday.  PT recommends home health at discharge. Pt had some nausea and confusion overnight, did not vomit. Was given one dose of zofran which helped. No nausea this morning.  -D/C Tramadol  -Tylenol 1000 mg every 6 hours -PO Augmentin 875-125 mg 1 tab BID with end date of 09/29/21 -HH at discharge  -PT eval and treat  Acute respiratory failure Patient has new oxygen requirement as of 09/24/21, does not require oxygen at home.  Although his wife states she believes he should use oxygen at home.  Patient does have COPD.  Had chest x-ray yesterday which showed cardiomegaly with small bilateral pleural pleural effusions and the left basilar atelectasis/airspace disease.  Is currently requiring  3 L via [place] with oxygen saturations in the low 90's and normal WOB. Pt states he does not feel SOB. Overnight patient was noted to have bibasilar Rales, IVF was discontinued and patient was given 20 mg IV Lasix. 24 UOP of 1,149mL, Cr of 1.68 this a.m. Per daughter, Last known echocardiogram was at least 5 years ago and daughter states that she believes he had normal cardiac function at that time.  Will consider congestive heart failure as an explanation for acute respiratory  failure however, known diagnosis of COPD could be the explanation for his oxygen requirement. -echo today -Strict I/Os -Keep O2 saturation 88-92% -Incentive spirometry -douneb BID  AKI Creatinine 1.96> 1.68 this morning.  GFR 33> 40.  AKI is improving 3 days after having contrast for CT scan. -A.m. BMP  Type 2 diabetes Glucose today is 102.  Hemoglobin A1c of 7.2 yesterday. -D/c SSI -Start Jardiance   Hypertension BP this morning at 121/75.  Home medications not yet reconciled   HLD Home medications not yet reconciled  GERD Protonix 40 mg daily  FEN/GI: Carb modified PPx: Heparin Dispo:Home with [facility] tomorrow. Barriers include further work-up for oxygen requirement.   Subjective:  Patient states he did not sleep well last night due to feeling nauseous.  States that the dose of Zofran helped.  He denies any vomiting.  States he has been able to eat a little bit.  Has not had a bowel movement since he has been in the hospital.  Patient states he is feeling less short of breath here with 3 L of oxygen via [place] than he does at home with no oxygen.  I trialed him on room air during the exam, patient desatted into the low 80s.  Objective: Temp:  [97.9 F (36.6 C)-98.9 F (37.2 C)] 98.9 F (37.2 C) (10/18 0251) Pulse Rate:  [79-100] 100 (10/18 0251) Resp:  [17-19] 19 (10/18 0251) BP: (137-150)/(58-87) 150/83 (10/18 0251) SpO2:  [90 %-96 %] 90 % (10/18 0251) Physical Exam: General: Patient lying in bed, in and out of sleep, NAD Cardiovascular: RRR Respiratory: CTAB, normal work of breathing on 3 L  of oxygen via [place] with saturations in the low 90s. Abdomen: Soft, normal bowel sounds, most lateral incision covered with gauze with a small amount of serosanguineous fluid leakage, other incisions not leaking.  All appear to be healing well. Extremities: No edema of BLEs  Laboratory: Recent Labs  Lab 09/24/21 0254 09/25/21 0928 09/26/21 0142  WBC 20.0* 23.9* 20.6*  HGB 14.8 13.5  13.0  HCT 45.9 42.0 39.5  PLT 259 163 PLATELET CLUMPS NOTED ON SMEAR, UNABLE TO ESTIMATE   Recent Labs  Lab 09/23/21 2027 09/24/21 0254 09/25/21 0928 09/25/21 2223 09/26/21 0142  NA 140 141 137 135 134*  K 3.9 4.2 4.1 3.9 4.0  CL 101 105 100 101 100  CO2 27 25 25 26 25   BUN 17 19 31* 30* 30*  CREATININE 1.36* 1.42* 1.96* 1.79* 1.68*  CALCIUM 9.0 8.5* 8.4* 8.4* 8.3*  PROT 6.8 6.1* 5.7*  --   --   BILITOT 0.9 0.9 1.1  --   --   ALKPHOS 62 46 35*  --   --   ALT 54* 40 39  --   --   AST 46* 32 42*  --   --   GLUCOSE 231* 148* 129* 113* 104 , DO 09/26/2021, 5:17 AM PGY-1, [facility] Family Medicine FPTS Intern pager: [phone], text pages welcome

## 2021-09-26 NOTE — Progress Notes (Addendum)
Family Medicine Teaching Service Daily Progress Note Intern Pager: [phone]  Patient name: Joshua [name] Medical record number: [id number] Date of birth: May 20, 1936 Age: 85 y.o. Gender: male  Primary Care Provider: Kaleen Mask, MD Consultants: General surgery Code Status: Full  Pt Overview and Major Events to Date:  09/23/2021-admitted 09/24/2021-cholecystectomy  Assessment and Plan: Hosey Burmester. Ketchum is an 85 year old male with past medical history of type 2 diabetes, COPD, HLD, hypertension admitted for cholecystitis and is s/p cholecystectomy    Acute gangrenous cholecystitis s/p cholecystectomy POD #3.  -Tylenol 1000 mg every 6 hours -PO Augmentin  875-125 mg 1 tab BID with end date of 09/29/21 -Home health at discharge  -PT eval and treat  Acute vs chronic respiratory failure  known COPD Patient requiring 2.5 liters of oxygen today with saturation in the low 90's.  Patient has not previously had home oxygen requirement.  Patient does have COPD but does not currently take any medications at home.  Chest x-ray from 09/25/2021 showed cardiomegaly with small bilateral pleural effusions and left basilar atelectasis/airspace disease.  Patient has been receiving duo nebs twice daily since yesterday.  He states he does not think they make a difference and patient's family state they do not believe he will use inhalers at home.  Patient has not had an echo done in the several years and last EF is unknown.  He received 20 mg IV Lasix on 09/25/2021.  24 UOP of 1450 mL.  Patient does not appear to be fluid overloaded on exam.  Creatinine of 1.46 today, down from 1.68 yesterday. -Echo today -Strict I's/O's -Keep O2 saturation 88-92% -Incentive spirometry -DuoNeb twice daily -can d/c today and f/u out patient with primary care regarding echo and COPD management   Chronic low back pain Overnight patient complained of severe lower back pain.  He was given 1 dose of tramadol 50 mg  at 2100, Voltaren gel was also applied and patient was moved to sit up in his chair.  He was agitated and continued to complain of back pain around 5 AM, was given 1 dose of gabapentin 100 mg at 0425 which he takes at home TID.  Patient states his pain was 10/10 last night and rates it at 4/10 this morning.  Type 2 diabetes Glucose today is 94.  Hemoglobin A1c of 7.2 on 09/25/2021.  SSI was discontinued yesterday and patient was started on Jardiance. -Jardiance 10 mg daily   Hypokalemia Potassium is 3.3 this morning. -Potassium chloride 40 mEq for 2 doses every 4 hours  AKI Resolving. Creatinine of 1.46 today, down from 1.68 yesterday and 1.96 on 09/26/2019.  HTN BP this am is 140/63  GERD Protonix 40 mg daily   FEN/GI: carb modified PPx: heparin Dispo:Home with home health  today Subjective:  Patient rates his back pain at 4/10 this morning which is improved from 10/10 last night.  Family states patient is not eating well.  He states he does not have much of an appetite.  He is drinking well.  Family is concerned about him not eating.  Objective: Temp:  [97.9 F (36.6 C)-98.9 F (37.2 C)] 98.4 F (36.9 C) (10/18 1153) Pulse Rate:  [79-104] 104 (10/18 1153) Resp:  [17-20] 19 (10/18 1153) BP: (121-150)/(58-83) 134/73 (10/18 1153) SpO2:  [83 %-96 %] 83 % (10/18 1155) FiO2 (%):  [32 %] 32 % (10/18 1104) Physical Exam: General: Patient lying in bed, NAD, receiving DuoNeb treatment Cardiovascular: RRR Respiratory: CTAB, normal work of breathing Abdomen:  Soft, nondistended, nontender to palpation, port sites from cholecystectomy are healing well, bandage on the most lateral side due to fluid leakage Extremities: No edema of BLEs  Laboratory: Recent Labs  Lab 09/24/21 0254 09/25/21 0928 09/26/21 0142  WBC 20.0* 23.9* 20.6*  HGB 14.8 13.5 13.0  HCT 45.9 42.0 39.5  PLT 259 163 PLATELET CLUMPS NOTED ON SMEAR, UNABLE TO ESTIMATE   Recent Labs  Lab 09/23/21 2027  09/24/21 0254 09/25/21 0928 09/25/21 2223 09/26/21 0142  NA 140 141 137 135 134*  K 3.9 4.2 4.1 3.9 4.0  CL 101 105 100 101 100  CO2 27 25 25 26 25   BUN 17 19 31* 30* 30*  CREATININE 1.36* 1.42* 1.96* 1.79* 1.68*  CALCIUM 9.0 8.5* 8.4* 8.4* 8.3*  PROT 6.8 6.1* 5.7*  --   --   BILITOT 0.9 0.9 1.1  --   --   ALKPHOS 62 46 35*  --   --   ALT 54* 40 39  --   --   AST 46* 32 42*  --   --   GLUCOSE 231* 148* 129* 113* 104 , DO 09/26/2021, 3:24 PM PGY-1, [facility] Family Medicine FPTS Intern pager: [phone], text pages welcome

## 2021-09-26 NOTE — Progress Notes (Signed)
SATURATION QUALIFICATIONS: (This note is used to comply with regulatory documentation for home oxygen)  Patient Saturations on Room Air at Rest = 85%  Patient Saturations on Room Air while Ambulating = 80%  Patient Saturations on 3 Liters of oxygen while Ambulating = 93%  Please briefly explain why patient needs home oxygen: pts oxygen  drops bellow 88% on room air

## 2021-09-26 NOTE — Progress Notes (Signed)
2 Days Post-Op  Subjective: Sleeping today.  Daughter provides history.  States he had a bad night and had some delirium, pulling at some things, fidgety, desat, and required some lasix.  Still with back pain, and minimal abdominal pain.  Ate pancake, bacon, eggs, and coffee.   ROS: Sleepy  Objective: Vital signs in last 24 hours: Temp:  [97.9 F (36.6 C)-98.9 F (37.2 C)] 98 F (36.7 C) (10/18 0732) Pulse Rate:  [79-100] 100 (10/18 0732) Resp:  [17-20] 20 (10/18 0732) BP: (121-150)/(58-87) 121/75 (10/18 0732) SpO2:  [90 %-96 %] 92 % (10/18 0732)    Intake/Output from previous day: 10/17 0701 - 10/18 0700 In: 1105.3 [I.V.:955.3; IV Piggyback:150] Out: 1150 [Urine:1150] Intake/Output this shift: Total I/O In: 240 [P.O.:240] Out: 550 [Urine:550]  PE: Abd: soft, minimally tender, +BS, ND, incisions c/d/I, except most lateral covered with gauze with small amount of serous leakage.  Lab Results:  Recent Labs    09/25/21 0928 09/26/21 0142  WBC 23.9* 20.6*  HGB 13.5 13.0  HCT 42.0 39.5  PLT 163 PLATELET CLUMPS NOTED ON SMEAR, UNABLE TO ESTIMATE   BMET Recent Labs    09/25/21 2223 09/26/21 0142  NA 135 134*  K 3.9 4.0  CL 101 100  CO2 26 25  GLUCOSE 113* 104*  BUN 30* 30*  CREATININE 1.79* 1.68*  CALCIUM 8.4* 8.3*   PT/INR Recent Labs    09/23/21 2027  LABPROT 12.1  INR 0.9   CMP     Component Value Date/Time   NA 134 (L) 09/26/2021 0142   K 4.0 09/26/2021 0142   CL 100 09/26/2021 0142   CO2 25 09/26/2021 0142   GLUCOSE 104 (H) 09/26/2021 0142   BUN 30 (H) 09/26/2021 0142   CREATININE 1.68 (H) 09/26/2021 0142   CALCIUM 8.3 (L) 09/26/2021 0142   PROT 5.7 (L) 09/25/2021 0928   ALBUMIN 2.4 (L) 09/25/2021 0928   AST 42 (H) 09/25/2021 0928   ALT 39 09/25/2021 0928   ALKPHOS 35 (L) 09/25/2021 0928   BILITOT 1.1 09/25/2021 0928   GFRNONAA 40 (L) 09/26/2021 0142   GFRAA 44 (L) 07/02/2020 0816   Lipase     Component Value Date/Time   LIPASE  415 (H) 09/24/2021 0254       Studies/Results: DG Chest 2 View  Result Date: 09/25/2021 CLINICAL DATA:  Shortness of breath. EXAM: CHEST - 2 VIEW COMPARISON:  Chest x-ray 09/23/2021. FINDINGS: Lung volumes are low. There are patchy airspace opacities in the left lower lung. There is atelectasis in the right lung base. Small pleural effusions are present. The heart is enlarged. There is no pneumothorax or acute fracture. IMPRESSION: 1. Cardiomegaly with small bilateral pleural effusions and left basilar atelectasis/airspace disease. T Electronically Signed   By: Darliss Cheney M.D.   On: 09/25/2021 20:28    Anti-infectives: Anti-infectives (From admission, onward)    Start     Dose/Rate Route Frequency Ordered Stop   09/25/21 1400  amoxicillin-clavulanate (AUGMENTIN) 875-125 MG per tablet 1 tablet        1 tablet Oral Every 12 hours 09/25/21 1125 09/29/21 0959   09/24/21 0800  piperacillin-tazobactam (ZOSYN) IVPB 3.375 g  Status:  Discontinued        3.375 g 12.5 mL/hr over 240 Minutes Intravenous Every 8 hours 09/24/21 0144 09/25/21 1125   09/24/21 0030  piperacillin-tazobactam (ZOSYN) IVPB 3.375 g        3.375 g 100 mL/hr over 30 Minutes Intravenous  Once  09/24/21 0027 09/24/21 0228        Assessment/Plan POD 2, s/p lap chole for gangrenous cholecystitis by Dr. Freida Busman 10/16 -needs to cont to mobilize.  Therapies recommend HH PT/OT -carb mod diet -pain controlled well with Tylenol.  Tramadol prn -WBC down to 20K today, continue abx for 5 days total -surgically stable -follow up with our office being arranged -tramadol sent to pharmacy   FEN - carb mod VTE - heparin TID ID - zosyn, can treat for a total of 5 days post op.  Can switch to augmentin to complete regimen at discharge  AKI DM COPD HTN --other medical problems per medicine--   LOS: 2 days    Letha Cape , [facility] Surgery 09/26/2021, 8:38 AM Please see Amion for pager number during day  hours 7:00am-4:30pm or 7:00am -11:30am on weekends

## 2021-09-26 NOTE — TOC Initial Note (Signed)
Transition of Care [facility]) - Initial/Assessment Note    Patient Details  Name: Joshua [name] MRN: [id number] Date of Birth: 1936-06-01  Transition of Care [facility]) CM/SW Contact:    Harriet Masson, RN Phone Number: 09/26/2021, 2:54 PM  Clinical Narrative:    Orders for Home Health PT/OT. Spoke to patient's daughter , Jens Som, regarding transition needs.Choice offered with list provided per CMS guidelines from medicare.gov website with star ratings (copy placed in shadow chart).    1st choice [facility]. Spoke to [place] and referral not accepted due to staffing.  2nd choice [facility].Spoke to Amy and referral accepted.   Address, Phone and PCP verified.   Patient has transportation home once discharged.    Expected Discharge Plan: Home w Home Health Services Barriers to Discharge: Continued Medical Work up   Patient Goals and CMS Choice Patient states their goals for this hospitalization and ongoing recovery are:: return home CMS Medicare.gov Compare Post Acute Care list provided to:: Patient Represenative (must comment) (daughter Angela Adam) Choice offered to / list presented to : Patient, Adult Children  Expected Discharge Plan and Services Expected Discharge Plan: Home w Home Health Services     Post Acute Care Choice: Home Health Living arrangements for the past 2 months: Single Family Home                           HH Arranged: PT, OT [facility] Agency: [facility] Date [facility] Agency Contacted: 09/26/21 Time HH Agency Contacted: [id number] Representative spoke with at [facility] Agency: Amy  Prior Living Arrangements/Services Living arrangements for the past 2 months: Single Family Home Lives with:: Spouse Patient language and need for interpreter reviewed:: Yes Do you feel safe going back to the place where you live?: Yes      Need for Family Participation in Patient Care: Yes (Comment) Care giver support system in place?: Yes (comment)   Criminal Activity/Legal  Involvement Pertinent to Current Situation/Hospitalization: No - Comment as needed  Activities of Daily Living      Permission Sought/Granted Permission sought to share information with : Facility Industrial/product designer granted to share information with : Yes, Verbal Permission Granted     Permission granted to share info w AGENCY: home health        Emotional Assessment Appearance:: Appears older than stated age Attitude/Demeanor/Rapport: Engaged Affect (typically observed): Accepting Orientation: : Oriented to Self, Oriented to Place, Oriented to  Time Alcohol / Substance Use: Not Applicable Psych Involvement: No (comment)  Admission diagnosis:  Cholecystitis [K81.9] RUQ pain [R10.11] Upper abdominal pain [R10.10] Sepsis (HCC) [A41.9] Patient Active Problem List   Diagnosis Date Noted   Cholecystitis 09/24/2021   Upper abdominal pain    Gastritis 07/01/2020   Abnormal CXR 08/01/2018   COPD (chronic obstructive pulmonary disease) (HCC) 08/01/2018   OA (osteoarthritis) of knee 11/11/2017   Myelopathy (HCC) 07/11/2017   Essential hypertension 04/30/2017   Diabetes mellitus type 2 in nonobese (HCC) 04/30/2017   Hyperlipidemia 04/30/2017   Right shoulder pain 04/30/2017   PCP:  Kaleen Mask, MD Pharmacy:   [facility] - [place] - [address] [address] [place] [address] Phone: [phone] Fax: [phone]     Social Determinants of Health (SDOH) Interventions    Readmission Risk Interventions No flowsheet data found.

## 2021-09-26 NOTE — Progress Notes (Addendum)
I was contacted by patient's RN about patient having significant lower back pain. I saw patient with daughter at bedside who informed me patient was having significant lower back pain that started sometime around 3pm this evening. Daughter reports that he has a history of slipped disc. Patient reports pain to be sharp and throbbing. It improved when he supports his back with a pillow. He denies having any tingling or numbness in his LE. Pain is unchanged with laying flat on his back. On exam pt appears distressed and  has 5/5 muscle strength on LE with intact sensation, +2 pedal pulse bilaterally.   Per daughter he has hx of falls on oxycodone. Tramadol has improved his pain in the past. Will order one time dose of 50mg   tramadol.

## 2021-09-27 ENCOUNTER — Inpatient Hospital Stay (HOSPITAL_COMMUNITY): Payer: Medicare Other

## 2021-09-27 ENCOUNTER — Other Ambulatory Visit (HOSPITAL_COMMUNITY): Payer: Medicare Other

## 2021-09-27 ENCOUNTER — Other Ambulatory Visit (HOSPITAL_COMMUNITY): Payer: Self-pay

## 2021-09-27 DIAGNOSIS — R0602 Shortness of breath: Secondary | ICD-10-CM

## 2021-09-27 DIAGNOSIS — R9431 Abnormal electrocardiogram [ECG] [EKG]: Secondary | ICD-10-CM

## 2021-09-27 DIAGNOSIS — Z87891 Personal history of nicotine dependence: Secondary | ICD-10-CM

## 2021-09-27 DIAGNOSIS — R0609 Other forms of dyspnea: Secondary | ICD-10-CM | POA: Diagnosis not present

## 2021-09-27 DIAGNOSIS — R931 Abnormal findings on diagnostic imaging of heart and coronary circulation: Secondary | ICD-10-CM

## 2021-09-27 DIAGNOSIS — R000 Tachycardia, unspecified: Secondary | ICD-10-CM

## 2021-09-27 DIAGNOSIS — R1011 Right upper quadrant pain: Secondary | ICD-10-CM | POA: Diagnosis not present

## 2021-09-27 DIAGNOSIS — I10 Essential (primary) hypertension: Secondary | ICD-10-CM

## 2021-09-27 DIAGNOSIS — R0902 Hypoxemia: Secondary | ICD-10-CM

## 2021-09-27 DIAGNOSIS — E119 Type 2 diabetes mellitus without complications: Secondary | ICD-10-CM

## 2021-09-27 LAB — CBC
HCT: 40.2 % (ref 39.0–52.0)
Hemoglobin: 13.1 g/dL (ref 13.0–17.0)
MCH: 31.1 pg (ref 26.0–34.0)
MCHC: 32.6 g/dL (ref 30.0–36.0)
MCV: 95.5 fL (ref 80.0–100.0)
Platelets: UNDETERMINED 10*3/uL (ref 150–400)
RBC: 4.21 MIL/uL — ABNORMAL LOW (ref 4.22–5.81)
RDW: 13.6 % (ref 11.5–15.5)
WBC: 20.1 10*3/uL — ABNORMAL HIGH (ref 4.0–10.5)
nRBC: 0 % (ref 0.0–0.2)

## 2021-09-27 LAB — GLUCOSE, CAPILLARY
Glucose-Capillary: 94 mg/dL (ref 70–99)
Glucose-Capillary: 111 mg/dL — ABNORMAL HIGH (ref 70–99)
Glucose-Capillary: 128 mg/dL — ABNORMAL HIGH (ref 70–99)
Glucose-Capillary: 145 mg/dL — ABNORMAL HIGH (ref 70–99)

## 2021-09-27 LAB — ECHOCARDIOGRAM COMPLETE
Area-P 1/2: 3.61 cm2
Height: 72 in
S' Lateral: 2.9 cm
Weight: 3392 oz

## 2021-09-27 LAB — BASIC METABOLIC PANEL
Anion gap: 10 (ref 5–15)
BUN: 23 mg/dL (ref 8–23)
CO2: 25 mmol/L (ref 22–32)
Calcium: 8.1 mg/dL — ABNORMAL LOW (ref 8.9–10.3)
Chloride: 95 mmol/L — ABNORMAL LOW (ref 98–111)
Creatinine, Ser: 1.46 mg/dL — ABNORMAL HIGH (ref 0.61–1.24)
GFR, Estimated: 47 mL/min — ABNORMAL LOW (ref >60)
Glucose, Bld: 98 mg/dL (ref 70–99)
Potassium: 3.3 mmol/L — ABNORMAL LOW (ref 3.5–5.1)
Sodium: 130 mmol/L — ABNORMAL LOW (ref 135–145)

## 2021-09-27 MED ORDER — AMOXICILLIN-POT CLAVULANATE 875-125 MG PO TABS
1.0000 | ORAL_TABLET | Freq: Two times a day (BID) | ORAL | 0 refills | Status: DC
  Filled 2021-09-27: qty 3, 2d supply, fill #0

## 2021-09-27 MED ORDER — HEPARIN (PORCINE) 25000 UT/250ML-% IV SOLN
1550.0000 [IU]/h | INTRAVENOUS | Status: DC
  Administered 2021-09-27: 1450 [IU]/h via INTRAVENOUS
  Administered 2021-09-28 – 2021-09-29 (×2): 1550 [IU]/h via INTRAVENOUS
  Filled 2021-09-27 (×3): qty 250

## 2021-09-27 MED ORDER — POTASSIUM CHLORIDE CRYS ER 20 MEQ PO TBCR
40.0000 meq | EXTENDED_RELEASE_TABLET | ORAL | Status: AC
  Administered 2021-09-27 (×2): 40 meq via ORAL
  Filled 2021-09-27 (×2): qty 2

## 2021-09-27 MED ORDER — GABAPENTIN 100 MG PO CAPS
100.0000 mg | ORAL_CAPSULE | Freq: Once | ORAL | Status: AC
  Administered 2021-09-27: 100 mg via ORAL
  Filled 2021-09-27: qty 1

## 2021-09-27 MED ORDER — EMPAGLIFLOZIN 10 MG PO TABS
10.0000 mg | ORAL_TABLET | Freq: Every day | ORAL | 0 refills | Status: DC
  Filled 2021-09-27: qty 14, 14d supply, fill #0

## 2021-09-27 MED ORDER — GABAPENTIN 100 MG PO CAPS: 100.0000 mg | ORAL_CAPSULE | Freq: Three times a day (TID) | ORAL | Status: DC

## 2021-09-27 MED ORDER — IOHEXOL 350 MG/ML SOLN
65.0000 mL | Freq: Once | INTRAVENOUS | Status: AC | PRN
  Administered 2021-09-27: 65 mL via INTRAVENOUS

## 2021-09-27 MED ORDER — GABAPENTIN 100 MG PO CAPS
100.0000 mg | ORAL_CAPSULE | Freq: Three times a day (TID) | ORAL | Status: DC
  Administered 2021-09-27 – 2021-09-29 (×7): 100 mg via ORAL
  Filled 2021-09-27 (×7): qty 1

## 2021-09-27 NOTE — Progress Notes (Signed)
Physical Therapy Treatment Patient Details Name: Joshua [name] MRN: [id number] DOB: [birth year] Today's Date: 09/27/2021   History of Present Illness 85 y.o. male presenting with abdominal pain likely secondary to cholecystitis. 09/24/21 Laparoscopic cholecystectomy  PMH is significant for T2DM, COPD, HLD, HTN    PT Comments    Patient groggy and requiring increased time to follow commands for mobility. Pt on 3L O2 initially, however required incr to 4L and sats still decreased to 85% during ambulation x 45 ft. Wife present during session and expressing concern over caring for pt with the degree of confusion he had overnight (pt was combative with nursing).     Recommendations for follow up therapy are one component of a multi-disciplinary discharge planning process, led by the attending physician.  Recommendations may be updated based on patient status, additional functional criteria and insurance authorization.  Follow Up Recommendations  Home health PT;Supervision for mobility/OOB     Equipment Recommendations  None recommended by PT    Recommendations for Other Services       Precautions / Restrictions Precautions Precautions: Fall Precaution Comments: Pt. reports 2 falls in the past 6 months.     Mobility  Bed Mobility               General bed mobility comments: pt sitting EOB with family present on arrival    Transfers Overall transfer level: Needs assistance Equipment used: Rolling walker (2 wheeled) Transfers: Sit to/from [employer] Sit to Stand: Min assist         General transfer comment: cues for proper hand placement. Boosting assist to initiate anterior wt-shift for 3 out of 5 reps.  Ambulation/Gait Ambulation/Gait assistance: Min assist;+2 safety/equipment Gait Distance (Feet): 45 Feet Assistive device: Rolling walker (2 wheeled) Gait Pattern/deviations: Step-to pattern Gait velocity: decr   General Gait Details: pt pushing  RW too far ahead and could not correct with cues; increased difficulty figuring out how to turn the RW when he got to the door, better turn when he got back to the chair   Stairs             Wheelchair Mobility    Modified Rankin (Stroke Patients Only)       Balance Overall balance assessment: Mild deficits observed, not formally tested Sitting-balance support: No upper extremity supported;Feet supported Sitting balance-Leahy Scale: Good     Standing balance support: Bilateral upper extremity supported Standing balance-Leahy Scale: Poor                              Cognition Arousal/Alertness: Awake/alert Behavior During Therapy: WFL for tasks assessed/performed Overall Cognitive Status: Impaired/Different from baseline Area of Impairment: Orientation;Attention;Following commands;Awareness;Problem solving                 Orientation Level: Time Current Attention Level: Sustained   Following Commands: Follows one step commands with increased time   Awareness: Intellectual Problem Solving: Slow processing;Difficulty sequencing;Requires verbal cues;Requires tactile cues        Exercises Other Exercises Other Exercises: sit to stand x 5 reps with less assist as he repeated    General Comments General comments (skin integrity, edema, etc.): Wife present during session      Pertinent Vitals/Pain Pain Assessment: Faces Faces Pain Scale: Hurts a little bit Pain Location: abd Pain Descriptors / Indicators: Guarding;Discomfort Pain Intervention(s): Limited activity within patient's tolerance    Home Living Family/patient expects to be discharged to::  Private residence Living Arrangements: Spouse/significant other Available Help at Discharge: Family;Available 24 hours/day Type of Home: House Home Access: Stairs to enter Entrance Stairs-Rails: Right Home Layout: One level Home Equipment: Walker - 2 wheels;Shower seat      Prior Function  Level of Independence: Needs assistance  Gait / Transfers Assistance Needed: inside uses no device usually; outside either use RW or pushes a wheelbarrow (with a folded chair in it in case he needs to sit down because of his back pain) ADL's / Homemaking Assistance Needed: mod i per pt.     PT Goals (current goals can now be found in the care plan section) Acute Rehab PT Goals Patient Stated Goal: to get stronger and go home. PT Goal Formulation: With patient Time For Goal Achievement: 10/09/21 Potential to Achieve Goals: Good Progress towards PT goals: Progressing toward goals    Frequency    Min 3X/week      PT Plan Current plan remains appropriate    Co-evaluation              AM-PAC PT "6 Clicks" Mobility   Outcome Measure  Help needed turning from your back to your side while in a flat bed without using bedrails?: A Little Help needed moving from lying on your back to sitting on the side of a flat bed without using bedrails?: A Little Help needed moving to and from a bed to a chair (including a wheelchair)?: A Little Help needed standing up from a chair using your arms (e.g., wheelchair or bedside chair)?: A Little Help needed to walk in hospital room?: Total Help needed climbing 3-5 steps with a railing? : Total 6 Click Score: 14    End of Session Equipment Utilized During Treatment: Gait belt;Oxygen Activity Tolerance: Patient tolerated treatment well Patient left: with call bell/phone within reach;in bed;with bed alarm set;with family/visitor present Nurse Communication: Mobility status PT Visit Diagnosis: Muscle weakness (generalized) (M62.81);Difficulty in walking, not elsewhere classified (R26.2);Pain Pain - Right/Left:  (midline) Pain - part of body:  (abd)     Time: 4481-8563 PT Time Calculation (min) (ACUTE ONLY): 29 min  Charges:  $Gait Training: 23-37 mins                      Jerolyn Center, PT Acute Rehabilitation Services  Pager  [phone] Office [phone]    Zena Amos 09/27/2021, 12:04 PM

## 2021-09-27 NOTE — Consult Note (Signed)
CARDIOLOGY CONSULT NOTE  Patient ID: Joshua [name] MRN: [id number] DOB/AGE: 83-Nov-1937 89 y.o.  Admit date: 09/23/2021 Attending physician: Carney Living, MD Primary Physician:  Kaleen Mask, MD Outpatient Cardiologist: NA Inpatient Cardiologist: Tessa Lerner, DO, [facility]  Reason of consultation: Abnormal Echo , request for TEE  Referring physician: Carney Living, MD  Chief complaint: Abdominal pain  HPI:  Joshua [name] is a 85 y.o. Caucasian male who presents with a chief complaint of " abdominal pain." His past medical history and cardiovascular risk factors include: COPD, non-insulin-dependent diabetes mellitus type 2, hypertension, hyperlipidemia, former smoker, advanced age.  Patient presented to the hospital with symptoms of abdominal pain and was noted to have acute cholecystitis and underwent lap cholecystectomy and currently is postop day 3.  Due to shortness of breath and requiring nasal cannula oxygen due to hypoxia an echocardiogram was ordered to evaluate for congestive heart failure.  However, patient was noted to have concern for mobile echolucent mass concerning for either thrombus in transit versus vegetation therefore cardiology was consulted for further evaluation and management.  Patient is accompanied by his wife Clio Lions at the time of the evaluation at bedside.  Patient is sitting upright in bed, appears short of breath, dyspneic, on nasal cannula oxygen saturating well.  Denies any chest pain currently or prior to his hospitalization.  Patient's wife states that he has always remained short of breath with effort related activities but they were contributing that to his COPD.  Patient's wife states that he supposed to be on inhalers but patient chooses not to be on it.  Appears euvolemic and not in congestive heart failure.  No prior history of deep venous thrombosis or pulmonary embolism.  ALLERGIES: No Known Allergies  PAST MEDICAL  HISTORY: Past Medical History:  Diagnosis Date   Anemia    hx.   Arthritis    COPD (chronic obstructive pulmonary disease) (HCC)    reports at one poin he had copd but he has stopped smoking and now doesnt have any trouble breathing or  need an inhaler    Diabetes mellitus without complication (HCC)    type 2   Headache    none recently    High cholesterol    History of kidney stones    HOH (hard of hearing)    both ears   Hypertension    Pneumonia    years ago    PAST SURGICAL HISTORY: Past Surgical History:  Procedure Laterality Date   ANKLE FRACTURE SURGERY Left    when 48-68- years old   ANTERIOR CERVICAL DECOMP/DISCECTOMY FUSION N/A 07/11/2017   Procedure: Anterior Cervical Decompression/Discectomy Fusion  - Cervical three-four;  Surgeon: Donalee Citrin, MD;  Location: [facility] OR;  Service: Neurosurgery;  Laterality: N/A; limited extension of neck currently    CATARACT EXTRACTION Bilateral    CHOLECYSTECTOMY N/A 09/24/2021   Procedure: LAPAROSCOPIC CHOLECYSTECTOMY;  Surgeon: Fritzi Mandes, MD;  Location[facility] OR;  Service: General;  Laterality: N/A;   EXTRACORPOREAL SHOCK WAVE LITHOTRIPSY Right 07/14/2020   Procedure: EXTRACORPOREAL SHOCK WAVE LITHOTRIPSY (ESWL);  Surgeon: Bjorn Pippin, MD;  Location: [facility];  Service: Urology;  Laterality: Right;   FRACTURE SURGERY      fx. lower left leg at age 23-13   TOTAL KNEE ARTHROPLASTY Right 11/11/2017   Procedure: RIGHT TOTAL KNEE ARTHROPLASTY;  Surgeon: Ollen Gross, MD;  Location: [facility] ORS;  Service: Orthopedics;  Laterality: Right;   TOTAL KNEE ARTHROPLASTY Left 06/01/2019   Procedure:  LEFT TOTAL KNEE ARTHROPLASTY;  Surgeon: Ollen Gross, MD;  Location: [facility] ORS;  Service: Orthopedics;  Laterality: Left;     FAMILY HISTORY: The patient family history includes Strabismus in his daughter. No premature history of CAD or SCD.    SOCIAL HISTORY:  The patient  reports that he quit smoking about 5 years ago. His  smoking use included cigarettes. He has a 120.00 pack-year smoking history. He has never used smokeless tobacco. He reports that he does not drink alcohol and does not use drugs.  MEDICATIONS: Current Outpatient Medications  Medication Instructions   acetaminophen (TYLENOL) 650 mg, Oral, Every 8 hours PRN   acetaminophen (TYLENOL) 1,000 mg, Oral, Every 6 hours PRN   albuterol (PROAIR HFA) 108 (90 Base) MCG/ACT inhaler 2 puffs, Inhalation, Every 4 hours PRN   amLODipine (NORVASC) 10 mg, Oral, Daily at bedtime   amoxicillin-clavulanate (AUGMENTIN) 875-125 MG tablet 1 tablet, Oral, Every 12 hours   [START ON 09/28/2021] empagliflozin (JARDIANCE) 10 mg, Oral, Daily   gabapentin (NEURONTIN) 100 mg, Oral, 3 times daily, 100 mg TID x 2 weeks, then 100 mg BID x 2 weeks, then 100 mg QD x 2 weeks.   glipiZIDE (GLUCOTROL XL) 20 mg, Oral, Daily with breakfast   oxymetazoline (AFRIN) 0.05 % nasal spray 2 sprays, Each Nare, Daily at bedtime   pioglitazone (ACTOS) 45 mg, Oral, Daily   predniSONE (DELTASONE) 10 mg, Oral, Daily PRN   traMADol (ULTRAM) 50 mg, Oral, Every 6 hours PRN    REVIEW OF SYSTEMS: Review of Systems  Constitutional: Negative for chills and fever.  HENT:  Negative for hoarse voice and nosebleeds.   Eyes:  Negative for discharge, double vision and pain.  Cardiovascular:  Positive for dyspnea on exertion (chronic and stable). Negative for chest pain, claudication, leg swelling, near-syncope, orthopnea, palpitations, paroxysmal nocturnal dyspnea and syncope.  Respiratory:  Positive for shortness of breath (chronic and stable). Negative for hemoptysis.   Musculoskeletal:  Negative for muscle cramps and myalgias.  Gastrointestinal:  Negative for abdominal pain, constipation, diarrhea, hematemesis, hematochezia, melena, nausea and vomiting.  Neurological:  Negative for dizziness and light-headedness.  All other systems reviewed and are negative.  PHYSICAL EXAM: Vitals with BMI  09/27/2021 09/27/2021 09/27/2021  Height - - -  Weight - - -  BMI - - -  Systolic 135 140 893  Diastolic 62 63 78  Pulse 99 90 -     Intake/Output Summary (Last 24 hours) at 09/27/2021 1614 Last data filed at 09/26/2021 1942 Gross per 24 hour  Intake --  Output 450 ml  Net -450 ml    Net IO Since Admission: -274.53 mL [09/27/21 1614]  CONSTITUTIONAL: Appears older than stated age, hemodynamically stable, pursed breathing, currently on nasal cannula oxygen. SKIN: Skin is warm and dry. No rash noted. No cyanosis. No pallor. No jaundice HEAD: Normocephalic and atraumatic.  EYES: No scleral icterus MOUTH/THROAT: Moist oral membranes.  Poor dental hygiene.  Partial dentures. NECK: No JVD present. No thyromegaly noted. No carotid bruits  LYMPHATIC: No visible cervical adenopathy.  CHEST Normal respiratory effort. No intercostal retractions  LUNGS: Poor air exchange bilaterally, decreased breath sounds, no rhonchi's or rales. CARDIOVASCULAR: Tachycardia, positive S1-S2, no murmurs rubs or gallops appreciated. ABDOMINAL: Soft, nontender, nondistended, positive bowel sounds in all quadrants no apparent ascites.  EXTREMITIES: No peripheral edema HEMATOLOGIC: No significant bruising NEUROLOGIC: Oriented to person, place, and time. Nonfocal. Normal muscle tone.  PSYCHIATRIC: Normal mood and affect. Normal behavior. Cooperative  RADIOLOGY:  DG Chest 2 View  Result Date: 09/25/2021 CLINICAL DATA:  Shortness of breath. EXAM: CHEST - 2 VIEW COMPARISON:  Chest x-ray 09/23/2021. FINDINGS: Lung volumes are low. There are patchy airspace opacities in the left lower lung. There is atelectasis in the right lung base. Small pleural effusions are present. The heart is enlarged. There is no pneumothorax or acute fracture. IMPRESSION: 1. Cardiomegaly with small bilateral pleural effusions and left basilar atelectasis/airspace disease. T Electronically Signed   By: Darliss Cheney M.D.   On: 09/25/2021 20:28    ECHOCARDIOGRAM COMPLETE  Result Date: 09/27/2021    ECHOCARDIOGRAM REPORT   Patient Name:   SLOAN TAKAGI Date of Exam: 09/27/2021 Medical Rec #:  [id number]       Height:       72.0 in Accession #:    [id number]      Weight:       212.0 lb Date of Birth:  Jan 22, 1936       BSA:          2.184 m Patient Age:    85 years        BP:           135/62 mmHg Patient Gender: M               HR:           104 bpm. Exam Location:  Inpatient Procedure: 2D Echo, Color Doppler and Cardiac Doppler Indications:    R06.9 DOE  History:        Patient has no prior history of Echocardiogram examinations.                 COPD; Risk Factors:Hypertension, Diabetes and Dyslipidemia.  Sonographer:    Irving Burton Senior RDCS Referring Phys: [id number] MARSHALL L CHAMBLISS  Sonographer Comments: Technically difficult due to COPD IMPRESSIONS  1. Left ventricular ejection fraction, by estimation, is 65 to 70%. The left ventricle has normal function. The left ventricle has no regional wall motion abnormalities. There is mild concentric left ventricular hypertrophy. Left ventricular diastolic parameters are consistent with Grade I diastolic dysfunction (impaired relaxation).  2. On short axis view (frame 16) there is a higly mobile echolucent mass ( 1.14 cm x 0.99 cm) which is concerning for a thrombus in transit vs vegetation. Unclear if this attaches to the RV wall due to poor visibility. Right ventricular systolic function was not well visualized. The right ventricular size is normal. Tricuspid regurgitation signal is inadequate for assessing PA pressure.  3. Left atrial size was mildly dilated.  4. The mitral valve is normal in structure. No evidence of mitral valve regurgitation. No evidence of mitral stenosis.  5. The aortic valve is tricuspid. Aortic valve regurgitation is not visualized. No aortic stenosis is present.  6. The inferior vena cava is normal in size with greater than 50% respiratory variability, suggesting right atrial pressure  of 3 mmHg. Conclusion(s)/Recommendation(s): Findings concerning for clot in transit vs vegetaion, would recommend Transesophageal Echocardiogram for clarification. FINDINGS  Left Ventricle: Left ventricular ejection fraction, by estimation, is 65 to 70%. The left ventricle has normal function. The left ventricle has no regional wall motion abnormalities. The left ventricular internal cavity size was normal in size. There is  mild concentric left ventricular hypertrophy. Left ventricular diastolic parameters are consistent with Grade I diastolic dysfunction (impaired relaxation). Right Ventricle: On short axis view (frame 16) there is a higly mobile echolucent mass ( 1.14 cm x 0.99 cm) which is concerning  for a thrombus in transit vs vegetation. Unclear if this attaches to the RV wall due to poor visibility. The right ventricular  size is normal. No increase in right ventricular wall thickness. Right ventricular systolic function was not well visualized. Tricuspid regurgitation signal is inadequate for assessing PA pressure. Left Atrium: Left atrial size was mildly dilated. Right Atrium: Right atrial size was not well visualized. Pericardium: There is no evidence of pericardial effusion. Mitral Valve: The mitral valve is normal in structure. No evidence of mitral valve regurgitation. No evidence of mitral valve stenosis. Tricuspid Valve: The tricuspid valve is normal in structure. Tricuspid valve regurgitation is not demonstrated. No evidence of tricuspid stenosis. Aortic Valve: The aortic valve is tricuspid. Aortic valve regurgitation is not visualized. No aortic stenosis is present. Pulmonic Valve: The pulmonic valve was normal in structure. Pulmonic valve regurgitation is not visualized. No evidence of pulmonic stenosis. Aorta: The aortic root is normal in size and structure. Venous: The inferior vena cava is normal in size with greater than 50% respiratory variability, suggesting right atrial pressure of 3 mmHg.  IAS/Shunts: No atrial level shunt detected by color flow Doppler.  LEFT VENTRICLE PLAX 2D LVIDd:         4.60 cm   Diastology LVIDs:         2.90 cm   LV e' medial:    5.33 cm/s LV PW:         1.00 cm   LV E/e' medial:  9.7 LV IVS:        1.20 cm   LV e' lateral:   8.38 cm/s LVOT diam:     1.90 cm   LV E/e' lateral: 6.2 LV SV:         46 LV SV Index:   21 LVOT Area:     2.84 cm  LEFT ATRIUM           Index LA diam:      3.60 cm 1.65 cm/m LA Vol (A2C): 28.4 ml 13.00 ml/m LA Vol (A4C): 86.4 ml 39.56 ml/m  AORTIC VALVE LVOT Vmax:   84.20 cm/s LVOT Vmean:  61.500 cm/s LVOT VTI:    0.163 m  AORTA Ao Root diam: 3.60 cm Ao Asc diam:  3.40 cm MITRAL VALVE MV Area (PHT): 3.61 cm     SHUNTS MV Decel Time: 210 msec     Systemic VTI:  0.16 m MV E velocity: 51.90 cm/s   Systemic Diam: 1.90 cm MV A velocity: 139.00 cm/s MV E/A ratio:  0.37 Kardie Tobb DO Electronically signed by Thomasene Ripple DO Signature Date/Time: 09/27/2021/2:26:20 PM    Final     LABORATORY DATA: Lab Results  Component Value Date   WBC 20.6 (H) 09/26/2021   HGB 13.0 09/26/2021   HCT 39.5 09/26/2021   MCV 95.2 09/26/2021   PLT PLATELET CLUMPS NOTED ON SMEAR, UNABLE TO ESTIMATE 09/26/2021    Recent Labs  Lab 09/25/21 0928 09/25/21 2223 09/27/21 0121  NA 137   < > 130*  K 4.1   < > 3.3*  CL 100   < > 95*  CO2 25   < > 25  BUN 31*   < > 23  CREATININE 1.96*   < > 1.46*  CALCIUM 8.4*   < > 8.1*  PROT 5.7*  --   --   BILITOT 1.1  --   --   ALKPHOS 35*  --   --   ALT 39  --   --  AST 42*  --   --   GLUCOSE 129*   < > 98   < > = values in this interval not displayed.    Lipid Panel  No results found for: CHOL, HDL, LDLCALC, LDLDIRECT, TRIG, CHOLHDL  BNP (last 3 results) Recent Labs    09/25/21 2223  BNP 106.3*    HEMOGLOBIN A1C Lab Results  Component Value Date   HGBA1C 7.2 (H) 09/25/2021   MPG 159.94 09/25/2021    Cardiac Panel (last 3 results) No results for input(s): CKTOTAL, CKMB, TROPONINIHS, RELINDX in the  last 72 hours.   TSH No results for input(s): TSH in the last 8760 hours.   CARDIAC DATABASE: EKG: 09/23/2021: Normal sinus rhythm, 99 bpm, left axis deviation, consider old inferior infarct, without underlying ischemia or injury pattern.  Echocardiogram: 09/27/2021:  1. Left ventricular ejection fraction, by estimation, is 65 to 70%. The  left ventricle has normal function. The left ventricle has no regional  wall motion abnormalities. There is mild concentric left ventricular  hypertrophy. Left ventricular diastolic  parameters are consistent with Grade I diastolic dysfunction (impaired  relaxation).   2. On short axis view (frame 16) there is a higly mobile echolucent mass  ( 1.14 cm x 0.99 cm) which is concerning for a thrombus in transit vs  vegetation. Unclear if this attaches to the RV wall due to poor  visibility. Right ventricular systolic  function was not well visualized. The right ventricular size is normal.  Tricuspid regurgitation signal is inadequate for assessing PA pressure.   3. Left atrial size was mildly dilated.   4. The mitral valve is normal in structure. No evidence of mitral valve  regurgitation. No evidence of mitral stenosis.   5. The aortic valve is tricuspid. Aortic valve regurgitation is not  visualized. No aortic stenosis is present.   6. The inferior vena cava is normal in size with greater than 50%  respiratory variability, suggesting right atrial pressure of 3 mmHg.   IMPRESSION & RECOMMENDATIONS: Luisdavid D Socarras is a 85 y.o. Caucasian male whose past medical history and cardiovascular risk factors include: COPD, non-insulin-dependent diabetes mellitus type 2, hypertension, hyperlipidemia, former smoker, advanced age.  Impression: Abnormal echocardiogram Hypoxia requiring nasal cannula oxygen Tachycardia Abnormal EKG. Non-insulin-dependent diabetes mellitus type 2. Hypertension. Hyperlipidemia. COPD. Former smoker  Plan: Cardiology was  consulted today for transesophageal echocardiogram to further evaluate the findings of the transthoracic echo which notes mobile echolucent mass 1.14 cm x 0.99 cm concerning for either thrombus in transit versus vegetation.  Transthoracic echo images were independently reviewed.  Due to technically difficult study the overall accuracy of such finding is limited.  However, since the clinical suspicion that is brought forward additional work-up is warranted.  Patient is postop day 3 after cholecystectomy, tachycardia on telemetry over the last 24 hours, tachypnea, hypoxia requiring nasal cannula oxygen (new finding), and current echocardiogram findings would recommend ruling out DVT/PE.  Recommend either initiated work-up with D-dimer or proceeding with lower extremity duplex and CT PE protocol.  This was discussed with primary team (family medicine residents) who will order the diagnostic testing.  Labs are notable for leukocytosis (WBC 20.6 as of yesterday 09/26/2021), patient is afebrile over the last 24 hours, blood cultures from 09/23/2021 notes no growth (report is not final).  Repeat CBC.  Did discuss with the patient and his wife with regards to proceeding with transesophageal echocardiogram to further evaluate for either the presence or absence of vegetation/thrombus in transit.    After careful review of history and examination, the risks, benefits of transesophageal echocardiogram, and alternatives have been explained to the patient and his wife [place] Lions. Complications include but not limited to esophageal perforation (rare), gastrointestinal bleeding (rare), cardiac arrhythmia which can include cardiac arrest and death (rare), pharyngeal irritation / discomfort with swallowing / hematoma, methemoglobinemia, bronchospasm, transient hypoxia, nonsustained ventricular tachycardia, transient atrial fibrillation, minimal hemoptysis, vomiting, hypotension, respiratory compromise, reaction to medications,  unavoidable damage to teeth and gums, aspiration pneumonia  were reviewed with the patient.  Patient voices understands, provides verbal feedback, questions answered, and patient and his wife Jonetta Speak wishes to proceed with the procedure.  N.p.o. after midnight.  Depending on endoscopy schedule the test will be scheduled for 09/28/2021 or Friday (09/29/2021).  Will follow the patient with you.    Total encounter time 86 minutes. *Total Encounter Time as defined by the Centers for Medicare and Medicaid Services includes, in addition to the face-to-face time of a patient visit (documented in the note above) non-face-to-face time: obtaining and reviewing outside history, ordering and reviewing medications, tests or procedures, care coordination (communications with other health care professionals or caregivers) and documentation in the medical record.  Patient's questions and concerns were addressed to his satisfaction. He voices understanding of the instructions provided during this encounter.   This note was created using a voice recognition software as a result there may be grammatical errors inadvertently enclosed that do not reflect the nature of this encounter. Every attempt is made to correct such errors.  Delilah Shan [facility]  Pager: [phone] Office: [phone] 09/27/2021, 4:14 PM

## 2021-09-27 NOTE — Progress Notes (Signed)
Lower extremity venous bilateral study completed.  Preliminary results relayed to Maralyn Sago, RN.  See CV Proc for preliminary results report.   Jean Rosenthal, RDMS, RVT

## 2021-09-27 NOTE — H&P (View-Only) (Signed)
CARDIOLOGY CONSULT NOTE  Patient ID: Joshua [name] MRN: [id number] DOB/AGE: 83-Nov-1937 83 y.o.  Admit date: 09/23/2021 Attending physician: Carney Living, MD Primary Physician:  Kaleen Mask, MD Outpatient Cardiologist: NA Inpatient Cardiologist: Tessa Lerner, DO, [facility]  Reason of consultation: Abnormal Echo , request for TEE  Referring physician: Carney Living, MD  Chief complaint: Abdominal pain  HPI:  Joshua [name] is a 85 y.o. Caucasian male who presents with a chief complaint of " abdominal pain." His past medical history and cardiovascular risk factors include: COPD, non-insulin-dependent diabetes mellitus type 2, hypertension, hyperlipidemia, former smoker, advanced age.  Patient presented to the hospital with symptoms of abdominal pain and was noted to have acute cholecystitis and underwent lap cholecystectomy and currently is postop day 3.  Due to shortness of breath and requiring nasal cannula oxygen due to hypoxia an echocardiogram was ordered to evaluate for congestive heart failure.  However, patient was noted to have concern for mobile echolucent mass concerning for either thrombus in transit versus vegetation therefore cardiology was consulted for further evaluation and management.  Patient is accompanied by his wife Clio Lions at the time of the evaluation at bedside.  Patient is sitting upright in bed, appears short of breath, dyspneic, on nasal cannula oxygen saturating well.  Denies any chest pain currently or prior to his hospitalization.  Patient's wife states that he has always remained short of breath with effort related activities but they were contributing that to his COPD.  Patient's wife states that he supposed to be on inhalers but patient chooses not to be on it.  Appears euvolemic and not in congestive heart failure.  No prior history of deep venous thrombosis or pulmonary embolism.  ALLERGIES: No Known Allergies  PAST MEDICAL  HISTORY: Past Medical History:  Diagnosis Date   Anemia    hx.   Arthritis    COPD (chronic obstructive pulmonary disease) (HCC)    reports at one poin he had copd but he has stopped smoking and now doesnt have any trouble breathing or  need an inhaler    Diabetes mellitus without complication (HCC)    type 2   Headache    none recently    High cholesterol    History of kidney stones    HOH (hard of hearing)    both ears   Hypertension    Pneumonia    years ago    PAST SURGICAL HISTORY: Past Surgical History:  Procedure Laterality Date   ANKLE FRACTURE SURGERY Left    when 48-68- years old   ANTERIOR CERVICAL DECOMP/DISCECTOMY FUSION N/A 07/11/2017   Procedure: Anterior Cervical Decompression/Discectomy Fusion  - Cervical three-four;  Surgeon: Donalee Citrin, MD;  Location: [facility] OR;  Service: Neurosurgery;  Laterality: N/A; limited extension of neck currently    CATARACT EXTRACTION Bilateral    CHOLECYSTECTOMY N/A 09/24/2021   Procedure: LAPAROSCOPIC CHOLECYSTECTOMY;  Surgeon: Fritzi Mandes, MD;  Location[facility] OR;  Service: General;  Laterality: N/A;   EXTRACORPOREAL SHOCK WAVE LITHOTRIPSY Right 07/14/2020   Procedure: EXTRACORPOREAL SHOCK WAVE LITHOTRIPSY (ESWL);  Surgeon: Bjorn Pippin, MD;  Location: [facility];  Service: Urology;  Laterality: Right;   FRACTURE SURGERY      fx. lower left leg at age 83-13   TOTAL KNEE ARTHROPLASTY Right 11/11/2017   Procedure: RIGHT TOTAL KNEE ARTHROPLASTY;  Surgeon: Ollen Gross, MD;  Location: [facility] ORS;  Service: Orthopedics;  Laterality: Right;   TOTAL KNEE ARTHROPLASTY Left 06/01/2019   Procedure:  LEFT TOTAL KNEE ARTHROPLASTY;  Surgeon: Ollen Gross, MD;  Location: [facility] ORS;  Service: Orthopedics;  Laterality: Left;     FAMILY HISTORY: The patient family history includes Strabismus in his daughter. No premature history of CAD or SCD.    SOCIAL HISTORY:  The patient  reports that he quit smoking about 5 years ago. His  smoking use included cigarettes. He has a 120.00 pack-year smoking history. He has never used smokeless tobacco. He reports that he does not drink alcohol and does not use drugs.  MEDICATIONS: Current Outpatient Medications  Medication Instructions   acetaminophen (TYLENOL) 650 mg, Oral, Every 8 hours PRN   acetaminophen (TYLENOL) 1,000 mg, Oral, Every 6 hours PRN   albuterol (PROAIR HFA) 108 (90 Base) MCG/ACT inhaler 2 puffs, Inhalation, Every 4 hours PRN   amLODipine (NORVASC) 10 mg, Oral, Daily at bedtime   amoxicillin-clavulanate (AUGMENTIN) 875-125 MG tablet 1 tablet, Oral, Every 12 hours   [START ON 09/28/2021] empagliflozin (JARDIANCE) 10 mg, Oral, Daily   gabapentin (NEURONTIN) 100 mg, Oral, 3 times daily, 100 mg TID x 2 weeks, then 100 mg BID x 2 weeks, then 100 mg QD x 2 weeks.   glipiZIDE (GLUCOTROL XL) 20 mg, Oral, Daily with breakfast   oxymetazoline (AFRIN) 0.05 % nasal spray 2 sprays, Each Nare, Daily at bedtime   pioglitazone (ACTOS) 45 mg, Oral, Daily   predniSONE (DELTASONE) 10 mg, Oral, Daily PRN   traMADol (ULTRAM) 50 mg, Oral, Every 6 hours PRN    REVIEW OF SYSTEMS: Review of Systems  Constitutional: Negative for chills and fever.  HENT:  Negative for hoarse voice and nosebleeds.   Eyes:  Negative for discharge, double vision and pain.  Cardiovascular:  Positive for dyspnea on exertion (chronic and stable). Negative for chest pain, claudication, leg swelling, near-syncope, orthopnea, palpitations, paroxysmal nocturnal dyspnea and syncope.  Respiratory:  Positive for shortness of breath (chronic and stable). Negative for hemoptysis.   Musculoskeletal:  Negative for muscle cramps and myalgias.  Gastrointestinal:  Negative for abdominal pain, constipation, diarrhea, hematemesis, hematochezia, melena, nausea and vomiting.  Neurological:  Negative for dizziness and light-headedness.  All other systems reviewed and are negative.  PHYSICAL EXAM: Vitals with BMI  09/27/2021 09/27/2021 09/27/2021  Height - - -  Weight - - -  BMI - - -  Systolic 135 140 893  Diastolic 62 63 78  Pulse 99 90 -     Intake/Output Summary (Last 24 hours) at 09/27/2021 1614 Last data filed at 09/26/2021 1942 Gross per 24 hour  Intake --  Output 450 ml  Net -450 ml    Net IO Since Admission: -274.53 mL [09/27/21 1614]  CONSTITUTIONAL: Appears older than stated age, hemodynamically stable, pursed breathing, currently on nasal cannula oxygen. SKIN: Skin is warm and dry. No rash noted. No cyanosis. No pallor. No jaundice HEAD: Normocephalic and atraumatic.  EYES: No scleral icterus MOUTH/THROAT: Moist oral membranes.  Poor dental hygiene.  Partial dentures. NECK: No JVD present. No thyromegaly noted. No carotid bruits  LYMPHATIC: No visible cervical adenopathy.  CHEST Normal respiratory effort. No intercostal retractions  LUNGS: Poor air exchange bilaterally, decreased breath sounds, no rhonchi's or rales. CARDIOVASCULAR: Tachycardia, positive S1-S2, no murmurs rubs or gallops appreciated. ABDOMINAL: Soft, nontender, nondistended, positive bowel sounds in all quadrants no apparent ascites.  EXTREMITIES: No peripheral edema HEMATOLOGIC: No significant bruising NEUROLOGIC: Oriented to person, place, and time. Nonfocal. Normal muscle tone.  PSYCHIATRIC: Normal mood and affect. Normal behavior. Cooperative  RADIOLOGY:  DG Chest 2 View  Result Date: 09/25/2021 CLINICAL DATA:  Shortness of breath. EXAM: CHEST - 2 VIEW COMPARISON:  Chest x-ray 09/23/2021. FINDINGS: Lung volumes are low. There are patchy airspace opacities in the left lower lung. There is atelectasis in the right lung base. Small pleural effusions are present. The heart is enlarged. There is no pneumothorax or acute fracture. IMPRESSION: 1. Cardiomegaly with small bilateral pleural effusions and left basilar atelectasis/airspace disease. T Electronically Signed   By: Darliss Cheney M.D.   On: 09/25/2021 20:28    ECHOCARDIOGRAM COMPLETE  Result Date: 09/27/2021    ECHOCARDIOGRAM REPORT   Patient Name:   Joshua [name] Date of Exam: 09/27/2021 Medical Rec #:  [id number]       Height:       72.0 in Accession #:    [id number]      Weight:       212.0 lb Date of Birth:  Jan 22, 1936       BSA:          2.184 m Patient Age:    85 years        BP:           135/62 mmHg Patient Gender: M               HR:           104 bpm. Exam Location:  Inpatient Procedure: 2D Echo, Color Doppler and Cardiac Doppler Indications:    R06.9 DOE  History:        Patient has no prior history of Echocardiogram examinations.                 COPD; Risk Factors:Hypertension, Diabetes and Dyslipidemia.  Sonographer:    Irving Burton Senior RDCS Referring Phys: [id number] MARSHALL L CHAMBLISS  Sonographer Comments: Technically difficult due to COPD IMPRESSIONS  1. Left ventricular ejection fraction, by estimation, is 65 to 70%. The left ventricle has normal function. The left ventricle has no regional wall motion abnormalities. There is mild concentric left ventricular hypertrophy. Left ventricular diastolic parameters are consistent with Grade I diastolic dysfunction (impaired relaxation).  2. On short axis view (frame 16) there is a higly mobile echolucent mass ( 1.14 cm x 0.99 cm) which is concerning for a thrombus in transit vs vegetation. Unclear if this attaches to the RV wall due to poor visibility. Right ventricular systolic function was not well visualized. The right ventricular size is normal. Tricuspid regurgitation signal is inadequate for assessing PA pressure.  3. Left atrial size was mildly dilated.  4. The mitral valve is normal in structure. No evidence of mitral valve regurgitation. No evidence of mitral stenosis.  5. The aortic valve is tricuspid. Aortic valve regurgitation is not visualized. No aortic stenosis is present.  6. The inferior vena cava is normal in size with greater than 50% respiratory variability, suggesting right atrial pressure  of 3 mmHg. Conclusion(s)/Recommendation(s): Findings concerning for clot in transit vs vegetaion, would recommend Transesophageal Echocardiogram for clarification. FINDINGS  Left Ventricle: Left ventricular ejection fraction, by estimation, is 65 to 70%. The left ventricle has normal function. The left ventricle has no regional wall motion abnormalities. The left ventricular internal cavity size was normal in size. There is  mild concentric left ventricular hypertrophy. Left ventricular diastolic parameters are consistent with Grade I diastolic dysfunction (impaired relaxation). Right Ventricle: On short axis view (frame 16) there is a higly mobile echolucent mass ( 1.14 cm x 0.99 cm) which is concerning  for a thrombus in transit vs vegetation. Unclear if this attaches to the RV wall due to poor visibility. The right ventricular  size is normal. No increase in right ventricular wall thickness. Right ventricular systolic function was not well visualized. Tricuspid regurgitation signal is inadequate for assessing PA pressure. Left Atrium: Left atrial size was mildly dilated. Right Atrium: Right atrial size was not well visualized. Pericardium: There is no evidence of pericardial effusion. Mitral Valve: The mitral valve is normal in structure. No evidence of mitral valve regurgitation. No evidence of mitral valve stenosis. Tricuspid Valve: The tricuspid valve is normal in structure. Tricuspid valve regurgitation is not demonstrated. No evidence of tricuspid stenosis. Aortic Valve: The aortic valve is tricuspid. Aortic valve regurgitation is not visualized. No aortic stenosis is present. Pulmonic Valve: The pulmonic valve was normal in structure. Pulmonic valve regurgitation is not visualized. No evidence of pulmonic stenosis. Aorta: The aortic root is normal in size and structure. Venous: The inferior vena cava is normal in size with greater than 50% respiratory variability, suggesting right atrial pressure of 3 mmHg.  IAS/Shunts: No atrial level shunt detected by color flow Doppler.  LEFT VENTRICLE PLAX 2D LVIDd:         4.60 cm   Diastology LVIDs:         2.90 cm   LV e' medial:    5.33 cm/s LV PW:         1.00 cm   LV E/e' medial:  9.7 LV IVS:        1.20 cm   LV e' lateral:   8.38 cm/s LVOT diam:     1.90 cm   LV E/e' lateral: 6.2 LV SV:         46 LV SV Index:   21 LVOT Area:     2.84 cm  LEFT ATRIUM           Index LA diam:      3.60 cm 1.65 cm/m LA Vol (A2C): 28.4 ml 13.00 ml/m LA Vol (A4C): 86.4 ml 39.56 ml/m  AORTIC VALVE LVOT Vmax:   84.20 cm/s LVOT Vmean:  61.500 cm/s LVOT VTI:    0.163 m  AORTA Ao Root diam: 3.60 cm Ao Asc diam:  3.40 cm MITRAL VALVE MV Area (PHT): 3.61 cm     SHUNTS MV Decel Time: 210 msec     Systemic VTI:  0.16 m MV E velocity: 51.90 cm/s   Systemic Diam: 1.90 cm MV A velocity: 139.00 cm/s MV E/A ratio:  0.37 Kardie Tobb DO Electronically signed by Thomasene Ripple DO Signature Date/Time: 09/27/2021/2:26:20 PM    Final     LABORATORY DATA: Lab Results  Component Value Date   WBC 20.6 (H) 09/26/2021   HGB 13.0 09/26/2021   HCT 39.5 09/26/2021   MCV 95.2 09/26/2021   PLT PLATELET CLUMPS NOTED ON SMEAR, UNABLE TO ESTIMATE 09/26/2021    Recent Labs  Lab 09/25/21 0928 09/25/21 2223 09/27/21 0121  NA 137   < > 130*  K 4.1   < > 3.3*  CL 100   < > 95*  CO2 25   < > 25  BUN 31*   < > 23  CREATININE 1.96*   < > 1.46*  CALCIUM 8.4*   < > 8.1*  PROT 5.7*  --   --   BILITOT 1.1  --   --   ALKPHOS 35*  --   --   ALT 39  --   --  AST 42*  --   --   GLUCOSE 129*   < > 98   < > = values in this interval not displayed.    Lipid Panel  No results found for: CHOL, HDL, LDLCALC, LDLDIRECT, TRIG, CHOLHDL  BNP (last 3 results) Recent Labs    09/25/21 2223  BNP 106.3*    HEMOGLOBIN A1C Lab Results  Component Value Date   HGBA1C 7.2 (H) 09/25/2021   MPG 159.94 09/25/2021    Cardiac Panel (last 3 results) No results for input(s): CKTOTAL, CKMB, TROPONINIHS, RELINDX in the  last 72 hours.   TSH No results for input(s): TSH in the last 8760 hours.   CARDIAC DATABASE: EKG: 09/23/2021: Normal sinus rhythm, 99 bpm, left axis deviation, consider old inferior infarct, without underlying ischemia or injury pattern.  Echocardiogram: 09/27/2021:  1. Left ventricular ejection fraction, by estimation, is 65 to 70%. The  left ventricle has normal function. The left ventricle has no regional  wall motion abnormalities. There is mild concentric left ventricular  hypertrophy. Left ventricular diastolic  parameters are consistent with Grade I diastolic dysfunction (impaired  relaxation).   2. On short axis view (frame 16) there is a higly mobile echolucent mass  ( 1.14 cm x 0.99 cm) which is concerning for a thrombus in transit vs  vegetation. Unclear if this attaches to the RV wall due to poor  visibility. Right ventricular systolic  function was not well visualized. The right ventricular size is normal.  Tricuspid regurgitation signal is inadequate for assessing PA pressure.   3. Left atrial size was mildly dilated.   4. The mitral valve is normal in structure. No evidence of mitral valve  regurgitation. No evidence of mitral stenosis.   5. The aortic valve is tricuspid. Aortic valve regurgitation is not  visualized. No aortic stenosis is present.   6. The inferior vena cava is normal in size with greater than 50%  respiratory variability, suggesting right atrial pressure of 3 mmHg.   IMPRESSION & RECOMMENDATIONS: Joshua [name] is a 85 y.o. Caucasian male whose past medical history and cardiovascular risk factors include: COPD, non-insulin-dependent diabetes mellitus type 2, hypertension, hyperlipidemia, former smoker, advanced age.  Impression: Abnormal echocardiogram Hypoxia requiring nasal cannula oxygen Tachycardia Abnormal EKG. Non-insulin-dependent diabetes mellitus type 2. Hypertension. Hyperlipidemia. COPD. Former smoker  Plan: Cardiology was  consulted today for transesophageal echocardiogram to further evaluate the findings of the transthoracic echo which notes mobile echolucent mass 1.14 cm x 0.99 cm concerning for either thrombus in transit versus vegetation.  Transthoracic echo images were independently reviewed.  Due to technically difficult study the overall accuracy of such finding is limited.  However, since the clinical suspicion that is brought forward additional work-up is warranted.  Patient is postop day 3 after cholecystectomy, tachycardia on telemetry over the last 24 hours, tachypnea, hypoxia requiring nasal cannula oxygen (new finding), and current echocardiogram findings would recommend ruling out DVT/PE.  Recommend either initiated work-up with D-dimer or proceeding with lower extremity duplex and CT PE protocol.  This was discussed with primary team (family medicine residents) who will order the diagnostic testing.  Labs are notable for leukocytosis (WBC 20.6 as of yesterday 09/26/2021), patient is afebrile over the last 24 hours, blood cultures from 09/23/2021 notes no growth (report is not final).  Repeat CBC.  Did discuss with the patient and his wife with regards to proceeding with transesophageal echocardiogram to further evaluate for either the presence or absence of vegetation/thrombus in transit.  After careful review of history and examination, the risks, benefits of transesophageal echocardiogram, and alternatives have been explained to the patient and his wife Belleville Lions. Complications include but not limited to esophageal perforation (rare), gastrointestinal bleeding (rare), cardiac arrhythmia which can include cardiac arrest and death (rare), pharyngeal irritation / discomfort with swallowing / hematoma, methemoglobinemia, bronchospasm, transient hypoxia, nonsustained ventricular tachycardia, transient atrial fibrillation, minimal hemoptysis, vomiting, hypotension, respiratory compromise, reaction to medications,  unavoidable damage to teeth and gums, aspiration pneumonia  were reviewed with the patient.  Patient voices understands, provides verbal feedback, questions answered, and patient and his wife Jonetta Speak wishes to proceed with the procedure.  N.p.o. after midnight.  Depending on endoscopy schedule the test will be scheduled for 09/28/2021 or Friday (09/29/2021).  Will follow the patient with you.    Total encounter time 86 minutes. *Total Encounter Time as defined by the Centers for Medicare and Medicaid Services includes, in addition to the face-to-face time of a patient visit (documented in the note above) non-face-to-face time: obtaining and reviewing outside history, ordering and reviewing medications, tests or procedures, care coordination (communications with other health care professionals or caregivers) and documentation in the medical record.  Patient's questions and concerns were addressed to his satisfaction. He voices understanding of the instructions provided during this encounter.   This note was created using a voice recognition software as a result there may be grammatical errors inadvertently enclosed that do not reflect the nature of this encounter. Every attempt is made to correct such errors.  Delilah Shan [facility]  Pager: [phone] Office: [phone] 09/27/2021, 4:14 PM

## 2021-09-27 NOTE — Care Management Important Message (Signed)
Important Message  Patient Details  Name: Joshua [name] MRN: [id number] Date of Birth: 07-21-1936   Medicare Important Message Given:  Yes     Dorena Bodo 09/27/2021, 3:56 PM

## 2021-09-27 NOTE — Progress Notes (Signed)
ANTICOAGULATION CONSULT NOTE - Initial Consult  Pharmacy Consult for heparin Indication: DVT  No Known Allergies  Patient Measurements: Height: 6' (182.9 cm) Weight: 96.2 kg (212 lb) IBW/kg (Calculated) : 77.6 Heparin Dosing Weight: 96kg  Vital Signs: Temp: 99.8 F (37.7 C) (10/19 2021) Temp Source: Oral (10/19 2021) BP: 127/72 (10/19 2021) Pulse Rate: 89 (10/19 2021)  Labs: Recent Labs    09/25/21 0928 09/25/21 2223 09/26/21 0142 09/27/21 0121 09/27/21 1632  HGB 13.5  --  13.0  --  13.1  HCT 42.0  --  39.5  --  40.2  PLT 163  --  PLATELET CLUMPS NOTED ON SMEAR, UNABLE TO ESTIMATE  --  PLATELET CLUMPS NOTED ON SMEAR, UNABLE TO ESTIMATE  CREATININE 1.96* 1.79* 1.68* 1.46*  --     Estimated Creatinine Clearance: 46.1 mL/min (A) (by C-G formula based on SCr of 1.46 mg/dL (H)).   Medical History: Past Medical History:  Diagnosis Date   Anemia    hx.   Arthritis    COPD (chronic obstructive pulmonary disease) (HCC)    reports at one poin he had copd but he has stopped smoking and now doesnt have any trouble breathing or  need an inhaler    Diabetes mellitus without complication (HCC)    type 2   Headache    none recently    High cholesterol    History of kidney stones    HOH (hard of hearing)    both ears   Hypertension    Pneumonia    years ago    Assessment: 13 yoM admitted w/ abdominal pain s/p lap chole on 10/16. Pt noted on ECHO to have possible thrombus vs vegetation. CT chest negative for PE but dopplers positive for chronic L DVT. Pharmacy asked to start heparin with no bolus.  Goal of Therapy:  Heparin level 0.3-0.7 units/ml Monitor platelets by anticoagulation protocol: Yes   Plan:  Heparin 1450 units/h no bolus Check heparin level in 8h  Fredonia Highland, PharmD, [place], [facility] Clinical Pharmacist [phone] Please check AMION for all [facility] numbers 09/27/2021

## 2021-09-27 NOTE — Progress Notes (Signed)
FPTS Interim Progress Note  S:Pt agitated and wanting to get up as his back is hurting. Daughter, NT and RN at bedside. NT states pt somewhat combative and daughter agrees. Pt wants to "get up"    O: BP 140/78   Pulse 98   Temp 98.1 F (36.7 C) (Axillary)   Resp (!) 25   Ht 6' (1.829 m)   Wt 96.2 kg   SpO2 97%   BMI 28.75 kg/m    GEN: restless,  MSK: hypertonicity  and mild tenderness with palpation  SKIN: ?lipoma in upper-mid thoracic area.  A/P:  Back Pain  Pt given Tramadol around 9p for back pain. He was also given melatonin. Daughter reports pt not sleeping well. Primary RN applied Voltaren gel and pt transferred to chair. Daughter is unsure of pt's medications. States pt's PCP can to the home recently to go through his meds. She is unaware if he is taking prednisone for ongoing back pain. Fear delirium is also playing a role here.  - Continue Voltaren  - Give home Gabapentin 100 mg   Katha Cabal, DO 09/27/2021, 5:06 AM PGY-3, [facility] pager [phone]

## 2021-09-27 NOTE — Progress Notes (Signed)
3 Days Post-Op  Subjective: Some delirium last night.  Drinking well.  Not eating much.  ROS: Sleepy  Objective: Vital signs in last 24 hours: Temp:  [98.1 F (36.7 C)-98.7 F (37.1 C)] 98.7 F (37.1 C) (10/19 0741) Pulse Rate:  [90-105] 90 (10/19 0741) Resp:  [17-25] 17 (10/19 0741) BP: (121-156)/(63-112) 140/63 (10/19 0741) SpO2:  [83 %-97 %] 91 % (10/19 0809) FiO2 (%):  [32 %] 32 % (10/18 1104) Last BM Date: 09/26/21  Intake/Output from previous day: 10/18 0701 - 10/19 0700 In: 240 [P.O.:240] Out: 1450 [Urine:1450] Intake/Output this shift: No intake/output data recorded.  PE: Abd: soft, minimally tender, +BS, ND, incisions c/d/I,   Lab Results:  Recent Labs    09/25/21 0928 09/26/21 0142  WBC 23.9* 20.6*  HGB 13.5 13.0  HCT 42.0 39.5  PLT 163 PLATELET CLUMPS NOTED ON SMEAR, UNABLE TO ESTIMATE    BMET Recent Labs    09/26/21 0142 09/27/21 0121  NA 134* 130*  K 4.0 3.3*  CL 100 95*  CO2 25 25  GLUCOSE 104* 98  BUN 30* 23  CREATININE 1.68* 1.46*  CALCIUM 8.3* 8.1*    PT/INR No results for input(s): LABPROT, INR in the last 72 hours.  CMP     Component Value Date/Time   NA 130 (L) 09/27/2021 0121   K 3.3 (L) 09/27/2021 0121   CL 95 (L) 09/27/2021 0121   CO2 25 09/27/2021 0121   GLUCOSE 98 09/27/2021 0121   BUN 23 09/27/2021 0121   CREATININE 1.46 (H) 09/27/2021 0121   CALCIUM 8.1 (L) 09/27/2021 0121   PROT 5.7 (L) 09/25/2021 0928   ALBUMIN 2.4 (L) 09/25/2021 0928   AST 42 (H) 09/25/2021 0928   ALT 39 09/25/2021 0928   ALKPHOS 35 (L) 09/25/2021 0928   BILITOT 1.1 09/25/2021 0928   GFRNONAA 47 (L) 09/27/2021 0121   GFRAA 44 (L) 07/02/2020 0816   Lipase     Component Value Date/Time   LIPASE 415 (H) 09/24/2021 0254       Studies/Results: DG Chest 2 View  Result Date: 09/25/2021 CLINICAL DATA:  Shortness of breath. EXAM: CHEST - 2 VIEW COMPARISON:  Chest x-ray 09/23/2021. FINDINGS: Lung volumes are low. There are patchy  airspace opacities in the left lower lung. There is atelectasis in the right lung base. Small pleural effusions are present. The heart is enlarged. There is no pneumothorax or acute fracture. IMPRESSION: 1. Cardiomegaly with small bilateral pleural effusions and left basilar atelectasis/airspace disease. T Electronically Signed   By: Darliss Cheney M.D.   On: 09/25/2021 20:28    Anti-infectives: Anti-infectives (From admission, onward)    Start     Dose/Rate Route Frequency Ordered Stop   09/25/21 1400  amoxicillin-clavulanate (AUGMENTIN) 875-125 MG per tablet 1 tablet        1 tablet Oral Every 12 hours 09/25/21 1125 09/29/21 0959   09/24/21 0800  piperacillin-tazobactam (ZOSYN) IVPB 3.375 g  Status:  Discontinued        3.375 g 12.5 mL/hr over 240 Minutes Intravenous Every 8 hours 09/24/21 0144 09/25/21 1125   09/24/21 0030  piperacillin-tazobactam (ZOSYN) IVPB 3.375 g        3.375 g 100 mL/hr over 30 Minutes Intravenous  Once 09/24/21 0027 09/24/21 0228        Assessment/Plan POD 2, s/p lap chole for gangrenous cholecystitis by Dr. Freida Busman 10/16 -needs to cont to mobilize.  Therapies recommend HH PT/OT -carb mod diet -pain controlled well  with Tylenol.  Tramadol prn -WBC down to 20K today, continue abx for 5 days total -surgically stable, dealing with some delirium -follow up with our office being arranged -tramadol sent to pharmacy   FEN - carb mod VTE - heparin TID ID - zosyn, can treat for a total of 5 days post op.  Can switch to augmentin to complete regimen at discharge  AKI DM COPD HTN --other medical problems per medicine--   LOS: 3 days    Quentin Ore , [facility] 09/27/2021, 8:59 AM Please see Amion for pager number during day hours 7:00am-4:30pm or 7:00am -11:30am on weekends

## 2021-09-27 NOTE — TOC Initial Note (Signed)
Transition of Care [facility]) -Assessment Note    Patient Details  Name: Joshua [name] MRN: [id number] Date of Birth: 08/12/1936  Transition of [facility]) CM/SW Contact:    Harriet Masson, RN Phone Number: 09/27/2021, 3:15 PM  Clinical Narrative:       Orders for home oxygen. Spoke to daughter, Aneta Mins and she stated They defer to CM to find agency to deliver home oxygen.  Spoke to Mitchell with [employer].             Expected Discharge Plan: Home w Home Health Services Barriers to Discharge: Continued Medical Work up   Patient Goals and CMS Choice Patient states their goals for this hospitalization and ongoing recovery are:: return home CMS Medicare.gov Compare Post Acute Care list provided to:: Patient Represenative (must comment) (daughter Angela Adam) Choice offered to / list presented to : Patient, Adult Children  Expected Discharge Plan and Services Expected Discharge Plan: Home w Home Health Services   Discharge Planning Services: CM Consult Post Acute Care Choice: Durable Medical Equipment, Home Health Living arrangements for the past 2 months: Single Family Home                 DME Arranged: Oxygen DME Agency: [employer] Date DME Agency Contacted: 09/27/21 Time DME Agency Contacted: [phone] Representative spoke with at DME Agency: Zack HH Arranged: PT, OT HH Agency: [facility] Date [facility] Agency Contacted: 09/26/21 Time HH Agency Contacted: 1454 Representative spoke with at [facility] Agency: Amy  Prior Living Arrangements/Services Living arrangements for the past 2 months: Single Family Home Lives with:: Spouse Patient language and need for interpreter reviewed:: Yes Do you feel safe going back to the place where you live?: Yes      Need for Family Participation in Patient Care: Yes (Comment) Care giver support system in place?: Yes (comment)   Criminal Activity/Legal Involvement Pertinent to Current Situation/Hospitalization: No - Comment as needed  Activities  of Daily Living      Permission Sought/Granted Permission sought to share information with : Facility Industrial/product designer granted to share information with : Yes, Verbal Permission Granted     Permission granted to share info w AGENCY: home health        Emotional Assessment Appearance:: Appears older than stated age Attitude/Demeanor/Rapport: Engaged Affect (typically observed): Accepting Orientation: : Oriented to Self, Oriented to Place, Oriented to  Time Alcohol / Substance Use: Not Applicable Psych Involvement: No (comment)  Admission diagnosis:  Cholecystitis [K81.9] RUQ pain [R10.11] Upper abdominal pain [R10.10] Sepsis (HCC) [A41.9] Patient Active Problem List   Diagnosis Date Noted   Cholecystitis 09/24/2021   RUQ pain    Gastritis 07/01/2020   Abnormal CXR 08/01/2018   COPD (chronic obstructive pulmonary disease) (HCC) 08/01/2018   OA (osteoarthritis) of knee 11/11/2017   Myelopathy (HCC) 07/11/2017   Essential hypertension 04/30/2017   Diabetes mellitus type 2 in nonobese (HCC) 04/30/2017   Hyperlipidemia 04/30/2017   Right shoulder pain 04/30/2017   PCP:  Kaleen Mask, MD Pharmacy:   [facility] - [place] - [address] [address] [address] [address] [place] [address] Phone: [phone] Fax: [phone]  [facility] [address] [place] [address] Phone: [phone] Fax: [phone]     Social Determinants of Health (SDOH) Interventions    Readmission Risk Interventions No flowsheet data found.

## 2021-09-27 NOTE — Progress Notes (Signed)
Echocardiogram 2D Echocardiogram has been performed.  Warren Lacy Alexie Samson RDCS 09/27/2021, 1:50 PM

## 2021-09-28 ENCOUNTER — Inpatient Hospital Stay (HOSPITAL_COMMUNITY): Payer: Medicare Other | Admitting: Certified Registered Nurse Anesthetist

## 2021-09-28 ENCOUNTER — Encounter (HOSPITAL_COMMUNITY)
Admission: EM | Disposition: A | Discharge status: Home Health | Payer: Self-pay | Source: Home / Self Care | Attending: Family Medicine

## 2021-09-28 ENCOUNTER — Inpatient Hospital Stay (HOSPITAL_COMMUNITY): Payer: Medicare Other

## 2021-09-28 ENCOUNTER — Encounter (HOSPITAL_COMMUNITY): Payer: Self-pay | Admitting: Family Medicine

## 2021-09-28 DIAGNOSIS — R0902 Hypoxemia: Secondary | ICD-10-CM | POA: Diagnosis not present

## 2021-09-28 DIAGNOSIS — R931 Abnormal findings on diagnostic imaging of heart and coronary circulation: Secondary | ICD-10-CM

## 2021-09-28 DIAGNOSIS — I82539 Chronic embolism and thrombosis of unspecified popliteal vein: Secondary | ICD-10-CM

## 2021-09-28 HISTORY — PX: TEE WITHOUT CARDIOVERSION: SHX5443

## 2021-09-28 LAB — CBC WITH DIFFERENTIAL/PLATELET
Abs Immature Granulocytes: 0.69 10*3/uL — ABNORMAL HIGH (ref 0.00–0.07)
Basophils Absolute: 0.1 10*3/uL (ref 0.0–0.1)
Basophils Relative: 0 %
Eosinophils Absolute: 0 10*3/uL (ref 0.0–0.5)
Eosinophils Relative: 0 %
HCT: 40.2 % (ref 39.0–52.0)
Hemoglobin: 13.2 g/dL (ref 13.0–17.0)
Immature Granulocytes: 4 %
Lymphocytes Relative: 5 %
Lymphs Abs: 0.9 10*3/uL (ref 0.7–4.0)
MCH: 31.1 pg (ref 26.0–34.0)
MCHC: 32.8 g/dL (ref 30.0–36.0)
MCV: 94.8 fL (ref 80.0–100.0)
Monocytes Absolute: 1.6 10*3/uL — ABNORMAL HIGH (ref 0.1–1.0)
Monocytes Relative: 8 %
Neutro Abs: 15.5 10*3/uL — ABNORMAL HIGH (ref 1.7–7.7)
Neutrophils Relative %: 83 %
Platelets: 186 10*3/uL (ref 150–400)
RBC: 4.24 MIL/uL (ref 4.22–5.81)
RDW: 13.6 % (ref 11.5–15.5)
WBC: 18.8 10*3/uL — ABNORMAL HIGH (ref 4.0–10.5)
nRBC: 0 % (ref 0.0–0.2)

## 2021-09-28 LAB — GLUCOSE, CAPILLARY
Glucose-Capillary: 80 mg/dL (ref 70–99)
Glucose-Capillary: 79 mg/dL (ref 70–99)
Glucose-Capillary: 75 mg/dL (ref 70–99)
Glucose-Capillary: 91 mg/dL (ref 70–99)

## 2021-09-28 LAB — BASIC METABOLIC PANEL
Anion gap: 10 (ref 5–15)
BUN: 21 mg/dL (ref 8–23)
CO2: 26 mmol/L (ref 22–32)
Calcium: 8.6 mg/dL — ABNORMAL LOW (ref 8.9–10.3)
Chloride: 99 mmol/L (ref 98–111)
Creatinine, Ser: 1.35 mg/dL — ABNORMAL HIGH (ref 0.61–1.24)
GFR, Estimated: 52 mL/min — ABNORMAL LOW (ref >60)
Glucose, Bld: 94 mg/dL (ref 70–99)
Potassium: 4.3 mmol/L (ref 3.5–5.1)
Sodium: 135 mmol/L (ref 135–145)

## 2021-09-28 LAB — ECHO TEE
AV Mean grad: 6 mmHg
AV Peak grad: 13.4 mmHg
Ao pk vel: 1.83 m/s

## 2021-09-28 LAB — CULTURE, BLOOD (ROUTINE X 2)
Culture: NO GROWTH
Culture: NO GROWTH
Special Requests: ADEQUATE
Special Requests: ADEQUATE

## 2021-09-28 LAB — HEPARIN LEVEL (UNFRACTIONATED)
Heparin Unfractionated: 0.39 IU/mL (ref 0.30–0.70)
Heparin Unfractionated: 0.33 IU/mL (ref 0.30–0.70)

## 2021-09-28 SURGERY — ECHOCARDIOGRAM, TRANSESOPHAGEAL
Anesthesia: Monitor Anesthesia Care

## 2021-09-28 MED ORDER — LIDOCAINE 2% (20 MG/ML) 5 ML SYRINGE
INTRAMUSCULAR | Status: DC | PRN
  Administered 2021-09-28: 100 mg via INTRAVENOUS

## 2021-09-28 MED ORDER — DEXTROSE 50 % IV SOLN: 1.0000 | Freq: Once | INTRAVENOUS | Status: DC

## 2021-09-28 MED ORDER — IPRATROPIUM-ALBUTEROL 0.5-2.5 (3) MG/3ML IN SOLN: 3.0000 mL | Freq: Four times a day (QID) | RESPIRATORY_TRACT | Status: DC | PRN

## 2021-09-28 MED ORDER — SODIUM CHLORIDE 0.9 % IV SOLN: INTRAVENOUS | Status: DC

## 2021-09-28 MED ORDER — DEXTROSE 50 % IV SOLN: 1.0000 | INTRAVENOUS | Status: DC | PRN

## 2021-09-28 MED ORDER — HYDROMORPHONE HCL 1 MG/ML IJ SOLN: 0.2500 mg | INTRAMUSCULAR | Status: DC | PRN

## 2021-09-28 MED ORDER — TRAMADOL HCL 50 MG PO TABS
50.0000 mg | ORAL_TABLET | Freq: Four times a day (QID) | ORAL | Status: DC | PRN
  Administered 2021-09-28 – 2021-09-29 (×3): 50 mg via ORAL
  Filled 2021-09-28 (×3): qty 1

## 2021-09-28 MED ORDER — PROPOFOL 10 MG/ML IV BOLUS
INTRAVENOUS | Status: DC | PRN
  Administered 2021-09-28 (×2): 10 mg via INTRAVENOUS

## 2021-09-28 MED ORDER — PROPOFOL 500 MG/50ML IV EMUL
INTRAVENOUS | Status: DC | PRN
  Administered 2021-09-28: 75 ug/kg/min via INTRAVENOUS

## 2021-09-28 MED ORDER — OXYCODONE HCL 5 MG PO TABS: 5.0000 mg | ORAL_TABLET | Freq: Once | ORAL | Status: DC | PRN

## 2021-09-28 MED ORDER — OXYCODONE HCL 5 MG/5ML PO SOLN: 5.0000 mg | Freq: Once | ORAL | Status: DC | PRN

## 2021-09-28 MED ORDER — PROMETHAZINE HCL 25 MG/ML IJ SOLN: 6.2500 mg | INTRAMUSCULAR | Status: DC | PRN

## 2021-09-28 MED ORDER — DOXYCYCLINE HYCLATE 100 MG PO TABS
100.0000 mg | ORAL_TABLET | Freq: Two times a day (BID) | ORAL | Status: DC
  Administered 2021-09-28 – 2021-09-29 (×3): 100 mg via ORAL
  Filled 2021-09-28 (×3): qty 1

## 2021-09-28 NOTE — Progress Notes (Addendum)
FPTS Brief Progress Note: Late Entry  S: RN reports pt was agitated earlier tonight. No family at bedside tonight.   O: BP 114/67 (BP Location: Left Arm)   Pulse 89   Temp 99 F (37.2 C) (Oral)   Resp (!) 22   Ht 6' (1.829 m)   Wt 96.2 kg   SpO2 94%   BMI 28.75 kg/m    GEN: resting comfortably  A/P: DVT Earlier tonight started Heparin without bolus for LLE chronic DVT seen on LE Doppler.   Acute hypoxic respiratory failure CTA showing emphysema and possible pneumonia.  Does have leukocytosis.  Patient already receiving Augmentin.  No PE.  Stable on 2 L. -Follow-up a.m. CBC  Agitation Delirium/agitation without use of opioids tonight.  Currently patient resting comfortably. - Orders reviewed. Labs for AM ordered, which was adjusted as needed.    Katha Cabal, DO 09/28/2021, 2:50 AM PGY-3, [facility] Night Resident  Please page [phone] with questions.

## 2021-09-28 NOTE — CV Procedure (Signed)
Transesophageal echocardiogram (TEE) : Preliminary report  TEE was performed without complications   LV: Normal EF. RV: Grossly normal structure and function.  LA: Grossly normal.  Spontaneous echo contrast was not present.  No thrombus present. Left atrial appendage: Spontaneous echo contrast was not present.  No thrombus present. Inter atrial septum is intact without defect based on color doppler. Lipomatous intra-atrial septum.  RA: Grossly normal.     MV: mild regurgitation,no stenosis, no vegetation noted.  TV: trace regurgitation,no stenosis, no vegetation noted. AV: no regurgitation,no stenosis, no vegetation noted.   PV: trivial regurgitation,no stenosis, no vegetation noted.   Thoracic and ascending aorta Mild plaque.   Final report forthcoming.  Tessa Lerner, [place], [facility]  Pager: [phone] Office: [phone]

## 2021-09-28 NOTE — Progress Notes (Signed)
FMTS Brief Note In to see patient with Dr. Yetta Barre. Discussed preliminary echocardiogram results, ongoing potential sources for infection, overall clinical status. Family would like to discuss anticoagulation. Goal is to still have patient go home. Will continue heparin at this time, discussed risks/benefits again with family.   Family is concerned about abdominal pain. Patient asleep during conversation. Easy to awaken. Knows his name. Endorses some abdominal pain. Abdomen mildly distended but very soft. He had rebound or guarding. No TTP in LLQ, LUQ. Mild tenderness in RUQ. No signs of pain during exam.  Patient asking for food.  - Will advance diet - Delirium precautions, will have patient sit up in bed with family, blinds open now.  - Serial abdominal exams. If any changes, recommend reaching out to General Surgery and CT Abd/Pelvis.  - Will discuss anticoagulation and palliative care 10/21.    All questions answered.   Terisa Starr, MD  Family Medicine Teaching Service

## 2021-09-28 NOTE — Interval H&P Note (Signed)
History and Physical Interval Note:  09/28/2021 2:01 PM  Joshua [name]  has presented today for surgery, with the diagnosis of endocarditis.  The various methods of treatment have been discussed with the patient and family. After consideration of risks, benefits and other options for treatment, the patient has consented to  Procedure(s): TRANSESOPHAGEAL ECHOCARDIOGRAM (TEE) (N/A) as a surgical intervention.  The patient's history has been reviewed, patient examined, no change in status, stable for surgery.  I have reviewed the patient's chart and labs.  Questions were answered to the patient's, wife, and her daughters's satisfaction.     Taniya Dasher [employer]

## 2021-09-28 NOTE — Progress Notes (Addendum)
ANTICOAGULATION CONSULT NOTE - Consult  Pharmacy Consult for heparin Indication: DVT  No Known Allergies  Patient Measurements: Height: 6' (182.9 cm) Weight: 96.2 kg (212 lb 1.3 oz) IBW/kg (Calculated) : 77.6 Heparin Dosing Weight: 96kg  Vital Signs: Temp: 97.4 F (36.3 C) (10/20 1447) Temp Source: Temporal (10/20 1447) BP: 125/59 (10/20 1506) Pulse Rate: 94 (10/20 1506)  Labs: Recent Labs    09/26/21 0142 09/27/21 0121 09/27/21 1632 09/28/21 0559 09/28/21 1121  HGB 13.0  --  13.1 13.2  --   HCT 39.5  --  40.2 40.2  --   PLT PLATELET CLUMPS NOTED ON SMEAR, UNABLE TO ESTIMATE  --  PLATELET CLUMPS NOTED ON SMEAR, UNABLE TO ESTIMATE 186  --   HEPARINUNFRC  --   --   --  0.39 0.33  CREATININE 1.68* 1.46*  --  1.35*  --      Estimated Creatinine Clearance: 49.8 mL/min (A) (by C-G formula based on SCr of 1.35 mg/dL (H)).   Medical History: Past Medical History:  Diagnosis Date   Anemia    hx.   Arthritis    COPD (chronic obstructive pulmonary disease) (HCC)    reports at one poin he had copd but he has stopped smoking and now doesnt have any trouble breathing or  need an inhaler    Diabetes mellitus without complication (HCC)    type 2   Headache    none recently    High cholesterol    History of kidney stones    HOH (hard of hearing)    both ears   Hypertension    Pneumonia    years ago    Assessment: 62 yoM admitted w/ abdominal pain s/p lap chole on 10/16. Pt noted on ECHO to have possible thrombus vs vegetation. CT chest negative for PE but dopplers positive for chronic L DVT. Pharmacy asked to start heparin with no bolus.  Heparin level therapeutic at 0.33 but trending down. Will empirically increase the dose to target middle range of goal heparin level. Hb stable at 13.2 and platelets 186K. No bleeding or complications noted.   Goal of Therapy:  Heparin level 0.3-0.7 units/ml Monitor platelets by anticoagulation protocol: Yes   Plan:  Increase IV  heparin to 1,550 units/hr.  Recheck heparin level with am labs.  Continue to monitor H&H and platelets.   Larena Sox, PharmD Candidate  09/28/2021 3:31 PM   I discussed / reviewed the pharmacy note by Ms. Jean Rosenthal and I agree with the student's findings and plans as documented.  [employer], Pharm.D., BCPS Clinical Pharmacist  **Pharmacist phone directory can be found on [email or web address] listed under [facility].  09/28/2021 4:01 PM

## 2021-09-28 NOTE — Anesthesia Preprocedure Evaluation (Signed)
Anesthesia Evaluation  Patient identified by MRN, date of birth, ID band Patient awake    Reviewed: Allergy & Precautions, NPO status , Patient's Chart, lab work & pertinent test results  History of Anesthesia Complications Negative for: history of anesthetic complications  Airway Mallampati: I  TM Distance: >3 FB Neck ROM: Full    Dental  (+) Edentulous Upper, Partial Lower, Dental Advisory Given   Pulmonary COPD, former smoker,    Pulmonary exam normal        Cardiovascular hypertension, Pt. on medications and Pt. on home beta blockers Normal cardiovascular exam     Neuro/Psych  Headaches, negative psych ROS   GI/Hepatic Neg liver ROS,   Endo/Other  negative endocrine ROSdiabetes, Oral Hypoglycemic Agents  Renal/GU negative Renal ROS     Musculoskeletal  (+) Arthritis , Osteoarthritis,    Abdominal   Peds  Hematology HLD   Anesthesia Other Findings   Reproductive/Obstetrics                             Anesthesia Physical  Anesthesia Plan  ASA: 3  Anesthesia Plan: MAC   Post-op Pain Management:    Induction: Intravenous  PONV Risk Score and Plan: 1 and Ondansetron and Treatment may vary due to age or medical condition  Airway Management Planned: Nasal Cannula  Additional Equipment:   Intra-op Plan:   Post-operative Plan:   Informed Consent: I have reviewed the patients History and Physical, chart, labs and discussed the procedure including the risks, benefits and alternatives for the proposed anesthesia with the patient or authorized representative who has indicated his/her understanding and acceptance.     Dental advisory given  Plan Discussed with: Anesthesiologist and CRNA  Anesthesia Plan Comments:         Anesthesia Quick Evaluation

## 2021-09-28 NOTE — Progress Notes (Signed)
Family Medicine Teaching Service Daily Progress Note Intern Pager: [phone]  Patient name: Joshua [name] Medical record number: [id number] Date of birth: Apr 30, 1936 Age: 85 y.o. Gender: male  Primary Care Provider: Kaleen Mask, MD Consultants: General surgery Code Status: Full  Pt Overview and Major Events to Date:  09/23/2021-admitted 09/24/2021-cholecystectomy  Assessment and Plan: Tay Whitwell. Rylander is an 85 year old male with past medical history of type 2 diabetes, COPD, HLD, hypertension admitted for cholecystitis and is s/p cholecystectomy  Acute gangrenous cholecystitis s/p cholecystectomy POD #4.  Complaining of diffuse abdominal pain, appears to be mildly tender upon palpation.  Abdomen is soft and nondistended.  Surgery saw patient this morning and has cleared him from a surgical standpoint.  Surgery is signing off today. -Tylenol 1000 mg every 6 hours scheduled -Tramadol 50 mg every 6 hours as needed per surgery -Augmentin 875-125 mg 1 tablet twice daily -PT eval and treat  Acute versus chronic respiratory failure   COPD Patient currently on 2 L of oxygen via [place] with oxygen saturations in the mid 90s with no shortness of breath.  Patient had echo done yesterday to rule out congestive heart failure as source of his oxygen requirement.  Echo showed an EF of 65-70% and also showed findings concerning for thrombus versus vegetation in right ventricle. WBC 18.8 from 20.1. CTA chest yesterday showed atelectasis vs pneumonia, will extend antibiotic coverage.  Patient has been afebrile however has also been receiving Tylenol. -Keep O2 saturation 88-92% -Incentive spirometry -DuoNeb twice daily -Follow-up with primary care regarding COPD management -Extend Augmentin through tomorrow to cover for possible CAP  Thrombus versus vegetation in right ventricle  Chronic DVT Echo from yesterday showed findings concerning for thrombus versus vegetation right ventricle.  Per  cardiology patient had a CTA chest yesterday which showed no PE, small bilateral pleural effusions, airspace opacities in lower lobes and right middle lobe which could reflect atelectasis or pneumonia.  As patient has remained afebrile, and lungs sound clear I am not concerned that pneumonia at this time.  Patient is also finishing a course of Augmentin s/p cholecystectomy which could treat possible CAP.  Patient also received lower extremity ultrasound to rule out DVT.  Patient was found to have chronic DVT involving left popliteal vein.  Patient was started on heparin drip last night for this.  Patient is scheduled to have TEE today. -TEE today -AM CBC -Heparin level daily   Chronic low back pain Patient did not complain of low back pain this morning.  He is currently getting gabapentin, Tylenol, and as needed tramadol for pain. -Continue gabapentin 100 mg 3 times daily -Continue Tylenol 1000 mg every 6 hours -Continue tramadol 50 mg every 6 hours as needed -Voltaren gel as needed  Type 2 diabetes Glucose today is 80.  Hemoglobin A1c of 7.2 on 09/25/2021.  Patient was started on Jardiance on 09/26/2021. -We will hold Jardiance today due to blood glucose of 80 -CBG this afternoon  Hypokalemia Patient potassium 3.3 yesterday and received 2 doses of Potassium  40 mEq.  Potassium this morning is 4.3  AKI Resolving.  Creatinine of 1.35 this morning, down from 1.46 yesterday.  Was 1.96 on 09/25/2021 after receiving contrast for CT.  HTN BP this morning is 111/61 -Continue to hold home amlodipine 10 mg daily  FEN/GI: NPO PPx: Heparin Dispo:Home pending clinical improvement . Barriers include continued medical management  Subjective:  Patient is fairly somnolent upon exam.  Has worsening diffuse abdominal pain.  His daughter  states he had a bowel movement yesterday.  Denies shortness of breath  Objective: Temp:  [97.6 F (36.4 C)-99.8 F (37.7 C)] 99.4 F (37.4 C) (10/20 0809) Pulse  Rate:  [89-99] 98 (10/20 0809) Resp:  [19-23] 19 (10/20 0809) BP: (111-159)/(61-78) 111/61 (10/20 0809) SpO2:  [91 %-97 %] 96 % (10/20 0809) Physical Exam: General: Patient lying in bed, somnolent, appears to be uncomfortable Cardiovascular: RRR Respiratory: CTAB, normal work of breathing Abdomen: Soft, nondistended, diffuse tenderness to palpation, port sites from cholecystectomy are healing well with bandage on the most lateral site. Extremities: No edema BLEs  Laboratory: Recent Labs  Lab 09/26/21 0142 09/27/21 1632 09/28/21 0559  WBC 20.6* 20.1* 18.8*  HGB 13.0 13.1 13.2  HCT 39.5 40.2 40.2  PLT PLATELET CLUMPS NOTED ON SMEAR, UNABLE TO ESTIMATE PLATELET CLUMPS NOTED ON SMEAR, UNABLE TO ESTIMATE 186   Recent Labs  Lab 09/23/21 2027 09/24/21 0254 09/25/21 0928 09/25/21 2223 09/26/21 0142 09/27/21 0121 09/28/21 0559  NA 140 141 137   < > 134* 130* 135  K 3.9 4.2 4.1   < > 4.0 3.3* 4.3  CL 101 105 100   < > 100 95* 99  CO2 27 25 25    < > 25 25 26   BUN 17 19 31*   < > 30* 23 21  CREATININE 1.36* 1.42* 1.96*   < > 1.68* 1.46* 1.35*  CALCIUM 9.0 8.5* 8.4*   < > 8.3* 8.1* 8.6*  PROT 6.8 6.1* 5.7*  --   --   --   --   BILITOT 0.9 0.9 1.1  --   --   --   --   ALKPHOS 62 46 35*  --   --   --   --   ALT 54* 40 39  --   --   --   --   AST 46* 32 42*  --   --   --   --   GLUCOSE 231* 148* 129*   < > 104* 98 94   < > = values in this interval not displayed.     , DO 09/28/2021, 8:57 AM PGY-1, [facility] FPTS Intern pager: [phone], text pages welcome

## 2021-09-28 NOTE — Progress Notes (Signed)
4 Days Post-Op  Subjective: Agitated this morning.  C/o some pain this morning.  Passed flatus and had a BM yesterday.  Objective: Vital signs in last 24 hours: Temp:  [97.6 F (36.4 C)-99.8 F (37.7 C)] 99.4 F (37.4 C) (10/20 0809) Pulse Rate:  [89-99] 98 (10/20 0809) Resp:  [19-23] 19 (10/20 0809) BP: (111-159)/(61-78) 111/61 (10/20 0809) SpO2:  [91 %-97 %] 96 % (10/20 0809) Last BM Date: 09/27/21  Intake/Output from previous day: 10/19 0701 - 10/20 0700 In: 628 [P.O.:520; I.V.:108] Out: 1000 [Urine:1000] Intake/Output this shift: No intake/output data recorded.  PE: Abd: soft, minimally tender, +BS, ND, incisions c/d/I, except most lateral covered with gauze.  Lab Results:  Recent Labs    09/27/21 1632 09/28/21 0559  WBC 20.1* 18.8*  HGB 13.1 13.2  HCT 40.2 40.2  PLT PLATELET CLUMPS NOTED ON SMEAR, UNABLE TO ESTIMATE 186   BMET Recent Labs    09/27/21 0121 09/28/21 0559  NA 130* 135  K 3.3* 4.3  CL 95* 99  CO2 25 26  GLUCOSE 98 94  BUN 23 21  CREATININE 1.46* 1.35*  CALCIUM 8.1* 8.6*   PT/INR No results for input(s): LABPROT, INR in the last 72 hours.  CMP     Component Value Date/Time   NA 135 09/28/2021 0559   K 4.3 09/28/2021 0559   CL 99 09/28/2021 0559   CO2 26 09/28/2021 0559   GLUCOSE 94 09/28/2021 0559   BUN 21 09/28/2021 0559   CREATININE 1.35 (H) 09/28/2021 0559   CALCIUM 8.6 (L) 09/28/2021 0559   PROT 5.7 (L) 09/25/2021 0928   ALBUMIN 2.4 (L) 09/25/2021 0928   AST 42 (H) 09/25/2021 0928   ALT 39 09/25/2021 0928   ALKPHOS 35 (L) 09/25/2021 0928   BILITOT 1.1 09/25/2021 0928   GFRNONAA 52 (L) 09/28/2021 0559   GFRAA 44 (L) 07/02/2020 0816   Lipase     Component Value Date/Time   LIPASE 415 (H) 09/24/2021 0254       Studies/Results: CT Angio Chest Pulmonary Embolism (PE) W or WO Contrast  Result Date: 09/27/2021 CLINICAL DATA:  PE suspected, high prob EXAM: CT ANGIOGRAPHY CHEST WITH CONTRAST TECHNIQUE:  Multidetector CT imaging of the chest was performed using the standard protocol during bolus administration of intravenous contrast. Multiplanar CT image reconstructions and MIPs were obtained to evaluate the vascular anatomy. CONTRAST:  27mL OMNIPAQUE IOHEXOL 350 MG/ML SOLN COMPARISON:  06/01/2020 FINDINGS: Cardiovascular: No filling defects in the pulmonary arteries to suggest pulmonary emboli. Cardiomegaly. Advanced multi vessel coronary artery disease and aortic atherosclerosis. No aneurysm. Mediastinum/Nodes: No mediastinal, hilar, or axillary adenopathy. Trachea and esophagus are unremarkable. Thyroid unremarkable. Lungs/Pleura: Mild centrilobular emphysema. Small bilateral pleural effusions. Bilateral lower lobe and right middle lobe airspace disease could reflect atelectasis or pneumonia. Upper Abdomen: Imaging into the upper abdomen demonstrates no acute findings. Musculoskeletal: Chest wall soft tissues are unremarkable. No acute bony abnormality. Review of the MIP images confirms the above findings. IMPRESSION: No evidence of pulmonary embolus. Small bilateral pleural effusions. Airspace opacities in the lower lobes and right middle lobe could reflect atelectasis or pneumonia. Cardiomegaly, coronary artery disease. Aortic Atherosclerosis (ICD10-I70.0) and Emphysema (ICD10-J43.9). Electronically Signed   By: Charlett Nose M.D.   On: 09/27/2021 19:27   ECHOCARDIOGRAM COMPLETE  Result Date: 09/27/2021    ECHOCARDIOGRAM REPORT   Patient Name:   Joshua [name] Date of Exam: 09/27/2021 Medical Rec #:  [id number]       Height:  72.0 in Accession #:    [id number]      Weight:       212.0 lb Date of Birth:  1936-08-20       BSA:          2.184 m Patient Age:    85 years        BP:           135/62 mmHg Patient Gender: M               HR:           104 bpm. Exam Location:  Inpatient Procedure: 2D Echo, Color Doppler and Cardiac Doppler Indications:    R06.9 DOE  History:        Patient has no prior history  of Echocardiogram examinations.                 COPD; Risk Factors:Hypertension, Diabetes and Dyslipidemia.  Sonographer:    Irving Burton Senior RDCS Referring Phys: [id number] MARSHALL L CHAMBLISS  Sonographer Comments: Technically difficult due to COPD IMPRESSIONS  1. Left ventricular ejection fraction, by estimation, is 65 to 70%. The left ventricle has normal function. The left ventricle has no regional wall motion abnormalities. There is mild concentric left ventricular hypertrophy. Left ventricular diastolic parameters are consistent with Grade I diastolic dysfunction (impaired relaxation).  2. On short axis view (frame 16) there is a higly mobile echolucent mass ( 1.14 cm x 0.99 cm) which is concerning for a thrombus in transit vs vegetation. Unclear if this attaches to the RV wall due to poor visibility. Right ventricular systolic function was not well visualized. The right ventricular size is normal. Tricuspid regurgitation signal is inadequate for assessing PA pressure.  3. Left atrial size was mildly dilated.  4. The mitral valve is normal in structure. No evidence of mitral valve regurgitation. No evidence of mitral stenosis.  5. The aortic valve is tricuspid. Aortic valve regurgitation is not visualized. No aortic stenosis is present.  6. The inferior vena cava is normal in size with greater than 50% respiratory variability, suggesting right atrial pressure of 3 mmHg. Conclusion(s)/Recommendation(s): Findings concerning for clot in transit vs vegetaion, would recommend Transesophageal Echocardiogram for clarification. FINDINGS  Left Ventricle: Left ventricular ejection fraction, by estimation, is 65 to 70%. The left ventricle has normal function. The left ventricle has no regional wall motion abnormalities. The left ventricular internal cavity size was normal in size. There is  mild concentric left ventricular hypertrophy. Left ventricular diastolic parameters are consistent with Grade I diastolic dysfunction  (impaired relaxation). Right Ventricle: On short axis view (frame 16) there is a higly mobile echolucent mass ( 1.14 cm x 0.99 cm) which is concerning for a thrombus in transit vs vegetation. Unclear if this attaches to the RV wall due to poor visibility. The right ventricular  size is normal. No increase in right ventricular wall thickness. Right ventricular systolic function was not well visualized. Tricuspid regurgitation signal is inadequate for assessing PA pressure. Left Atrium: Left atrial size was mildly dilated. Right Atrium: Right atrial size was not well visualized. Pericardium: There is no evidence of pericardial effusion. Mitral Valve: The mitral valve is normal in structure. No evidence of mitral valve regurgitation. No evidence of mitral valve stenosis. Tricuspid Valve: The tricuspid valve is normal in structure. Tricuspid valve regurgitation is not demonstrated. No evidence of tricuspid stenosis. Aortic Valve: The aortic valve is tricuspid. Aortic valve regurgitation is not visualized. No aortic stenosis is present. Pulmonic  Valve: The pulmonic valve was normal in structure. Pulmonic valve regurgitation is not visualized. No evidence of pulmonic stenosis. Aorta: The aortic root is normal in size and structure. Venous: The inferior vena cava is normal in size with greater than 50% respiratory variability, suggesting right atrial pressure of 3 mmHg. IAS/Shunts: No atrial level shunt detected by color flow Doppler.  LEFT VENTRICLE PLAX 2D LVIDd:         4.60 cm   Diastology LVIDs:         2.90 cm   LV e' medial:    5.33 cm/s LV PW:         1.00 cm   LV E/e' medial:  9.7 LV IVS:        1.20 cm   LV e' lateral:   8.38 cm/s LVOT diam:     1.90 cm   LV E/e' lateral: 6.2 LV SV:         46 LV SV Index:   21 LVOT Area:     2.84 cm  LEFT ATRIUM           Index LA diam:      3.60 cm 1.65 cm/m LA Vol (A2C): 28.4 ml 13.00 ml/m LA Vol (A4C): 86.4 ml 39.56 ml/m  AORTIC VALVE LVOT Vmax:   84.20 cm/s LVOT Vmean:   61.500 cm/s LVOT VTI:    0.163 m  AORTA Ao Root diam: 3.60 cm Ao Asc diam:  3.40 cm MITRAL VALVE MV Area (PHT): 3.61 cm     SHUNTS MV Decel Time: 210 msec     Systemic VTI:  0.16 m MV E velocity: 51.90 cm/s   Systemic Diam: 1.90 cm MV A velocity: 139.00 cm/s MV E/A ratio:  0.37 Kardie Tobb DO Electronically signed by Thomasene Ripple DO Signature Date/Time: 09/27/2021/2:26:20 PM    Final    VAS [place] LOWER EXTREMITY VENOUS (DVT)  Result Date: 09/27/2021  Lower Venous DVT Study Patient Name:  Joshua [name]  Date of Exam:   09/27/2021 Medical Rec #: [id number]        Accession #:    [id number] Date of Birth: 07-Aug-1936        Patient Gender: M Patient Age:   65 years Exam Location:  [facility] Procedure:      VAS [place] LOWER EXTREMITY VENOUS (DVT) Referring Phys: MARSHALL CHAMBLISS --------------------------------------------------------------------------------  Indications: SOB, and suspected PE.  Comparison Study: No prior studies. Performing Technologist: Jean Rosenthal RDMS, RVT  Examination Guidelines: A complete evaluation includes B-mode imaging, spectral Doppler, color Doppler, and power Doppler as needed of all accessible portions of each vessel. Bilateral testing is considered an integral part of a complete examination. Limited examinations for reoccurring indications may be performed as noted. The reflux portion of the exam is performed with the patient in reverse Trendelenburg.  +---------+---------------+---------+-----------+----------+--------------+ RIGHT    CompressibilityPhasicitySpontaneityPropertiesThrombus Aging +---------+---------------+---------+-----------+----------+--------------+ CFV      Full           Yes      Yes                                 +---------+---------------+---------+-----------+----------+--------------+ SFJ      Full                                                        +---------+---------------+---------+-----------+----------+--------------+  FV  Prox  Full                                                        +---------+---------------+---------+-----------+----------+--------------+ FV Mid   Full                                                        +---------+---------------+---------+-----------+----------+--------------+ FV DistalFull                                                        +---------+---------------+---------+-----------+----------+--------------+ PFV      Full                                                        +---------+---------------+---------+-----------+----------+--------------+ POP      Full           Yes      Yes                                 +---------+---------------+---------+-----------+----------+--------------+ PTV      Full                                                        +---------+---------------+---------+-----------+----------+--------------+ PERO     Full                                                        +---------+---------------+---------+-----------+----------+--------------+   +---------+---------------+---------+-----------+---------------+-------------+ LEFT     CompressibilityPhasicitySpontaneityProperties     Thrombus                                                                 Aging         +---------+---------------+---------+-----------+---------------+-------------+ CFV      Full           Yes      Yes                                     +---------+---------------+---------+-----------+---------------+-------------+ SFJ      Full                                                            +---------+---------------+---------+-----------+---------------+-------------+  FV Prox  Full                                                            +---------+---------------+---------+-----------+---------------+-------------+ FV Mid   Full                                                             +---------+---------------+---------+-----------+---------------+-------------+ FV DistalFull                                                            +---------+---------------+---------+-----------+---------------+-------------+ PFV      Full                                                            +---------+---------------+---------+-----------+---------------+-------------+ POP      Partial        Yes      Yes        brightly       Chronic                                                   echogenic                    +---------+---------------+---------+-----------+---------------+-------------+ PTV      Full                                                            +---------+---------------+---------+-----------+---------------+-------------+ PERO     Full                                                            +---------+---------------+---------+-----------+---------------+-------------+ Gastroc  Full                                                            +---------+---------------+---------+-----------+---------------+-------------+     Summary: RIGHT: - There is no evidence of deep vein thrombosis in the lower extremity.  - No cystic structure found in the popliteal fossa.  LEFT: - Findings consistent with chronic deep vein  thrombosis involving the left popliteal vein.  - No cystic structure found in the popliteal fossa.  *See table(s) above for measurements and observations. Electronically signed by Gerarda Fraction on 09/27/2021 at 7:47:59 PM.    Final     Anti-infectives: Anti-infectives (From admission, onward)    Start     Dose/Rate Route Frequency Ordered Stop   09/27/21 0000  amoxicillin-clavulanate (AUGMENTIN) 875-125 MG tablet        1 tablet Oral Every 12 hours 09/27/21 1210 09/29/21 2359   09/25/21 1400  amoxicillin-clavulanate (AUGMENTIN) 875-125 MG per tablet 1 tablet        1 tablet Oral Every 12 hours 09/25/21 1125 09/29/21  0959   09/24/21 0800  piperacillin-tazobactam (ZOSYN) IVPB 3.375 g  Status:  Discontinued        3.375 g 12.5 mL/hr over 240 Minutes Intravenous Every 8 hours 09/24/21 0144 09/25/21 1125   09/24/21 0030  piperacillin-tazobactam (ZOSYN) IVPB 3.375 g        3.375 g 100 mL/hr over 30 Minutes Intravenous  Once 09/24/21 0027 09/24/21 0228        Assessment/Plan POD 4, s/p lap chole for gangrenous cholecystitis by Dr. Freida Busman 10/16 -cont to mobilize.  Therapies recommend HH PT/OT -carb mod diet -pain controlled well with Tylenol.  Tramadol prn -WBC down to 18K today, continue abx for 5 days total -surgically stable -follow up with our office being arranged -tramadol sent to pharmacy  -we will sign off at this time as he is surgically stable.  Defer further medical care for medical problems to primary service.  Call if needed for further assistance.   FEN - carb mod VTE - heparin TID ID - zosyn, can treat for a total of 5 days post op.  Can switch to augmentin to complete regimen at discharge  AKI DM COPD HTN Chronic DVT, unclear this needs anticoagulation, but ok from our standpoint if warranted --other medical problems per medicine--   LOS: 4 days    Letha Cape , [facility] Surgery 09/28/2021, 8:40 AM Please see Amion for pager number during day hours 7:00am-4:30pm or 7:00am -11:30am on weekends

## 2021-09-28 NOTE — Progress Notes (Signed)
  Echocardiogram Echocardiogram Transesophageal has been performed.  Gerda Diss 09/28/2021, 3:09 PM

## 2021-09-28 NOTE — Transfer of Care (Signed)
Immediate Anesthesia Transfer of Care Note  Patient: Joshua [name]  Procedure(s) Performed: TRANSESOPHAGEAL ECHOCARDIOGRAM (TEE)  Patient Location: Endoscopy Unit  Anesthesia Type:MAC  Level of Consciousness: awake and confused  Airway & Oxygen Therapy: Patient Spontanous Breathing and Patient connected to nasal cannula oxygen  Post-op Assessment: Report given to RN and Post -op Vital signs reviewed and stable  Post vital signs: Reviewed and stable  Last Vitals:  Vitals Value Taken Time  BP 113/56 09/28/21 1446  Temp    Pulse 96 09/28/21 1446  Resp 23 09/28/21 1446  SpO2 93 % 09/28/21 1446  Vitals shown include unvalidated device data.  Last Pain:  Vitals:   09/28/21 1336  TempSrc: Temporal  PainSc: 0-No pain      Patients Stated Pain Goal: 3 (37/94/32 7614)  Complications: No notable events documented.

## 2021-09-28 NOTE — Progress Notes (Addendum)
ANTICOAGULATION CONSULT NOTE - Consult  Pharmacy Consult for heparin Indication: DVT  No Known Allergies  Patient Measurements: Height: 6' (182.9 cm) Weight: 96.2 kg (212 lb) IBW/kg (Calculated) : 77.6 Heparin Dosing Weight: 96kg  Vital Signs: Temp: 99.4 F (37.4 C) (10/20 0809) Temp Source: Oral (10/20 0809) BP: 111/61 (10/20 0809) Pulse Rate: 98 (10/20 0809)  Labs: Recent Labs    09/26/21 0142 09/27/21 0121 09/27/21 1632 09/28/21 0559  HGB 13.0  --  13.1 13.2  HCT 39.5  --  40.2 40.2  PLT PLATELET CLUMPS NOTED ON SMEAR, UNABLE TO ESTIMATE  --  PLATELET CLUMPS NOTED ON SMEAR, UNABLE TO ESTIMATE 186  HEPARINUNFRC  --   --   --  0.39  CREATININE 1.68* 1.46*  --  1.35*     Estimated Creatinine Clearance: 49.8 mL/min (A) (by C-G formula based on SCr of 1.35 mg/dL (H)).   Medical History: Past Medical History:  Diagnosis Date   Anemia    hx.   Arthritis    COPD (chronic obstructive pulmonary disease) (HCC)    reports at one poin he had copd but he has stopped smoking and now doesnt have any trouble breathing or  need an inhaler    Diabetes mellitus without complication (HCC)    type 2   Headache    none recently    High cholesterol    History of kidney stones    HOH (hard of hearing)    both ears   Hypertension    Pneumonia    years ago    Assessment: 39 yoM admitted w/ abdominal pain s/p lap chole on 10/16. Pt noted on ECHO to have possible thrombus vs vegetation. CT chest negative for PE but dopplers positive for chronic L DVT. Pharmacy asked to start heparin with no bolus.  Heparin level therapeutic 0.39. Hb stable at 13.2 and platelets 186K. No bleeding or complications noted.   Goal of Therapy:  Heparin level 0.3-0.7 units/ml Monitor platelets by anticoagulation protocol: Yes   Plan:  Continue IV heparin 1450 units/hr.  Check heparin level in 6 hours to confirm if still therapeutic.  Continue to monitor H&H and platelets.   Larena Sox,  PharmD Candidate  09/28/2021 9:51 AM  Please check AMION for all [facility] numbers 09/28/2021

## 2021-09-29 ENCOUNTER — Other Ambulatory Visit (HOSPITAL_COMMUNITY): Payer: Self-pay

## 2021-09-29 ENCOUNTER — Encounter (HOSPITAL_COMMUNITY): Payer: Self-pay | Admitting: Cardiology

## 2021-09-29 DIAGNOSIS — I82439 Acute embolism and thrombosis of unspecified popliteal vein: Secondary | ICD-10-CM

## 2021-09-29 DIAGNOSIS — J159 Unspecified bacterial pneumonia: Secondary | ICD-10-CM

## 2021-09-29 DIAGNOSIS — E119 Type 2 diabetes mellitus without complications: Secondary | ICD-10-CM | POA: Diagnosis not present

## 2021-09-29 DIAGNOSIS — K819 Cholecystitis, unspecified: Secondary | ICD-10-CM | POA: Diagnosis not present

## 2021-09-29 DIAGNOSIS — Z9181 History of falling: Secondary | ICD-10-CM | POA: Diagnosis not present

## 2021-09-29 LAB — GLUCOSE, CAPILLARY
Glucose-Capillary: 78 mg/dL (ref 70–99)
Glucose-Capillary: 81 mg/dL (ref 70–99)

## 2021-09-29 LAB — CBC
HCT: 38.3 % — ABNORMAL LOW (ref 39.0–52.0)
Hemoglobin: 12.4 g/dL — ABNORMAL LOW (ref 13.0–17.0)
MCH: 31 pg (ref 26.0–34.0)
MCHC: 32.4 g/dL (ref 30.0–36.0)
MCV: 95.8 fL (ref 80.0–100.0)
Platelets: 161 10*3/uL (ref 150–400)
RBC: 4 MIL/uL — ABNORMAL LOW (ref 4.22–5.81)
RDW: 13.6 % (ref 11.5–15.5)
WBC: 17.1 10*3/uL — ABNORMAL HIGH (ref 4.0–10.5)
nRBC: 0.1 % (ref 0.0–0.2)

## 2021-09-29 LAB — HEPARIN LEVEL (UNFRACTIONATED): Heparin Unfractionated: 0.37 IU/mL (ref 0.30–0.70)

## 2021-09-29 MED ORDER — ACETAMINOPHEN 500 MG PO TABS
1000.0000 mg | ORAL_TABLET | Freq: Four times a day (QID) | ORAL | Status: DC | PRN
  Administered 2021-09-29: 1000 mg via ORAL
  Filled 2021-09-29: qty 2

## 2021-09-29 MED ORDER — DOXYCYCLINE HYCLATE 100 MG PO TABS
100.0000 mg | ORAL_TABLET | Freq: Two times a day (BID) | ORAL | 0 refills | Status: AC
  Filled 2021-09-29: qty 7, 4d supply, fill #0

## 2021-09-29 MED ORDER — SPIRIVA RESPIMAT 2.5 MCG/ACT IN AERS
2.0000 | INHALATION_SPRAY | Freq: Every day | RESPIRATORY_TRACT | 0 refills | Status: DC
  Filled 2021-09-29: qty 4, 30d supply, fill #0

## 2021-09-29 MED ORDER — DOXYCYCLINE HYCLATE 100 MG PO TABS: 100.0000 mg | ORAL_TABLET | Freq: Two times a day (BID) | ORAL | Status: DC

## 2021-09-29 NOTE — Progress Notes (Signed)
Patient set for discharged to his private residence at beginning of shift. Family member present in room to transport patient.  Patient remains alert and oriented, All discharge instruction completed by day shift Nurse. Patient was assisted to change his hospital gown, connected to O2 tank  @ 2L/min.  Patient left the unit at exactly 7:40pm.

## 2021-09-29 NOTE — Progress Notes (Signed)
Progress Note  Patient Name: Joshua [name] Date of Encounter: 09/29/2021  Attending physician: Westley Chandler, MD Primary care provider: Kaleen Mask, MD Primary Cardiologist:  Consultant:Latona Krichbaum Odis Hollingshead, DO  Subjective: Joshua [name] is a 84 y.o. male who was seen and examined at bedside  Patient is accompanied by his daughter Inetta Fermo and wife [place] Denies any chest pain. No difficulty with swallowing, no hemoptysis, no problems with neck movements Transesophageal echocardiogram results reviewed with the family. Case discussed and reviewed with his nurse.  Objective: Vital Signs in the last 24 hours: Temp:  [98.2 F (36.8 C)-99.4 F (37.4 C)] 99.2 F (37.3 C) (10/21 1143) Pulse Rate:  [92-100] 92 (10/21 1143) Resp:  [20-22] 21 (10/21 1143) BP: (129-147)/(61-78) 135/69 (10/21 1143) SpO2:  [93 %-98 %] 98 % (10/21 1143)  Intake/Output:  Intake/Output Summary (Last 24 hours) at 09/29/2021 1614 Last data filed at 09/29/2021 0900 Gross per 24 hour  Intake 537.56 ml  Output 800 ml  Net -262.44 ml    Net IO Since Admission: -1,001.03 mL [09/29/21 1614]  Weights:  Filed Weights   09/23/21 2256 09/28/21 1336  Weight: 96.2 kg 96.2 kg    Telemetry: Personally reviewed.  Sinus tachycardia  Physical examination: PHYSICAL EXAM: Vitals with BMI 09/29/2021 09/29/2021 09/29/2021  Height - - -  Weight - - -  BMI - - -  Systolic 135 - 136  Diastolic 69 - 70  Pulse 92 100 -    CONSTITUTIONAL: Appears older than stated age, hemodynamically stable, pursed breathing, currently on nasal cannula oxygen. SKIN: Skin is warm and dry. No rash noted. No cyanosis. No pallor. No jaundice HEAD: Normocephalic and atraumatic.  EYES: No scleral icterus MOUTH/THROAT: Moist oral membranes.  Poor dental hygiene.  Partial dentures. NECK: No JVD present. No thyromegaly noted. No carotid bruits  LYMPHATIC: No visible cervical adenopathy.  CHEST Normal respiratory effort. No intercostal  retractions  LUNGS: Poor air exchange bilaterally, decreased breath sounds, no rhonchi's or rales. CARDIOVASCULAR: Tachycardia, positive S1-S2, no murmurs rubs or gallops appreciated. ABDOMINAL: Soft, nontender, nondistended, positive bowel sounds in all quadrants no apparent ascites.  EXTREMITIES: No peripheral edema HEMATOLOGIC: No significant bruising NEUROLOGIC: Oriented to person, place, and time. Nonfocal. Normal muscle tone.  PSYCHIATRIC: Normal mood and affect. Normal behavior. Cooperative  Lab Results: Hematology Recent Labs  Lab 09/27/21 1632 09/28/21 0559 09/29/21 0117  WBC 20.1* 18.8* 17.1*  RBC 4.21* 4.24 4.00*  HGB 13.1 13.2 12.4*  HCT 40.2 40.2 38.3*  MCV 95.5 94.8 95.8  MCH 31.1 31.1 31.0  MCHC 32.6 32.8 32.4  RDW 13.6 13.6 13.6  PLT PLATELET CLUMPS NOTED ON SMEAR, UNABLE TO ESTIMATE 186 161    Chemistry Recent Labs  Lab 09/23/21 2027 09/24/21 0254 09/25/21 0928 09/25/21 2223 09/26/21 0142 09/27/21 0121 09/28/21 0559  NA 140 141 137   < > 134* 130* 135  K 3.9 4.2 4.1   < > 4.0 3.3* 4.3  CL 101 105 100   < > 100 95* 99  CO2 27 25 25    < > 25 25 26   GLUCOSE 231* 148* 129*   < > 104* 98 94  BUN 17 19 31*   < > 30* 23 21  CREATININE 1.36* 1.42* 1.96*   < > 1.68* 1.46* 1.35*  CALCIUM 9.0 8.5* 8.4*   < > 8.3* 8.1* 8.6*  PROT 6.8 6.1* 5.7*  --   --   --   --   ALBUMIN 3.5 2.9* 2.4*  --   --   --   --  AST 46* 32 42*  --   --   --   --   ALT 54* 40 39  --   --   --   --   ALKPHOS 62 46 35*  --   --   --   --   BILITOT 0.9 0.9 1.1  --   --   --   --   GFRNONAA 52* 49* 33*   < > 40* 47* 52*  ANIONGAP < > < > = values in this interval not displayed.     Cardiac Enzymes: Cardiac Panel (last 3 results) No results for input(s): CKTOTAL, CKMB, TROPONINIHS, RELINDX in the last 72 hours.  BNP (last 3 results) Recent Labs    09/25/21 2223  BNP 106.3*    ProBNP (last 3 results) No results for input(s): PROBNP in the last 8760  hours.   DDimer No results for input(s): DDIMER in the last 168 hours.   Hemoglobin A1c:  Lab Results  Component Value Date   HGBA1C 7.2 (H) 09/25/2021   MPG 159.94 09/25/2021    TSH No results for input(s): TSH in the last 8760 hours.  Lipid Panel No results found for: CHOL, TRIG, HDL, CHOLHDL, VLDL, LDLCALC, LDLDIRECT  Imaging: CT Angio Chest Pulmonary Embolism (PE) W or WO Contrast  Result Date: 09/27/2021 CLINICAL DATA:  PE suspected, high prob EXAM: CT ANGIOGRAPHY CHEST WITH CONTRAST TECHNIQUE: Multidetector CT imaging of the chest was performed using the standard protocol during bolus administration of intravenous contrast. Multiplanar CT image reconstructions and MIPs were obtained to evaluate the vascular anatomy. CONTRAST:  65mL OMNIPAQUE IOHEXOL 350 MG/ML SOLN COMPARISON:  06/01/2020 FINDINGS: Cardiovascular: No filling defects in the pulmonary arteries to suggest pulmonary emboli. Cardiomegaly. Advanced multi vessel coronary artery disease and aortic atherosclerosis. No aneurysm. Mediastinum/Nodes: No mediastinal, hilar, or axillary adenopathy. Trachea and esophagus are unremarkable. Thyroid unremarkable. Lungs/Pleura: Mild centrilobular emphysema. Small bilateral pleural effusions. Bilateral lower lobe and right middle lobe airspace disease could reflect atelectasis or pneumonia. Upper Abdomen: Imaging into the upper abdomen demonstrates no acute findings. Musculoskeletal: Chest wall soft tissues are unremarkable. No acute bony abnormality. Review of the MIP images confirms the above findings. IMPRESSION: No evidence of pulmonary embolus. Small bilateral pleural effusions. Airspace opacities in the lower lobes and right middle lobe could reflect atelectasis or pneumonia. Cardiomegaly, coronary artery disease. Aortic Atherosclerosis (ICD10-I70.0) and Emphysema (ICD10-J43.9). Electronically Signed   By: Charlett Nose M.D.   On: 09/27/2021 19:27   ECHO TEE  Result Date: 09/28/2021     TRANSESOPHOGEAL ECHO REPORT   Patient Name:   Joshua [name] Date of Exam: 09/28/2021 Medical Rec #:  [id number]       Height:       72.0 in Accession #:    [id number]      Weight:       212.1 lb Date of Birth:  [birth year]       BSA:          2.184 m Patient Age:    85 years        BP:           136/71 mmHg Patient Gender: M               HR:           93 bpm. Exam Location:  Inpatient Procedure: Transesophageal Echo, Cardiac Doppler and Color Doppler Indications:  Endocarditis  History:         Patient has prior history of Echocardiogram examinations, most                  recent 09/27/2021. Risk Factors:Hypertension, Diabetes and                  Dyslipidemia.  Sonographer:     Ross Ludwig RDCS (AE) Referring Phys:  [id number] Tessa Lerner Diagnosing Phys: Tessa Lerner DO PROCEDURE: After discussion of the risks and benefits of a TEE, an informed consent was obtained from the patient. The transesophogeal probe was passed without difficulty through the esophogus of the patient. Sedation performed by different physician. The patient was monitored while under deep sedation. Anesthestetic sedation was provided intravenously by Anesthesiology: 183.54mg  of Propofol, 100mg  of Lidocaine. Image quality was good. The patient developed no complications during the procedure. IMPRESSIONS  1. Left ventricular ejection fraction, by estimation, is 60 to 65%. The left ventricle has normal function. The left ventricle has no regional wall motion abnormalities. There is mild left ventricular hypertrophy. Left ventricular diastolic function could not be evaluated.  2. Right ventricular systolic function is normal. The right ventricular size is normal.  3. Left atrial size was moderately dilated. No left atrial/left atrial appendage thrombus was detected. The LAA emptying velocity was 45 cm/s.  4. Right atrial size was grossly normal.  5. The mitral valve is normal in structure. Mild mitral valve regurgitation. No evidence of mitral  stenosis.  6. The aortic valve is tricuspid. Aortic valve regurgitation is not visualized. Mild aortic valve sclerosis is present, with no evidence of aortic valve stenosis. Conclusion(s)/Recommendation(s): No evidence of vegetation/infective endocarditis on this transesophageal echocardiogram. FINDINGS  Left Ventricle: Left ventricular ejection fraction, by estimation, is 60 to 65%. The left ventricle has normal function. The left ventricle has no regional wall motion abnormalities. The left ventricular internal cavity size was normal in size. There is  mild left ventricular hypertrophy. Left ventricular diastolic function could not be evaluated. Right Ventricle: The right ventricular size is normal. No increase in right ventricular wall thickness. Right ventricular systolic function is normal. Left Atrium: Left atrial size was moderately dilated. No left atrial/left atrial appendage thrombus was detected. The LAA emptying velocity was 45 cm/s. Right Atrium: Right atrial size was grossly normal. Pericardium: There is no evidence of pericardial effusion. Mitral Valve: The mitral valve is normal in structure. Mild mitral valve regurgitation. No evidence of mitral valve stenosis. There is no evidence of mitral valve vegetation. Tricuspid Valve: The tricuspid valve is grossly normal. Tricuspid valve regurgitation is not demonstrated. No evidence of tricuspid stenosis. There is no evidence of tricuspid valve vegetation. Aortic Valve: The aortic valve is tricuspid. Aortic valve regurgitation is not visualized. Mild aortic valve sclerosis is present, with no evidence of aortic valve stenosis. Aortic valve mean gradient measures 6.0 mmHg. Aortic valve peak gradient measures 13.4 mmHg. There is no evidence of aortic valve vegetation. Pulmonic Valve: The pulmonic valve was grossly normal. Pulmonic valve regurgitation is not visualized. No evidence of pulmonic stenosis. There is no evidence of pulmonic valve vegetation.  Aorta: The aortic root and ascending aorta are structurally normal, with no evidence of dilitation. There is minimal (Grade I) plaque involving the ascending aorta. IAS/Shunts: The interatrial septum appears to be lipomatous. No atrial level shunt detected by color flow Doppler.  LEFT VENTRICLE PLAX 2D LVOT diam:     2.30 cm LVOT Area:     4.15 cm  AORTIC VALVE AV Vmax:      183.00 cm/s AV Vmean:     119.000 cm/s AV VTI:       0.296 m AV Peak Grad: 13.4 mmHg AV Mean Grad: 6.0 mmHg  AORTA Ao Root diam: 3.30 cm Ao Asc diam:  3.10 cm  SHUNTS Systemic Diam: 2.30 cm Ludwika Rodd DO Electronically signed by Tessa Lerner DO Signature Date/Time: 09/28/2021/6:24:50 PM    Final    VAS [place] LOWER EXTREMITY VENOUS (DVT)  Result Date: 09/27/2021  Lower Venous DVT Study Patient Name:  HUBER MATHERS  Date of Exam:   09/27/2021 Medical Rec #: [id number]        Accession #:    [id number] Date of Birth: Feb 23, 1936        Patient Gender: M Patient Age:   86 years Exam Location:  [facility] Procedure:      VAS [place] LOWER EXTREMITY VENOUS (DVT) Referring Phys: MARSHALL CHAMBLISS --------------------------------------------------------------------------------  Indications: SOB, and suspected PE.  Comparison Study: No prior studies. Performing Technologist: Jean Rosenthal RDMS, RVT  Examination Guidelines: A complete evaluation includes B-mode imaging, spectral Doppler, color Doppler, and power Doppler as needed of all accessible portions of each vessel. Bilateral testing is considered an integral part of a complete examination. Limited examinations for reoccurring indications may be performed as noted. The reflux portion of the exam is performed with the patient in reverse Trendelenburg.  +---------+---------------+---------+-----------+----------+--------------+ RIGHT    CompressibilityPhasicitySpontaneityPropertiesThrombus Aging +---------+---------------+---------+-----------+----------+--------------+ CFV      Full            Yes      Yes                                 +---------+---------------+---------+-----------+----------+--------------+ SFJ      Full                                                        +---------+---------------+---------+-----------+----------+--------------+ FV Prox  Full                                                        +---------+---------------+---------+-----------+----------+--------------+ FV Mid   Full                                                        +---------+---------------+---------+-----------+----------+--------------+ FV DistalFull                                                        +---------+---------------+---------+-----------+----------+--------------+ PFV      Full                                                        +---------+---------------+---------+-----------+----------+--------------+  POP      Full           Yes      Yes                                 +---------+---------------+---------+-----------+----------+--------------+ PTV      Full                                                        +---------+---------------+---------+-----------+----------+--------------+ PERO     Full                                                        +---------+---------------+---------+-----------+----------+--------------+   +---------+---------------+---------+-----------+---------------+-------------+ LEFT     CompressibilityPhasicitySpontaneityProperties     Thrombus                                                                 Aging         +---------+---------------+---------+-----------+---------------+-------------+ CFV      Full           Yes      Yes                                     +---------+---------------+---------+-----------+---------------+-------------+ SFJ      Full                                                             +---------+---------------+---------+-----------+---------------+-------------+ FV Prox  Full                                                            +---------+---------------+---------+-----------+---------------+-------------+ FV Mid   Full                                                            +---------+---------------+---------+-----------+---------------+-------------+ FV DistalFull                                                            +---------+---------------+---------+-----------+---------------+-------------+ PFV      Full                                                            +---------+---------------+---------+-----------+---------------+-------------+  POP      Partial        Yes      Yes        brightly       Chronic                                                   echogenic                    +---------+---------------+---------+-----------+---------------+-------------+ PTV      Full                                                            +---------+---------------+---------+-----------+---------------+-------------+ PERO     Full                                                            +---------+---------------+---------+-----------+---------------+-------------+ Gastroc  Full                                                            +---------+---------------+---------+-----------+---------------+-------------+     Summary: RIGHT: - There is no evidence of deep vein thrombosis in the lower extremity.  - No cystic structure found in the popliteal fossa.  LEFT: - Findings consistent with chronic deep vein thrombosis involving the left popliteal vein.  - No cystic structure found in the popliteal fossa.  *See table(s) above for measurements and observations. Electronically signed by Gerarda Fraction on 09/27/2021 at 7:47:59 PM.    Final     CARDIAC DATABASE: EKG: 09/23/2021: Normal sinus rhythm, 99 bpm, left axis  deviation, consider old inferior infarct, without underlying ischemia or injury pattern.   Echocardiogram: 09/27/2021:  1. Left ventricular ejection fraction, by estimation, is 65 to 70%. The  left ventricle has normal function. The left ventricle has no regional  wall motion abnormalities. There is mild concentric left ventricular  hypertrophy. Left ventricular diastolic  parameters are consistent with Grade I diastolic dysfunction (impaired  relaxation).   2. On short axis view (frame 16) there is a higly mobile echolucent mass  ( 1.14 cm x 0.99 cm) which is concerning for a thrombus in transit vs  vegetation. Unclear if this attaches to the RV wall due to poor  visibility. Right ventricular systolic  function was not well visualized. The right ventricular size is normal.  Tricuspid regurgitation signal is inadequate for assessing PA pressure.   3. Left atrial size was mildly dilated.   4. The mitral valve is normal in structure. No evidence of mitral valve  regurgitation. No evidence of mitral stenosis.   5. The aortic valve is tricuspid. Aortic valve regurgitation is not  visualized. No aortic stenosis is present.   6. The inferior vena cava is normal in size with greater than 50%  respiratory variability, suggesting right atrial pressure of 3 mmHg.   Scheduled Meds:  amoxicillin-clavulanate  1 tablet Oral Q12H   diclofenac Sodium  2 g Topical QID   doxycycline  100 mg Oral Q12H   gabapentin  100 mg Oral TID   melatonin  5 mg Oral QHS   pantoprazole  40 mg Oral Daily    Continuous Infusions:  heparin 1,550 Units/hr (09/29/21 1354)    PRN Meds: acetaminophen, dextrose, ipratropium-albuterol, ondansetron (ZOFRAN) IV, polyethylene glycol, traMADol   IMPRESSION & RECOMMENDATIONS: JONTE PARMAN is a 85 y.o. male whose past medical history and cardiac risk factors include: COPD, non-insulin-dependent diabetes mellitus type 2, hypertension, hyperlipidemia, former smoker,  advanced age.  Impression: Abnormal echocardiogram. Hypoxia requiring nasal cannula oxygen. Chronic DVT Non-insulin-dependent diabetes mellitus type 2. Hypertension. Hyperlipidemia. COPD. Former smoker  Plan: Cardiology was consulted after transfer thoracic echo noted possible mobile echolucent mass 1.14 x 0.99 cm concerning for either thrombus in transit versus vegetation.  Patient underwent transesophageal echocardiogram yesterday 09/28/2021 results discussed with the patient, his wife, and daughter Inetta Fermo during today's rounds.  Postprocedural he has done well without any complications.  DVT management per primary team.  Recommend outpatient cardiology follow-up.  Will defer management of his chronic comorbid conditions and active problem list to the primary team for now.  Cardiology will sign off please call if any questions or concerns arise.  Patient's questions and concerns were addressed to his satisfaction. He voices understanding of the instructions provided during this encounter.   This note was created using a voice recognition software as a result there may be grammatical errors inadvertently enclosed that do not reflect the nature of this encounter. Every attempt is made to correct such errors.  Total time spent: 25 minutes  Edmon Magid [place], DO, [facility] Office: [phone] 09/29/2021, 4:14 PM

## 2021-09-29 NOTE — Progress Notes (Addendum)
Family Medicine Teaching Service Daily Progress Note Intern Pager: [phone]  Patient name: Joshua [name] Medical record number: [id number] Date of birth: 1936/07/03 Age: 85 y.o. Gender: male  Primary Care Provider: Kaleen Mask, MD Consultants: General surgery Code Status: Full  Pt Overview and Major Events to Date:  09/23/2021-admitted 09/24/2021-cholecystectomy  Assessment and Plan: Joshua [name] is an 85 year old male with past medical history of type 2 diabetes, COPD, HLD, hypertension admitted for cholecystitis and is s/p cholecystectomy with prolonged stay due to new oxygen requirement.  Acute gangrenous Coley cystitis s/p cholecystectomy POD #5.  Patient has been complaining of abdominal pain since surgery.  Abdominal pain has become more diffuse and significantly worsened as of yesterday. Patient completed course of Augmentin 875-125 mg 1 tablet twice daily Today patient states abdominal pain has improved.  He had as needed tramadol twice yesterday.  Surgery has signed off and recommends follow-up outpatient.  -Tylenol 1000 mg every 6 hours prn -Tramadol 50 mg every 6 hours as needed per surgery -PT eval and treat  Acute versus chronic respiratory failure  COPD Patient currently on 2 liters oxygen via nasal cannula with oxygen saturations in the mid 90's with no SOB per pt.  Echo was performed on 09/27/2021 which ruled out CHF as source of oxygen requirement with a EF of 65-70%.  Findings are concerning of thrombus versus vegetation in right ventricle.  TEE was completed yesterday and no thrombus or vegetation was found.  CTA chest from 19/22 showed atelectasis versus pneumonia.  As WBC high at 18.8, p.o. Augmentin patient was taking s/p cholecystectomy was extended and was added to regimen to cover for CAP and atypical pneumonia.  Patient has been afebrile however has been receiving Tylenol.  WBC today is 17.1.  I think it is likely oxygen requirement is due to COPD but  will complete course of antibiotics to cover for pneumonia to be safe. -Doxycycline 100 mg twice daily for total of 5 days -Augmentin twice daily with end date of today -Keep O2 saturation 88-92% -Incentive spirometry -DuoNeb every 6 hours as needed -Follow-up with primary care regarding COPD management  Chronic DVT After echocardiogram on 09/27/2021 showed possible thrombus versus vegetation, patient had work-up of CTA to rule out PE and ultrasound of lower extremities to rule out DVT.  Left lower extremity was found to have a chronic DVT involving left popliteal vein.  Patient was started on a heparin drip the evening of 09/27/2021.  Patient and family will continue to discuss with or not they would like to continue anticoagulation upon discharge.  Family states patient enjoys being outside but has history of falls.  This will be taken into consideration with their decision.  Chronic low back pain -Continue gabapentin 100 mg 3 times daily -Continue Tylenol 1000 mg every 6 hours -Continue tramadol 50 mg every 6 hours as needed -Voltaren gel as needed  Type 2 diabetes Glucose yesterday morning was 80 and therefore Jardiance was held.  Repeat blood sugars yesterday afternoon of 75.  Was given one ampule dextrose 50%.  Repeat CBG of 91.  This morning CBG of 78.  Patient has not been eating much.  This could be contributing to his low blood sugars. -Continue to hold Jardiance  Goals of care Palliative has been consulted.  Plan is for patient to go home with home health either today or tomorrow depending on how the day goes.  PT will continue to evaluate and treat patient with recommendations of what he  may need at home  [facility] Resolved. 1.35 yesterday, down from 1.96 on 09/25/2021 after receiving contrast for CT.  Baseline appears to be around 1.3-1.5  HTN Blood pressure this morning is 136/70 -Continue to hold home amlodipine   FEN/GI: Carb modified PPx: Heparin Dispo:Home with  home health   today or tomorrow . Barriers include PT evaluation and treatment, how patient does this morning.   Subjective:  Patient states he is feeling well today, much better.  States he still does have some abdominal pain but it has much improved.  Denies any shortness of breath.  He would like to go home soon as possible.  Objective: Temp:  [97.1 F (36.2 C)-99.4 F (37.4 C)] 99 F (37.2 C) (10/21 0400) Pulse Rate:  [90-98] 93 (10/21 0000) Resp:  [17-23] 22 (10/21 0400) BP: (109-147)/(56-78) 136/70 (10/21 0400) SpO2:  [91 %-97 %] 93 % (10/21 0400) Weight:  [96.2 kg] 96.2 kg (10/20 1336) Physical Exam: General: Patient lying comfortably in bed, NAD Cardiovascular: RRR Respiratory: CTAB, normal work of breathing Abdomen: Soft, nontender to palpation, nondistended, normal bowel sounds Extremities: No edema of BLEs  Laboratory: Recent Labs  Lab 09/27/21 1632 09/28/21 0559 09/29/21 0117  WBC 20.1* 18.8* 17.1*  HGB 13.1 13.2 12.4*  HCT 40.2 40.2 38.3*  PLT PLATELET CLUMPS NOTED ON SMEAR, UNABLE TO ESTIMATE 186 161   Recent Labs  Lab 09/23/21 2027 09/24/21 0254 09/25/21 0928 09/25/21 2223 09/26/21 0142 09/27/21 0121 09/28/21 0559  NA 140 141 137   < > 134* 130* 135  K 3.9 4.2 4.1   < > 4.0 3.3* 4.3  CL 101 105 100   < > 100 95* 99  CO2 27 25 25    < > 25 25 26   BUN 17 19 31*   < > 30* 23 21  CREATININE 1.36* 1.42* 1.96*   < > 1.68* 1.46* 1.35*  CALCIUM 9.0 8.5* 8.4*   < > 8.3* 8.1* 8.6*  PROT 6.8 6.1* 5.7*  --   --   --   --   BILITOT 0.9 0.9 1.1  --   --   --   --   ALKPHOS 62 46 35*  --   --   --   --   ALT 54* 40 39  --   --   --   --   AST 46* 32 42*  --   --   --   --   GLUCOSE 231* 148* 129*   < > 104* 98 94   < > = values in this interval not displayed.      , DO 09/29/2021, 5:19 AM PGY-1, [place] [facility] FPTS Intern pager: [phone], text pages welcome

## 2021-09-29 NOTE — Progress Notes (Cosign Needed)
   Durable Medical Equipment (From admission, onward)        Start     Ordered  09/29/21 0955  For home use only DME Hospital bed  Once      Question Answer Comment Length of Need Lifetime  Patient has (list medical condition): type 2 diabetes, COPD, HLD, hypertension admitted for cholecystitis and is s/p cholecystectomy. pneumonia  The above medical condition requires: Patient requires the ability to reposition frequently  Bed type Semi-electric  Support Surface: Gel Overlay    09/29/21 0958  09/27/21 1212  For home use only DME oxygen  Once      Question Answer Comment Length of Need Lifetime  Mode or (Route) Nasal cannula  Liters per Minute 2  Frequency Continuous (stationary and portable oxygen unit needed)  Oxygen delivery system Gas    09/27/21 1211

## 2021-09-29 NOTE — Progress Notes (Signed)
FPTS Brief Progress Note  S:Pt sleeping.   O: BP 136/70   Pulse 93   Temp 99 F (37.2 C) (Oral)   Resp (!) 22   Ht 6' (1.829 m)   Wt 96.2 kg   SpO2 93%   BMI 28.76 kg/m     A/P: No agitation overnight.  - Orders reviewed. Labs for AM ordered, which was adjusted as needed.    Katha Cabal, DO 09/29/2021, 5:48 AM PGY-3, [place] [facility] Night Resident  Please page [phone] with questions.

## 2021-09-29 NOTE — Progress Notes (Signed)
ANTICOAGULATION CONSULT NOTE - Consult  Pharmacy Consult for heparin Indication: DVT  No Known Allergies  Patient Measurements: Height: 6' (182.9 cm) Weight: 96.2 kg (212 lb 1.3 oz) IBW/kg (Calculated) : 77.6 Heparin Dosing Weight: 96kg  Vital Signs: Temp: 99.2 F (37.3 C) (10/21 0735) Temp Source: Oral (10/21 0735) BP: 136/70 (10/21 0400) Pulse Rate: 100 (10/21 0735)  Labs: Recent Labs    09/27/21 0121 09/27/21 1632 09/27/21 1632 09/28/21 0559 09/28/21 1121 09/29/21 0117  HGB  --  13.1   < > 13.2  --  12.4*  HCT  --  40.2  --  40.2  --  38.3*  PLT  --  PLATELET CLUMPS NOTED ON SMEAR, UNABLE TO ESTIMATE  --  186  --  161  HEPARINUNFRC  --   --   --  0.39 0.33 0.37  CREATININE 1.46*  --   --  1.35*  --   --    < > = values in this interval not displayed.     Estimated Creatinine Clearance: 49.8 mL/min (A) (by C-G formula based on SCr of 1.35 mg/dL (H)).   Medical History: Past Medical History:  Diagnosis Date   Anemia    hx.   Arthritis    COPD (chronic obstructive pulmonary disease) (HCC)    reports at one poin he had copd but he has stopped smoking and now doesnt have any trouble breathing or  need an inhaler    Diabetes mellitus without complication (HCC)    type 2   Headache    none recently    High cholesterol    History of kidney stones    HOH (hard of hearing)    both ears   Hypertension    Pneumonia    years ago    Assessment: 13 yoM admitted w/ abdominal pain s/p lap chole on 10/16. Pt noted on ECHO to have possible thrombus vs vegetation. CT chest negative for PE but dopplers positive for chronic L DVT. Pharmacy asked to start heparin with no bolus.  Heparin level therapeutic at 0.37, slightly increased after adjusting yesterday to target middle of therapeutic range. Hb stable at 12.4 and platelets 161K. No bleeding or complications noted.   Goal of Therapy:  Heparin level 0.3-0.7 units/ml Monitor platelets by anticoagulation protocol:  Yes   Plan:  Continue IV heparin 1,550 units/hr. Will follow up plans for transitioning to oral anticoagulation.  Recheck heparin level with am labs.  Continue to monitor H&H and platelets.   Larena Sox, PharmD Candidate  09/29/2021 9:44 AM

## 2021-09-29 NOTE — Anesthesia Postprocedure Evaluation (Signed)
Anesthesia Post Note  Patient: Joshua [name]  Procedure(s) Performed: TRANSESOPHAGEAL ECHOCARDIOGRAM (TEE)     Patient location during evaluation: PACU Anesthesia Type: MAC Level of consciousness: awake and alert Pain management: pain level controlled Vital Signs Assessment: post-procedure vital signs reviewed and stable Respiratory status: spontaneous breathing, nonlabored ventilation and respiratory function stable Cardiovascular status: blood pressure returned to baseline and stable Postop Assessment: no apparent nausea or vomiting Anesthetic complications: no   No notable events documented.  Last Vitals:  Vitals:   09/29/21 0000 09/29/21 0400  BP: 129/61 136/70  Pulse: 93   Resp: (!) 22 (!) 22  Temp: 37.4 C 37.2 C  SpO2: 93% 93%    Last Pain:  Vitals:   09/29/21 0400  TempSrc: Oral  PainSc:    Pain Goal: Patients Stated Pain Goal: 3 (09/27/21 2230)                 Lynda Rainwater

## 2021-09-29 NOTE — Progress Notes (Signed)
Occupational Therapy Treatment Patient Details Name: Joshua [name] MRN: [id number] DOB: [birth year] Today's Date: 09/29/2021   History of present illness 85 y.o. male presenting with abdominal pain likely secondary to cholecystitis. 09/24/21 Laparoscopic cholecystectomy  PMH is significant for T2DM, COPD, HLD, HTN   OT comments  Patient with nice progress toward patient focused goals.  Able to mobilize in the room with Min guard and RW for toileting.  Max A for hygiene sit/stand level.  Light grooming with setup seated.  Continues to express abdominal discomfort.  OT to continue efforts in the acute setting for a return home with [facility] OT.  Deficits remain, and are listed below.     Recommendations for follow up therapy are one component of a multi-disciplinary discharge planning process, led by the attending physician.  Recommendations may be updated based on patient status, additional functional criteria and insurance authorization.    Follow Up Recommendations  Home health OT    Equipment Recommendations  Other (comment);3 in 1 bedside commode (Bed Rail)    Recommendations for Other Services      Precautions / Restrictions Precautions Precautions: Fall Restrictions Weight Bearing Restrictions: No       Mobility Bed Mobility Overal bed mobility: Needs Assistance Bed Mobility: Rolling;Sidelying to Sit Rolling: Supervision Sidelying to sit: Min guard         Patient Response: Cooperative  Transfers Overall transfer level: Needs assistance Equipment used: Rolling walker (2 wheeled) Transfers: Sit to/from [employer] Sit to Stand: Min assist Stand pivot transfers: Min guard            Balance   Sitting-balance support: No upper extremity supported;Feet supported Sitting balance-Leahy Scale: Good     Standing balance support: Bilateral upper extremity supported Standing balance-Leahy Scale: Poor                             ADL  either performed or assessed with clinical judgement   ADL       Grooming: Wash/dry hands;Wash/dry face;Sitting;Set up               Lower Body Dressing: Maximal assistance;Sitting/lateral leans               Functional mobility during ADLs: Min guard;Rolling walker       Vision       Perception     Praxis      Cognition Arousal/Alertness: Awake/alert Behavior During Therapy: WFL for tasks assessed/performed Overall Cognitive Status: Within Functional Limits for tasks assessed                                          Exercises     Shoulder Instructions       General Comments relies on RW for stability    Pertinent Vitals/ Pain       Faces Pain Scale: Hurts even more Pain Location: abd Pain Descriptors / Indicators: Guarding;Discomfort Pain Intervention(s): Patient requesting pain meds-RN notified                                                          Frequency  Min 2X/week  Progress Toward Goals  OT Goals(current goals can now be found in the care plan section)  Progress towards OT goals: Progressing toward goals  Acute Rehab OT Goals Patient Stated Goal: to get stronger and go home. OT Goal Formulation: With patient Time For Goal Achievement: 10/09/21 Potential to Achieve Goals: Good  Plan      Co-evaluation                 AM-PAC OT "6 Clicks" Daily Activity     Outcome Measure   Help from another person eating meals?: None Help from another person taking care of personal grooming?: None Help from another person toileting, which includes using toliet, bedpan, or urinal?: A Lot Help from another person bathing (including washing, rinsing, drying)?: A Lot Help from another person to put on and taking off regular upper body clothing?: A Little Help from another person to put on and taking off regular lower body clothing?: A Lot 6 Click Score: 17    End of Session Equipment  Utilized During Treatment: Rolling walker;Oxygen  OT Visit Diagnosis: Unsteadiness on feet (R26.81);Muscle weakness (generalized) (M62.81);History of falling (Z91.81)   Activity Tolerance Patient tolerated treatment well   Patient Left in chair;with call bell/phone within reach;with chair alarm set;with family/visitor present;with nursing/sitter in room   Nurse Communication          Time: 0932-1000 OT Time Calculation (min): 28 min  Charges: OT General Charges $OT Visit: 1 Visit OT Evaluation $OT Eval Moderate Complexity: 1 Mod OT Treatments $Self Care/Home Management : 23-37 mins  09/29/2021  RP, OTR/L  Acute Rehabilitation Services  Office:  [phone]   Suzanna Obey 09/29/2021, 10:04 AM

## 2021-09-29 NOTE — Progress Notes (Signed)
Physical Therapy Treatment Patient Details Name: Joshua [name] MRN: [id number] DOB: 1936/08/12 Today's Date: 09/29/2021   History of Present Illness 85 y.o. male presenting with abdominal pain likely secondary to cholecystitis. 09/24/21 Laparoscopic cholecystectomy  PMH is significant for T2DM, COPD, HLD, HTN    PT Comments    Pt was seen for mobility but due to abd pain declined gait for exercises at chair.  Pt is comfortably able to assist on LLE but RLE is weaker and seems more uncomfortable with abd shift.  Pt is recommended to get home with therapy and his issue is not ease of movement but is pain. Continue on with gait and OOB to chair, and may need to be premedicated for therapy to reduce his symptoms.  Pt is not very verbal today, lethargic and then complaining of abd pain. Follow for goals of acute PT.   Recommendations for follow up therapy are one component of a multi-disciplinary discharge planning process, led by the attending physician.  Recommendations may be updated based on patient status, additional functional criteria and insurance authorization.  Follow Up Recommendations  Home health PT;Supervision for mobility/OOB     Equipment Recommendations  None recommended by PT    Recommendations for Other Services       Precautions / Restrictions Precautions Precautions: Fall Precaution Comments: on O2, recent falls Restrictions Weight Bearing Restrictions: No     Mobility  Bed Mobility Overal bed mobility: Needs Assistance Bed Mobility: Rolling;Sidelying to Sit Rolling: Supervision Sidelying to sit: Min guard       General bed mobility comments: up in chair when PT arrived    Transfers Overall transfer level: Needs assistance Equipment used: Rolling walker (2 wheeled) Transfers: Sit to/from [employer] Sit to Stand: Min assist Stand pivot transfers: Min guard       General transfer comment: declined  Ambulation/Gait                  Stairs             Wheelchair Mobility    Modified Rankin (Stroke Patients Only)       Balance Overall balance assessment: Needs assistance Sitting-balance support: Feet supported Sitting balance-Leahy Scale: Good     Standing balance support: Bilateral upper extremity supported Standing balance-Leahy Scale: Poor                              Cognition Arousal/Alertness: Awake/alert Behavior During Therapy: WFL for tasks assessed/performed Overall Cognitive Status: Within Functional Limits for tasks assessed Area of Impairment: Problem solving;Safety/judgement;Awareness;Following commands;Attention;Memory;Orientation                 Orientation Level: Time Current Attention Level: Selective Memory: Decreased short-term memory;Decreased recall of precautions Following Commands: Follows one step commands with increased time Safety/Judgement: Decreased awareness of deficits;Decreased awareness of safety Awareness: Intellectual Problem Solving: Slow processing;Requires verbal cues General Comments: pt tends to be almost fearful of abd pain and did not get relief from meds given by nursing earlier      Exercises      General Comments General comments (skin integrity, edema, etc.): pt is nearly fearful of pain in abd with meds having been given already.  Sent message to nursing via ward secretary to get further meds if possible      Pertinent Vitals/Pain Pain Assessment: Faces Faces Pain Scale: Hurts even more Pain Location: abd Pain Descriptors / Indicators: Grimacing;Guarding Pain Intervention(s): Monitored  during session;Repositioned    Home Living                      Prior Function            PT Goals (current goals can now be found in the care plan section) Acute Rehab PT Goals Patient Stated Goal: to get stronger and go home. Progress towards PT goals: Not progressing toward goals - comment    Frequency     Min 3X/week      PT Plan Current plan remains appropriate    Co-evaluation              AM-PAC PT "6 Clicks" Mobility   Outcome Measure  Help needed turning from your back to your side while in a flat bed without using bedrails?: A Little Help needed moving from lying on your back to sitting on the side of a flat bed without using bedrails?: A Little Help needed moving to and from a bed to a chair (including a wheelchair)?: A Little Help needed standing up from a chair using your arms (e.g., wheelchair or bedside chair)?: A Little Help needed to walk in hospital room?: A Lot Help needed climbing 3-5 steps with a railing? : Total 6 Click Score: 15    End of Session Equipment Utilized During Treatment: Oxygen Activity Tolerance: Patient limited by pain Patient left: in chair;with call bell/phone within reach;with family/visitor present;with chair alarm set Nurse Communication: Mobility status PT Visit Diagnosis: Muscle weakness (generalized) (M62.81);Difficulty in walking, not elsewhere classified (R26.2);Pain Pain - Right/Left:  (abdomen) Pain - part of body:  (abdomen)     Time: 2119-4174 PT Time Calculation (min) (ACUTE ONLY): 27 min  Charges:  $Therapeutic Exercise: 8-22 mins $Therapeutic Activity: 8-22 mins               Ivar Drape 09/29/2021, 1:15 PM  Samul Dada, PT PhD Acute Rehab Dept. Number: [facility] [id number] and [facility] [phone]

## 2021-09-29 NOTE — Progress Notes (Signed)
FPTS Brief Note  Informed by RN that patient's daughter Inetta Fermo) and wife Ephrata Lions) were requesting to speak with provider regarding discharge home today.  Patient sleeping comfortably on my arrival. I spoke with his daughter Inetta Fermo and wife [place] Lions at bedside, as well as his daughter Cherly Beach who was present via speaker phone.  Family all agrees they would like to take him home today and feel they have the necessary resources to do so. There is no need for a hospital bed as it will not fit in the house.  They do not wish to wait for palliative consult in the hospital. Family has decided NOT to pursue anticoagulation with Eliquis. They are certain about this decision and are not interested in hematology consult to discuss further.  Will place discharge orders shortly including ambulatory referral to palliative care. Family very appreciative of the care he has received.   Maury Dus, MD PGY-2 [facility] Family Medicine

## 2021-09-29 NOTE — TOC Progression Note (Addendum)
Transition of Care [facility]) - Progression Note    Patient Details  Name: Joshua [name] MRN: [id number] Date of Birth: [birth year]  Transition of Care [facility]) CM/SW Contact  Harriet Masson, RN Phone Number: 09/29/2021, 10:24 AM  Clinical Narrative:    Order for hospital bed. Daughter agreeable to use [facility] for hospital bed. Spoke to [facility]. Will let Shellia know when patient is discharged to setup time for home delivery.  Address, Phone verified  [phone] Family refusing hospital bed. Shelia with [employer] notified.  Expected Discharge Plan: Home w Home Health Services Barriers to Discharge: Continued Medical Work up  Expected Discharge Plan and Services Expected Discharge Plan: Home w Home Health Services   Discharge Planning Services: CM Consult Post Acute Care Choice: Durable Medical Equipment, Home Health Living arrangements for the past 2 months: Single Family Home                 DME Arranged: Hospital bed DME Agency: [facility] Date DME Agency Contacted: 09/29/21 Time DME Agency Contacted: 1021 Representative spoke with at DME Agency: Ezequiel [facility] HH Arranged: PT, OT HH Agency: [facility] Date [facility] Agency Contacted: 09/26/21 Time HH Agency Contacted: 1454 Representative spoke with at [facility] Agency: Amy   Social Determinants of Health (SDOH) Interventions    Readmission Risk Interventions No flowsheet data found.

## 2021-09-29 NOTE — Progress Notes (Signed)
SATURATION QUALIFICATIONS: (This note is used to comply with regulatory documentation for home oxygen)   Patient Saturations on Room Air at Rest = 90%   Patient Saturations on Room Air while Ambulating = 82%   Patient Saturations on 3 Liters of oxygen while Ambulating = 93%   Please briefly explain why patient needs home oxygen: oxygen level drops below 88% on room air, oxygen desat below 88% on room air while sleeping

## 2021-10-01 NOTE — Discharge Summary (Addendum)
Family Medicine Teaching [facility] Discharge Summary  Patient name: Joshua [name] Medical record number: [id number] Date of birth: 09-04-1936 Age: 85 y.o. Gender: male Date of Admission: 09/23/2021     Date of Discharge: 09/29/21 Admitting Physician: Littie Deeds, MD  Primary Care Provider: Kaleen Mask, MD Consultants: General Surgery   Indication for Hospitalization: Cholecystitis and hypoxemia   Discharge Diagnoses/Problem List:  Active Problems:   Cholecystitis   RUQ pain   Abnormal echocardiogram   Hypoxia   Tachycardia   Abnormal EKG   Non-insulin dependent type 2 diabetes mellitus (HCC)   Benign hypertension   Former smoker   Deep vein thrombosis (DVT) of popliteal vein (HCC)  Disposition: Home  Discharge Condition: Stable  Discharge Exam:  General: Patient lying comfortably in bed, NAD Cardiovascular: RRR Respiratory: CTAB, normal work of breathing Abdomen: Soft, nontender to palpation, nondistended, normal bowel sounds Extremities: No edema of BLEs  Brief Hospital Course:  Sammie Denner. Moss is an 85 year old male with PMH of type 2 diabetes, COPD, HLD, HTN admitted for cholecystitis and is s/p cholecystectomy  Acute cholecystitis s/p cholecystectomy After 2 days of abdominal pain, abdominal CT showed distention and edema of gallbladder suggestive of cholecystitis.  Antibiotics were initiated and patient had cholecystectomy on 09/24/21 and was found to have gangrenous cholecystitis.  Patient was transitioned from IV Zosyn to PO Augmentin 875-125 mg 1 tab BID.  Patient should follow-up with surgery outpatient.  PT recommends patient to have  home health upon discharge.  Acute vs chronic respiratory failure  Patient had oxygen requirement upon arrival to ED, has not used oxygen at home previously.  Although his wife states she believes he should use oxygen at home.  Patient does have COPD.  Had chest x-ray on 09/25/21 which showed cardiomegaly with  small bilateral pleural pleural effusions and the left basilar atelectasis/airspace disease. Patient received DuoNebs BID.  Was trialed on 20 mg IV Lasix which did not seem to impact his respiratory status. An echo performed on 09/27/21 showed EF of 65-70% and also showed findings concerning for thrombus versus vegetation in right ventricle. TEE on 09/28/21 showed no evidence of vegetation or thrombus.  CTA chest showed no PE, small bilateral pleural effusions, airspace opacities in lower lobes and right middle lobe which could reflect atelectasis or pneumonia.  Patient was treated with Augmentin for a total of 6 days and was started on doxycycline 100 mg daily for total 5 days.  As patient was hypoxic upon arrival to ED, suspected pneumonia is not hospital-acquired. He was discharged home on 2L O2 as he was unable to wean.   Chronic DVT Patient was found to have chronic DVT involving left popliteal vein and was started on heparin drip. Patient has history of frequent falls so family and pt weighed pros and cons of discharging pt on anticoagulation. Family and patient elected NOT to pursue anticoagulation at discharge. Cardiology recommended out patient follow up. Pt and family would like referral to palliative care.   AKI Patient had creatinine of 1.42 on admission which increased to 1.96 two days after receiving contrast for CT scan.  Upon discharge creatinine improved to 1.46.  Chronic low back pain Patient experienced severe lower back pain throughout his stay and was treated with tramadol and gabapentin.  AAA Patient has AAA 3 cm in size seen on CT which will need repeat imaging every 3 years  Home medications for hypertension and hyperlipidemia were not given during his stay.  Blood pressure remained  stable.  Issues for follow-up 1. F/u with surgery 2. Follow-up on oxygen requirement and respiratory status 3. Consider home medications to manage COPD 4.  Follow-up AAA 5. Ensure follow up  with cardiology out patient  6. Ensure follow up with palliative care   Significant Procedures: TEE 09/28/21  Significant Labs and Imaging:  Recent Labs  Lab 09/27/21 1632 09/28/21 0559 09/29/21 0117  WBC 20.1* 18.8* 17.1*  HGB 13.1 13.2 12.4*  HCT 40.2 40.2 38.3*  PLT PLATELET CLUMPS NOTED ON SMEAR, UNABLE TO ESTIMATE 186 161   Recent Labs  Lab 09/25/21 0928 09/25/21 2223 09/26/21 0142 09/27/21 0121 09/28/21 0559  NA 137 135 134* 130* 135  K 4.1 3.9 4.0 3.3* 4.3  CL 100 101 100 95* 99  CO2 25 26 25 25 26   GLUCOSE 129* 113* 104* 98 94  BUN 31* 30* 30* 23 21  CREATININE 1.96* 1.79* 1.68* 1.46* 1.35*  CALCIUM 8.4* 8.4* 8.3* 8.1* 8.6*  ALKPHOS 35*  --   --   --   --   AST 42*  --   --   --   --   ALT 39  --   --   --   --   ALBUMIN 2.4*  --   --   --   --       Discharge Medications:  Allergies as of 09/29/2021   No Known Allergies      Medication List     STOP taking these medications    amLODipine 10 MG tablet Commonly known as: NORVASC   glipiZIDE 10 MG 24 hr tablet Commonly known as: GLUCOTROL XL   oxyCODONE 5 MG immediate release tablet Commonly known as: Oxy IR/ROXICODONE   oxymetazoline 0.05 % nasal spray Commonly known as: AFRIN   pioglitazone 45 MG tablet Commonly known as: ACTOS   predniSONE 10 MG tablet Commonly known as: DELTASONE       TAKE these medications    acetaminophen 500 MG tablet Commonly known as: TYLENOL Take 2 tablets (1,000 mg total) by mouth every 6 (six) hours as needed. What changed:  medication strength how much to take when to take this reasons to take this Another medication with the same name was removed. Continue taking this medication, and follow the directions you see here.   albuterol 108 (90 Base) MCG/ACT inhaler Commonly known as: ProAir HFA Inhale 2 puffs into the lungs every 4 (four) hours as needed for wheezing or shortness of breath.   doxycycline 100 MG tablet Commonly known as:  VIBRA-TABS Take 1 tablet (100 mg total) by mouth every 12 (twelve) hours for 7 doses.   gabapentin 100 MG capsule Commonly known as: NEURONTIN Take 1 capsule (100 mg total) by mouth 3 (three) times daily. 100 mg TID x 2 weeks, then 100 mg BID x 2 weeks, then 100 mg QD x 2 weeks. What changed:  how much to take when to take this reasons to take this additional instructions   Spiriva Respimat 2.5 MCG/ACT Aers Generic drug: Tiotropium Bromide Monohydrate Inhale 2 puffs into the lungs daily.   traMADol 50 MG tablet Commonly known as: ULTRAM Take 1 tablet (50 mg total) by mouth every 6 (six) hours as needed for moderate pain.        Discharge Instructions: Please refer to Patient Instructions section of EMR for full details.  Patient was counseled important signs and symptoms that should prompt return to medical care, changes in medications, dietary instructions, activity restrictions,  and follow up appointments.   Follow-Up Appointments:  Follow-up Information     [facility] [place] Follow up on 10/17/2021.   Specialty: General Surgery Why: 11:45am, arrive by 11:15am for paperwork and check in process Contact information: [address] [place] [address] [phone]         [facility] .          [facility] Follow up.   Specialty: Rehabilitation Why: They will call you to make an apt. [facility] has changed there name to [employer] information: [facility] [place] [address] [phone]        [facility] Follow up.   Why: Name changed to [employer] Agency that will deliver oxygen Contact information: [address] [place] [address] [phone]         Odis Hollingshead, [place], DO Follow up in 4 week(s).   Specialties: Cardiology, Vascular Surgery Why: Establish care. Contact information: [address] [place] [address] [phone]                  [name], DO 10/01/2021, 6:29 PM PGY-1, [facility]  FPTS Upper-Level Resident Addendum   I have discussed the above with Dr. Yetta Barre and agree with the documented plan. My edits for correction/addition/clarification are included above. Please see any attending notes.   Maury Dus, MD PGY-2, [facility]

## 2021-10-05 ENCOUNTER — Ambulatory Visit (INDEPENDENT_AMBULATORY_CARE_PROVIDER_SITE_OTHER): Payer: Medicare Other | Admitting: Physician Assistant

## 2021-10-05 ENCOUNTER — Other Ambulatory Visit: Payer: Self-pay

## 2021-10-05 ENCOUNTER — Encounter: Payer: Self-pay | Admitting: Physician Assistant

## 2021-10-05 VITALS — BP 150/98 | HR 94 | Temp 98.1°F

## 2021-10-05 DIAGNOSIS — M545 Low back pain, unspecified: Secondary | ICD-10-CM

## 2021-10-05 DIAGNOSIS — Z9049 Acquired absence of other specified parts of digestive tract: Secondary | ICD-10-CM | POA: Diagnosis not present

## 2021-10-05 DIAGNOSIS — K219 Gastro-esophageal reflux disease without esophagitis: Secondary | ICD-10-CM

## 2021-10-05 DIAGNOSIS — Z7689 Persons encountering health services in other specified circumstances: Secondary | ICD-10-CM | POA: Diagnosis not present

## 2021-10-05 DIAGNOSIS — K5900 Constipation, unspecified: Secondary | ICD-10-CM

## 2021-10-05 DIAGNOSIS — J449 Chronic obstructive pulmonary disease, unspecified: Secondary | ICD-10-CM

## 2021-10-05 DIAGNOSIS — I714 Abdominal aortic aneurysm, without rupture, unspecified: Secondary | ICD-10-CM

## 2021-10-05 DIAGNOSIS — R3916 Straining to void: Secondary | ICD-10-CM

## 2021-10-05 DIAGNOSIS — Z09 Encounter for follow-up examination after completed treatment for conditions other than malignant neoplasm: Secondary | ICD-10-CM

## 2021-10-05 DIAGNOSIS — I82439 Acute embolism and thrombosis of unspecified popliteal vein: Secondary | ICD-10-CM

## 2021-10-05 DIAGNOSIS — G8929 Other chronic pain: Secondary | ICD-10-CM

## 2021-10-05 DIAGNOSIS — J189 Pneumonia, unspecified organism: Secondary | ICD-10-CM

## 2021-10-05 DIAGNOSIS — E119 Type 2 diabetes mellitus without complications: Secondary | ICD-10-CM

## 2021-10-05 LAB — POCT URINALYSIS DIPSTICK
Bilirubin, UA: NEGATIVE
Blood, UA: NEGATIVE
Glucose, UA: POSITIVE — AB
Ketones, UA: NEGATIVE
Leukocytes, UA: NEGATIVE
Protein, UA: NEGATIVE
Spec Grav, UA: 1.01 (ref 1.010–1.025)
Urobilinogen, UA: 0.2 E.U./dL
pH, UA: 5.5 (ref 5.0–8.0)

## 2021-10-05 MED ORDER — POLYETHYLENE GLYCOL 3350 17 GM/SCOOP PO POWD: 17.0000 g | Freq: Every day | ORAL | 1 refills | Status: DC

## 2021-10-05 NOTE — Progress Notes (Signed)
New Patient Office Visit  Subjective:  Patient ID: Joshua [name], male    DOB: May 26, 1936  Age: 85 y.o. MRN: [id number]  CC:  Chief Complaint  Patient presents with   New Patient (Initial Visit)    HPI Joshua [name] presents to establish care. Patient is accompanied by his daughter. Patient has a PMHx of diabetes mellitus type 2, COPD, hypertension, chronic back pain, AAA, and chronic DVT. Patient was admitted 09/23/2021 for gangrenous cholecystitis and s/p cholecystectomy, discharged 09/29/21. Patient was started on supplemental oxygen for acute respiratory failure and discharged on 2L. Patient reports compliance with 2L throughout the day and at night. Patient's daughter states oxygen saturations range from 89-91%. Patient was also treated for potential pneumonia. Denies fever, shortness of breath or chest pain. Patient reports has started PT/OT. Patient was treated with tramadol and gabapentin for chronic back pain. States takes 1 tramadol at bedtime as needed and continues with gabapentin. Patient has chronic DVT of left popliteal vein and declined anticoagulant therapy due to concerns of frequent falls. Does not have a cardiology appointment. Patient was started on Jardiance for T2DM and patient reports was previously on a different medication which worked better at controlling his sugars. Does not have sugar log. Patient c/o belching and abdominal pain, has tried famotidine which does provide relief. Also c/o straining to go to the bathroom and with urination. Denies dysuria, blood in the urine, fever or chills.     Past Medical History:  Diagnosis Date   Anemia    hx.   Arthritis    COPD (chronic obstructive pulmonary disease) (HCC)    reports at one poin he had copd but he has stopped smoking and now doesnt have any trouble breathing or  need an inhaler    Diabetes mellitus without complication ([place])    type 2   Headache    none recently    High cholesterol    History of  kidney stones    HOH (hard of hearing)    both ears   Hypertension    Pneumonia    years ago    Past Surgical History:  Procedure Laterality Date   ANKLE FRACTURE SURGERY Left    when [age]- years old   ANTERIOR CERVICAL DECOMP/DISCECTOMY FUSION N/A 07/11/2017   Procedure: Anterior Cervical Decompression/Discectomy Fusion  - Cervical three-four;  Surgeon: Kary Kos, MD;  Location: [facility];  Service: Neurosurgery;  Laterality: N/A; limited extension of neck currently    CATARACT EXTRACTION Bilateral    CHOLECYSTECTOMY N/A 09/24/2021   Procedure: LAPAROSCOPIC CHOLECYSTECTOMY;  Surgeon: Dwan Bolt, MD;  Location: [facility];  Service: General;  Laterality: N/A;   EXTRACORPOREAL SHOCK WAVE LITHOTRIPSY Right 07/14/2020   Procedure: EXTRACORPOREAL SHOCK WAVE LITHOTRIPSY (ESWL);  Surgeon: Irine Seal, MD;  Location[facility];  Service: Urology;  Laterality: Right;   FRACTURE SURGERY      fx. lower left leg at age 37-13   TEE WITHOUT CARDIOVERSION N/A 09/28/2021   Procedure: TRANSESOPHAGEAL ECHOCARDIOGRAM (TEE);  Surgeon: Rex Kras, DO;  Location: [facility];  Service: Cardiovascular;  Laterality: N/A;   TOTAL KNEE ARTHROPLASTY Right 11/11/2017   Procedure: RIGHT TOTAL KNEE ARTHROPLASTY;  Surgeon: Gaynelle Arabian, MD;  Location: [facility];  Service: Orthopedics;  Laterality: Right;   TOTAL KNEE ARTHROPLASTY Left 06/01/2019   Procedure: LEFT TOTAL KNEE ARTHROPLASTY;  Surgeon: Gaynelle Arabian, MD;  Location: [facility];  Service: Orthopedics;  Laterality: Left;  42mn    Family History  Problem Relation Age of Onset   Diabetes Father    Cancer Father    Alcohol abuse Father    Heart attack Father    Strabismus Daughter     Social History   Socioeconomic History   Marital status: Married    Spouse name: Not on file   Number of children: Not on file   Years of education: Not on file   Highest education level: Not on file  Occupational History   Not on file  Tobacco Use    Smoking status: Former    Packs/day: 2.00    Years: 60.00    Pack years: 120.00    Types: Cigarettes    Quit date: 07/11/2011    Years since quitting: 10.2   Smokeless tobacco: Never  Vaping Use   Vaping Use: Never used  Substance and Sexual Activity   Alcohol use: Never   Drug use: Never   Sexual activity: Not Currently  Other Topics Concern   Not on file  Social History Narrative   Not on file   Social Determinants of Health   Financial Resource Strain: Not on file  Food Insecurity: Not on file  Transportation Needs: Not on file  Physical Activity: Not on file  Stress: Not on file  Social Connections: Not on file  Intimate Partner Violence: Not on file    ROS Review of Systems Review of Systems:  A fourteen system review of systems was performed and found to be positive as per HPI.  Objective:   Today's Vitals: BP (!) 150/98   Pulse 94   Temp 98.1 F (36.7 C)   SpO2 97%   Physical Exam General: Cooperative, in a wheelchair, and mild pallor noted Neuro:  Alert and oriented,  extra-ocular muscles intact  HEENT:  Normocephalic, atraumatic, neck supple Skin: bruising at umbilicus and surgical incisions noted, dry. Abdomen: protuberant, +BS, generalized tenderness, no guarding or rebound tenderness, well healing surgical scars Cardiac:  RRR Respiratory: CTA b/l with sligh dec breath sounds. Not using accessory muscles, speaking in full sentences- unlabored. Vascular:  Ext warm, no cyanosis apprec.; cap RF less 2 sec. Psych:  No HI/SI, judgement and insight good, Euthymic mood. Full Affect.  Assessment & Plan:   Problem List Items Addressed This Visit       Cardiovascular and Mediastinum   Deep vein thrombosis (DVT) of popliteal vein (HCC)   Relevant Orders   Ambulatory referral to Cardiology     Respiratory   COPD (chronic obstructive pulmonary disease) ([place])     Endocrine   Non-insulin dependent type 2 diabetes mellitus ([place])   Relevant Medications    empagliflozin (JARDIANCE) 10 MG TABS tablet   Other Visit Diagnoses     Encounter to establish care    -  [facility] discharge follow-up       Relevant Orders   CBC w/Diff (Completed)   Comp Met (CMET) (Completed)   S/P cholecystectomy       Abdominal aortic aneurysm (AAA) without rupture, unspecified part       Relevant Orders   Ambulatory referral to Cardiology   Pneumonia due to infectious organism, unspecified laterality, unspecified part of lung       Relevant Orders   DG Chest 2 View   Chronic low back pain, unspecified back pain laterality, unspecified whether sciatica present       Straining on urination       Relevant Orders   POCT urinalysis dipstick (Completed)  Constipation, unspecified constipation type       Gastroesophageal reflux disease, unspecified whether esophagitis present       Relevant Medications   famotidine (PEPCID) 20 MG tablet   polyethylene glycol powder (GLYCOLAX/MIRALAX) 17 GM/SCOOP powder      Encounter to establish care, Hospital discharge follow-up, S/p cholecystectomy: -Reviewed hospital notes, labs and imaging. -Will review previous PCP records once obtained. -Patient is scheduled with [facility] 10/17/21. -Recommend to continue with [facility] PT/OT. -Will place cardiology referral for chronic DVT and AAA. -Patient's daughter unsure about palliative care status, will check her sister who is one of the caregivers.  COPD: -Recommend to continue 2L of oxygen. -Continue Spiriva daily and albuterol as needed. -Oxygen 97% on RA.   Pneumonia due to infectious organism: -Patient completed antibiotic therapy (doxycycline). -Will place future order for chest x-ray, due for repeat week of 10/30/2021. -Will collect CBC to monitor leukocytosis.   Chronic low back pain: -Discussed with patient controlled substance policy and recommend referral to pain management if will continue tramadol as maintenance medication. Patient verbalized  understanding and will wait on referral.  -Continue gabapentin 100 mg TID as needed for pain relief.  Non-insulin dependent type 2 diabetes mellitus: -Recommend to continue Jardiance, will review previous PCP records including medication therapy for DM once obtained. -Recommend to start monitoring fasting sugar and keep a log to bring next OV. -Will continue to monitor.   GERD: -Symptoms likely multi-factorial post surgery and possibly GERD related. Recommend to continue famotidine 20 PO BID as needed.  Constipation, Straining on urination : -Patient on opioid which is likely contributing to constipation, recommend to start miralax 1 capful/scoop daily. Constipation possibly contributing to urination strain. UA collected to rule out UTI, UA unremarkable with positive glucose (pt on Jardiance). If urination strain fails to improve or worsen recommend referral to urology. -CT abdomen pelvis w/ contrast 09/23/21: non-obstructing right renal calculi, bilateral renal cysts, prostate normal    Outpatient Encounter Medications as of 10/05/2021  Medication Sig   acetaminophen (TYLENOL) 500 MG tablet Take 2 tablets (1,000 mg total) by mouth every 6 (six) hours as needed.   albuterol (PROAIR HFA) 108 (90 Base) MCG/ACT inhaler Inhale 2 puffs into the lungs every 4 (four) hours as needed for wheezing or shortness of breath.   empagliflozin (JARDIANCE) 10 MG TABS tablet Take 10 mg by mouth daily.   famotidine (PEPCID) 20 MG tablet Take 20 mg by mouth 2 (two) times daily.   gabapentin (NEURONTIN) 100 MG capsule Take 1 capsule (100 mg total) by mouth 3 (three) times daily. 100 mg TID x 2 weeks, then 100 mg BID x 2 weeks, then 100 mg QD x 2 weeks. (Patient taking differently: Take 100-300 mg by mouth every 8 (eight) hours as needed (pain).)   polyethylene glycol powder (GLYCOLAX/MIRALAX) 17 GM/SCOOP powder Take 17 g by mouth daily.   Tiotropium Bromide Monohydrate (SPIRIVA RESPIMAT) 2.5 MCG/ACT AERS Inhale  2 puffs into the lungs daily.   traMADol (ULTRAM) 50 MG tablet Take 1 tablet (50 mg total) by mouth every 6 (six) hours as needed for moderate pain.   No facility-administered encounter medications on file as of 10/05/2021.    Follow-up: Return in about 4 weeks (around 11/02/2021) for DM, COPD, pneumonia .   Lorrene Reid, PA-C

## 2021-10-06 LAB — CBC WITH DIFFERENTIAL/PLATELET
Basophils Absolute: 0.1 10*3/uL (ref 0.0–0.2)
Basos: 0 %
EOS (ABSOLUTE): 0.2 10*3/uL (ref 0.0–0.4)
Eos: 2 %
Hematocrit: 38 % (ref 37.5–51.0)
Hemoglobin: 12.7 g/dL — ABNORMAL LOW (ref 13.0–17.7)
Immature Grans (Abs): 0.7 10*3/uL — ABNORMAL HIGH (ref 0.0–0.1)
Immature Granulocytes: 5 %
Lymphocytes Absolute: 0.7 10*3/uL (ref 0.7–3.1)
Lymphs: 5 %
MCH: 30.7 pg (ref 26.6–33.0)
MCHC: 33.4 g/dL (ref 31.5–35.7)
MCV: 92 fL (ref 79–97)
Monocytes Absolute: 0.9 10*3/uL (ref 0.1–0.9)
Monocytes: 6 %
Neutrophils Absolute: 12.5 10*3/uL — ABNORMAL HIGH (ref 1.4–7.0)
Neutrophils: 82 %
Platelets: 231 10*3/uL (ref 150–450)
RBC: 4.14 x10E6/uL (ref 4.14–5.80)
RDW: 12.7 % (ref 11.6–15.4)
WBC: 15 10*3/uL — ABNORMAL HIGH (ref 3.4–10.8)

## 2021-10-06 LAB — COMPREHENSIVE METABOLIC PANEL
ALT: 20 IU/L (ref 0–44)
AST: 23 IU/L (ref 0–40)
Albumin/Globulin Ratio: 0.9 — ABNORMAL LOW (ref 1.2–2.2)
Albumin: 2.8 g/dL — ABNORMAL LOW (ref 3.6–4.6)
Alkaline Phosphatase: 116 IU/L (ref 44–121)
BUN/Creatinine Ratio: 12 (ref 10–24)
BUN: 16 mg/dL (ref 8–27)
Bilirubin Total: 0.4 mg/dL (ref 0.0–1.2)
CO2: 23 mmol/L (ref 20–29)
Calcium: 9 mg/dL (ref 8.6–10.2)
Chloride: 98 mmol/L (ref 96–106)
Creatinine, Ser: 1.31 mg/dL — ABNORMAL HIGH (ref 0.76–1.27)
Globulin, Total: 3 g/dL (ref 1.5–4.5)
Glucose: 183 mg/dL — ABNORMAL HIGH (ref 70–99)
Potassium: 4.7 mmol/L (ref 3.5–5.2)
Sodium: 136 mmol/L (ref 134–144)
Total Protein: 5.8 g/dL — ABNORMAL LOW (ref 6.0–8.5)
eGFR: 54 mL/min/{1.73_m2} — ABNORMAL LOW (ref >59)

## 2021-10-16 ENCOUNTER — Ambulatory Visit: Payer: Medicare Other | Admitting: Internal Medicine

## 2021-10-16 ENCOUNTER — Other Ambulatory Visit: Payer: Self-pay

## 2021-10-16 ENCOUNTER — Encounter: Payer: Self-pay | Admitting: Internal Medicine

## 2021-10-16 VITALS — BP 129/65 | HR 91 | Ht 72.0 in | Wt 214.6 lb

## 2021-10-16 DIAGNOSIS — I82439 Acute embolism and thrombosis of unspecified popliteal vein: Secondary | ICD-10-CM

## 2021-10-16 NOTE — Progress Notes (Addendum)
Cardiology Office Note:    Date:  10/16/2021   ID:  Joshua [name], DOB 09-08-1936, MRN [id number]  PCP:  Mayer Masker, PA-C   [facility] Providers Cardiologist:  None     Referring MD: Mayer Masker, PA-C   No chief complaint on file. DVT, c/f aortic aneurysm  History of Present Illness:    Joshua [name] is a 85 y.o. male with a hx of diabetes mellitus type 2 well controlled, COPD, hypertension, chronic back pain, AAA, and chronic DVT not on [facility] per family decision, here to discuss management of AAA  Joshua [name] presents after a hospitalization admited on 09/23/2021 with gangrene cholecystitis. He is s/p chole. He was found to have chronic left popliteal. He notes a leg break as a teenager. He's had colon CA screening.  His hgb is normal. The team had a family discussion regarding his DVT and the family opted to not pursue AC 2/2 falls at home. He was noted to have small AAA 3.0 cm. In he DC summary, the team discussed palliative care with the family in terms of his care. He was referred to cardiology to help manage. He is on O2. He is using spirometer. His ct showed trace BL effusions. Some atelectasis with possible PNA. He had no PE. No chest pain.He is ambulating fine with O2.  He finished antibiotics with c/f pna. He was noted to have possible vegetation on his echo. TEE was negative. He otherwise had normal LV/RV function. No valve abnormalities. No pulmonary HTN. He was discharged 09/29/2021.  Today he comes in with his daughter who was not with him in the hospital. They have not all discussed plans for anticoagulation. Otherwise, he has no chest pain, sob, le edema, pnd or orthopnea. He is still on O2.  Past Medical History:  Diagnosis Date   Anemia    hx.   Arthritis    COPD (chronic obstructive pulmonary disease) (HCC)    reports at one poin he had copd but he has stopped smoking and now doesnt have any trouble breathing or  need an inhaler    Diabetes mellitus  without complication (HCC)    type 2   Headache    none recently    High cholesterol    History of kidney stones    HOH (hard of hearing)    both ears   Hypertension    Pneumonia    years ago    Past Surgical History:  Procedure Laterality Date   ANKLE FRACTURE SURGERY Left    when [age]- years old   ANTERIOR CERVICAL DECOMP/DISCECTOMY FUSION N/A 07/11/2017   Procedure: Anterior Cervical Decompression/Discectomy Fusion  - Cervical three-four;  Surgeon: Donalee Citrin, MD;  Location: [facility] OR;  Service: Neurosurgery;  Laterality: N/A; limited extension of neck currently    CATARACT EXTRACTION Bilateral    CHOLECYSTECTOMY N/A 09/24/2021   Procedure: LAPAROSCOPIC CHOLECYSTECTOMY;  Surgeon: Fritzi Mandes, MD;  Location: [facility] OR;  Service: General;  Laterality: N/A;   EXTRACORPOREAL SHOCK WAVE LITHOTRIPSY Right 07/14/2020   Procedure: EXTRACORPOREAL SHOCK WAVE LITHOTRIPSY (ESWL);  Surgeon: Bjorn Pippin, MD;  Location: [facility];  Service: Urology;  Laterality: Right;   FRACTURE SURGERY      fx. lower left leg at age 65-13   TEE WITHOUT CARDIOVERSION N/A 09/28/2021   Procedure: TRANSESOPHAGEAL ECHOCARDIOGRAM (TEE);  Surgeon: Tessa Lerner, DO;  Location: [facility] ENDOSCOPY;  Service: Cardiovascular;  Laterality: N/A;   TOTAL KNEE ARTHROPLASTY Right 11/11/2017   Procedure:  RIGHT TOTAL KNEE ARTHROPLASTY;  Surgeon: Ollen Gross, MD;  Location: [facility] ORS;  Service: Orthopedics;  Laterality: Right;   TOTAL KNEE ARTHROPLASTY Left 06/01/2019   Procedure: LEFT TOTAL KNEE ARTHROPLASTY;  Surgeon: Ollen Gross, MD;  Location: [facility] ORS;  Service: Orthopedics;  Laterality: Left;     Current Medications: Current Meds  Medication Sig   acetaminophen (TYLENOL) 500 MG tablet Take 2 tablets (1,000 mg total) by mouth every 6 (six) hours as needed.   albuterol (PROAIR HFA) 108 (90 Base) MCG/ACT inhaler Inhale 2 puffs into the lungs every 4 (four) hours as needed for wheezing or shortness of breath.    amLODipine (NORVASC) 10 MG tablet Take 10 mg by mouth daily.   empagliflozin (JARDIANCE) 10 MG TABS tablet Take 10 mg by mouth daily.   famotidine (PEPCID) 20 MG tablet Take 20 mg by mouth 2 (two) times daily.   gabapentin (NEURONTIN) 100 MG capsule Take 1 capsule (100 mg total) by mouth 3 (three) times daily. 100 mg TID x 2 weeks, then 100 mg BID x 2 weeks, then 100 mg QD x 2 weeks. (Patient taking differently: Take 100-300 mg by mouth every 8 (eight) hours as needed (pain).)   glipiZIDE (GLUCOTROL XL) 10 MG 24 hr tablet Take 10 mg by mouth daily with breakfast.   polyethylene glycol powder (GLYCOLAX/MIRALAX) 17 GM/SCOOP powder Take 17 g by mouth daily.   sodium chloride (OCEAN) 0.65 % SOLN nasal spray Place 1 spray into both nostrils as needed for congestion.   Tiotropium Bromide Monohydrate (SPIRIVA RESPIMAT) 2.5 MCG/ACT AERS Inhale 2 puffs into the lungs daily.   traMADol (ULTRAM) 50 MG tablet Take 1 tablet (50 mg total) by mouth every 6 (six) hours as needed for moderate pain.     Allergies:   Patient has no known allergies.   Social History   Socioeconomic History   Marital status: Married    Spouse name: Not on file   Number of children: Not on file   Years of education: Not on file   Highest education level: Not on file  Occupational History   Not on file  Tobacco Use   Smoking status: Former    Packs/day: 2.00    Years: 60.00    Pack years: 120.00    Types: Cigarettes    Quit date: 07/11/2011    Years since quitting: 10.2   Smokeless tobacco: Never  Vaping Use   Vaping Use: Never used  Substance and Sexual Activity   Alcohol use: Never   Drug use: Never   Sexual activity: Not Currently  Other Topics Concern   Not on file  Social History Narrative   Not on file   Social Determinants of Health   Financial Resource Strain: Not on file  Food Insecurity: Not on file  Transportation Needs: Not on file  Physical Activity: Not on file  Stress: Not on file  Social  Connections: Not on file     Family History: The patient's family history includes Alcohol abuse in his father; Cancer in his father; Diabetes in his father; Heart attack in his father; Strabismus in his daughter.  ROS:   Please see the history of present illness.     All other systems reviewed and are negative.  EKGs/Labs/Other Studies Reviewed:    The following studies were reviewed today:   EKG:  EKG is  ordered today.  The ekg ordered today demonstrates  NSR, inferior q waves  Recent Labs: 09/25/2021: B Natriuretic Peptide  106.3 10/05/2021: ALT 20; BUN 16; Creatinine, Ser 1.31; Hemoglobin 12.7; Platelets 231; Potassium 4.7; Sodium 136  Recent Lipid Panel No results found for: CHOL, TRIG, HDL, CHOLHDL, VLDL, LDLCALC, LDLDIRECT   Risk Assessment/Calculations:           Physical Exam:    VS:  Ht 6' (1.829 m)   Wt 214 lb 9.6 oz (97.3 kg)   BMI 29.10 kg/m     Wt Readings from Last 3 Encounters:  10/16/21 214 lb 9.6 oz (97.3 kg)  09/28/21 212 lb 1.3 oz (96.2 kg)  07/04/21 214 lb 15.2 oz (97.5 kg)     GEN:  Well nourished, well developed in no acute distress. Sitting in a wheel chair HEENT: Normal NECK: No JVD; No carotid bruits LYMPHATICS: No lymphadenopathy CARDIAC: RRR, no murmurs, rubs, gallops RESPIRATORY:  Nl wob on O2. Mild decreased BS in the bases.  No rales, wheezing or rhonchi  ABDOMEN: Soft, non-tender, non-distended MUSCULOSKELETAL:  No edema; No deformity  SKIN: Warm and dry NEUROLOGIC:  Alert and oriented x 3 PSYCHIATRIC:  Normal affect   ASSESSMENT:    1. Deep vein thrombosis (DVT) of popliteal vein, unspecified chronicity, unspecified laterality (HCC)   -We discussed anticoagulation and IVC filter. We agreed to allow for her to discuss plans with her family and then talk to their primary provider in December. We discussed risk with falls and anticoagulation. Has bled risk is low 3.4%; however with frequent falls there can be risk with SDH.  Although typically falls need to be substantial. Considering his age and comorbidities, it is reasonable to refrain from [facility] if they want to go the palliative route. IVC filter is also an option. It depends on how aggressive the family would like to be in his care. I would recommend allowing him to recover from his hospital visit and come off O2 before considering IVC filter (typically this is via interventional radiology). He had no PE on his CT scan 09/27/2021  AAA- small. No need to frequently follow up.   PLAN:    In order of problems listed above:  Stopped pioglitazone [ associated with fluid retention, although he does not have CHF, may help wean from O2]. Glucose is well managed and he is only taking half a pill Three months follow up         Medication Adjustments/Labs and Tests Ordered: Current medicines are reviewed at length with the patient today.  Concerns regarding medicines are outlined above.  Orders Placed This Encounter  Procedures   EKG 12-Lead    Signed, Maisie Fus, MD  10/16/2021 1:28 PM    [facility]

## 2021-10-16 NOTE — Patient Instructions (Signed)
Medication Instructions:  STOP: PIOGLITAZONE  *If you need a refill on your cardiac medications before your next appointment, please call your pharmacy*  Follow-Up: At [facility], you and your health needs are our priority.  As part of our continuing mission to provide you with exceptional heart care, we have created designated Provider Care Teams.  These Care Teams include your primary Cardiologist (physician) and Advanced Practice Providers (APPs -  Physician Assistants and Nurse Practitioners) who all work together to provide you with the care you need, when you need it.  Your next appointment:   3 month(s)  The format for your next appointment:   In Person  Provider:   Maisie Fus, MD {  Other Instructions  Deep Vein Thrombosis Deep vein thrombosis (DVT) is a condition in which a blood clot forms in a vein of the deep venous system. This can occur in the lower leg, thigh, pelvis, arm, or neck. A clot is blood that has thickened into a gel or solid. This condition is serious and can be life-threatening if the clot travels to the arteries of the lungs and causes a blockage (pulmonary embolism). A DVT can also damage veins in the leg, which can lead to long-term venous disease, leg pain, swelling, discoloration, and ulcers or sores (post-thrombotic syndrome). What are the causes? This condition may be caused by: A slowdown of blood flow. Damage to a vein. A condition that causes blood to clot more easily, such as certain bleeding disorders. What increases the risk? The following factors may make you more likely to develop this condition: Obesity. Being older, especially older than age 52. Being inactive or not moving around (sedentary lifestyle). This may include: Sitting or lying down for longer than 4-6 hours other than to sleep at night. Being in the hospital, or having major or lengthy surgery. Having any recent bone injuries, such as breaks (fractures), that reduce  movement, especially in the lower extremities. Having recent orthopedic surgery on the lower extremities. Being pregnant, giving birth, or having recently given birth. Taking medicines that contain estrogen, such as birth control or hormone replacement therapy. Using products that contain nicotine or tobacco, especially if you use hormonal birth control. Having a history of a blood vessel disease (peripheral vascular disease) or congestive heart disease. Having a history of cancer, especially if being treated with chemotherapy. What are the signs or symptoms? Symptoms of this condition include: Swelling, pain, pressure, or tenderness in an arm or a leg. An arm or a leg becoming warm, red, or discolored. A leg turning very pale or blue. You may have a large DVT. This is rare. If the clot is in your leg, you may notice that symptoms get worse when you stand or walk. In some cases, there are no symptoms. How is this diagnosed? This condition is diagnosed with: Your medical history and a physical exam. Tests, such as: Blood tests to check how well your blood clots. Doppler ultrasound. This is the best way to find a DVT. CT venogram. Contrast dye is injected into a vein, and X-rays are taken to check for clots. This is helpful for veins in the chest or pelvis. How is this treated? Treatment for this condition depends on: The cause of your DVT. The size and location of your DVT, or having more than one DVT. Your risk for bleeding or developing more clots. Other medical conditions you may have. Treatment may include: Taking a blood thinner medicine (anticoagulant) to prevent more clots  from forming or current clots from growing. Wearing compression stockings. Injecting medicines into the affected vein to break up the clot (catheter-directed thrombolysis). Surgical procedures, when DVT is severe or hard to treat. These may be done to: Isolate and remove your clot. Place an inferior vena cava  (IVC) filter. This filter is placed into a large vein called the inferior vena cava to catch blood clots before they reach your lungs. You may get some medical treatments for 6 months or longer. Follow these instructions at home: If you are taking blood thinners: Talk with your health care provider before you take any medicines that contain aspirin or NSAIDs, such as ibuprofen. These medicines increase your risk for dangerous bleeding. Take your medicine exactly as told, at the same time every day. Do not skip a dose. Do not take more than the prescribed dose. This is important. Ask your health care provider about foods and medicines that could change or interact with the way your blood thinner works. Avoid these foods and medicines if you are told to do so. Avoid anything that may cause bleeding or bruising. You may bleed more easily while taking blood thinners. Be very careful when using knives, scissors, or other sharp objects. Use an electric razor instead of a blade. Avoid activities that could cause injury or bruising, and follow instructions for preventing falls. Tell your health care provider if you have had any internal bleeding, bleeding ulcers, or neurologic diseases, such as strokes or cerebral aneurysms. Wear a medical alert bracelet or carry a card that lists what medicines you take. General instructions Take over-the-counter and prescription medicines only as told by your health care provider. Return to your normal activities as told by your health care provider. Ask your health care provider what activities are safe for you. If recommended, wear compression stockings as told by your health care provider. These stockings help to prevent blood clots and reduce swelling in your legs. Never wear your compression stockings while sleeping at night. Keep all follow-up visits. This is important. Where to find more information American Heart Association: www.heart.org Centers for Disease  Control and Prevention: FootballExhibition.com.br National Heart, Lung, and Blood Institute: PopSteam.is Contact a health care provider if: You miss a dose of your blood thinner. You have unusual bruising or other color changes. You have new or worse pain, swelling, or redness in an arm or a leg. You have worsening numbness or tingling in an arm or a leg. You have a significant color change (pale or blue) in the extremity that has the DVT. Get help right away if: You have signs or symptoms that a blood clot has moved to the lungs. These may include: Shortness of breath. Chest pain. Fast or irregular heartbeats (palpitations). Light-headedness, dizziness, or fainting. Coughing up blood. You have signs or symptoms that your blood is too thin. These may include: Blood in your vomit, stool, or urine. A cut that will not stop bleeding. A menstrual period that is heavier than usual. A severe headache or confusion. These symptoms may be an emergency. Get help right away. Call 911. Do not wait to see if the symptoms will go away. Do not drive yourself to the hospital. Summary Deep vein thrombosis (DVT) happens when a blood clot forms in a deep vein. This may occur in the lower leg, thigh, pelvis, arm, or neck. Symptoms affect the arm or leg and can include swelling, pain, tenderness, warmth, redness, or discoloration. This condition may be treated with medicines. In  severe cases, a procedure or surgery may be done to remove or dissolve the clots. If you are taking blood thinners, take them exactly as told. Do not skip a dose. Do not take more than is prescribed. Get help right away if you have a severe headache, shortness of breath, chest pain, fast or irregular heartbeats, or blood in your vomit, urine, or stool. This information is not intended to replace advice given to you by your health care provider. Make sure you discuss any questions you have with your health care provider. Document Revised:  06/19/2021 Document Reviewed: 06/19/2021 Elsevier Patient Education  2022 [employer].

## 2021-10-23 ENCOUNTER — Encounter: Payer: Self-pay | Admitting: Physician Assistant

## 2021-10-23 ENCOUNTER — Ambulatory Visit
Admission: RE | Admit: 2021-10-23 | Discharge: 2021-10-23 | Disposition: A | Discharge status: Home / Self Care | Payer: Medicare Other | Source: Ambulatory Visit | Attending: Physician Assistant | Admitting: Physician Assistant

## 2021-10-23 ENCOUNTER — Other Ambulatory Visit: Payer: Self-pay

## 2021-10-23 DIAGNOSIS — J189 Pneumonia, unspecified organism: Secondary | ICD-10-CM

## 2021-10-31 ENCOUNTER — Other Ambulatory Visit: Payer: Self-pay

## 2021-10-31 ENCOUNTER — Other Ambulatory Visit: Payer: Self-pay | Admitting: Physician Assistant

## 2021-10-31 DIAGNOSIS — E119 Type 2 diabetes mellitus without complications: Secondary | ICD-10-CM

## 2021-10-31 MED ORDER — AMLODIPINE BESYLATE 10 MG PO TABS: 10.0000 mg | ORAL_TABLET | Freq: Every day | ORAL | 0 refills | Status: DC

## 2021-10-31 MED ORDER — GLIPIZIDE ER 10 MG PO TB24: 10.0000 mg | ORAL_TABLET | Freq: Every day | ORAL | 0 refills | Status: DC

## 2021-10-31 NOTE — Telephone Encounter (Signed)
Per Kandis Cocking sending refill to requested pharmacy. AS, CMA

## 2021-10-31 NOTE — Telephone Encounter (Signed)
Patient is requesting refill of these two medications. Patient has recently established care in our office and these medications were previously prescribed by old PCP. Please approve if deemed appropriate. AS, CMA

## 2021-11-06 ENCOUNTER — Telehealth: Payer: Self-pay | Admitting: Physician Assistant

## 2021-11-06 ENCOUNTER — Other Ambulatory Visit: Payer: Self-pay | Admitting: Nurse Practitioner

## 2021-11-06 DIAGNOSIS — J449 Chronic obstructive pulmonary disease, unspecified: Secondary | ICD-10-CM

## 2021-11-06 MED ORDER — SPIRIVA RESPIMAT 2.5 MCG/ACT IN AERS: 2.0000 | INHALATION_SPRAY | Freq: Every day | RESPIRATORY_TRACT | 2 refills | Status: DC

## 2021-11-06 NOTE — Telephone Encounter (Signed)
I sent new prescription for his spiriva respimat to [facility].

## 2021-11-06 NOTE — Telephone Encounter (Signed)
Patients wife came into the office requesting a refill on Spiriva Respimat 2.5 mcg. She states he has been out of medication x3 days. They use [facility]. Please advise. [phone]

## 2021-11-06 NOTE — Telephone Encounter (Signed)
Called and left a vm letting pt know his medication had been called in [facility]

## 2021-11-07 ENCOUNTER — Telehealth: Payer: Self-pay | Admitting: Physician Assistant

## 2021-11-07 MED ORDER — ALBUTEROL SULFATE HFA 108 (90 BASE) MCG/ACT IN AERS: 2.0000 | INHALATION_SPRAY | RESPIRATORY_TRACT | 0 refills | Status: DC | PRN

## 2021-11-07 NOTE — Telephone Encounter (Signed)
Med sent to pharmacy. AS, CMA 

## 2021-11-07 NOTE — Telephone Encounter (Signed)
Patient called and is out of his albuterol inhaler and wants to know if he can get a refill? [phone]

## 2021-11-09 ENCOUNTER — Telehealth: Payer: Self-pay | Admitting: Physician Assistant

## 2021-11-09 DIAGNOSIS — J449 Chronic obstructive pulmonary disease, unspecified: Secondary | ICD-10-CM

## 2021-11-09 NOTE — Telephone Encounter (Signed)
Patients nurse case manager called requesting a portable oxygen tank for him to take out when going out places. Can the order please be sent to [facility]?

## 2021-11-09 NOTE — Telephone Encounter (Signed)
--  Order has been faxed to [facility] at [phone] with patient last office note, demographics and insurance information. AS, CMA

## 2021-11-20 ENCOUNTER — Encounter: Payer: Self-pay | Admitting: Physician Assistant

## 2021-11-20 ENCOUNTER — Other Ambulatory Visit: Payer: Self-pay

## 2021-11-20 ENCOUNTER — Ambulatory Visit (INDEPENDENT_AMBULATORY_CARE_PROVIDER_SITE_OTHER): Payer: Medicare Other | Admitting: Physician Assistant

## 2021-11-20 VITALS — BP 128/67 | HR 98 | Temp 98.4°F | Ht 72.0 in | Wt 215.0 lb

## 2021-11-20 DIAGNOSIS — E119 Type 2 diabetes mellitus without complications: Secondary | ICD-10-CM

## 2021-11-20 DIAGNOSIS — G8929 Other chronic pain: Secondary | ICD-10-CM

## 2021-11-20 DIAGNOSIS — J449 Chronic obstructive pulmonary disease, unspecified: Secondary | ICD-10-CM

## 2021-11-20 DIAGNOSIS — M545 Low back pain, unspecified: Secondary | ICD-10-CM

## 2021-11-20 MED ORDER — GLIPIZIDE ER 10 MG PO TB24: ORAL_TABLET | ORAL | 0 refills | Status: DC

## 2021-11-20 MED ORDER — PREDNISONE 20 MG PO TABS: 20.0000 mg | ORAL_TABLET | Freq: Every day | ORAL | 0 refills | Status: DC | PRN

## 2021-11-20 MED ORDER — TRELEGY ELLIPTA 100-62.5-25 MCG/ACT IN AEPB: 1.0000 | INHALATION_SPRAY | Freq: Every day | RESPIRATORY_TRACT | 6 refills | Status: DC

## 2021-11-20 NOTE — Patient Instructions (Signed)

## 2021-11-20 NOTE — Progress Notes (Signed)
Established Patient Office Visit  Subjective:  Patient ID: Joshua [name], male    DOB: 02-Aug-1936  Age: 85 y.o. MRN: [id number]  CC:  Chief Complaint  Patient presents with   Follow-up   Diabetes   COPD    HPI Joshua [name] presents for follow up on diabetes mellitus and COPD. Patient brings previous prescription bottles of prednisone 20 mg and tramadol 50 mg which he takes when needed for his chronic back pain and asking for refills.  Diabetes: Pt denies increased urination or thirst. Pt reports medication compliance with Glipizide 5 mg, states 10 mg caused his sugar to drop. Checking glucose at home 2-3 times per week. FBS average 118.  COPD: Reports unable to tolerate Spiriva due to worsening cough. Has a cough  which is worse at night. States has been able to do things around the house such as mowing the lawn and raking the leaves. Pulse ox at home has been 92-95%. Has not used supplemental oxygen in the last 7-8 days.   Past Medical History:  Diagnosis Date   Anemia    hx.   Arthritis    COPD (chronic obstructive pulmonary disease) (HCC)    reports at one poin he had copd but he has stopped smoking and now doesnt have any trouble breathing or  need an inhaler    Diabetes mellitus without complication ([place])    type 2   Headache    none recently    High cholesterol    History of kidney stones    HOH (hard of hearing)    both ears   Hypertension    Pneumonia    years ago    Past Surgical History:  Procedure Laterality Date   ANKLE FRACTURE SURGERY Left    when [age]- years old   ANTERIOR CERVICAL DECOMP/DISCECTOMY FUSION N/A 07/11/2017   Procedure: Anterior Cervical Decompression/Discectomy Fusion  - Cervical three-four;  Surgeon: Kary Kos, MD;  Location: [facility];  Service: Neurosurgery;  Laterality: N/A; limited extension of neck currently    CATARACT EXTRACTION Bilateral    CHOLECYSTECTOMY N/A 09/24/2021   Procedure: LAPAROSCOPIC CHOLECYSTECTOMY;  Surgeon:  Dwan Bolt, MD;  Location: [facility];  Service: General;  Laterality: N/A;   EXTRACORPOREAL SHOCK WAVE LITHOTRIPSY Right 07/14/2020   Procedure: EXTRACORPOREAL SHOCK WAVE LITHOTRIPSY (ESWL);  Surgeon: Irine Seal, MD;  Location: [facility];  Service: Urology;  Laterality: Right;   FRACTURE SURGERY      fx. lower left leg at age 70-13   TEE WITHOUT CARDIOVERSION N/A 09/28/2021   Procedure: TRANSESOPHAGEAL ECHOCARDIOGRAM (TEE);  Surgeon: Rex Kras, DO;  Location: [facility];  Service: Cardiovascular;  Laterality: N/A;   TOTAL KNEE ARTHROPLASTY Right 11/11/2017   Procedure: RIGHT TOTAL KNEE ARTHROPLASTY;  Surgeon: Gaynelle Arabian, MD;  Location: [facility];  Service: Orthopedics;  Laterality: Right;   TOTAL KNEE ARTHROPLASTY Left 06/01/2019   Procedure: LEFT TOTAL KNEE ARTHROPLASTY;  Surgeon: Gaynelle Arabian, MD;  Location: [facility];  Service: Orthopedics;  Laterality: Left;  66mn    Family History  Problem Relation Age of Onset   Diabetes Father    Cancer Father    Alcohol abuse Father    Heart attack Father    Strabismus Daughter     Social History   Socioeconomic History   Marital status: Married    Spouse name: Not on file   Number of children: Not on file   Years of education: Not on file   Highest  education level: Not on file  Occupational History   Not on file  Tobacco Use   Smoking status: Former    Packs/day: 2.00    Years: 60.00    Pack years: 120.00    Types: Cigarettes    Quit date: 07/11/2011    Years since quitting: 10.3   Smokeless tobacco: Never  Vaping Use   Vaping Use: Never used  Substance and Sexual Activity   Alcohol use: Never   Drug use: Never   Sexual activity: Not Currently  Other Topics Concern   Not on file  Social History Narrative   Not on file   Social Determinants of Health   Financial Resource Strain: Not on file  Food Insecurity: Not on file  Transportation Needs: Not on file  Physical Activity: Not on file  Stress: Not  on file  Social Connections: Not on file  Intimate Partner Violence: Not on file    Outpatient Medications Prior to Visit  Medication Sig Dispense Refill   acetaminophen (TYLENOL) 500 MG tablet Take 2 tablets (1,000 mg total) by mouth every 6 (six) hours as needed. 30 tablet 0   amLODipine (NORVASC) 10 MG tablet Take 1 tablet (10 mg total) by mouth daily. 90 tablet 0   traMADol (ULTRAM) 50 MG tablet Take 1 tablet (50 mg total) by mouth every 6 (six) hours as needed for moderate pain. 15 tablet 0   glipiZIDE (GLUCOTROL XL) 10 MG 24 hr tablet Take 1 tablet (10 mg total) by mouth daily with breakfast. 90 tablet 0   albuterol (PROAIR HFA) 108 (90 Base) MCG/ACT inhaler Inhale 2 puffs into the lungs every 4 (four) hours as needed for wheezing or shortness of breath. 1 each 0   empagliflozin (JARDIANCE) 10 MG TABS tablet Take 10 mg by mouth daily.     gabapentin (NEURONTIN) 100 MG capsule Take 1 capsule (100 mg total) by mouth 3 (three) times daily. 100 mg TID x 2 weeks, then 100 mg BID x 2 weeks, then 100 mg QD x 2 weeks. (Patient taking differently: Take 100-300 mg by mouth every 8 (eight) hours as needed (pain).) 84 capsule 0   polyethylene glycol powder (GLYCOLAX/MIRALAX) 17 GM/SCOOP powder Take 17 g by mouth daily. 850 g 1   sodium chloride (OCEAN) 0.65 % SOLN nasal spray Place 1 spray into both nostrils as needed for congestion.     famotidine (PEPCID) 20 MG tablet Take 20 mg by mouth 2 (two) times daily.     Tiotropium Bromide Monohydrate (SPIRIVA RESPIMAT) 2.5 MCG/ACT AERS Inhale 2 puffs into the lungs daily. 4 g 2   No facility-administered medications prior to visit.    No Known Allergies  ROS Review of Systems A fourteen system review of systems was performed and found to be positive as per HPI.   Objective:    Physical Exam General:  Well Developed, well nourished, in no acute distress  Neuro:  Alert and oriented,  extra-ocular muscles intact  HEENT:  Normocephalic, atraumatic,  neck supple Skin:  no gross rash, warm, pink. Cardiac:  RRR, S1 S2 Respiratory: +Wheezing, dec breath sounds at left lower lung base, no crackles or rales.  Vascular:  Ext warm, no cyanosis apprec.; cap RF less 2 sec. Psych:  No HI/SI, judgement and insight good, Euthymic mood. Full Affect.  BP 128/67   Pulse 98   Temp 98.4 F (36.9 C)   Ht 6' (1.829 m)   Wt 215 lb (97.5 kg)   SpO2  94%   BMI 29.16 kg/m  Wt Readings from Last 3 Encounters:  11/20/21 215 lb (97.5 kg)  10/16/21 214 lb 9.6 oz (97.3 kg)  09/28/21 212 lb 1.3 oz (96.2 kg)     Health Maintenance Due  Topic Date Due   COVID-19 Vaccine (1) Never done   Pneumonia Vaccine 1+ Years old (1 - PCV) Never done   OPHTHALMOLOGY EXAM  Never done   URINE MICROALBUMIN  Never done   TETANUS/TDAP  Never done   Zoster Vaccines- Shingrix (1 of 2) Never done   INFLUENZA VACCINE  07/10/2021    There are no preventive care reminders to display for this patient.  No results found for: TSH Lab Results  Component Value Date   WBC 15.0 (H) 10/05/2021   HGB 12.7 (L) 10/05/2021   HCT 38.0 10/05/2021   MCV 92 10/05/2021   PLT 231 10/05/2021   Lab Results  Component Value Date   NA 136 10/05/2021   K 4.7 10/05/2021   CO2 23 10/05/2021   GLUCOSE 183 (H) 10/05/2021   BUN 16 10/05/2021   CREATININE 1.31 (H) 10/05/2021   BILITOT 0.4 10/05/2021   ALKPHOS 116 10/05/2021   AST 23 10/05/2021   ALT 20 10/05/2021   PROT 5.8 (L) 10/05/2021   ALBUMIN 2.8 (L) 10/05/2021   CALCIUM 9.0 10/05/2021   ANIONGAP 10 09/28/2021   EGFR 54 (L) 10/05/2021   No results found for: CHOL No results found for: HDL No results found for: LDLCALC No results found for: TRIG No results found for: CHOLHDL Lab Results  Component Value Date   HGBA1C 7.2 (H) 09/25/2021      Assessment & Plan:   Problem List Items Addressed This Visit       Respiratory   COPD (chronic obstructive pulmonary disease) ([place])    -Discussed changing Spiriva to  Trelegy, patient willing to try new inhaler. Advised to let me know if unable to tolerate new inhaler or unaffordable. -Recommend to continue monitoring oxygen saturations at home, (target is 88-92%). -Oxygen saturation today is 96% RA w/o supplemental oxygen.       Relevant Medications   predniSONE (DELTASONE) 20 MG tablet   Fluticasone-Umeclidin-Vilant (TRELEGY ELLIPTA) 100-62.5-25 MCG/ACT AEPB     Endocrine   Non-insulin dependent type 2 diabetes mellitus ([place]) - Primary    -A1c 09/25/2021 7.2, stable. Will repeat A1c in 3 months. -Recommend to continue 0.5 tab of Glipizide 10 mg XL and monitor glucose daily. -Discussed low carbohydrate and glucose diet. -Will continue to monitor.      Relevant Medications   glipiZIDE (GLUCOTROL XL) 10 MG 24 hr tablet   Other Visit Diagnoses     Chronic low back pain, unspecified back pain laterality, unspecified whether sciatica present       Relevant Medications   predniSONE (DELTASONE) 20 MG tablet      Chronic back pain: -Patient has hx of cervical discectomy and fusion. -Discussed with patient risks vs benefits of opioids and controlled substance policy for opioids, recommend referral to pain management for medication management, patient declined at this time. Will provide a refill of prednisone 20 mg to take when needed for exacerbation, discussed to monitor glucose due to side effects of hyperglycemia. Pt verbalized understanding.   Meds ordered this encounter  Medications   predniSONE (DELTASONE) 20 MG tablet    Sig: Take 1 tablet (20 mg total) by mouth daily as needed.    Dispense:  15 tablet    Refill:  0    Order Specific Question:   Supervising Provider    Answer:   Hali Marry [[id number]]   Fluticasone-Umeclidin-Vilant (TRELEGY ELLIPTA) 100-62.5-25 MCG/ACT AEPB    Sig: Inhale 1 puff into the lungs daily.    Dispense:  1 each    Refill:  6    Order Specific Question:   Supervising Provider    Answer:   Beatrice Lecher D [[id number]]   glipiZIDE (GLUCOTROL XL) 10 MG 24 hr tablet    Sig: Take 0.5 tablet by mouth daily with breaskfast.    Dispense:  45 tablet    Refill:  0    Order Specific Question:   Supervising Provider    Answer:   Beatrice Lecher D [[id number]]    Follow-up: Return in about 3 months (around 02/18/2022) for [facility] and [facility] few days prior .    Lorrene Reid, PA-C

## 2021-11-20 NOTE — Assessment & Plan Note (Signed)
-  A1c 09/25/2021 7.2, stable. Will repeat A1c in 3 months. -Recommend to continue 0.5 tab of Glipizide 10 mg XL and monitor glucose daily. -Discussed low carbohydrate and glucose diet. -Will continue to monitor.

## 2021-11-20 NOTE — Assessment & Plan Note (Signed)
-  Discussed changing Spiriva to Trelegy, patient willing to try new inhaler. Advised to let me know if unable to tolerate new inhaler or unaffordable. -Recommend to continue monitoring oxygen saturations at home, (target is 88-92%). -Oxygen saturation today is 96% RA w/o supplemental oxygen.

## 2021-11-20 NOTE — Assessment & Plan Note (Signed)
>>  ASSESSMENT AND PLAN FOR DIABETES MELLITUS TYPE 2 IN NONOBESE ([place]) WRITTEN ON 02/05/2023  4:37 PM BY Lashawnda Hancox A, PA  >>ASSESSMENT AND PLAN FOR NON-INSULIN DEPENDENT TYPE 2 DIABETES MELLITUS ([place]) WRITTEN ON 11/20/2021  6:27 PM BY ABONZA, MARITZA, PA-C  -A1c 09/25/2021 7.2, stable. Will repeat A1c in 3 months. -Recommend to continue 0.5 tab of Glipizide 10 mg XL and monitor glucose daily. -Discussed low carbohydrate and glucose diet. -Will continue to monitor.

## 2021-11-20 NOTE — Assessment & Plan Note (Signed)
>>  ASSESSMENT AND PLAN FOR NON-INSULIN DEPENDENT TYPE 2 DIABETES MELLITUS ([place]) WRITTEN ON 11/20/2021  6:27 PM BY ABONZA, MARITZA, PA-C  -A1c 09/25/2021 7.2, stable. Will repeat A1c in 3 months. -Recommend to continue 0.5 tab of Glipizide 10 mg XL and monitor glucose daily. -Discussed low carbohydrate and glucose diet. -Will continue to monitor.

## 2021-11-22 ENCOUNTER — Encounter: Payer: Self-pay | Admitting: Physician Assistant

## 2021-11-23 ENCOUNTER — Telehealth: Payer: Self-pay | Admitting: Physician Assistant

## 2021-11-23 NOTE — Telephone Encounter (Signed)
Patient home health nurse calling to say patient has sent his oxygen that was ordered by the hospital he sent back to the company and states he doesn't need it anymore. AS, CMA

## 2021-11-30 ENCOUNTER — Ambulatory Visit: Payer: Medicare Other | Admitting: Physician Assistant

## 2021-12-14 ENCOUNTER — Encounter: Payer: Self-pay | Admitting: Physician Assistant

## 2022-01-19 ENCOUNTER — Ambulatory Visit: Payer: Medicare Other | Admitting: Internal Medicine

## 2022-02-12 ENCOUNTER — Other Ambulatory Visit: Payer: Self-pay | Admitting: Physician Assistant

## 2022-02-15 ENCOUNTER — Other Ambulatory Visit: Payer: Self-pay

## 2022-02-15 DIAGNOSIS — Z0000 Encounter for general adult medical examination without abnormal findings: Secondary | ICD-10-CM

## 2022-02-15 DIAGNOSIS — Z13228 Encounter for screening for other metabolic disorders: Secondary | ICD-10-CM

## 2022-02-19 ENCOUNTER — Other Ambulatory Visit: Payer: Medicare Other

## 2022-02-26 ENCOUNTER — Other Ambulatory Visit: Payer: Self-pay

## 2022-02-26 ENCOUNTER — Telehealth: Payer: Self-pay | Admitting: Physician Assistant

## 2022-02-26 ENCOUNTER — Ambulatory Visit (INDEPENDENT_AMBULATORY_CARE_PROVIDER_SITE_OTHER): Payer: Medicare Other | Admitting: Physician Assistant

## 2022-02-26 ENCOUNTER — Encounter: Payer: Self-pay | Admitting: Physician Assistant

## 2022-02-26 VITALS — BP 150/71 | HR 90 | Temp 97.9°F | Ht 72.0 in | Wt 196.0 lb

## 2022-02-26 DIAGNOSIS — E1165 Type 2 diabetes mellitus with hyperglycemia: Secondary | ICD-10-CM | POA: Insufficient documentation

## 2022-02-26 DIAGNOSIS — J449 Chronic obstructive pulmonary disease, unspecified: Secondary | ICD-10-CM

## 2022-02-26 DIAGNOSIS — Z87891 Personal history of nicotine dependence: Secondary | ICD-10-CM | POA: Diagnosis not present

## 2022-02-26 DIAGNOSIS — E119 Type 2 diabetes mellitus without complications: Secondary | ICD-10-CM

## 2022-02-26 DIAGNOSIS — Z13228 Encounter for screening for other metabolic disorders: Secondary | ICD-10-CM

## 2022-02-26 DIAGNOSIS — Z0000 Encounter for general adult medical examination without abnormal findings: Secondary | ICD-10-CM | POA: Diagnosis not present

## 2022-02-26 DIAGNOSIS — Z130 Encounter for screening for diseases of the blood and blood-forming organs and certain disorders involving the immune mechanism: Secondary | ICD-10-CM

## 2022-02-26 DIAGNOSIS — Z1321 Encounter for screening for nutritional disorder: Secondary | ICD-10-CM

## 2022-02-26 DIAGNOSIS — E7800 Pure hypercholesterolemia, unspecified: Secondary | ICD-10-CM | POA: Insufficient documentation

## 2022-02-26 DIAGNOSIS — I10 Essential (primary) hypertension: Secondary | ICD-10-CM

## 2022-02-26 DIAGNOSIS — G8929 Other chronic pain: Secondary | ICD-10-CM

## 2022-02-26 DIAGNOSIS — M545 Low back pain, unspecified: Secondary | ICD-10-CM

## 2022-02-26 DIAGNOSIS — Z1329 Encounter for screening for other suspected endocrine disorder: Secondary | ICD-10-CM

## 2022-02-26 LAB — POCT GLYCOSYLATED HEMOGLOBIN (HGB A1C): Hemoglobin A1C: 6.2 % — AB (ref 4.0–5.6)

## 2022-02-26 MED ORDER — GABAPENTIN 100 MG PO CAPS: 100.0000 mg | ORAL_CAPSULE | Freq: Three times a day (TID) | ORAL | 0 refills | Status: DC

## 2022-02-26 MED ORDER — VALSARTAN 40 MG PO TABS: 40.0000 mg | ORAL_TABLET | Freq: Every day | ORAL | 0 refills | Status: DC

## 2022-02-26 NOTE — Telephone Encounter (Signed)
Patients insurance will not cover CPT  [phone] (low dose chest ct) due to patients age. They are stating he is too old for the imaging. Cut off age is 33.  ?

## 2022-02-26 NOTE — Patient Instructions (Addendum)
Ask for Diabetic Eye Exam when scheduling his eye exam at [facility] ? ?Preventive Care 37 Years and Older, Male ?Preventive care refers to lifestyle choices and visits with your health care provider that can promote health and wellness. Preventive care visits are also called wellness exams. ?What can I expect for my preventive care visit? ?Counseling ?During your preventive care visit, your health care provider may ask about your: ?Medical history, including: ?Past medical problems. ?Family medical history. ?History of falls. ?Current health, including: ?Emotional well-being. ?Home life and relationship well-being. ?Sexual activity. ?Memory and ability to understand (cognition). ?Lifestyle, including: ?Alcohol, nicotine or tobacco, and drug use. ?Access to firearms. ?Diet, exercise, and sleep habits. ?Work and work Astronomer. ?Sunscreen use. ?Safety issues such as seatbelt and bike helmet use. ?Physical exam ?Your health care provider will check your: ?Height and weight. These may be used to calculate your BMI (body mass index). BMI is a measurement that tells if you are at a healthy weight. ?Waist circumference. This measures the distance around your waistline. This measurement also tells if you are at a healthy weight and may help predict your risk of certain diseases, such as type 2 diabetes and high blood pressure. ?Heart rate and blood pressure. ?Body temperature. ?Skin for abnormal spots. ?What immunizations do I need? ?Vaccines are usually given at various ages, according to a schedule. Your health care provider will recommend vaccines for you based on your age, medical history, and lifestyle or other factors, such as travel or where you work. ?What tests do I need? ?Screening ?Your health care provider may recommend screening tests for certain conditions. This may include: ?Lipid and cholesterol levels. ?Diabetes screening. This is done by checking your blood sugar (glucose) after you have not eaten for a  while (fasting). ?Hepatitis C test. ?Hepatitis B test. ?HIV (human immunodeficiency virus) test. ?STI (sexually transmitted infection) testing, if you are at risk. ?Lung cancer screening. ?Colorectal cancer screening. ?Prostate cancer screening. ?Abdominal aortic aneurysm (AAA) screening. You may need this if you are a current or former smoker. ?Talk with your health care provider about your test results, treatment options, and if necessary, the need for more tests. ?Follow these instructions at home: ?Eating and drinking ? ?Eat a diet that includes fresh fruits and vegetables, whole grains, lean protein, and low-fat dairy products. Limit your intake of foods with high amounts of sugar, saturated fats, and salt. ?Take vitamin and mineral supplements as recommended by your health care provider. ?Do not drink alcohol if your health care provider tells you not to drink. ?If you drink alcohol: ?Limit how much you have to 0-2 drinks a day. ?Know how much alcohol is in your drink. In the [place] one drink equals one 12 oz bottle of beer (355 mL), one 5 oz glass of wine (148 mL), or one 1? oz glass of hard liquor (44 mL). ?Lifestyle ?Brush your teeth every morning and night with fluoride toothpaste. Floss one time each day. ?Exercise for at least 30 minutes 5 or more days each week. ?Do not use any products that contain nicotine or tobacco. These products include cigarettes, chewing tobacco, and vaping devices, such as e-cigarettes. If you need help quitting, ask your health care provider. ?Do not use drugs. ?If you are sexually active, practice safe sex. Use a condom or other form of protection to prevent STIs. ?Take aspirin only as told by your health care provider. Make sure that you understand how much to take and what form to take. Work  with your health care provider to find out whether it is safe and beneficial for you to take aspirin daily. ?Ask your health care provider if you need to take a cholesterol-lowering  medicine (statin). ?Find healthy ways to manage stress, such as: ?Meditation, yoga, or listening to music. ?Journaling. ?Talking to a trusted person. ?Spending time with friends and family. ?Safety ?Always wear your seat belt while driving or riding in a vehicle. ?Do not drive: ?If you have been drinking alcohol. Do not ride with someone who has been drinking. ?When you are tired or distracted. ?While texting. ?If you have been using any mind-altering substances or drugs. ?Wear a helmet and other protective equipment during sports activities. ?If you have firearms in your house, make sure you follow all gun safety procedures. ?Minimize exposure to UV radiation to reduce your risk of skin cancer. ?What's next? ?Visit your health care provider once a year for an annual wellness visit. ?Ask your health care provider how often you should have your eyes and teeth checked. ?Stay up to date on all vaccines. ?This information is not intended to replace advice given to you by your health care provider. Make sure you discuss any questions you have with your health care provider. ?Document Revised: 05/24/2021 Document Reviewed: 05/24/2021 ?Elsevier Patient Education ? 2022 [employer] ? ?

## 2022-02-26 NOTE — Progress Notes (Signed)
? ?Subjective:  ? Joshua [name] is a 86 y.o. male who presents for Medicare Annual/Subsequent preventive examination. ? ?Review of Systems    ?General:   No F/C, wt loss ?Pulm:   No DIB, SOB, pleuritic chest pain ?Card:  No CP, palpitations ?Abd:  No n/v/d or pain ?Ext:  No inc edema from baseline ? ?   ?Objective:  ?  ?Today's Vitals  ? 02/26/22 1026 02/26/22 1110  ?BP: (!) 150/77 (!) 150/71  ?Pulse: 90   ?Temp: 97.9 ?F (36.6 ?C)   ?SpO2: 96%   ?Weight: 196 lb (88.9 kg)   ?Height: 6' (1.829 m)   ? ?Body mass index is 26.58 kg/m?. ? ?Advanced Directives 09/28/2021 09/23/2021 07/04/2021 07/14/2020 07/01/2020 06/01/2019 05/27/2019  ?Does Patient Have a Medical Advance Directive? No Yes No Yes No Yes Yes  ?Type of Advance Directive - Living will - Healthcare Power of [place];Living will - Living will Living will  ?Does patient want to make changes to medical advance directive? - No - Patient declined - - - No - Patient declined No - Patient declined  ?Copy of Healthcare Power of Attorney in Chart? - - - - - - -  ?Would patient like information on creating a medical advance directive? No - Patient declined - No - Patient declined - No - Patient declined - -  ? ? ?Current Medications (verified) ?Outpatient Encounter Medications as of 02/26/2022  ?Medication Sig  ? ACCU-CHEK AVIVA PLUS test strip 1 each daily.  ? acetaminophen (TYLENOL) 500 MG tablet Take 2 tablets (1,000 mg total) by mouth every 6 (six) hours as needed.  ? albuterol (PROAIR HFA) 108 (90 Base) MCG/ACT inhaler Inhale 2 puffs into the lungs every 4 (four) hours as needed for wheezing or shortness of breath.  ? amLODipine (NORVASC) 10 MG tablet TAKE 1 TABLET BY MOUTH DAILY  ? gabapentin (NEURONTIN) 100 MG capsule Take 1 capsule (100 mg total) by mouth 3 (three) times daily.  ? glipiZIDE (GLUCOTROL XL) 10 MG 24 hr tablet Take 0.5 tablet by mouth daily with breaskfast.  ? sodium chloride (OCEAN) 0.65 % SOLN nasal spray Place 1 spray into both nostrils as needed  for congestion.  ? valsartan (DIOVAN) 40 MG tablet Take 1 tablet (40 mg total) by mouth daily.  ? [DISCONTINUED] empagliflozin (JARDIANCE) 10 MG TABS tablet Take 10 mg by mouth daily.  ? [DISCONTINUED] Fluticasone-Umeclidin-Vilant (TRELEGY ELLIPTA) 100-62.5-25 MCG/ACT AEPB Inhale 1 puff into the lungs daily.  ? [DISCONTINUED] gabapentin (NEURONTIN) 100 MG capsule Take 1 capsule (100 mg total) by mouth 3 (three) times daily. 100 mg TID x 2 weeks, then 100 mg BID x 2 weeks, then 100 mg QD x 2 weeks. (Patient taking differently: Take 100-300 mg by mouth every 8 (eight) hours as needed (pain).)  ? [DISCONTINUED] polyethylene glycol powder (GLYCOLAX/MIRALAX) 17 GM/SCOOP powder Take 17 g by mouth daily.  ? [DISCONTINUED] predniSONE (DELTASONE) 20 MG tablet Take 1 tablet (20 mg total) by mouth daily as needed.  ? [DISCONTINUED] traMADol (ULTRAM) 50 MG tablet Take 1 tablet (50 mg total) by mouth every 6 (six) hours as needed for moderate pain.  ? ?No facility-administered encounter medications on file as of 02/26/2022.  ? ? ?Allergies (verified) ?Patient has no known allergies.  ? ?History: ?Past Medical History:  ?Diagnosis Date  ? Anemia   ? hx.  ? Arthritis   ? COPD (chronic obstructive pulmonary disease) (HCC)   ? reports at one poin he had copd but he  has stopped smoking and now doesnt have any trouble breathing or  need an inhaler   ? Diabetes mellitus without complication (HCC)   ? type 2  ? Headache   ? none recently   ? High cholesterol   ? History of kidney stones   ? HOH (hard of hearing)   ? both ears  ? Hypertension   ? Pneumonia   ? years ago  ? ?Past Surgical History:  ?Procedure Laterality Date  ? ANKLE FRACTURE SURGERY Left   ? when [age]- years old  ? ANTERIOR CERVICAL DECOMP/DISCECTOMY FUSION N/A 07/11/2017  ? Procedure: Anterior Cervical Decompression/Discectomy Fusion  - Cervical three-four;  Surgeon: Donalee Citrin, MD;  Location: [facility] OR;  Service: Neurosurgery;  Laterality: N/A; limited extension of neck  currently   ? CATARACT EXTRACTION Bilateral   ? CHOLECYSTECTOMY N/A 09/24/2021  ? Procedure: LAPAROSCOPIC CHOLECYSTECTOMY;  Surgeon: Fritzi Mandes, MD;  Location: [facility] OR;  Service: General;  Laterality: N/A;  ? EXTRACORPOREAL SHOCK WAVE LITHOTRIPSY Right 07/14/2020  ? Procedure: EXTRACORPOREAL SHOCK WAVE LITHOTRIPSY (ESWL);  Surgeon: Bjorn Pippin, MD;  Location: [facility];  Service: Urology;  Laterality: Right;  ? FRACTURE SURGERY    ?  fx. lower left leg at age 23-13  ? TEE WITHOUT CARDIOVERSION N/A 09/28/2021  ? Procedure: TRANSESOPHAGEAL ECHOCARDIOGRAM (TEE);  Surgeon: Tessa Lerner, DO;  Location: [facility];  Service: Cardiovascular;  Laterality: N/A;  ? TOTAL KNEE ARTHROPLASTY Right 11/11/2017  ? Procedure: RIGHT TOTAL KNEE ARTHROPLASTY;  Surgeon: Ollen Gross, MD;  Location: [facility] ORS;  Service: Orthopedics;  Laterality: Right;  ? TOTAL KNEE ARTHROPLASTY Left 06/01/2019  ? Procedure: LEFT TOTAL KNEE ARTHROPLASTY;  Surgeon: Ollen Gross, MD;  Location: [facility];  Service: Orthopedics;  Laterality: Left;   ? ?Family History  ?Problem Relation Age of Onset  ? Diabetes Father   ? Cancer Father   ? Alcohol abuse Father   ? Heart attack Father   ? Strabismus Daughter   ? ?Social History  ? ?Socioeconomic History  ? Marital status: Married  ?  Spouse name: Not on file  ? Number of children: Not on file  ? Years of education: Not on file  ? Highest education level: Not on file  ?Occupational History  ? Not on file  ?Tobacco Use  ? Smoking status: Former  ?  Packs/day: 2.00  ?  Years: 60.00  ?  Pack years: 120.00  ?  Types: Cigarettes  ?  Quit date: 07/11/2011  ?  Years since quitting: 10.6  ? Smokeless tobacco: Never  ?Vaping Use  ? Vaping Use: Never used  ?Substance and Sexual Activity  ? Alcohol use: Never  ? Drug use: Never  ? Sexual activity: Not Currently  ?Other Topics Concern  ? Not on file  ?Social History Narrative  ? Not on file  ? ?Social Determinants of Health  ? ?Financial Resource  Strain: Not on file  ?Food Insecurity: Not on file  ?Transportation Needs: Not on file  ?Physical Activity: Not on file  ?Stress: Not on file  ?Social Connections: Not on file  ? ? ?Tobacco Counseling ?Counseling given: Not Answered ? ? ?Diabetic? yes ? ?  ? ?  ? ? ?Activities of Daily Living ?In your present state of health, do you have any difficulty performing the following activities: 02/26/2022 10/05/2021  ?Hearing? Y Y  ?Vision? Y Y  ?Difficulty concentrating or making decisions? Y N  ?Walking or climbing stairs? N Y  ?Dressing  or bathing? N Y  ?Doing errands, shopping? N Y  ?Some recent data might be hidden  ? ? ?Patient Care Team: ?Peggye FothergillAbonza, Cassidy Tabet, PA-C as PCP - General (Physician Assistant) ?Maisie FusBranch, Mary E, MD as PCP - Cardiology (Cardiology) ? ?Indicate any recent Medical Services you may have received from other than [facility] providers in the past year (date may be approximate). ? ?   ?Assessment:  ? This is a routine wellness examination for Joshua [name]. ? ?Hearing/Vision screen ?No results found. ? ?Dietary issues and exercise activities discussed: ?-Heart healthy diet low in fat and carbohydrates. ? ? Goals Addressed   ?None ?  ?Depression Screen ?PHQ 2/9 Scores 02/26/2022 10/05/2021  ?PHQ - 2 Score 1 0  ?PHQ- 9 Score 4 0  ?  ?Fall Risk ?Fall Risk  02/26/2022 10/05/2021  ?Falls in the past year? 1 1  ?Number falls in past yr: 0 1  ?Injury with Fall? 0 1  ?Risk for fall due to : No Fall Risks History of fall(s);Impaired balance/gait;Impaired mobility  ?Follow up Falls evaluation completed Falls evaluation completed  ? ? ?FALL RISK PREVENTION PERTAINING TO THE HOME: ? ?Any stairs in or around the home? Yes  ?If so, are there any without handrails? No  ?Home free of loose throw rugs in walkways, pet beds, electrical cords, etc? Yes  ?Adequate lighting in your home to reduce risk of falls? Yes  ? ?ASSISTIVE DEVICES UTILIZED TO PREVENT FALLS: ? ?Life alert? No  ?Use of a cane, walker or w/c? No  ?Grab bars in the  bathroom? Yes  ?Shower chair or bench in shower? Yes  ?Elevated toilet seat or a handicapped toilet? Yes  ? ?TIMED UP AND GO: ? ?Was the test performed? Yes .  ?Length of time to ambulate 10 feet: 15 sec.  ? ?Gait sl

## 2022-02-27 ENCOUNTER — Telehealth: Payer: Self-pay | Admitting: Physician Assistant

## 2022-02-27 LAB — CBC WITH DIFFERENTIAL/PLATELET
Basophils Absolute: 0 10*3/uL (ref 0.0–0.2)
Basos: 1 %
EOS (ABSOLUTE): 0.5 10*3/uL — ABNORMAL HIGH (ref 0.0–0.4)
Eos: 7 %
Hematocrit: 43 % (ref 37.5–51.0)
Hemoglobin: 14.3 g/dL (ref 13.0–17.7)
Immature Grans (Abs): 0 10*3/uL (ref 0.0–0.1)
Immature Granulocytes: 0 %
Lymphocytes Absolute: 1.1 10*3/uL (ref 0.7–3.1)
Lymphs: 14 %
MCH: 29.9 pg (ref 26.6–33.0)
MCHC: 33.3 g/dL (ref 31.5–35.7)
MCV: 90 fL (ref 79–97)
Monocytes Absolute: 0.6 10*3/uL (ref 0.1–0.9)
Monocytes: 8 %
Neutrophils Absolute: 5.3 10*3/uL (ref 1.4–7.0)
Neutrophils: 70 %
Platelets: 104 10*3/uL — ABNORMAL LOW (ref 150–450)
RBC: 4.78 x10E6/uL (ref 4.14–5.80)
RDW: 13 % (ref 11.6–15.4)
WBC: 7.5 10*3/uL (ref 3.4–10.8)

## 2022-02-27 LAB — COMPREHENSIVE METABOLIC PANEL
ALT: 8 IU/L (ref 0–44)
AST: 8 IU/L (ref 0–40)
Albumin/Globulin Ratio: 1.8 (ref 1.2–2.2)
Albumin: 4.2 g/dL (ref 3.6–4.6)
Alkaline Phosphatase: 72 IU/L (ref 44–121)
BUN/Creatinine Ratio: 14 (ref 10–24)
BUN: 15 mg/dL (ref 8–27)
Bilirubin Total: 0.4 mg/dL (ref 0.0–1.2)
CO2: 26 mmol/L (ref 20–29)
Calcium: 9.7 mg/dL (ref 8.6–10.2)
Chloride: 104 mmol/L (ref 96–106)
Creatinine, Ser: 1.09 mg/dL (ref 0.76–1.27)
Globulin, Total: 2.4 g/dL (ref 1.5–4.5)
Glucose: 142 mg/dL — ABNORMAL HIGH (ref 70–99)
Potassium: 4.1 mmol/L (ref 3.5–5.2)
Sodium: 143 mmol/L (ref 134–144)
Total Protein: 6.6 g/dL (ref 6.0–8.5)
eGFR: 67 mL/min/{1.73_m2} (ref >59)

## 2022-02-27 LAB — LIPID PANEL
Chol/HDL Ratio: 3.5 ratio (ref 0.0–5.0)
Cholesterol, Total: 183 mg/dL (ref 100–199)
HDL: 53 mg/dL (ref >39)
LDL Chol Calc (NIH): 113 mg/dL — ABNORMAL HIGH (ref 0–99)
Triglycerides: 96 mg/dL (ref 0–149)
VLDL Cholesterol Cal: 17 mg/dL (ref 5–40)

## 2022-02-27 LAB — TSH: TSH: 1.28 u[IU]/mL (ref 0.450–4.500)

## 2022-02-27 NOTE — Telephone Encounter (Signed)
lmtc

## 2022-03-01 NOTE — Progress Notes (Signed)
?[facility] - Clinic Note ? ?03/05/2022 ? ?  ? ?CHIEF COMPLAINT ?Patient presents for Retina Follow Up ? ? ?HISTORY OF PRESENT ILLNESS: ?Joshua [name] is a 86 y.o. male who presents to the clinic today for:  ? ?HPI   ? ? Retina Follow Up   ?Patient presents with  Other.  In both eyes.  Since onset it is gradually worsening.  I, the attending physician,  performed the HPI with the patient and updated documentation appropriately. ? ?  ?  ? ? Comments   ?Lattice Degeneration OU follow up- When he comes in from outside, he can't see well.  At night he also doesn't see well.   ?He fell a month ago and hit a Training and development officer in the yard.  It hit him around his temple on left side.  He did not go to the doctor.  OS has been tearing ever since.  ?BS 115 yesterday  A1C 6 something ? ?  ?  ?Last edited by Rennis Chris, MD on 03/05/2022  1:52 PM.  ?  ?Pt states vision has decreased, pt states he fell in the yard and hit a landscaping timber on the left side of his face, he states he did not get a black eye, but his left eye has been tearing ever since then, has a hard time seeing at night ? ?Pt states that he stopped using AREDS 2 vitamins ? ?Referring physician: ?Kaleen Mask, MD ?[address] ?[place],  [place] [address] ? ?HISTORICAL INFORMATION:  ? 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 ?CURRENT MEDICATIONS: ?No current outpatient medications on file. (Ophthalmic Drugs)  ? ?No current facility-administered medications for this visit. (Ophthalmic Drugs)  ? ?Current Outpatient Medications (Other)  ?Medication Sig  ? acetaminophen (TYLENOL) 500 MG tablet Take 2 tablets (1,000 mg total) by mouth every 6 (six) hours as needed.  ? albuterol (PROAIR HFA) 108 (90 Base) MCG/ACT inhaler Inhale 2 puffs into the lungs every 4 (four) hours as needed for wheezing or shortness of breath.  ? amLODipine (NORVASC) 10 MG tablet TAKE 1 TABLET  BY MOUTH DAILY  ? glipiZIDE (GLUCOTROL XL) 10 MG 24 hr tablet Take 0.5 tablet by mouth daily with breaskfast.  ? sodium chloride (OCEAN) 0.65 % SOLN nasal spray Place 1 spray into both nostrils as needed for congestion.  ? valsartan (DIOVAN) 40 MG tablet Take 1 tablet (40 mg total) by mouth daily.  ? ACCU-CHEK AVIVA PLUS test strip 1 each daily.  ? gabapentin (NEURONTIN) 100 MG capsule Take 1 capsule (100 mg total) by mouth 3 (three) times daily. (Patient not taking: Reported on 03/05/2022)  ? ?No current facility-administered medications for this visit. (Other)  ? ?REVIEW OF SYSTEMS: ?ROS   ?Positive for: Musculoskeletal, Endocrine, Eyes, Allergic/Imm ?Negative for: Constitutional, Gastrointestinal, Neurological, Skin, Genitourinary, HENT, Cardiovascular, Respiratory, Psychiatric, Heme/Lymph ?Last edited by Joni Reining, COA on 03/05/2022  9:51 AM.  ?  ? ?ALLERGIES ?No Known Allergies ? ?PAST MEDICAL HISTORY ?Past Medical History:  ?Diagnosis Date  ? Anemia   ? hx.  ? Arthritis   ? COPD (chronic obstructive pulmonary disease) (HCC)   ? reports at one poin he had copd but he has stopped smoking and now doesnt have any trouble breathing or  need an inhaler   ? Diabetes mellitus without complication (HCC)   ? type 2  ? Headache   ? none  recently   ? High cholesterol   ? History of kidney stones   ? HOH (hard of hearing)   ? both ears  ? Hypertension   ? Pneumonia   ? years ago  ? ?Past Surgical History:  ?Procedure Laterality Date  ? ANKLE FRACTURE SURGERY Left   ? when [age]- years old  ? ANTERIOR CERVICAL DECOMP/DISCECTOMY FUSION N/A 07/11/2017  ? Procedure: Anterior Cervical Decompression/Discectomy Fusion  - Cervical three-four;  Surgeon: Donalee Citrin, MD;  Location: [facility] OR;  Service: Neurosurgery;  Laterality: N/A; limited extension of neck currently   ? CATARACT EXTRACTION Bilateral   ? CHOLECYSTECTOMY N/A 09/24/2021  ? Procedure: LAPAROSCOPIC CHOLECYSTECTOMY;  Surgeon: Fritzi Mandes, MD;  Location: [facility] OR;  Service:  General;  Laterality: N/A;  ? EXTRACORPOREAL SHOCK WAVE LITHOTRIPSY Right 07/14/2020  ? Procedure: EXTRACORPOREAL SHOCK WAVE LITHOTRIPSY (ESWL);  Surgeon: Bjorn Pippin, MD;  Location: [facility];  Service: Urology;  Laterality: Right;  ? FRACTURE SURGERY    ?  fx. lower left leg at age 32-13  ? TEE WITHOUT CARDIOVERSION N/A 09/28/2021  ? Procedure: TRANSESOPHAGEAL ECHOCARDIOGRAM (TEE);  Surgeon: Tessa Lerner, DO;  Location: [facility];  Service: Cardiovascular;  Laterality: N/A;  ? TOTAL KNEE ARTHROPLASTY Right 11/11/2017  ? Procedure: RIGHT TOTAL KNEE ARTHROPLASTY;  Surgeon: Ollen Gross, MD;  Location: [facility];  Service: Orthopedics;  Laterality: Right;  ? TOTAL KNEE ARTHROPLASTY Left 06/01/2019  ? Procedure: LEFT TOTAL KNEE ARTHROPLASTY;  Surgeon: Ollen Gross, MD;  Location: [facility];  Service: Orthopedics;  Laterality: Left;   ? ?FAMILY HISTORY ?Family History  ?Problem Relation Age of Onset  ? Diabetes Father   ? Cancer Father   ? Alcohol abuse Father   ? Heart attack Father   ? Strabismus Daughter   ? ?SOCIAL HISTORY ?Social History  ? ?Tobacco Use  ? Smoking status: Former  ?  Packs/day: 2.00  ?  Years: 60.00  ?  Pack years: 120.00  ?  Types: Cigarettes  ?  Quit date: 07/11/2011  ?  Years since quitting: 10.6  ? Smokeless tobacco: Never  ?Vaping Use  ? Vaping Use: Never used  ?Substance Use Topics  ? Alcohol use: Never  ? Drug use: Never  ?  ? ?  ?OPHTHALMIC EXAM: ? ?Base Eye Exam   ? ? Visual Acuity (Snellen - Linear)   ? ?   Right Left  ? Dist cc 20/40 20/50  ? Dist ph cc NI NI  ? ?  ?  ? ? Tonometry (Tonopen, 10:06 AM)   ? ?   Right Left  ? Pressure 17 14  ? ?  ?  ? ? Pupils   ? ?   Dark Light Shape React APD  ? Right 4 3 Round Brisk None  ? Left 4 3 Round Brisk None  ? ?  ?  ? ? Visual Fields (Counting fingers)   ? ?   Left Right  ?  Full Full  ? ?  ?  ? ? Extraocular Movement   ? ?   Right Left  ?  Full Full  ? ?  ?  ? ? Neuro/Psych   ? ? Oriented x3: Yes  ? Mood/Affect: Normal  ? ?  ?   ? ? Dilation   ? ? Both eyes: 1.0% Mydriacyl, 2.5% Phenylephrine @ 10:06 AM  ? ?  ?  ? ?  ? ?Slit Lamp and Fundus Exam   ? ? Slit Lamp  Exam   ? ?   Right Left  ? Lids/Lashes Dermatochalasis - upper lid Dermatochalasis - upper lid, Meibomian gland dysfunction  ? Conjunctiva/Sclera nasal and temporal Pinguecula temporal Pinguecula  ? Cornea subepithelial scarring inferior paracentral, Arcus, trace Punctate epithelial erosions Round subepithelial scarring nasal paracentral, Arcus, trace PEE  ? Anterior Chamber Deep and quiet Deep and quiet  ? Iris Round and dilated Round and dilated  ? Lens Three piece Posterior chamber intraocular lens in good position, Open posterior capsule Three piece Posterior chamber intraocular lens in good position with open PC  ? Anterior Vitreous Vitreous syneresis, Posterior vitreous detachment Vitreous syneresis, Posterior vitreous detachment  ? ?  ?  ? ? Fundus Exam   ? ?   Right Left  ? Disc Pink and Sharp, trace Pallor mild Pallor, Sharp rim  ? C/D Ratio 0.5 0.5  ? Macula Flat, Blunted foveal reflex, +Drusen, RPE mottling and clumping, No heme or edema Flat, blunted foveal reflex, mild Drusen, no heme or edema, Retinal pigment epithelial mottling  ? Vessels Vascular attenuation, Tortuous, mild AV crossing changes Vascular attenuation, mild Tortuousity, mild AV crossing changes  ? Periphery attached, Superior pigmented lattice degeneration with excellent laser surrounding attached, Superior pigmented lattice degeneration with small atrophic holes - excellent laser surrounding  ? ?  ?  ? ?  ? ?Refraction   ? ? Wearing Rx   ? ?   Sphere Cylinder Axis Add  ? Right -0.25 Sphere  +2.50  ? Left -0.50 +0.50 140 +2.50  ? ?  ?  ? ? Manifest Refraction   ? ?   Sphere Cylinder Axis Dist VA  ? Right -1.25 +0.75 015 20/25  ? Left -1.75 +0.75 140 20/40+2  ? ?  ?  ? ?  ? ?IMAGING AND PROCEDURES  ?Imaging and Procedures for 01/07/18 ? ?OCT, Retina - OU - Both Eyes   ? ?   ?Right Eye ?Quality was good.  Central Foveal Thickness: 252. Progression has worsened. Findings include normal foveal contour, no IRF, no SRF, retinal drusen (Mild progression of drusen, patchy ORA).  ? ?Left Eye ?Quality was good

## 2022-03-05 ENCOUNTER — Ambulatory Visit (INDEPENDENT_AMBULATORY_CARE_PROVIDER_SITE_OTHER): Payer: Medicare Other | Admitting: Ophthalmology

## 2022-03-05 ENCOUNTER — Other Ambulatory Visit: Payer: Self-pay

## 2022-03-05 ENCOUNTER — Encounter (INDEPENDENT_AMBULATORY_CARE_PROVIDER_SITE_OTHER): Payer: Self-pay | Admitting: Ophthalmology

## 2022-03-05 DIAGNOSIS — H353132 Nonexudative age-related macular degeneration, bilateral, intermediate dry stage: Secondary | ICD-10-CM

## 2022-03-05 DIAGNOSIS — Z961 Presence of intraocular lens: Secondary | ICD-10-CM | POA: Diagnosis not present

## 2022-03-05 DIAGNOSIS — E119 Type 2 diabetes mellitus without complications: Secondary | ICD-10-CM

## 2022-03-05 DIAGNOSIS — H35413 Lattice degeneration of retina, bilateral: Secondary | ICD-10-CM | POA: Diagnosis not present

## 2022-03-05 DIAGNOSIS — H353131 Nonexudative age-related macular degeneration, bilateral, early dry stage: Secondary | ICD-10-CM

## 2022-03-12 ENCOUNTER — Other Ambulatory Visit: Payer: Medicare Other

## 2022-03-22 ENCOUNTER — Telehealth: Payer: Self-pay | Admitting: Physician Assistant

## 2022-03-22 NOTE — Telephone Encounter (Signed)
Spoke with insurance company and they will not cover CT due to patients age. Provider has been made aware. AS, CMA ?

## 2022-03-22 NOTE — Telephone Encounter (Signed)
Patient is scheduled April 17th for a CT lung cancer screening but is past the age for it. [facility] will change the order but needs you to cosign it for just a regular CT without contrast. Please advise. Fax [phone] ?

## 2022-03-26 ENCOUNTER — Ambulatory Visit
Admission: RE | Admit: 2022-03-26 | Discharge: 2022-03-26 | Disposition: A | Discharge status: Home / Self Care | Payer: Medicare Other | Source: Ambulatory Visit | Attending: Physician Assistant | Admitting: Physician Assistant

## 2022-03-26 DIAGNOSIS — Z87891 Personal history of nicotine dependence: Secondary | ICD-10-CM

## 2022-03-29 ENCOUNTER — Other Ambulatory Visit: Payer: Self-pay

## 2022-03-29 DIAGNOSIS — R599 Enlarged lymph nodes, unspecified: Secondary | ICD-10-CM

## 2022-04-05 ENCOUNTER — Other Ambulatory Visit: Payer: Self-pay | Admitting: Physician Assistant

## 2022-05-14 ENCOUNTER — Other Ambulatory Visit: Payer: Self-pay | Admitting: Physician Assistant

## 2022-05-17 ENCOUNTER — Other Ambulatory Visit: Payer: Self-pay | Admitting: Physician Assistant

## 2022-05-17 DIAGNOSIS — I10 Essential (primary) hypertension: Secondary | ICD-10-CM

## 2022-06-27 ENCOUNTER — Ambulatory Visit: Payer: Medicare Other | Admitting: Physician Assistant

## 2022-08-14 ENCOUNTER — Other Ambulatory Visit: Payer: Self-pay | Admitting: Physician Assistant

## 2022-08-14 DIAGNOSIS — I10 Essential (primary) hypertension: Secondary | ICD-10-CM

## 2022-10-18 ENCOUNTER — Ambulatory Visit (INDEPENDENT_AMBULATORY_CARE_PROVIDER_SITE_OTHER): Payer: Medicare Other | Admitting: Physician Assistant

## 2022-10-18 ENCOUNTER — Encounter: Payer: Self-pay | Admitting: Physician Assistant

## 2022-10-18 VITALS — BP 127/74 | HR 80 | Resp 18 | Ht 72.0 in | Wt 202.0 lb

## 2022-10-18 DIAGNOSIS — E119 Type 2 diabetes mellitus without complications: Secondary | ICD-10-CM | POA: Diagnosis not present

## 2022-10-18 DIAGNOSIS — E782 Mixed hyperlipidemia: Secondary | ICD-10-CM

## 2022-10-18 DIAGNOSIS — R8290 Unspecified abnormal findings in urine: Secondary | ICD-10-CM | POA: Diagnosis not present

## 2022-10-18 DIAGNOSIS — Z23 Encounter for immunization: Secondary | ICD-10-CM | POA: Diagnosis not present

## 2022-10-18 DIAGNOSIS — I10 Essential (primary) hypertension: Secondary | ICD-10-CM

## 2022-10-18 LAB — POCT GLYCOSYLATED HEMOGLOBIN (HGB A1C): HbA1c POC (<> result, manual entry): 7.3 % (ref 4.0–5.6)

## 2022-10-18 LAB — POCT URINALYSIS DIPSTICK
Bilirubin, UA: NEGATIVE
Blood, UA: NEGATIVE
Glucose, UA: NEGATIVE
Ketones, UA: NEGATIVE
Leukocytes, UA: NEGATIVE
Nitrite, UA: NEGATIVE
Protein, UA: POSITIVE — AB
Spec Grav, UA: 1.02 (ref 1.010–1.025)
Urobilinogen, UA: 0.2 E.U./dL
pH, UA: 5.5 (ref 5.0–8.0)

## 2022-10-18 MED ORDER — GLIPIZIDE ER 10 MG PO TB24: ORAL_TABLET | ORAL | 0 refills | Status: DC

## 2022-10-18 NOTE — Patient Instructions (Signed)

## 2022-10-18 NOTE — Assessment & Plan Note (Signed)
>>  ASSESSMENT AND PLAN FOR HYPERLIPIDEMIA WRITTEN ON 10/18/2022  4:18 PM BY ABONZA, MARITZA, PA-C  -Last lipid panel LDL 113, recommend repeating fasting lipid panel at follow-up visit. Recommend to follow a heart healthy diet low in fat.

## 2022-10-18 NOTE — Assessment & Plan Note (Addendum)
-  A1c has increased from 6.2 to 7.3, (goal <7.5). Discussed with patient reducing simple carbohydrates and sugar intake. Will continue glipizide 10 mg daily. Recommend monitoring fasting sugars at home. Recommend obtaining urine microalbumin at follow-up visit. Advised to follow-up in 3 months to repeat A1c and reassess medication therapy.

## 2022-10-18 NOTE — Assessment & Plan Note (Signed)
-  Stable. Continue Valsartan 40 mg daily and Amlodipine 10 mg daily. Recommend repeating CMP at follow-up visit.

## 2022-10-18 NOTE — Progress Notes (Signed)
Established patient visit   Patient: Joshua [name]   DOB: 03-07-1936   86 y.o. Male  MRN: [id number] Visit Date: 10/18/2022  Chief Complaint  Patient presents with   Follow-up   Hypertension   Diabetes   Subjective    HPI  Patient presents for chronic follow-up visit. Patient is accompanied by his wife who states she has noticed his urine looking cloudy. Patient denies dysuria, flank pain, urinary frequency, or lower abdominal pain.  Diabetes: Pt denies increased urination or thirst. Pt reports medication compliance. No hypoglycemic events. Not checking glucose at home. Patient reports eating more candy and snacks.    HTN: Pt denies chest pain, palpitations, dizziness or lower extremity swelling. Taking medication as directed without side effects.   HLD: Pt reports was previously on cholesterol medication which was discontinued by previous PCP because of his age.    Medications: Outpatient Medications Prior to Visit  Medication Sig   ACCU-CHEK AVIVA PLUS test strip 1 each daily.   acetaminophen (TYLENOL) 500 MG tablet Take 2 tablets (1,000 mg total) by mouth every 6 (six) hours as needed.   albuterol (PROAIR HFA) 108 (90 Base) MCG/ACT inhaler Inhale 2 puffs into the lungs every 4 (four) hours as needed for wheezing or shortness of breath.   amLODipine (NORVASC) 10 MG tablet TAKE 1 TABLET BY MOUTH DAILY   gabapentin (NEURONTIN) 100 MG capsule Take 1 capsule (100 mg total) by mouth 3 (three) times daily.   sodium chloride (OCEAN) 0.65 % SOLN nasal spray Place 1 spray into both nostrils as needed for congestion.   valsartan (DIOVAN) 40 MG tablet TAKE 1 TABLET BY MOUTH DAILY   [DISCONTINUED] glipiZIDE (GLUCOTROL XL) 10 MG 24 hr tablet TAKE 1 TABLET BY MOUTH DAILY WITH BREAKFAST   No facility-administered medications prior to visit.    Review of Systems Review of Systems:  A fourteen system review of systems was performed and found to be positive as per HPI.  Last CBC Lab  Results  Component Value Date   WBC 7.5 02/26/2022   HGB 14.3 02/26/2022   HCT 43.0 02/26/2022   MCV 90 02/26/2022   MCH 29.9 02/26/2022   RDW 13.0 02/26/2022   PLT 104 (L) 76/72/0947   Last metabolic panel Lab Results  Component Value Date   GLUCOSE 142 (H) 02/26/2022   NA 143 02/26/2022   K 4.1 02/26/2022   CL 104 02/26/2022   CO2 26 02/26/2022   BUN 15 02/26/2022   CREATININE 1.09 02/26/2022   EGFR 67 02/26/2022   CALCIUM 9.7 02/26/2022   PROT 6.6 02/26/2022   ALBUMIN 4.2 02/26/2022   LABGLOB 2.4 02/26/2022   AGRATIO 1.8 02/26/2022   BILITOT 0.4 02/26/2022   ALKPHOS 72 02/26/2022   AST 8 02/26/2022   ALT 8 02/26/2022   ANIONGAP 10 09/28/2021   Last lipids Lab Results  Component Value Date   CHOL 183 02/26/2022   HDL 53 02/26/2022   LDLCALC 113 (H) 02/26/2022   TRIG 96 02/26/2022   CHOLHDL 3.5 02/26/2022   Last hemoglobin A1c Lab Results  Component Value Date   HGBA1C 7.3 10/18/2022   Last thyroid functions Lab Results  Component Value Date   TSH 1.280 02/26/2022   Last vitamin D No results found for: "25OHVITD2", "25OHVITD3", "VD25OH"     Objective    BP 127/74 (BP Location: Left Arm, Patient Position: Sitting, Cuff Size: Normal)   Pulse 80   Resp 18   Ht 6' (1.829 m)  Wt 202 lb (91.6 kg)   SpO2 95%   BMI 27.40 kg/m  BP Readings from Last 3 Encounters:  10/18/22 127/74  02/26/22 (!) 150/71  11/20/21 128/67   Wt Readings from Last 3 Encounters:  10/18/22 202 lb (91.6 kg)  02/26/22 196 lb (88.9 kg)  11/20/21 215 lb (97.5 kg)    Physical Exam  General:  Well Developed, well nourished, appropriate for stated age.  Neuro:  Alert and oriented,  extra-ocular muscles intact  HEENT:  Normocephalic, atraumatic, neck supple  Skin:  no gross rash, warm, pink. Cardiac:  RRR, S1 S2 Respiratory: +wheezing, no crackles or rales. Vascular:  Ext warm, no cyanosis apprec.; cap RF less 2 sec. Psych:  No HI/SI, judgement and insight good, Euthymic  mood. Full Affect.   Results for orders placed or performed in visit on 10/18/22  POCT HgB A1C  Result Value Ref Range   Hemoglobin A1C     HbA1c POC (<> result, manual entry) 7.3 4.0 - 5.6 %   HbA1c, POC (prediabetic range)     HbA1c, POC (controlled diabetic range)    POCT Urinalysis Dipstick  Result Value Ref Range   Color, UA dark yellow    Clarity, UA clear    Glucose, UA Negative Negative   Bilirubin, UA n    Ketones, UA n    Spec Grav, UA 1.020 1.010 - 1.025   Blood, UA n    pH, UA 5.5 5.0 - 8.0   Protein, UA Positive (A) Negative   Urobilinogen, UA 0.2 0.2 or 1.0 E.U./dL   Nitrite, UA n    Leukocytes, UA Negative Negative   Appearance     Odor      Assessment & Plan      Problem List Items Addressed This Visit       Cardiovascular and Mediastinum   Essential hypertension    -Stable. Continue Valsartan 40 mg daily and Amlodipine 10 mg daily. Recommend repeating CMP at follow-up visit.        Endocrine   Non-insulin dependent type 2 diabetes mellitus ([place]) - Primary    -A1c has increased from 6.2 to 7.3, (goal <7.5). Discussed with patient reducing simple carbohydrates and sugar intake. Will continue glipizide 10 mg daily. Recommend monitoring fasting sugars at home. Recommend obtaining urine microalbumin at follow-up visit. Advised to follow-up in 3 months to repeat A1c and reassess medication therapy.      Relevant Medications   glipiZIDE (GLUCOTROL XL) 10 MG 24 hr tablet   Other Relevant Orders   POCT HgB A1C (Completed)     Other   Hyperlipidemia    -Last lipid panel LDL 113, recommend repeating fasting lipid panel at follow-up visit. Recommend to follow a heart healthy diet low in fat.      Other Visit Diagnoses     Need for influenza vaccination       Relevant Orders   Flu Vaccine QUAD High Dose(Fluad) (Completed)   Cloudy urine       Relevant Orders   POCT Urinalysis Dipstick (Completed)      Cloudy urine: -UA collected, negative for  nitrites and WBC. Recommend drinking more water and reducing sugary drinks. Recommend obtaining urine microalbumin at follow-up visit.  Return in about 3 months (around 01/18/2023) for DM, HTN, HLD and FBW.        Lorrene Reid, PA-C  [facility] [phone] (phone) [phone] (fax)  [place]

## 2022-10-18 NOTE — Assessment & Plan Note (Signed)
-  Last lipid panel LDL 113, recommend repeating fasting lipid panel at follow-up visit. Recommend to follow a heart healthy diet low in fat.

## 2022-10-18 NOTE — Assessment & Plan Note (Signed)
>>  ASSESSMENT AND PLAN FOR NON-INSULIN DEPENDENT TYPE 2 DIABETES MELLITUS ([place]) WRITTEN ON 10/18/2022  4:19 PM BY ABONZA, MARITZA, PA-C  -A1c has increased from 6.2 to 7.3, (goal <7.5). Discussed with patient reducing simple carbohydrates and sugar intake. Will continue glipizide 10 mg daily. Recommend monitoring fasting sugars at home. Recommend obtaining urine microalbumin at follow-up visit. Advised to follow-up in 3 months to repeat A1c and reassess medication therapy.

## 2022-10-18 NOTE — Assessment & Plan Note (Signed)
>>  ASSESSMENT AND PLAN FOR DIABETES MELLITUS TYPE 2 IN NONOBESE ([place]) WRITTEN ON 02/05/2023  4:37 PM BY Sabir Charters A, PA  >>ASSESSMENT AND PLAN FOR NON-INSULIN DEPENDENT TYPE 2 DIABETES MELLITUS ([place]) WRITTEN ON 10/18/2022  4:19 PM BY ABONZA, MARITZA, PA-C  -A1c has increased from 6.2 to 7.3, (goal <7.5). Discussed with patient reducing simple carbohydrates and sugar intake. Will continue glipizide 10 mg daily. Recommend monitoring fasting sugars at home. Recommend obtaining urine microalbumin at follow-up visit. Advised to follow-up in 3 months to repeat A1c and reassess medication therapy.

## 2022-10-25 ENCOUNTER — Other Ambulatory Visit (HOSPITAL_COMMUNITY): Payer: Self-pay

## 2022-10-28 IMAGING — DX DG LUMBAR SPINE 2-3V
3 series · 3 of 3 positions shown · non-contrast
Comparison: July 27, 2020.

CLINICAL DATA: Chronic low back pain.

EXAM:
LUMBAR SPINE - 2-3 VIEW

[dg lumbar spine 2-3 views (1 of 3)]
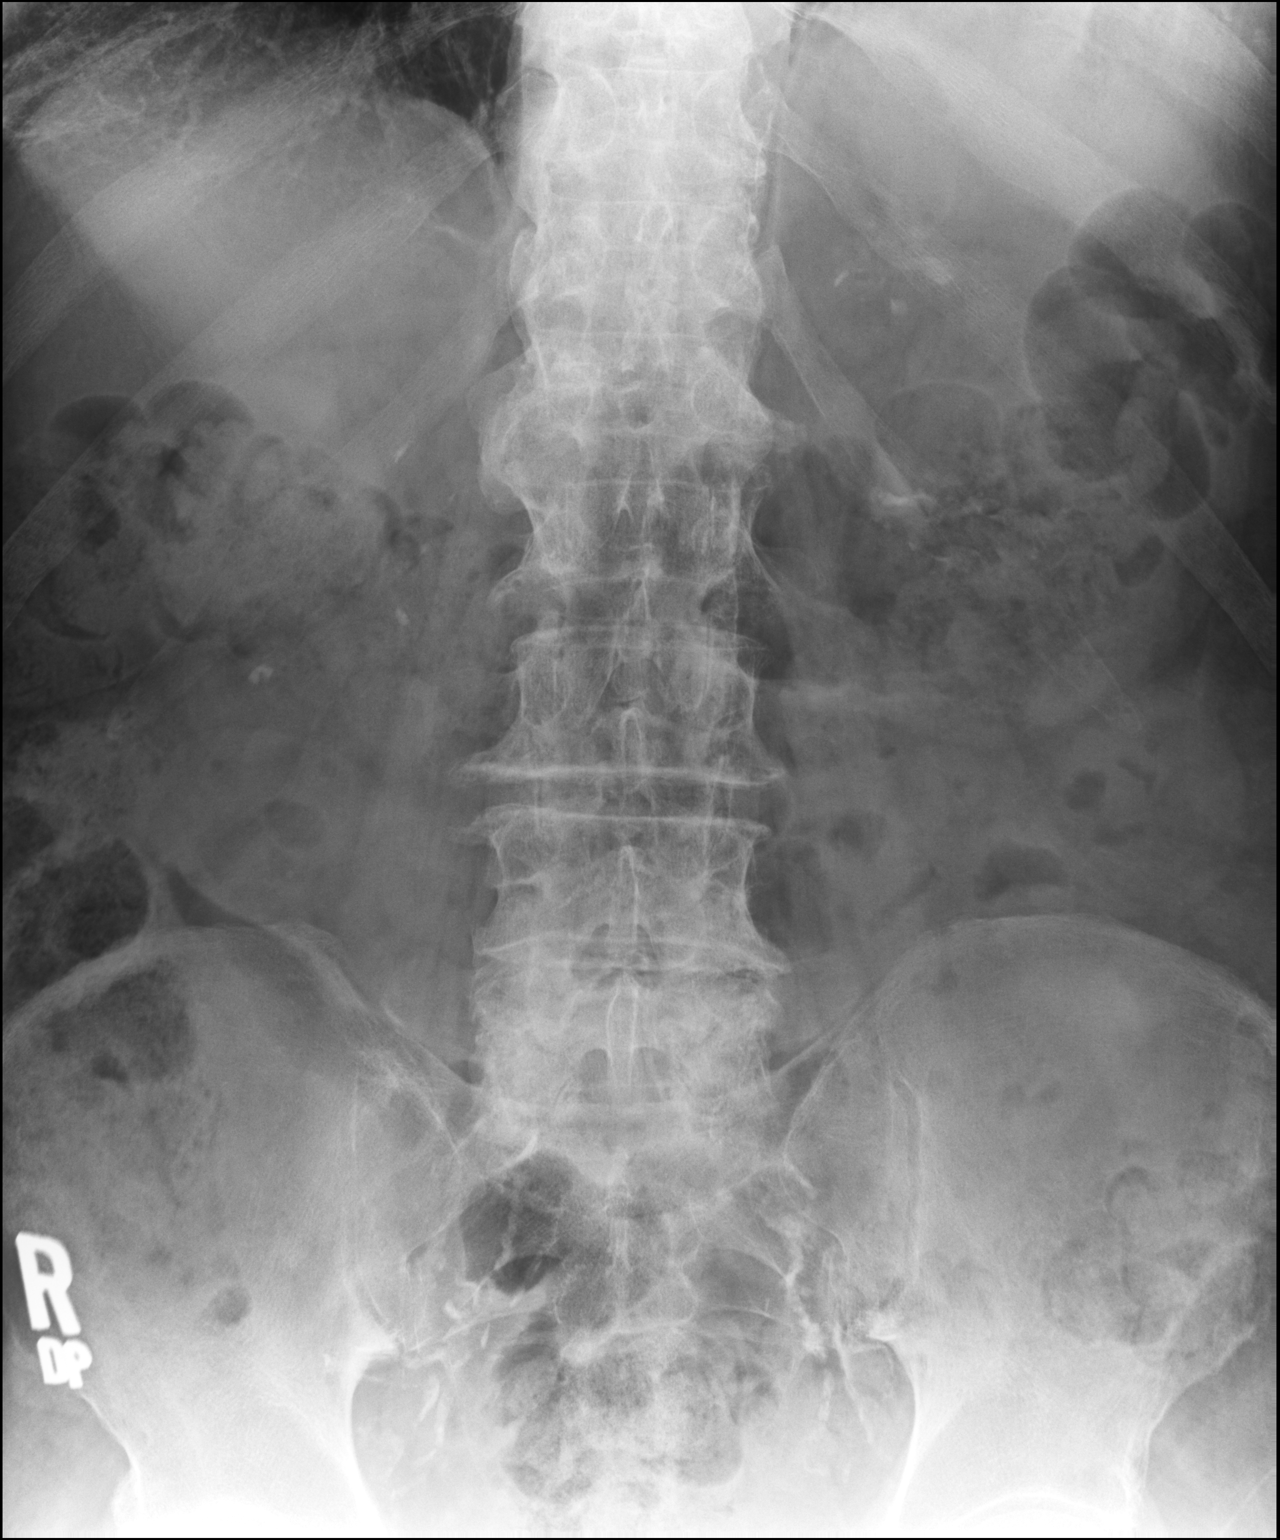

[dg lumbar spine 2-3 views (2 of 3)]
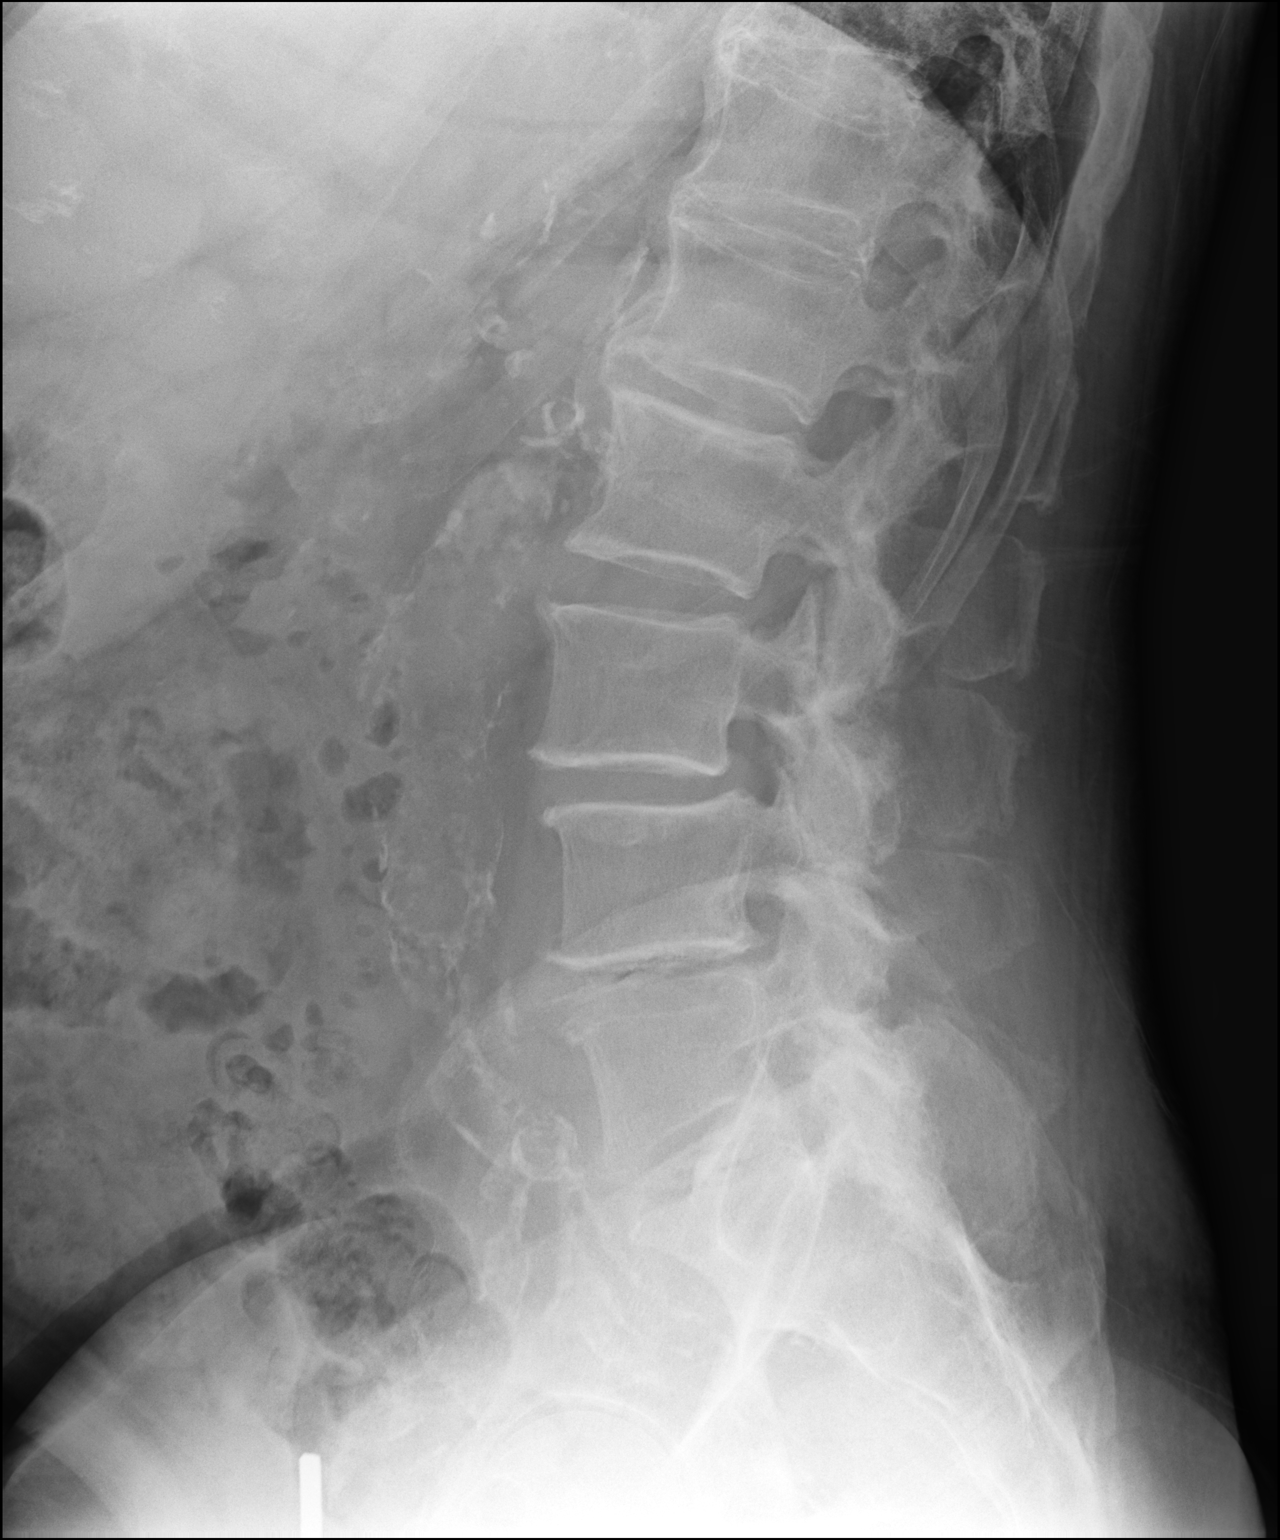

[dg lumbar spine 2-3 views (3 of 3)]
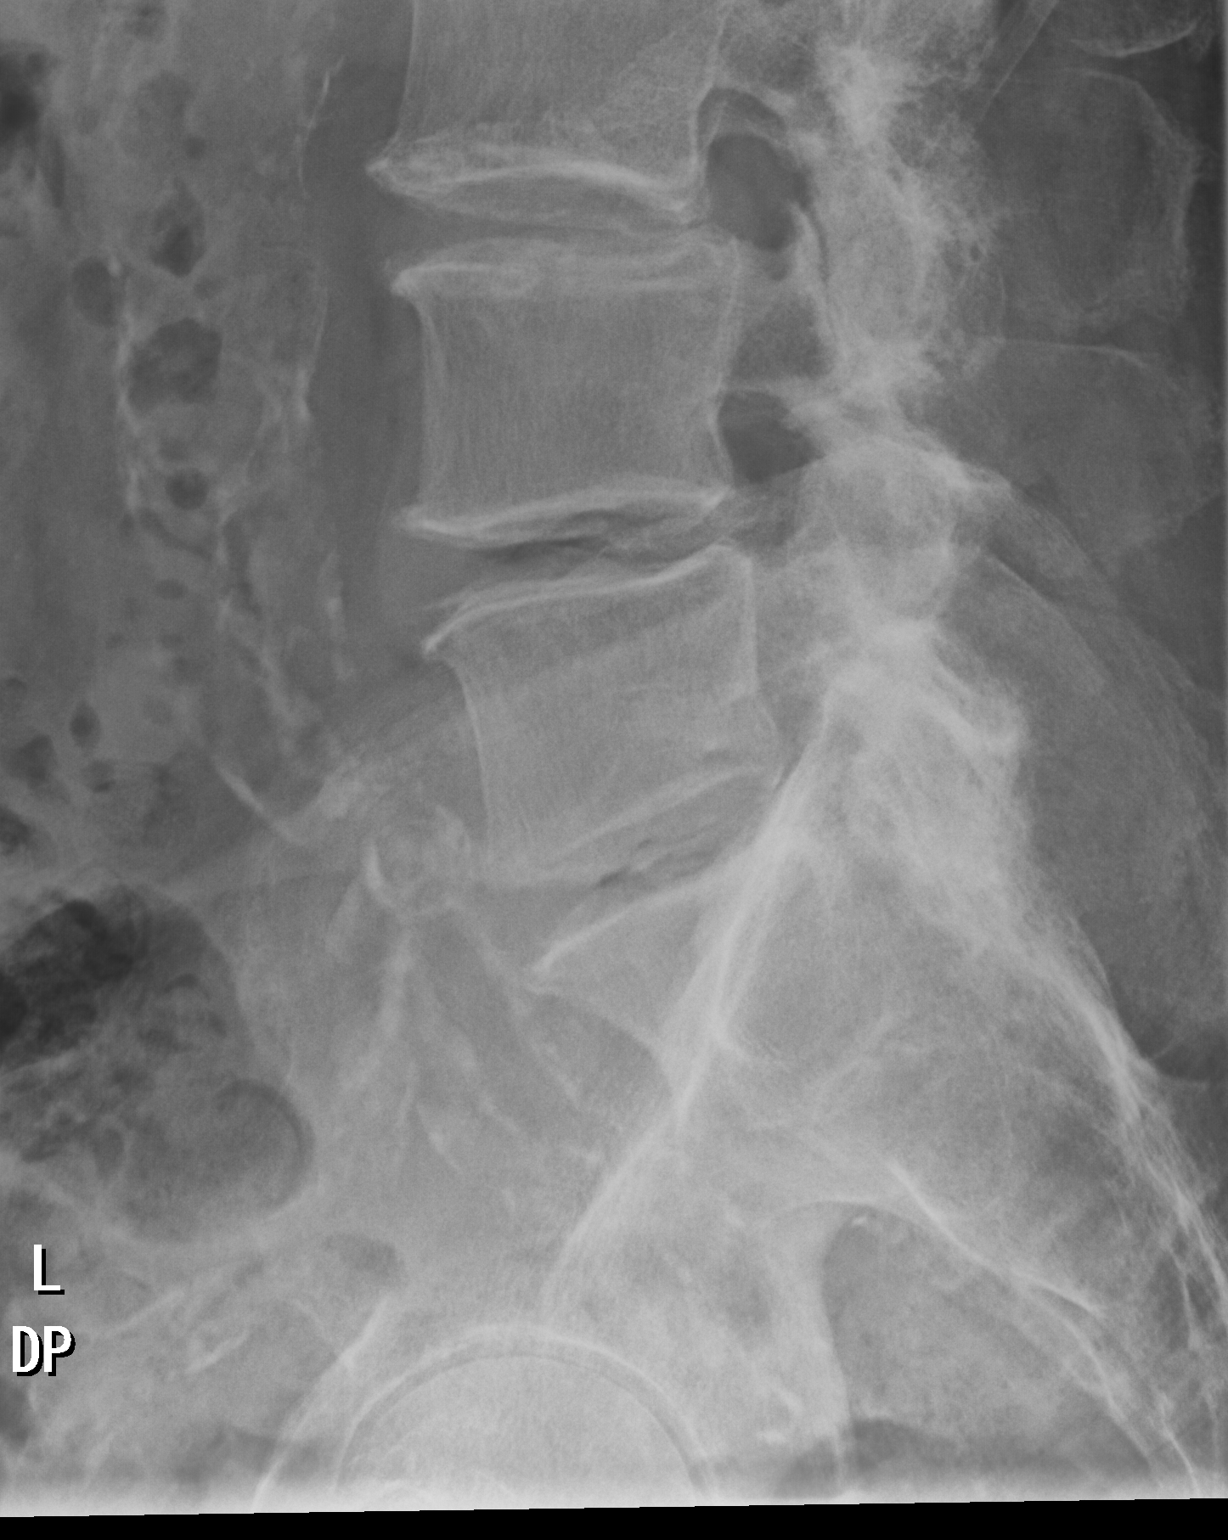

[3 of 3 positions shown; findings below may reference images not displayed]

FINDINGS: Minimal grade 1 anterolisthesis of L3-4 is noted secondary to
posterior facet joint hypertrophy. Moderate degenerative disc
disease is noted at L4-5 and L5-S1. Mild degenerative disc disease
is noted at L1-2. No fracture is noted. Probable calcified
infrarenal abdominal aortic aneurysm is noted.
IMPRESSION: 1. Multilevel degenerative disc disease. No acute abnormality seen
in the lumbar spine.
2.  Aortic aneurysm NOS ([id number]).

## 2022-10-31 NOTE — Progress Notes (Signed)
[facility] - Clinic Note  11/05/2022     CHIEF COMPLAINT Patient presents for Retina Follow Up   HISTORY OF PRESENT ILLNESS: Joshua [name] is a 86 y.o. male who presents to the clinic today for:   HPI     Retina Follow Up   Patient presents with  Dry AMD.  In both eyes.  Severity is moderate.  Duration of 8 months.  Since onset it is stable.  I, the attending physician,  performed the HPI with the patient and updated documentation appropriately.        Comments   Patient states having increasing difficulty with night vision, driving at night. Frequent watering OS. Watering causes blurry vision OS.      Last edited by Rennis Chris, MD on 11/05/2022  2:14 PM.    Pt states he has a hard time seeing at night, he is taking AREDS 2 BID, his left eye waters a lot, he does not use any drops  Referring physician: Mayer Masker, PA-C No address on file  HISTORICAL INFORMATION:   Selected notes from the MEDICAL RECORD NUMBER Referral from Dr. Eugene Garnet for concern of ARMD OU   CURRENT MEDICATIONS: No current outpatient medications on file. (Ophthalmic Drugs)   No current facility-administered medications for this visit. (Ophthalmic Drugs)   Current Outpatient Medications (Other)  Medication Sig   ACCU-CHEK AVIVA PLUS test strip 1 each daily.   acetaminophen (TYLENOL) 500 MG tablet Take 2 tablets (1,000 mg total) by mouth every 6 (six) hours as needed.   albuterol (PROAIR HFA) 108 (90 Base) MCG/ACT inhaler Inhale 2 puffs into the lungs every 4 (four) hours as needed for wheezing or shortness of breath.   amLODipine (NORVASC) 10 MG tablet TAKE 1 TABLET BY MOUTH DAILY   glipiZIDE (GLUCOTROL XL) 10 MG 24 hr tablet TAKE 1 TABLET BY MOUTH DAILY WITH BREAKFAST   sodium chloride (OCEAN) 0.65 % SOLN nasal spray Place 1 spray into both nostrils as needed for congestion.   valsartan (DIOVAN) 40 MG tablet TAKE 1 TABLET BY MOUTH DAILY   gabapentin (NEURONTIN)  100 MG capsule Take 1 capsule (100 mg total) by mouth 3 (three) times daily. (Patient not taking: Reported on 11/05/2022)   No current facility-administered medications for this visit. (Other)   REVIEW OF SYSTEMS: ROS   Positive for: Musculoskeletal, Endocrine, Eyes, Allergic/Imm Negative for: Constitutional, Gastrointestinal, Neurological, Skin, Genitourinary, HENT, Cardiovascular, Respiratory, Psychiatric, Heme/Lymph Last edited by Annalee Genta D, COT on 11/05/2022  1:16 PM.     ALLERGIES No Known Allergies  PAST MEDICAL HISTORY Past Medical History:  Diagnosis Date   Anemia    hx.   Arthritis    COPD (chronic obstructive pulmonary disease) (HCC)    reports at one poin he had copd but he has stopped smoking and now doesnt have any trouble breathing or  need an inhaler    Diabetes mellitus without complication (HCC)    type 2   Headache    none recently    High cholesterol    History of kidney stones    HOH (hard of hearing)    both ears   Hypertension    Pneumonia    years ago   Past Surgical History:  Procedure Laterality Date   ANKLE FRACTURE SURGERY Left    when 80-70- years old   ANTERIOR CERVICAL DECOMP/DISCECTOMY FUSION N/A 07/11/2017   Procedure: Anterior Cervical Decompression/Discectomy Fusion  - Cervical three-four;  Surgeon: Donalee Citrin,  MD;  Location: [facility] OR;  Service: Neurosurgery;  Laterality: N/A; limited extension of neck currently    CATARACT EXTRACTION Bilateral    CHOLECYSTECTOMY N/A 09/24/2021   Procedure: LAPAROSCOPIC CHOLECYSTECTOMY;  Surgeon: Fritzi MandesAllen, Shelby L, MD;  [facility];  Service: General;  Laterality: N/A;   EXTRACORPOREAL SHOCK WAVE LITHOTRIPSY Right 07/14/2020   Procedure: EXTRACORPOREAL SHOCK WAVE LITHOTRIPSY (ESWL);  Surgeon: Bjorn PippinWrenn, John, MD;  [facility];  Service: Urology;  Laterality: Right;   FRACTURE SURGERY      fx. lower left leg at age 84-13   TEE WITHOUT CARDIOVERSION N/A 09/28/2021   Procedure:  TRANSESOPHAGEAL ECHOCARDIOGRAM (TEE);  Surgeon: Tessa Lernerolia, Sunit, DO;  Location: [facility] ENDOSCOPY;  Service: Cardiovascular;  Laterality: N/A;   TOTAL KNEE ARTHROPLASTY Right 11/11/2017   Procedure: RIGHT TOTAL KNEE ARTHROPLASTY;  Surgeon: Ollen GrossAluisio, Frank, MD;  Location: [facility] ORS;  Service: Orthopedics;  Laterality: Right;   TOTAL KNEE ARTHROPLASTY Left 06/01/2019   Procedure: LEFT TOTAL KNEE ARTHROPLASTY;  Surgeon: Ollen GrossAluisio, Frank, MD;  Location: [facility]S;  Service: Orthopedics;  Laterality: Left;  50min   FAMILY HISTORY Family History  Problem Relation Age of Onset   Diabetes Father    Cancer Father    Alcohol abuse Father    Heart attack Father    Strabismus Daughter    SOCIAL HISTORY Social History   Tobacco Use   Smoking status: Former    Packs/day: 2.00    Years: 60.00    Total pack years: 120.00    Types: Cigarettes    Quit date: 07/11/2011    Years since quitting: 11.3   Smokeless tobacco: Never  Vaping Use   Vaping Use: Never used  Substance Use Topics   Alcohol use: Never   Drug use: Never       OPHTHALMIC EXAM:  Base Eye Exam     Visual Acuity (Snellen - Linear)       Right Left   Dist [place] 20/25 20/50   Dist ph [place] 20/20 -2 20/30    Correction: Glasses         Tonometry (Tonopen, 1:28 PM)       Right Left   Pressure 17 15         Pupils       Dark Light Shape React APD   Right 3 2 Round Slow None   Left 3 2 Round Slow None         Visual Fields (Counting fingers)       Left Right    Full Full         Extraocular Movement       Right Left    Full, Ortho Full, Ortho         Neuro/Psych     Oriented x3: Yes   Mood/Affect: Normal         Dilation     Both eyes: 1.0% Mydriacyl, 2.5% Phenylephrine @ 1:28 PM           Slit Lamp and Fundus Exam     Slit Lamp Exam       Right Left   Lids/Lashes Dermatochalasis - upper lid Dermatochalasis - upper lid, Meibomian gland dysfunction   Conjunctiva/Sclera nasal and temporal Pinguecula  temporal Pinguecula   Cornea subepithelial scarring inferior paracentral, mild Arcus, trace Punctate epithelial erosions, mild tear film debris Round subepithelial scarring nasal paracentral, Arcus, trace PEE, well healed cataract wound   Anterior Chamber deep and clear deep and clear  Iris Round and dilated Round and dilated   Lens Three piece Posterior chamber intraocular lens in good position, Open posterior capsule Three piece Posterior chamber intraocular lens in good position with open PC   Anterior Vitreous Vitreous syneresis, Posterior vitreous detachment, vitreous condensations Vitreous syneresis, Posterior vitreous detachment, vitreous condensations         Fundus Exam       Right Left   Disc mild Pallor, Sharp rim Pink and Sharp, temporal PPA   C/D Ratio 0.5 0.5   Macula Flat, Blunted foveal reflex, +Drusen, RPE mottling and clumping, No heme or edema Flat, blunted foveal reflex, mild Drusen, no heme or edema, Retinal pigment epithelial mottling and clumping   Vessels attenuated, mild tortuosity attenuated, mild tortuosity   Periphery attached, Superior pigmented lattice degeneration with excellent laser surrounding attached, Superior pigmented lattice degeneration with small atrophic holes - excellent laser surrounding           Refraction     Wearing Rx       Sphere Cylinder Axis Add   Right -0.25 Sphere  +2.50   Left -0.50 +0.50 140 +2.50         Manifest Refraction       Sphere Cylinder Axis Dist VA   Right -0.75 +0.75 010 20/20-2   Left -2.25 +0.75 140 20/30           IMAGING AND PROCEDURES  Imaging and Procedures for 01/07/18  OCT, Retina - OU - Both Eyes       Right Eye Quality was good. Central Foveal Thickness: 245. Progression has been stable. Findings include normal foveal contour, no IRF, no SRF, retinal drusen (+drusen, patchy ORA).   Left Eye Quality was good. Central Foveal Thickness: 234. Progression has been stable. Findings include  normal foveal contour, no IRF, no SRF, retinal drusen (+drusen, patchy ORA).   Notes Images taken, stored on drive  Diagnosis / Impression:  Non-exudative ARMD OU +drusen, patchy ORA OU  Clinical management:  See below  Abbreviations: NFP - Normal foveal profile. CME - cystoid macular edema. PED - pigment epithelial detachment. IRF - intraretinal fluid. SRF - subretinal fluid. EZ - ellipsoid zone. ERM - epiretinal membrane. ORA - outer retinal atrophy. ORT - outer retinal tubulation. SRHM - subretinal hyper-reflective material             ASSESSMENT/PLAN:   ICD-10-CM   1. Intermediate stage nonexudative age-related macular degeneration of both eyes  H35.3132 OCT, Retina - OU - Both Eyes    2. Lattice degeneration of both retinas  H35.413     3. Diabetes mellitus type 2 without retinopathy (HCC)  E11.9     4. Pseudophakia  Z96.1      1. Age related macular degeneration, non-exudative, both eyes   - OCT shows stable drusen and patchy ORA  - pt reports he stopped taking AREDS 2 vitamins  - Recommend amsler grid monitoring and restarting AREDS 2 supplements   - will continue to monitor  - f/u in 6-9 months DFE, OCT  2. Lattice degeneration OU   - pigmented lattice superiorly with atrophic holes  - s/p laser retinopexy OS (11.13.18), s/p laser retinopexy OD (11.27.18)  - excellent laser in place OU  - no new RT/RD or lattice  3. DM2 w/o retinopathy  - last A1c was 7.3 on 07.09.23 - The incidence, risk factors for progression, natural history and treatment options for diabetic retinopathy  were discussed with patient.   - The  need for close monitoring of blood glucose, blood pressure, and serum lipids, avoiding cigarette or any type of tobacco, and the need for long term follow up was also discussed with patient. - f/u in 1 year, sooner prn  4. Pseudophakia OU  - s/p CE/IOL OU  - beautiful surgery, doing well  - monitor  Ophthalmic Meds Ordered this visit:  No orders  of the defined types were placed in this encounter.    Return for f/u 6-9 months, non-exu ARMD OU, DFE, OCT.  There are no Patient Instructions on file for this visit.  Explained the diagnoses, plan, and follow up with the patient and they expressed understanding.  Patient expressed understanding of the importance of proper follow up care.   This document serves as a record of services personally performed by Karie Chimera, MD, PhD. It was created on their behalf by Annalee Genta, COMT. The creation of this record is the provider's dictation and/or activities during the visit.  Electronically signed by: Annalee Genta, COMT 11/06/22 9:41 PM   This document serves as a record of services personally performed by Karie Chimera, MD, PhD. It was created on their behalf by Glee Arvin. Manson Passey, OA an ophthalmic technician. The creation of this record is the provider's dictation and/or activities during the visit.    Electronically signed by: Glee Arvin. Manson Passey, [place] 11.27.2023 9:41 PM   Karie Chimera, M.D., Ph.D. Diseases & Surgery of the Retina and Vitreous [facility]  I have reviewed the above documentation for accuracy and completeness, and I agree with the above. Karie Chimera, M.D., Ph.D. 11/06/22 9:43 PM   Abbreviations: M myopia (nearsighted); A astigmatism; H hyperopia (farsighted); P presbyopia; Mrx spectacle prescription;  CTL contact lenses; OD right eye; OS left eye; OU both eyes  XT exotropia; ET esotropia; PEK punctate epithelial keratitis; PEE punctate epithelial erosions; DES dry eye syndrome; MGD meibomian gland dysfunction; ATs artificial tears; PFAT's preservative free artificial tears; NSC nuclear sclerotic cataract; PSC posterior subcapsular cataract; ERM epi-retinal membrane; PVD posterior vitreous detachment; RD retinal detachment; DM diabetes mellitus; DR diabetic retinopathy; NPDR non-proliferative diabetic retinopathy; PDR proliferative diabetic  retinopathy; CSME clinically significant macular edema; DME diabetic macular edema; dbh dot blot hemorrhages; CWS cotton wool spot; POAG primary open angle glaucoma; C/D cup-to-disc ratio; HVF humphrey visual field; GVF goldmann visual field; OCT optical coherence tomography; IOP intraocular pressure; BRVO Branch retinal vein occlusion; CRVO central retinal vein occlusion; CRAO central retinal artery occlusion; BRAO branch retinal artery occlusion; RT retinal tear; SB scleral buckle; PPV pars plana vitrectomy; VH Vitreous hemorrhage; PRP panretinal laser photocoagulation; IVK intravitreal kenalog; VMT vitreomacular traction; MH Macular hole;  NVD neovascularization of the disc; NVE neovascularization elsewhere; AREDS age related eye disease study; ARMD age related macular degeneration; POAG primary open angle glaucoma; EBMD epithelial/anterior basement membrane dystrophy; ACIOL anterior chamber intraocular lens; IOL intraocular lens; PCIOL posterior chamber intraocular lens; Phaco/IOL phacoemulsification with intraocular lens placement; PRK photorefractive keratectomy; LASIK laser assisted in situ keratomileusis; HTN hypertension; DM diabetes mellitus; COPD chronic obstructive pulmonary disease

## 2022-11-05 ENCOUNTER — Encounter (INDEPENDENT_AMBULATORY_CARE_PROVIDER_SITE_OTHER): Payer: Self-pay | Admitting: Ophthalmology

## 2022-11-05 ENCOUNTER — Ambulatory Visit (INDEPENDENT_AMBULATORY_CARE_PROVIDER_SITE_OTHER): Payer: Medicare Other | Admitting: Ophthalmology

## 2022-11-05 DIAGNOSIS — Z961 Presence of intraocular lens: Secondary | ICD-10-CM

## 2022-11-05 DIAGNOSIS — H35413 Lattice degeneration of retina, bilateral: Secondary | ICD-10-CM

## 2022-11-05 DIAGNOSIS — H353132 Nonexudative age-related macular degeneration, bilateral, intermediate dry stage: Secondary | ICD-10-CM

## 2022-11-05 DIAGNOSIS — E119 Type 2 diabetes mellitus without complications: Secondary | ICD-10-CM

## 2022-11-14 ENCOUNTER — Other Ambulatory Visit: Payer: Self-pay

## 2022-11-14 DIAGNOSIS — I10 Essential (primary) hypertension: Secondary | ICD-10-CM

## 2022-11-14 MED ORDER — AMLODIPINE BESYLATE 10 MG PO TABS: 10.0000 mg | ORAL_TABLET | Freq: Every day | ORAL | 0 refills | Status: DC

## 2022-11-29 ENCOUNTER — Other Ambulatory Visit: Payer: Self-pay

## 2022-11-29 DIAGNOSIS — I10 Essential (primary) hypertension: Secondary | ICD-10-CM

## 2022-11-29 MED ORDER — VALSARTAN 40 MG PO TABS: 40.0000 mg | ORAL_TABLET | Freq: Every day | ORAL | 0 refills | Status: DC

## 2023-01-18 ENCOUNTER — Ambulatory Visit: Payer: Medicare Other | Admitting: Physician Assistant

## 2023-02-05 NOTE — Progress Notes (Unsigned)
   Established Patient Office Visit  Subjective   Patient ID: Joshua [name], male    DOB: 1936/06/08  Age: 87 y.o. MRN: [id number]  No chief complaint on file.   HPI Joshua [name] is a 87 y.o. male presenting today for follow up of diabetes, hypertension, hyperlipidemia. Diabetes: denies hypoglycemic events, wounds or sores that are not healing well, increased thirst or urination. Denies vision problems, eye exam ***. Checking glucose at home, ranges have been ***. Taking *** as prescribed without any side effects. Has been following *** diet and *** for exercise. Hypertension: Patient here for follow-up of elevated blood pressure. He {is/is not:9024} exercising and {is/is not:9024} adherent to low salt diet.   Pt denies chest pain, SOB, dizziness, edema, syncope, fatigue or heart palpitations. Taking ***, reports {excellent/good/fair/poor:19665} compliance with treatment. Denies side effects. Hyperlipidemia: tolerating *** well with no myalgias or significant side effects. Currently consuming a {diet types:17450} diet. {types:19826} The ASCVD Risk score (Arnett DK, et al., 2019) failed to calculate for the following reasons:   The 2019 ASCVD risk score is only valid for ages 10 to 80  ROS Negative unless otherwise noted in HPI   Objective:     There were no vitals taken for this visit.  Physical Exam   No results found for any visits on 02/06/23.  {Labs (Optional):23779}  The ASCVD Risk score (Arnett DK, et al., 2019) failed to calculate for the following reasons:   The 2019 ASCVD risk score is only valid for ages 51 to 76    Assessment & Plan:  Diabetes mellitus type 2 in nonobese [facility])  Type 2 diabetes mellitus with hyperglycemia, without long-term current use of insulin ([place])  Pure hypercholesterolemia  Essential hypertension    No follow-ups on file.    Velva Harman, PA

## 2023-02-06 ENCOUNTER — Ambulatory Visit (INDEPENDENT_AMBULATORY_CARE_PROVIDER_SITE_OTHER): Payer: Medicare Other | Admitting: Family Medicine

## 2023-02-06 ENCOUNTER — Encounter: Payer: Self-pay | Admitting: Family Medicine

## 2023-02-06 VITALS — BP 123/79 | HR 90 | Resp 18 | Ht 72.0 in | Wt 208.0 lb

## 2023-02-06 DIAGNOSIS — E7800 Pure hypercholesterolemia, unspecified: Secondary | ICD-10-CM | POA: Diagnosis not present

## 2023-02-06 DIAGNOSIS — M1712 Unilateral primary osteoarthritis, left knee: Secondary | ICD-10-CM | POA: Diagnosis not present

## 2023-02-06 DIAGNOSIS — E1165 Type 2 diabetes mellitus with hyperglycemia: Secondary | ICD-10-CM | POA: Diagnosis not present

## 2023-02-06 DIAGNOSIS — I10 Essential (primary) hypertension: Secondary | ICD-10-CM | POA: Diagnosis not present

## 2023-02-06 DIAGNOSIS — J449 Chronic obstructive pulmonary disease, unspecified: Secondary | ICD-10-CM

## 2023-02-06 DIAGNOSIS — E119 Type 2 diabetes mellitus without complications: Secondary | ICD-10-CM

## 2023-02-06 DIAGNOSIS — L0100 Impetigo, unspecified: Secondary | ICD-10-CM

## 2023-02-06 LAB — POCT GLYCOSYLATED HEMOGLOBIN (HGB A1C): HbA1c POC (<> result, manual entry): 7.6 % (ref 4.0–5.6)

## 2023-02-06 MED ORDER — MUPIROCIN 2 % EX OINT: TOPICAL_OINTMENT | Freq: Two times a day (BID) | CUTANEOUS | 0 refills | Status: DC

## 2023-02-06 MED ORDER — AMLODIPINE BESYLATE 10 MG PO TABS: 10.0000 mg | ORAL_TABLET | Freq: Every day | ORAL | 1 refills | Status: DC

## 2023-02-06 MED ORDER — CELECOXIB 200 MG PO CAPS: 200.0000 mg | ORAL_CAPSULE | Freq: Every day | ORAL | 2 refills | Status: DC

## 2023-02-06 MED ORDER — ACCU-CHEK AVIVA PLUS VI STRP: 1.0000 | ORAL_STRIP | Freq: Every day | 3 refills | Status: AC

## 2023-02-06 MED ORDER — GLIPIZIDE ER 10 MG PO TB24: ORAL_TABLET | ORAL | 1 refills | Status: DC

## 2023-02-06 MED ORDER — VALSARTAN 40 MG PO TABS: 40.0000 mg | ORAL_TABLET | Freq: Every day | ORAL | 1 refills | Status: DC

## 2023-02-06 MED ORDER — MUPIROCIN 2 % EX OINT: TOPICAL_OINTMENT | Freq: Two times a day (BID) | CUTANEOUS | 0 refills | Status: AC

## 2023-02-06 NOTE — Assessment & Plan Note (Signed)
We will collect a fasting lipid panel at his annual wellness visit in April.  Recommended following a heart healthy diet low in fat, which includes limiting fried foods and sweets.  Will continue to monitor, if necessary we can start a medication for his cholesterol levels at his next visit.

## 2023-02-06 NOTE — Assessment & Plan Note (Signed)
Stable.  Continue amlodipine 10 mg and valsartan 40 mg daily.  Will collect labs including CMP for electrolytes and renal function at annual wellness visit on April 29.

## 2023-02-06 NOTE — Assessment & Plan Note (Signed)
Prescribed Celebrex 200 mg as he has been taking his wife's prescription for his osteoarthritis and requested his own.

## 2023-02-06 NOTE — Assessment & Plan Note (Signed)
A1c today 7.6, this is an increase from the last time it was checked in November which was 7.3.  We discussed the importance of having his medication with food at breakfast time to avoid hypoglycemia.  We also discussed the importance of dietary changes and limiting sweets and fried foods.  Provided patient education printouts of dietary recommendations as well.  Patient is agreeable to try this.  We will get labs at his annual wellness visit scheduled for April 29.  Will continue to monitor.

## 2023-02-06 NOTE — Patient Instructions (Signed)
Take your glipizide (Glucotrol XL) 10 mg tablet every morning with breakfast.  Limit the amount of sweet, sugary foods that you are eating.  You should also limit the number of carbs that you are eating which includes biscuits, bread, pasta, rice.  Make sure you are eating vegetables, fruits, and lean proteins like chicken or fish.  Limit the amount of fried foods that you are eating to help with your blood sugar and your cholesterol.

## 2023-02-06 NOTE — Assessment & Plan Note (Addendum)
A1c today 7.6, this is an increase from the last time it was checked in November which was 7.3.  We discussed the importance of having his medication with food at breakfast time to avoid hypoglycemia.  We also discussed the importance of dietary changes and limiting sweets and fried foods.  Provided patient education printouts of dietary recommendations as well.  Patient is agreeable to try this.  We will get labs at his annual wellness visit scheduled for April 29.  Will continue to monitor.

## 2023-02-06 NOTE — Assessment & Plan Note (Signed)
Stable, patient admits that he has not been using his prescribed inhaler because he feels okay.  We discussed the importance of taking medication even when he feels "normal" to prevent an exacerbation.  Patient expressed understanding.

## 2023-04-05 ENCOUNTER — Other Ambulatory Visit: Payer: Medicare Other

## 2023-04-05 ENCOUNTER — Other Ambulatory Visit: Payer: Self-pay

## 2023-04-05 DIAGNOSIS — E119 Type 2 diabetes mellitus without complications: Secondary | ICD-10-CM

## 2023-04-05 DIAGNOSIS — Z130 Encounter for screening for diseases of the blood and blood-forming organs and certain disorders involving the immune mechanism: Secondary | ICD-10-CM

## 2023-04-05 DIAGNOSIS — Z0000 Encounter for general adult medical examination without abnormal findings: Secondary | ICD-10-CM

## 2023-04-06 LAB — CBC WITH DIFFERENTIAL/PLATELET

## 2023-04-06 LAB — COMPREHENSIVE METABOLIC PANEL
Albumin/Globulin Ratio: 1.9 (ref 1.2–2.2)
CO2: 20 mmol/L (ref 20–29)
Chloride: 106 mmol/L (ref 96–106)
Creatinine, Ser: 1.42 mg/dL — ABNORMAL HIGH (ref 0.76–1.27)
Globulin, Total: 2.2 g/dL (ref 1.5–4.5)
Total Protein: 6.3 g/dL (ref 6.0–8.5)

## 2023-04-06 LAB — TSH: TSH: 1.66 u[IU]/mL (ref 0.450–4.500)

## 2023-04-06 LAB — HEMOGLOBIN A1C: Hgb A1c MFr Bld: 7.5 % — ABNORMAL HIGH (ref 4.8–5.6)

## 2023-04-08 ENCOUNTER — Encounter: Payer: Medicare Other | Admitting: Family Medicine

## 2023-04-09 LAB — COMPREHENSIVE METABOLIC PANEL
ALT: 12 IU/L (ref 0–44)
AST: 13 IU/L (ref 0–40)
Albumin: 4.1 g/dL (ref 3.7–4.7)
Alkaline Phosphatase: 124 IU/L — ABNORMAL HIGH (ref 44–121)
BUN/Creatinine Ratio: 17 (ref 10–24)
BUN: 24 mg/dL (ref 8–27)
Bilirubin Total: 0.3 mg/dL (ref 0.0–1.2)
Calcium: 9.1 mg/dL (ref 8.6–10.2)
Glucose: 171 mg/dL — ABNORMAL HIGH (ref 70–99)
Potassium: 5.1 mmol/L (ref 3.5–5.2)
Sodium: 141 mmol/L (ref 134–144)
eGFR: 48 mL/min/{1.73_m2} — ABNORMAL LOW (ref >59)

## 2023-04-09 LAB — CBC WITH DIFFERENTIAL/PLATELET
Basophils Absolute: 0 10*3/uL (ref 0.0–0.2)
Basos: 1 %
EOS (ABSOLUTE): 0.5 10*3/uL — ABNORMAL HIGH (ref 0.0–0.4)
Hematocrit: 45.5 % (ref 37.5–51.0)
Hemoglobin: 14.9 g/dL (ref 13.0–17.7)
Immature Granulocytes: 0 %
Lymphocytes Absolute: 1.2 10*3/uL (ref 0.7–3.1)
MCH: 30.3 pg (ref 26.6–33.0)
MCHC: 32.7 g/dL (ref 31.5–35.7)
MCV: 93 fL (ref 79–97)
Monocytes Absolute: 0.6 10*3/uL (ref 0.1–0.9)
RBC: 4.91 x10E6/uL (ref 4.14–5.80)
RDW: 13.1 % (ref 11.6–15.4)

## 2023-04-09 LAB — LIPID PANEL
Chol/HDL Ratio: 3.4 ratio (ref 0.0–5.0)
Cholesterol, Total: 166 mg/dL (ref 100–199)
HDL: 49 mg/dL (ref >39)
LDL Chol Calc (NIH): 103 mg/dL — ABNORMAL HIGH (ref 0–99)
Triglycerides: 74 mg/dL (ref 0–149)
VLDL Cholesterol Cal: 14 mg/dL (ref 5–40)

## 2023-04-09 LAB — HEMOGLOBIN A1C: Est. average glucose Bld gHb Est-mCnc: 169 mg/dL

## 2023-04-18 ENCOUNTER — Ambulatory Visit (INDEPENDENT_AMBULATORY_CARE_PROVIDER_SITE_OTHER): Payer: Medicare Other

## 2023-04-18 VITALS — Ht 73.0 in | Wt 208.0 lb

## 2023-04-18 DIAGNOSIS — Z0000 Encounter for general adult medical examination without abnormal findings: Secondary | ICD-10-CM | POA: Diagnosis not present

## 2023-04-18 NOTE — Progress Notes (Signed)
Subjective:   Joshua [name] is a 87 y.o. male who presents for Medicare Annual/Subsequent preventive examination.  Review of Systems    Virtual Visit via Telephone Note  I connected with  Joshua [name] on 04/18/23 at  8:30 AM EDT by telephone and verified that I am speaking with the correct person using two identifiers.  Location: Patient: Home Provider: Office Persons participating in the virtual visit: patient/Nurse Health Advisor   I discussed the limitations, risks, security and privacy concerns of performing an evaluation and management service by telephone and the availability of in person appointments. The patient expressed understanding and agreed to proceed.  Interactive audio and video telecommunications were attempted between this nurse and patient, however failed, due to patient having technical difficulties OR patient did not have access to video capability.  We continued and completed visit with audio only.  Some vital signs may be absent or patient reported.   Tillie Rung, LPN  Cardiac Risk Factors include: advanced age (>60men, >17 women);male gender;diabetes mellitus;hypertension     Objective:    Today's Vitals   04/18/23 0834  Weight: 208 lb (94.3 kg)  Height: 6\' 1"  (1.854 m)   Body mass index is 27.44 kg/m.     04/18/2023    8:42 AM 09/28/2021    1:32 PM 09/23/2021   10:57 PM 07/04/2021    2:52 PM 07/14/2020    9:54 AM 07/01/2020   11:07 AM 06/01/2019    1:34 PM  Advanced Directives  Does Patient Have a Medical Advance Directive? Yes No Yes No Yes No Yes  Type of Estate agent of [place];Living will  Living will  Healthcare Power of [place];Living will  Living will  Does patient want to make changes to medical advance directive?   No - Patient declined    No - Patient declined  Copy of Healthcare Power of Attorney in Chart? No - copy requested        Would patient like information on creating a medical advance directive?   No - Patient declined  No - Patient declined  No - Patient declined     Current Medications (verified) Outpatient Encounter Medications as of 04/18/2023  Medication Sig   ACCU-CHEK AVIVA PLUS test strip 1 each by Other route daily.   acetaminophen (TYLENOL) 500 MG tablet Take 2 tablets (1,000 mg total) by mouth every 6 (six) hours as needed.   albuterol (PROAIR HFA) 108 (90 Base) MCG/ACT inhaler Inhale 2 puffs into the lungs every 4 (four) hours as needed for wheezing or shortness of breath.   amLODipine (NORVASC) 10 MG tablet Take 1 tablet (10 mg total) by mouth daily.   celecoxib (CELEBREX) 200 MG capsule Take 1 capsule (200 mg total) by mouth daily.   gabapentin (NEURONTIN) 100 MG capsule Take 1 capsule (100 mg total) by mouth 3 (three) times daily.   glipiZIDE (GLUCOTROL XL) 10 MG 24 hr tablet TAKE 1 TABLET BY MOUTH DAILY WITH BREAKFAST   sodium chloride (OCEAN) 0.65 % SOLN nasal spray Place 1 spray into both nostrils as needed for congestion.   valsartan (DIOVAN) 40 MG tablet Take 1 tablet (40 mg total) by mouth daily.   No facility-administered encounter medications on file as of 04/18/2023.    Allergies (verified) Patient has no known allergies.   History: Past Medical History:  Diagnosis Date   Anemia    hx.   Arthritis    COPD (chronic obstructive pulmonary disease) (HCC)  reports at one poin he had copd but he has stopped smoking and now doesnt have any trouble breathing or  need an inhaler    Diabetes mellitus without complication (HCC)    type 2   Headache    none recently    High cholesterol    History of kidney stones    HOH (hard of hearing)    both ears   Hypertension    Pneumonia    years ago   Past Surgical History:  Procedure Laterality Date   ANKLE FRACTURE SURGERY Left    when 35-83- years old   ANTERIOR CERVICAL DECOMP/DISCECTOMY FUSION N/A 07/11/2017   Procedure: Anterior Cervical Decompression/Discectomy Fusion  - Cervical three-four;  Surgeon: Donalee Citrin, MD;  Location: [facility] OR;  Service: Neurosurgery;  Laterality: N/A; limited extension of neck currently    CATARACT EXTRACTION Bilateral    CHOLECYSTECTOMY N/A 09/24/2021   Procedure: LAPAROSCOPIC CHOLECYSTECTOMY;  Surgeon: Fritzi Mandes, MD;  Location:  [facility] OR;  Service: General;  Laterality: N/A;   EXTRACORPOREAL SHOCK WAVE LITHOTRIPSY Right 07/14/2020   Procedure: EXTRACORPOREAL SHOCK WAVE LITHOTRIPSY (ESWL);  Surgeon: Bjorn Pippin, MD;  Location: [facility];  Service: Urology;  Laterality: Right;   FRACTURE SURGERY      fx. lower left leg at age 26-13   TEE WITHOUT CARDIOVERSION N/A 09/28/2021   Procedure: TRANSESOPHAGEAL ECHOCARDIOGRAM (TEE);  Surgeon: Tessa Lerner, DO;  Location: [facility];  Service: Cardiovascular;  Laterality: N/A;   TOTAL KNEE ARTHROPLASTY Right 11/11/2017   Procedure: RIGHT TOTAL KNEE ARTHROPLASTY;  Surgeon: Ollen Gross, MD;  Location: [facility] ORS;  Service: Orthopedics;  Laterality: Right;   TOTAL KNEE ARTHROPLASTY Left 06/01/2019   Procedure: LEFT TOTAL KNEE ARTHROPLASTY;  Surgeon: Ollen Gross, MD;  Location: [facility] OR[facility];  Service: Orthopedics;  Laterality: Left;    Family History  Problem Relation Age of Onset   Diabetes Father    Cancer Father    Alcohol abuse Father    Heart attack Father    Strabismus Daughter    Social History   Socioeconomic History   Marital status: Married    Spouse name: Not on file   Number of children: Not on file   Years of education: Not on file   Highest education level: Not on file  Occupational History   Not on file  Tobacco Use   Smoking status: Former    Packs/day: 2.00    Years: 60.00    Additional pack years: 0.00    Total pack years: 120.00    Types: Cigarettes    Quit date: 07/11/2011    Years since quitting: 11.7   Smokeless tobacco: Never  Vaping Use   Vaping Use: Never used  Substance and Sexual Activity   Alcohol use: Never   Drug use: Never   Sexual activity: Not Currently  Other  Topics Concern   Not on file  Social History Narrative   Not on file   Social Determinants of Health   Financial Resource Strain: Low Risk  (04/18/2023)   Overall Financial Resource Strain (CARDIA)    Difficulty of Paying Living Expenses: Not hard at all  Food Insecurity: No Food Insecurity (04/18/2023)   Hunger Vital Sign    Worried About Running Out of Food in the Last Year: Never true    Ran Out of Food in the Last Year: Never true  Transportation Needs: No Transportation Needs (04/18/2023)   PRAPARE - Administrator, Civil Service (Medical):  No    Lack of Transportation (Non-Medical): No  Physical Activity: Inactive (04/18/2023)   Exercise Vital Sign    Days of Exercise per Week: 0 days    Minutes of Exercise per Session: 0 min  Stress: No Stress Concern Present (04/18/2023)   [employer] of Occupational Health - Occupational Stress Questionnaire    Feeling of Stress : Not at all  Social Connections: Moderately Isolated (04/18/2023)   Social Connection and Isolation Panel [NHANES]    Frequency of Communication with Friends and Family: More than three times a week    Frequency of Social Gatherings with Friends and Family: More than three times a week    Attends Religious Services: Never    Database administrator or Organizations: No    Attends Engineer, structural: Never    Marital Status: Married    Tobacco Counseling Counseling given: Yes   Clinical Intake:  Pre-visit preparation completed: No  Pain : No/denies pain Nutrition Risk Assessment:  Has the patient had any N/V/D within the last 2 months?  No  Does the patient have any non-healing wounds?  No  Has the patient had any unintentional weight loss or weight gain?  No   Diabetes:  Is the patient diabetic?  Yes  If diabetic, was a CBG obtained today?  No  Did the patient bring in their glucometer from home?  No  How often do you monitor your CBG's? PRN.   Financial Strains and Diabetes  Management:  Are you having any financial strains with the device, your supplies or your medication? No .  Does the patient want to be seen by Chronic Care Management for management of their diabetes?  No  Would the patient like to be referred to a Nutritionist or for Diabetic Management?  No   Diabetic Exams:  Diabetic Eye Exam: Completed . Overdue for diabetic eye exam. Pt has been advised about the importance in completing this exam. A referral has been placed today. Message sent to referral coordinator for scheduling purposes. Advised pt to expect a call from office referred to regarding appt.  Diabetic Foot Exam: Completed . Pt has been advised about the importance in completing this exam. Pt is scheduled for diabetic foot exam on Followed by PCP.      BMI - recorded: 27.44 Nutritional Status: BMI 25 -29 Overweight Nutritional Risks: None Diabetes: Yes CBG done?: No Did pt. bring in CBG monitor from home?: No  How often do you need to have someone help you when you read instructions, pamphlets, or other written materials from your doctor or pharmacy?: 1 - Never  Diabetic?  Yes  Interpreter Needed?: No  Information entered by :: Theresa Mulligan LPN   Activities of Daily Living    04/18/2023    8:39 AM 10/18/2022    1:13 PM  In your present state of health, do you have any difficulty performing the following activities:  Hearing? 1 1  Comment Wears hearing aids   Vision? 0 1  Difficulty concentrating or making decisions? 0 0  Walking or climbing stairs? 0 1  Dressing or bathing? 0 0  Doing errands, shopping? 0 1  Preparing Food and eating ? N   Using the Toilet? N   In the past six months, have you accidently leaked urine? N   Do you have problems with loss of bowel control? N   Managing your Medications? N   Managing your Finances? N   Housekeeping or managing  your Housekeeping? N     Patient Care Team: [name], PA as PCP - General (Family  Medicine) Maisie Fus, MD as PCP - Cardiology (Cardiology)  Indicate any recent Medical Services you may have received from other than [facility] providers in the past year (date may be approximate).     Assessment:   This is a routine wellness examination for Criston.  Hearing/Vision screen Hearing Screening - Comments:: Wears hearing aids Vision Screening - Comments:: Wears reading glasses - up to date with routine eye exams with  Deferred  Dietary issues and exercise activities discussed: Current Exercise Habits: The patient does not participate in regular exercise at present, Exercise limited by: None identified   Goals Addressed               This Visit's Progress     No current goal (pt-stated)         Depression Screen    04/18/2023    8:38 AM 02/06/2023    2:20 PM 10/18/2022    1:12 PM 02/26/2022   10:27 AM 10/05/2021    3:36 PM  PHQ 2/9 Scores  PHQ - 2 Score 0 0 1 1 0  PHQ- 9 Score   8 4 0    Fall Risk    04/18/2023    8:40 AM 02/06/2023    2:21 PM 10/18/2022    1:13 PM 02/26/2022   10:27 AM 10/05/2021    3:35 PM  Fall Risk   Falls in the past year? 0 0 0 1 1  Number falls in past yr: 0 0 0 0 1  Injury with Fall? 0 0 0 0 1  Risk for fall due to : No Fall Risks   No Fall Risks History of fall(s);Impaired balance/gait;Impaired mobility  Follow up Falls prevention discussed   Falls evaluation completed Falls evaluation completed    FALL RISK PREVENTION PERTAINING TO THE HOME:  Any stairs in or around the home? No  If so, are there any without handrails? No  Home free of loose throw rugs in walkways, pet beds, electrical cords, etc? Yes  Adequate lighting in your home to reduce risk of falls? Yes   ASSISTIVE DEVICES UTILIZED TO PREVENT FALLS:  Life alert? No  Use of a cane, walker or w/c? Yes  Grab bars in the bathroom? Yes  Shower chair or bench in shower? Yes  Elevated toilet seat or a handicapped toilet? Yes   TIMED UP AND GO:  Was the test performed?  No . Audio Visit   Cognitive Function:        04/18/2023    8:41 AM 02/26/2022   10:14 AM  6CIT Screen  What Year? 0 points 0 points  What month? 0 points 0 points  What time? 0 points 3 points  Count back from 20 0 points 2 points  Months in reverse 0 points 4 points  Repeat phrase 8 points 0 points  Total Score 8 points 9 points    Immunizations Immunization History  Administered Date(s) Administered   Fluad Quad(high Dose 65+) 10/18/2022   Influenza, High Dose Seasonal PF 07/27/2017   Influenza-Unspecified 09/23/2017, 08/17/2021   PFIZER(Purple Top)SARS-COV-2 Vaccination 03/20/2020, 04/10/2020, 10/13/2020   Pneumococcal Conjugate-13 05/26/2014   Pneumococcal Polysaccharide-23 10/15/2006   Td 02/17/2020   Tdap 08/31/2011   Zoster Recombinat (Shingrix) 03/26/2017, 06/01/2017    TDAP status: Up to date  Flu Vaccine status: Up to date  Pneumococcal vaccine status: Up to date  Covid-19 vaccine status: Completed vaccines  Qualifies for Shingles Vaccine? Yes   Zostavax completed Yes   Shingrix Completed?: Yes  Screening Tests Health Maintenance  Topic Date Due   FOOT EXAM  11/20/2022   OPHTHALMOLOGY EXAM  04/18/2023 (Originally 03/28/1946)   Diabetic kidney evaluation - Urine ACR  04/19/2023 (Originally 03/28/1954)   COVID-19 Vaccine (4 - 2023-24 season) 05/04/2023 (Originally 08/10/2022)   INFLUENZA VACCINE  07/11/2023   HEMOGLOBIN A1C  10/05/2023   Diabetic kidney evaluation - eGFR measurement  04/04/2024   Medicare Annual Wellness (AWV)  04/17/2024   DTaP/Tdap/Td (3 - Td or Tdap) 02/16/2030   Pneumonia Vaccine 62+ Years old  Completed   Zoster Vaccines- Shingrix  Completed   HPV VACCINES  Aged Out    Health Maintenance  Health Maintenance Due  Topic Date Due   FOOT EXAM  11/20/2022    Colorectal cancer screening: No longer required.   Lung Cancer Screening: (Low Dose CT Chest recommended if Age 21-80 years, 30 pack-year currently smoking OR have quit  w/in 15years.) does qualify.   Lung Cancer Screening Referral: Deferred  Additional Screening:  Hepatitis C Screening: does not qualify; Completed   Vision Screening: Recommended annual ophthalmology exams for early detection of glaucoma and other disorders of the eye. Is the patient up to date with their annual eye exam?  Yes  Who is the provider or what is the name of the office in which the patient attends annual eye exams? Deferred If pt is not established with a provider, would they like to be referred to a provider to establish care? .   Dental Screening: Recommended annual dental exams for proper oral hygiene  Community Resource Referral / Chronic Care Management:  CRR required this visit?  No   CCM required this visit?  No      Plan:     I have personally reviewed and noted the following in the patient's chart:   Medical and social history Use of alcohol, tobacco or illicit drugs  Current medications and supplements including opioid prescriptions. Patient is not currently taking opioid prescriptions. Functional ability and status Nutritional status Physical activity Advanced directives List of other physicians Hospitalizations, surgeries, and ER visits in previous 12 months Vitals Screenings to include cognitive, depression, and falls Referrals and appointments  In addition, I have reviewed and discussed with patient certain preventive protocols, quality metrics, and best practice recommendations. A written personalized care plan for preventive services as well as general preventive health recommendations were provided to patient.     Tillie Rung, LPN   12/15/1094   Nurse Notes: Patient due Diabetic diabetic kidney evaluation-Urine ACR

## 2023-04-18 NOTE — Patient Instructions (Addendum)
Joshua [name] , Thank you for taking time to come for your Medicare Wellness Visit. I appreciate your ongoing commitment to your health goals. Please review the following plan we discussed and let me know if I can assist you in the future.   These are the goals we discussed:  Goals       No current goal (pt-stated)        This is a list of the screening recommended for you and due dates:  Health Maintenance  Topic Date Due   Complete foot exam   11/20/2022   Eye exam for diabetics  04/18/2023*   Yearly kidney health urinalysis for diabetes  04/19/2023*   COVID-19 Vaccine (4 - 2023-24 season) 05/04/2023*   Flu Shot  07/11/2023   Hemoglobin A1C  10/05/2023   Yearly kidney function blood test for diabetes  04/04/2024   Medicare Annual Wellness Visit  04/17/2024   DTaP/Tdap/Td vaccine (3 - Td or Tdap) 02/16/2030   Pneumonia Vaccine  Completed   Zoster (Shingles) Vaccine  Completed   HPV Vaccine  Aged Out  *Topic was postponed. The date shown is not the original due date.    Advanced directives: Please bring a copy of your health care power of attorney and living will to the office to be added to your chart at your convenience.   Conditions/risks identified: None  Next appointment: Follow up in one year for your annual wellness visit.   Preventive Care 85 Years and Older, Male  Preventive care refers to lifestyle choices and visits with your health care provider that can promote health and wellness. What does preventive care include? A yearly physical exam. This is also called an annual well check. Dental exams once or twice a year. Routine eye exams. Ask your health care provider how often you should have your eyes checked. Personal lifestyle choices, including: Daily care of your teeth and gums. Regular physical activity. Eating a healthy diet. Avoiding tobacco and drug use. Limiting alcohol use. Practicing safe sex. Taking low doses of aspirin every day. Taking vitamin  and mineral supplements as recommended by your health care provider. What happens during an annual well check? The services and screenings done by your health care provider during your annual well check will depend on your age, overall health, lifestyle risk factors, and family history of disease. Counseling  Your health care provider may ask you questions about your: Alcohol use. Tobacco use. Drug use. Emotional well-being. Home and relationship well-being. Sexual activity. Eating habits. History of falls. Memory and ability to understand (cognition). Work and work Astronomer. Screening  You may have the following tests or measurements: Height, weight, and BMI. Blood pressure. Lipid and cholesterol levels. These may be checked every 5 years, or more frequently if you are over 69 years old. Skin check. Lung cancer screening. You may have this screening every year starting at age 85 if you have a 30-pack-year history of smoking and currently smoke or have quit within the past 15 years. Fecal occult blood test (FOBT) of the stool. You may have this test every year starting at age 85. Flexible sigmoidoscopy or colonoscopy. You may have a sigmoidoscopy every 5 years or a colonoscopy every 10 years starting at age 85. Prostate cancer screening. Recommendations will vary depending on your family history and other risks. Hepatitis C blood test. Hepatitis B blood test. Sexually transmitted disease (STD) testing. Diabetes screening. This is done by checking your blood sugar (glucose) after you have not eaten  for a while (fasting). You may have this done every 1-3 years. Abdominal aortic aneurysm (AAA) screening. You may need this if you are a current or former smoker. Osteoporosis. You may be screened starting at age 85 if you are at high risk. Talk with your health care provider about your test results, treatment options, and if necessary, the need for more tests. Vaccines  Your health care  provider may recommend certain vaccines, such as: Influenza vaccine. This is recommended every year. Tetanus, diphtheria, and acellular pertussis (Tdap, Td) vaccine. You may need a Td booster every 10 years. Zoster vaccine. You may need this after age 85. Pneumococcal 13-valent conjugate (PCV13) vaccine. One dose is recommended after age 85. Pneumococcal polysaccharide (PPSV23) vaccine. One dose is recommended after age 85. Talk to your health care provider about which screenings and vaccines you need and how often you need them. This information is not intended to replace advice given to you by your health care provider. Make sure you discuss any questions you have with your health care provider. Document Released: 12/23/2015 Document Revised: 08/15/2016 Document Reviewed: 09/27/2015 Elsevier Interactive Patient Education  2017 [place] Prevention in the Home Falls can cause injuries. They can happen to people of all ages. There are many things you can do to make your home safe and to help prevent falls. What can I do on the outside of my home? Regularly fix the edges of walkways and driveways and fix any cracks. Remove anything that might make you trip as you walk through a door, such as a raised step or threshold. Trim any bushes or trees on the path to your home. Use bright outdoor lighting. Clear any walking paths of anything that might make someone trip, such as rocks or tools. Regularly check to see if handrails are loose or broken. Make sure that both sides of any steps have handrails. Any raised decks and porches should have guardrails on the edges. Have any leaves, snow, or ice cleared regularly. Use sand or salt on walking paths during winter. Clean up any spills in your garage right away. This includes oil or grease spills. What can I do in the bathroom? Use night lights. Install grab bars by the toilet and in the tub and shower. Do not use towel bars as grab  bars. Use non-skid mats or decals in the tub or shower. If you need to sit down in the shower, use a plastic, non-slip stool. Keep the floor dry. Clean up any water that spills on the floor as soon as it happens. Remove soap buildup in the tub or shower regularly. Attach bath mats securely with double-sided non-slip rug tape. Do not have throw rugs and other things on the floor that can make you trip. What can I do in the bedroom? Use night lights. Make sure that you have a light by your bed that is easy to reach. Do not use any sheets or blankets that are too big for your bed. They should not hang down onto the floor. Have a firm chair that has side arms. You can use this for support while you get dressed. Do not have throw rugs and other things on the floor that can make you trip. What can I do in the kitchen? Clean up any spills right away. Avoid walking on wet floors. Keep items that you use a lot in easy-to-reach places. If you need to reach something above you, use a strong step stool that has  a grab bar. Keep electrical cords out of the way. Do not use floor polish or wax that makes floors slippery. If you must use wax, use non-skid floor wax. Do not have throw rugs and other things on the floor that can make you trip. What can I do with my stairs? Do not leave any items on the stairs. Make sure that there are handrails on both sides of the stairs and use them. Fix handrails that are broken or loose. Make sure that handrails are as long as the stairways. Check any carpeting to make sure that it is firmly attached to the stairs. Fix any carpet that is loose or worn. Avoid having throw rugs at the top or bottom of the stairs. If you do have throw rugs, attach them to the floor with carpet tape. Make sure that you have a light switch at the top of the stairs and the bottom of the stairs. If you do not have them, ask someone to add them for you. What else can I do to help prevent  falls? Wear shoes that: Do not have high heels. Have rubber bottoms. Are comfortable and fit you well. Are closed at the toe. Do not wear sandals. If you use a stepladder: Make sure that it is fully opened. Do not climb a closed stepladder. Make sure that both sides of the stepladder are locked into place. Ask someone to hold it for you, if possible. Clearly mark and make sure that you can see: Any grab bars or handrails. First and last steps. Where the edge of each step is. Use tools that help you move around (mobility aids) if they are needed. These include: Canes. Walkers. Scooters. Crutches. Turn on the lights when you go into a dark area. Replace any light bulbs as soon as they burn out. Set up your furniture so you have a clear path. Avoid moving your furniture around. If any of your floors are uneven, fix them. If there are any pets around you, be aware of where they are. Review your medicines with your doctor. Some medicines can make you feel dizzy. This can increase your chance of falling. Ask your doctor what other things that you can do to help prevent falls. This information is not intended to replace advice given to you by your health care provider. Make sure you discuss any questions you have with your health care provider. Document Released: 09/22/2009 Document Revised: 05/03/2016 Document Reviewed: 12/31/2014 Elsevier Interactive Patient Education  2017 [employer].

## 2023-04-19 ENCOUNTER — Encounter: Payer: Self-pay | Admitting: Family Medicine

## 2023-04-19 DIAGNOSIS — H3530 Unspecified macular degeneration: Secondary | ICD-10-CM

## 2023-06-05 ENCOUNTER — Other Ambulatory Visit: Payer: Self-pay | Admitting: Family Medicine

## 2023-06-05 DIAGNOSIS — M1712 Unilateral primary osteoarthritis, left knee: Secondary | ICD-10-CM

## 2023-07-15 ENCOUNTER — Ambulatory Visit: Payer: Medicare Other | Admitting: Family Medicine

## 2023-07-15 ENCOUNTER — Encounter: Payer: Self-pay | Admitting: Family Medicine

## 2023-07-15 VITALS — BP 143/84 | HR 113 | Resp 18 | Ht 73.0 in | Wt 209.0 lb

## 2023-07-15 DIAGNOSIS — E1169 Type 2 diabetes mellitus with other specified complication: Secondary | ICD-10-CM | POA: Diagnosis not present

## 2023-07-15 DIAGNOSIS — E1165 Type 2 diabetes mellitus with hyperglycemia: Secondary | ICD-10-CM | POA: Diagnosis not present

## 2023-07-15 DIAGNOSIS — I10 Essential (primary) hypertension: Secondary | ICD-10-CM | POA: Diagnosis not present

## 2023-07-15 DIAGNOSIS — E785 Hyperlipidemia, unspecified: Secondary | ICD-10-CM

## 2023-07-15 DIAGNOSIS — Z7984 Long term (current) use of oral hypoglycemic drugs: Secondary | ICD-10-CM

## 2023-07-15 DIAGNOSIS — R39198 Other difficulties with micturition: Secondary | ICD-10-CM

## 2023-07-15 DIAGNOSIS — J449 Chronic obstructive pulmonary disease, unspecified: Secondary | ICD-10-CM

## 2023-07-15 LAB — POCT GLYCOSYLATED HEMOGLOBIN (HGB A1C): HbA1c POC (<> result, manual entry): 7.4 % (ref 4.0–5.6)

## 2023-07-15 MED ORDER — AMLODIPINE BESYLATE 10 MG PO TABS
10.0000 mg | ORAL_TABLET | Freq: Every day | ORAL | 1 refills | Status: DC
Start: 2023-07-15 — End: 2023-12-26

## 2023-07-15 MED ORDER — VALSARTAN 40 MG PO TABS
40.0000 mg | ORAL_TABLET | Freq: Every day | ORAL | 1 refills | Status: DC
Start: 2023-07-15 — End: 2023-12-26

## 2023-07-15 MED ORDER — GLIPIZIDE ER 10 MG PO TB24
ORAL_TABLET | ORAL | 1 refills | Status: DC
Start: 2023-07-15 — End: 2023-12-26

## 2023-07-15 NOTE — Assessment & Plan Note (Signed)
Last lipid panel: LDL 103, HDL 49, triglycerides 74.  We will recheck lipid panel before next appointment.  Until then, encouraged to follow heart healthy diet low in fat.  If lipids continue to remain elevated, recommend starting statin medication.

## 2023-07-15 NOTE — Progress Notes (Signed)
Established Patient Office Visit  Subjective   Patient ID: Joshua [name], male    DOB: 05/24/1936  Age: 87 y.o. MRN: [id number]  Chief Complaint  Patient presents with   Diabetes   Hyperlipidemia   Hypertension   COPD    HPI Joshua [name] is a 87 y.o. male presenting today for follow up of hypertension, hyperlipidemia, diabetes, COPD.  He also endorses some lower abdominal discomfort, decreased urine stream, and malodorous urine.  He denies hematuria/changes in the color of his urine, dysuria, new back pain, fever, chills. Hypertension: Pt denies chest pain, SOB, dizziness, edema, syncope, fatigue or heart palpitations. Taking amlodipine and valsartan at night, reports excellent compliance with treatment. Denies side effects. Hyperlipidemia:Currently consuming a general diet.  The ASCVD Risk score (Arnett DK, et al., 2019) failed to calculate for the following reasons:   The 2019 ASCVD risk score is only valid for ages 92 to 38 Diabetes: denies hypoglycemic events, wounds or sores that are not healing well, increased thirst or urination. Denies vision problems, eye exam complete but he is unsure where.  Taking glipizide as prescribed without any side effects.  He states that he has not really changed his diet from his last office visit. COPD: Not currently on maintenance medication.  He does have an albuterol rescue inhaler, but he has not needed to use it at all for more than a year.  ROS Negative unless otherwise noted in HPI   Objective:     BP (!) 143/84 (BP Location: Left Arm, Patient Position: Sitting, Cuff Size: Normal)   Pulse (!) 113   Resp 18   Ht 6\' 1"  (1.854 m)   Wt 209 lb (94.8 kg)   SpO2 96%   BMI 27.57 kg/m   Physical Exam Constitutional:      General: He is not in acute distress.    Appearance: Normal appearance.  HENT:     Head: Normocephalic and atraumatic.  Cardiovascular:     Rate and Rhythm: Normal rate and regular rhythm.     Heart sounds:  Normal heart sounds. No murmur heard.    No friction rub. No gallop.  Pulmonary:     Effort: Pulmonary effort is normal. No respiratory distress.     Breath sounds: Normal breath sounds. No wheezing, rhonchi or rales.  Skin:    General: Skin is warm and dry.  Neurological:     Mental Status: He is alert and oriented to person, place, and time.  Psychiatric:        Mood and Affect: Mood normal.    Results for orders placed or performed in visit on 07/15/23  POCT HgB A1C  Result Value Ref Range   Hemoglobin A1C     HbA1c POC (<> result, manual entry) 7.4 4.0 - 5.6 %   HbA1c, POC (prediabetic range)     HbA1c, POC (controlled diabetic range)       Assessment & Plan:  Essential hypertension Assessment & Plan: Stable.  Continue amlodipine 10 mg and valsartan 40 mg daily.  Will collect labs including CMP for electrolytes and renal function at next follow-up visit.  Orders: -     amLODIPine Besylate; Take 1 tablet (10 mg total) by mouth daily.  Dispense: 90 tablet; Refill: 1 -     Valsartan; Take 1 tablet (40 mg total) by mouth daily.  Dispense: 90 tablet; Refill: 1  Type 2 diabetes mellitus with hyperglycemia, without long-term current use of insulin (HCC) Assessment & Plan:  A1c 7.4, down from 7.5 at last check.  Continue glipizide 10 mg daily with breakfast.  Encouraged him to limit sweets and fried foods in addition to good medication compliance.  Will repeat A1c and labs before next follow-up visit.  Orders: -     POCT glycosylated hemoglobin (Hb A1C) -     POCT UA - Microalbumin -     glipiZIDE ER; TAKE 1 TABLET BY MOUTH DAILY WITH BREAKFAST  Dispense: 90 tablet; Refill: 1  Hyperlipidemia associated with type 2 diabetes mellitus (HCC) Assessment & Plan: Last lipid panel: LDL 103, HDL 49, triglycerides 74.  We will recheck lipid panel before next appointment.  Until then, encouraged to follow heart healthy diet low in fat.  If lipids continue to remain elevated, recommend  starting statin medication.   Chronic obstructive pulmonary disease, unspecified COPD type (HCC) Assessment & Plan: Stable.  Patient does not wish to use any maintenance medication as he does not feel that he needs it.   Decreased urine stream -     PSA; Future  Patient unable to leave urine sample for urinalysis today.  We will start with PSA as last PSA lab was on 05/26/2021 with a value of 1.2.  Return in about 3 months (around 10/15/2023) for follow-up for HTN, HLD, DM, COPD, fasting blood work 1 week before.    Joshua Quitter, PA

## 2023-07-15 NOTE — Assessment & Plan Note (Signed)
Stable.  Continue amlodipine 10 mg and valsartan 40 mg daily.  Will collect labs including CMP for electrolytes and renal function at next follow-up visit.

## 2023-07-15 NOTE — Assessment & Plan Note (Signed)
Stable.  Patient does not wish to use any maintenance medication as he does not feel that he needs it.

## 2023-07-15 NOTE — Assessment & Plan Note (Signed)
A1c 7.4, down from 7.5 at last check.  Continue glipizide 10 mg daily with breakfast.  Encouraged him to limit sweets and fried foods in addition to good medication compliance.  Will repeat A1c and labs before next follow-up visit.

## 2023-08-05 ENCOUNTER — Encounter (INDEPENDENT_AMBULATORY_CARE_PROVIDER_SITE_OTHER): Payer: Self-pay

## 2023-08-05 ENCOUNTER — Encounter (INDEPENDENT_AMBULATORY_CARE_PROVIDER_SITE_OTHER): Payer: Medicare Other | Admitting: Ophthalmology

## 2023-08-05 DIAGNOSIS — H35413 Lattice degeneration of retina, bilateral: Secondary | ICD-10-CM

## 2023-08-05 DIAGNOSIS — Z961 Presence of intraocular lens: Secondary | ICD-10-CM

## 2023-08-05 DIAGNOSIS — H353132 Nonexudative age-related macular degeneration, bilateral, intermediate dry stage: Secondary | ICD-10-CM

## 2023-08-05 DIAGNOSIS — E119 Type 2 diabetes mellitus without complications: Secondary | ICD-10-CM

## 2023-09-27 ENCOUNTER — Other Ambulatory Visit: Payer: Self-pay | Admitting: Family Medicine

## 2023-09-27 DIAGNOSIS — E1169 Type 2 diabetes mellitus with other specified complication: Secondary | ICD-10-CM

## 2023-09-27 DIAGNOSIS — I82439 Acute embolism and thrombosis of unspecified popliteal vein: Secondary | ICD-10-CM

## 2023-09-27 DIAGNOSIS — E1165 Type 2 diabetes mellitus with hyperglycemia: Secondary | ICD-10-CM

## 2023-09-27 DIAGNOSIS — I10 Essential (primary) hypertension: Secondary | ICD-10-CM

## 2023-10-08 ENCOUNTER — Other Ambulatory Visit: Payer: Medicare Other

## 2023-10-08 DIAGNOSIS — I82439 Acute embolism and thrombosis of unspecified popliteal vein: Secondary | ICD-10-CM

## 2023-10-08 DIAGNOSIS — E1165 Type 2 diabetes mellitus with hyperglycemia: Secondary | ICD-10-CM

## 2023-10-08 DIAGNOSIS — E1169 Type 2 diabetes mellitus with other specified complication: Secondary | ICD-10-CM

## 2023-10-08 DIAGNOSIS — I10 Essential (primary) hypertension: Secondary | ICD-10-CM

## 2023-10-09 LAB — CBC WITH DIFFERENTIAL/PLATELET
Basophils Absolute: 0 10*3/uL (ref 0.0–0.2)
Basos: 0 %
EOS (ABSOLUTE): 0.5 10*3/uL — ABNORMAL HIGH (ref 0.0–0.4)
Eos: 6 %
Hematocrit: 45.6 % (ref 37.5–51.0)
Hemoglobin: 14.7 g/dL (ref 13.0–17.7)
Immature Grans (Abs): 0 10*3/uL (ref 0.0–0.1)
Immature Granulocytes: 0 %
Lymphocytes Absolute: 1.9 10*3/uL (ref 0.7–3.1)
Lymphs: 22 %
MCH: 30.5 pg (ref 26.6–33.0)
MCHC: 32.2 g/dL (ref 31.5–35.7)
MCV: 95 fL (ref 79–97)
Monocytes Absolute: 0.8 10*3/uL (ref 0.1–0.9)
Monocytes: 9 %
Neutrophils Absolute: 5.6 10*3/uL (ref 1.4–7.0)
Neutrophils: 63 %
RBC: 4.82 x10E6/uL (ref 4.14–5.80)
RDW: 12.7 % (ref 11.6–15.4)
WBC: 9 10*3/uL (ref 3.4–10.8)

## 2023-10-09 LAB — HEMOGLOBIN A1C
Est. average glucose Bld gHb Est-mCnc: 171 mg/dL
Hgb A1c MFr Bld: 7.6 % — ABNORMAL HIGH (ref 4.8–5.6)

## 2023-10-09 LAB — COMPREHENSIVE METABOLIC PANEL
ALT: 11 [IU]/L (ref 0–44)
AST: 13 [IU]/L (ref 0–40)
Albumin: 4.2 g/dL (ref 3.7–4.7)
Alkaline Phosphatase: 108 [IU]/L (ref 44–121)
BUN/Creatinine Ratio: 13 (ref 10–24)
BUN: 15 mg/dL (ref 8–27)
Bilirubin Total: 0.6 mg/dL (ref 0.0–1.2)
CO2: 21 mmol/L (ref 20–29)
Calcium: 9.4 mg/dL (ref 8.6–10.2)
Chloride: 107 mmol/L — ABNORMAL HIGH (ref 96–106)
Creatinine, Ser: 1.14 mg/dL (ref 0.76–1.27)
Globulin, Total: 2.1 g/dL (ref 1.5–4.5)
Glucose: 144 mg/dL — ABNORMAL HIGH (ref 70–99)
Potassium: 4.6 mmol/L (ref 3.5–5.2)
Sodium: 143 mmol/L (ref 134–144)
Total Protein: 6.3 g/dL (ref 6.0–8.5)
eGFR: 63 mL/min/{1.73_m2} (ref >59)

## 2023-10-09 LAB — LIPID PANEL
Chol/HDL Ratio: 3.3 ratio (ref 0.0–5.0)
Cholesterol, Total: 181 mg/dL (ref 100–199)
HDL: 55 mg/dL (ref >39)
LDL Chol Calc (NIH): 110 mg/dL — ABNORMAL HIGH (ref 0–99)
Triglycerides: 90 mg/dL (ref 0–149)
VLDL Cholesterol Cal: 16 mg/dL (ref 5–40)

## 2023-10-15 ENCOUNTER — Encounter: Payer: Self-pay | Admitting: Family Medicine

## 2023-10-15 ENCOUNTER — Ambulatory Visit (INDEPENDENT_AMBULATORY_CARE_PROVIDER_SITE_OTHER): Payer: Medicare Other | Admitting: Family Medicine

## 2023-10-15 VITALS — BP 148/75 | HR 73 | Resp 18 | Ht 73.0 in | Wt 208.0 lb

## 2023-10-15 DIAGNOSIS — E785 Hyperlipidemia, unspecified: Secondary | ICD-10-CM

## 2023-10-15 DIAGNOSIS — E1169 Type 2 diabetes mellitus with other specified complication: Secondary | ICD-10-CM | POA: Diagnosis not present

## 2023-10-15 DIAGNOSIS — Z7984 Long term (current) use of oral hypoglycemic drugs: Secondary | ICD-10-CM

## 2023-10-15 DIAGNOSIS — Z23 Encounter for immunization: Secondary | ICD-10-CM | POA: Diagnosis not present

## 2023-10-15 DIAGNOSIS — I10 Essential (primary) hypertension: Secondary | ICD-10-CM | POA: Diagnosis not present

## 2023-10-15 DIAGNOSIS — J449 Chronic obstructive pulmonary disease, unspecified: Secondary | ICD-10-CM

## 2023-10-15 DIAGNOSIS — E1165 Type 2 diabetes mellitus with hyperglycemia: Secondary | ICD-10-CM

## 2023-10-15 LAB — POCT GLYCOSYLATED HEMOGLOBIN (HGB A1C): HbA1c POC (<> result, manual entry): 7.2 % (ref 4.0–5.6)

## 2023-10-15 MED ORDER — ROSUVASTATIN CALCIUM 5 MG PO TABS: 5.0000 mg | ORAL_TABLET | Freq: Every day | ORAL | 2 refills | Status: DC

## 2023-10-15 NOTE — Assessment & Plan Note (Addendum)
Last lipid panel: LDL 110, HDL 55, triglycerides 90.  Given risk factors, agreeable to starting low-dose rosuvastatin 5 mg daily.  Continue to follow heart healthy diet low in fat.  Will continue to monitor.

## 2023-10-15 NOTE — Assessment & Plan Note (Signed)
Stable.  Patient does not wish to use any maintenance medication as he does not feel that he needs it.

## 2023-10-15 NOTE — Assessment & Plan Note (Signed)
A1c 7.2, at goal <7.5.  Continue glipizide 10 mg daily with breakfast.  Continue limiting sweets and fried foods.  Will continue to monitor.

## 2023-10-15 NOTE — Patient Instructions (Addendum)
Blood pressure goal: Less than 130 on top AND Less than 80 on the bottom  Check your blood pressure 2-3 times a day for the next 2 weeks and keep a log.  Bring the log to your blood pressure visit.

## 2023-10-15 NOTE — Progress Notes (Signed)
Established Patient Office Visit  Subjective   Patient ID: Joshua [name], male    DOB: 1936-05-11  Age: 87 y.o. MRN: [id number]  Chief Complaint  Patient presents with   Diabetes   Hyperlipidemia   Hypertension   COPD    HPI Joshua [name] is a 87 y.o. male presenting today for follow up of hypertension, hyperlipidemia, diabetes, COPD.  He would like to get his flu shot today. Hypertension:  Pt denies chest pain, SOB, dizziness, edema, syncope, fatigue or heart palpitations. Taking amlodipine and valsartan, reports excellent compliance with treatment. Denies side effects. Hyperlipidemia: Currently consuming a low fat diet.  The ASCVD Risk score (Arnett DK, et al., 2019) failed to calculate for the following reasons:   The 2019 ASCVD risk score is only valid for ages 61 to 27 Diabetes: denies hypoglycemic events, wounds or sores that are not healing well, increased thirst or urination. Denies vision problems, eye exam ordered May 2024.  Taking glipizide as prescribed without any side effects.  COPD: Not currently on maintenance medication but does have albuterol rescue inhaler to use as needed.  Outpatient Medications Prior to Visit  Medication Sig   ACCU-CHEK AVIVA PLUS test strip 1 each by Other route daily.   acetaminophen (TYLENOL) 500 MG tablet Take 2 tablets (1,000 mg total) by mouth every 6 (six) hours as needed.   albuterol (PROAIR HFA) 108 (90 Base) MCG/ACT inhaler Inhale 2 puffs into the lungs every 4 (four) hours as needed for wheezing or shortness of breath.   amLODipine (NORVASC) 10 MG tablet Take 1 tablet (10 mg total) by mouth daily.   celecoxib (CELEBREX) 200 MG capsule TAKE 1 CAPSULE BY MOUTH DAILY   gabapentin (NEURONTIN) 100 MG capsule Take 1 capsule (100 mg total) by mouth 3 (three) times daily.   glipiZIDE (GLUCOTROL XL) 10 MG 24 hr tablet TAKE 1 TABLET BY MOUTH DAILY WITH BREAKFAST   Multiple Vitamins-Minerals (PRESERVISION AREDS 2) CAPS Take 1 Capful by mouth  daily.   sodium chloride (OCEAN) 0.65 % SOLN nasal spray Place 1 spray into both nostrils as needed for congestion.   valsartan (DIOVAN) 40 MG tablet Take 1 tablet (40 mg total) by mouth daily.   No facility-administered medications prior to visit.    ROS Negative unless otherwise noted in HPI   Objective:     BP (!) 148/75 (BP Location: Left Arm, Patient Position: Sitting, Cuff Size: Normal)   Pulse 73   Resp 18   Ht 6\' 1"  (1.854 m)   Wt 208 lb (94.3 kg)   SpO2 95%   BMI 27.44 kg/m   Physical Exam Constitutional:      General: He is not in acute distress.    Appearance: Normal appearance.  HENT:     Head: Normocephalic and atraumatic.  Cardiovascular:     Rate and Rhythm: Normal rate and regular rhythm.     Heart sounds: Normal heart sounds. No murmur heard.    No friction rub. No gallop.  Pulmonary:     Effort: Pulmonary effort is normal. No respiratory distress.     Breath sounds: Normal breath sounds. No wheezing, rhonchi or rales.  Skin:    General: Skin is warm and dry.  Neurological:     Mental Status: He is alert and oriented to person, place, and time.  Psychiatric:        Mood and Affect: Mood normal.    Diabetic Foot Exam - Simple   Simple Foot Form  Diabetic Foot exam was performed with the following findings: Yes 10/15/2023  2:00 PM  Visual Inspection No deformities, no ulcerations, no other skin breakdown bilaterally: Yes Sensation Testing See comments: Yes Pulse Check Posterior Tibialis and Dorsalis pulse intact bilaterally: Yes Comments Only dorsal aspect monofilament test spot intact to touch bilaterally Toenails thick and yellow    Results for orders placed or performed in visit on 10/15/23  POCT HgB A1C  Result Value Ref Range   Hemoglobin A1C     HbA1c POC (<> result, manual entry) 7.2 4.0 - 5.6 %   HbA1c, POC (prediabetic range)     HbA1c, POC (controlled diabetic range)       Assessment & Plan:  Essential hypertension Assessment  & Plan: BP goal <130/80.  Blood pressure above goal, he has not been checking at home.  Continue amlodipine 10 mg and valsartan 40 mg daily.  Keep blood pressure log for the next 2 weeks at home and bring to nurse visit.  Electrolytes and renal function within normal limits.  Will continue to monitor.   Hyperlipidemia associated with type 2 diabetes mellitus (HCC) Assessment & Plan: Last lipid panel: LDL 110, HDL 55, triglycerides 90.  Given risk factors, agreeable to starting low-dose rosuvastatin 5 mg daily.  Continue to follow heart healthy diet low in fat.  Will continue to monitor.  Orders: -     Rosuvastatin Calcium; Take 1 tablet (5 mg total) by mouth daily.  Dispense: 30 tablet; Refill: 2  Type 2 diabetes mellitus with hyperglycemia, without long-term current use of insulin (HCC) Assessment & Plan: A1c 7.2, at goal <7.5.  Continue glipizide 10 mg daily with breakfast.  Continue limiting sweets and fried foods.  Will continue to monitor.  Orders: -     POCT glycosylated hemoglobin (Hb A1C) -     Rosuvastatin Calcium; Take 1 tablet (5 mg total) by mouth daily.  Dispense: 30 tablet; Refill: 2  Chronic obstructive pulmonary disease, unspecified COPD type (HCC) Assessment & Plan: Stable.  Patient does not wish to use any maintenance medication as he does not feel that he needs it.   Need for influenza vaccination -     Flu Vaccine Trivalent High Dose (Fluad)   Return in 2 weeks for blood pressure check and to leave urine sample, he was unable to void for UACR today. Return in about 3 months (around 01/15/2024) for follow-up for HTN, HLD, DM, fasting blood work 1 week before.    [name], PA

## 2023-10-15 NOTE — Assessment & Plan Note (Addendum)
BP goal <130/80.  Blood pressure above goal, he has not been checking at home.  Continue amlodipine 10 mg and valsartan 40 mg daily.  Keep blood pressure log for the next 2 weeks at home and bring to nurse visit.  Electrolytes and renal function within normal limits.  Will continue to monitor.

## 2023-10-22 ENCOUNTER — Encounter: Payer: Self-pay | Admitting: Family Medicine

## 2023-10-29 ENCOUNTER — Ambulatory Visit (INDEPENDENT_AMBULATORY_CARE_PROVIDER_SITE_OTHER): Payer: Medicare Other | Admitting: Family Medicine

## 2023-10-29 VITALS — BP 124/73 | HR 142

## 2023-10-29 DIAGNOSIS — I10 Essential (primary) hypertension: Secondary | ICD-10-CM

## 2023-10-29 NOTE — Progress Notes (Signed)
Pt denies CP, SOB, dizziness, or heart palpitations. taking meds as directed without problems. Denies med side effects. 5 min spent with pt. 

## 2023-12-16 ENCOUNTER — Ambulatory Visit: Payer: Medicare Other | Admitting: Internal Medicine

## 2023-12-24 ENCOUNTER — Ambulatory Visit: Payer: HMO | Attending: Internal Medicine | Admitting: Internal Medicine

## 2023-12-24 ENCOUNTER — Other Ambulatory Visit: Payer: Self-pay

## 2023-12-24 ENCOUNTER — Encounter: Payer: Self-pay | Admitting: Internal Medicine

## 2023-12-24 VITALS — BP 130/70 | HR 92 | Ht 72.0 in | Wt 208.4 lb

## 2023-12-24 DIAGNOSIS — I82439 Acute embolism and thrombosis of unspecified popliteal vein: Secondary | ICD-10-CM

## 2023-12-24 DIAGNOSIS — I10 Essential (primary) hypertension: Secondary | ICD-10-CM | POA: Diagnosis not present

## 2023-12-24 DIAGNOSIS — E1165 Type 2 diabetes mellitus with hyperglycemia: Secondary | ICD-10-CM

## 2023-12-24 DIAGNOSIS — E1169 Type 2 diabetes mellitus with other specified complication: Secondary | ICD-10-CM

## 2023-12-24 MED ORDER — METOPROLOL SUCCINATE ER 25 MG PO TB24: 25.0000 mg | ORAL_TABLET | Freq: Every day | ORAL | 3 refills | Status: AC

## 2023-12-24 MED ORDER — APIXABAN 5 MG PO TABS: 5.0000 mg | ORAL_TABLET | Freq: Two times a day (BID) | ORAL | 11 refills | Status: DC

## 2023-12-24 NOTE — Patient Instructions (Signed)
 Medication Instructions:  Start Eliquis  5 mg twice day Metoprolol  Succinate 25 mg daily  *If you need a refill on your cardiac medications before your next appointment, please call your pharmacy*   Lab Work: NONE ordered at this time of appointment   Testing/Procedures: NONE ordered at this time of appointment   Follow-Up: At [facility], you and your health needs are our priority.  As part of our continuing mission to provide you with exceptional heart care, we have created designated Provider Care Teams.  These Care Teams include your primary Cardiologist (physician) and Advanced Practice Providers (APPs -  Physician Assistants and Nurse Practitioners) who all work together to provide you with the care you need, when you need it.  We recommend signing up for the patient portal called MyChart.  Sign up information is provided on this After Visit Summary.  MyChart is used to connect with patients for Virtual Visits (Telemedicine).  Patients are able to view lab/test results, encounter notes, upcoming appointments, etc.  Non-urgent messages can be sent to your provider as well.   To learn more about what you can do with MyChart, go to [email or web address].    Your next appointment:   3 month(s)  Provider:   Any APP

## 2023-12-24 NOTE — Progress Notes (Signed)
 Cardiology Office Note:    Date:  12/24/2023   ID:  Joshua [name], DOB October 01, 1936, MRN [id number]  PCP:  Joshua Joesph LABOR, PA   [facility] Providers Cardiologist:  Joshua Ronal BRAVO, MD     Referring MD: Joshua Joesph LABOR, PA   No chief complaint on file. DVT, c/f aortic aneurysm  History of Present Illness:    Joshua [name] is a 88 y.o. male with a hx of diabetes mellitus type 2 well controlled, COPD, hypertension, chronic back pain, AAA, and chronic DVT not on [facility] per family decision, here to discuss management of AAA  Joshua [name] presents after a hospitalization admited on 09/23/2021 with gangrene cholecystitis. He is s/p chole. He was found to have chronic left popliteal. He notes a leg break as a teenager. He's had colon CA screening.  His hgb is normal. The team had a family discussion regarding his DVT and the family opted to not pursue AC 2/2 falls at home. He was noted to have small AAA 3.0 cm. In he DC summary, the team discussed palliative care with the family in terms of his care. He was referred to cardiology to help manage. He is on O2. He is using spirometer. His ct showed trace BL effusions. Some atelectasis with possible PNA. He had no PE. No chest pain.He is ambulating fine with O2.  He finished antibiotics with c/f pna. He was noted to have possible vegetation on his echo. TEE was negative. He otherwise had normal LV/RV function. No valve abnormalities. No pulmonary HTN. He was discharged 09/29/2021.  Today he comes in with his daughter who was not with him in the hospital. They have not all discussed plans for anticoagulation. Otherwise, he has no chest pain, sob, le edema, pnd or orthopnea. He is still on O2.  Interim hx 12/24/2023 He is in atrial flutter today. New diagnosis. Takes goody powder.  He feels his heart skipping but otherwise does not feel any heart racing.  He denies any bleeding.  He thinks he had a prior ulcer but has had no GI bleeds.  In the past he  had multiple falls and so we deferred anticoagulation for his DVT; however he is doing better and is not having issues with falls at this time.   Current Medications: Current Meds  Medication Sig   ACCU-CHEK AVIVA PLUS test strip 1 each by Other route daily.   acetaminophen  (TYLENOL ) 500 MG tablet Take 2 tablets (1,000 mg total) by mouth every 6 (six) hours as needed.   albuterol  (PROAIR  HFA) 108 (90 Base) MCG/ACT inhaler Inhale 2 puffs into the lungs every 4 (four) hours as needed for wheezing or shortness of breath.   amLODipine  (NORVASC ) 10 MG tablet Take 1 tablet (10 mg total) by mouth daily.   celecoxib  (CELEBREX ) 200 MG capsule TAKE 1 CAPSULE BY MOUTH DAILY   gabapentin  (NEURONTIN ) 100 MG capsule Take 1 capsule (100 mg total) by mouth 3 (three) times daily.   glipiZIDE  (GLUCOTROL  XL) 10 MG 24 hr tablet TAKE 1 TABLET BY MOUTH DAILY WITH BREAKFAST   Multiple Vitamins-Minerals (PRESERVISION AREDS 2) CAPS Take 1 Capful by mouth daily.   rosuvastatin  (CRESTOR ) 5 MG tablet Take 1 tablet (5 mg total) by mouth daily.   sodium chloride  (OCEAN) 0.65 % SOLN nasal spray Place 1 spray into both nostrils as needed for congestion.   valsartan  (DIOVAN ) 40 MG tablet Take 1 tablet (40 mg total) by mouth daily.     Allergies:  Patient has no known allergies.      Family History: The patient's family history includes Alcohol abuse in his father; Cancer in his father; Diabetes in his father; Heart attack in his father; Strabismus in his daughter.  ROS:   Please see the history of present illness.     All other systems reviewed and are negative.  EKGs/Labs/Other Studies Reviewed:    The following studies were reviewed today:   EKG:  EKG is  ordered today.  The ekg ordered today demonstrates   EKG Interpretation Date/Time:  Tuesday December 24 2023 15:24:26 EST Ventricular Rate:  92 PR Interval:    QRS Duration:  64 QT Interval:  344 QTC Calculation: 425 R Axis:   -43  Text  Interpretation: Atrial flutter with variable A-V block Left axis deviation Pulmonary disease pattern Septal infarct (cited on or before 23-Sep-2021) When compared with ECG of 23-Sep-2021 20:08, Atrial flutter has replaced Sinus rhythm Confirmed by Joshua [name] ([id number]) on 12/24/2023 3:36:46 PM   NSR, inferior q waves  Recent Labs: 04/05/2023: TSH 1.660 10/08/2023: ALT 11; BUN 15; Creatinine, Ser 1.14; Hemoglobin 14.7; Platelets CANCELED; Potassium 4.6; Sodium 143  Recent Lipid Panel    Component Value Date/Time   CHOL 181 10/08/2023 0910   TRIG 90 10/08/2023 0910   HDL 55 10/08/2023 0910   CHOLHDL 3.3 10/08/2023 0910   LDLCALC 110 (H) 10/08/2023 0910     Risk Assessment/Calculations:     CHA2DS2-VASc Score = 3   This indicates a 3.2% annual risk of stroke. The patient's score is based upon: CHF History: 0 HTN History: 1 Diabetes History: 0 Stroke History: 0 Vascular Disease History: 0 Age Score: 2 Gender Score: 0           Physical Exam:    VS:  BP 130/70 (BP Location: Left Arm, Patient Position: Sitting, Cuff Size: Large)   Pulse 92   Ht 6' (1.829 m)   Wt 208 lb 6.4 oz (94.5 kg)   SpO2 97%   BMI 28.26 kg/m     Wt Readings from Last 3 Encounters:  12/24/23 208 lb 6.4 oz (94.5 kg)  10/15/23 208 lb (94.3 kg)  07/15/23 209 lb (94.8 kg)     GEN:  Well nourished, well developed in no acute distress. Sitting in a wheel chair HEENT: Normal NECK: No JVD; No carotid bruits LYMPHATICS: No lymphadenopathy CARDIAC: RRR, no murmurs, rubs, gallops RESPIRATORY:  Nl wob on O2. Mild decreased BS in the bases.  No rales, wheezing or rhonchi  ABDOMEN: Soft, non-tender, non-distended MUSCULOSKELETAL:  No edema; No deformity  SKIN: Warm and dry NEUROLOGIC:  Alert and oriented x 3 PSYCHIATRIC:  Normal affect   ASSESSMENT and PLAN  New Onset atrial flutter. TTE EF 65-70% , no significant valve disease just mild MR. He had a mobile echodensity that was not consistent with  infective endocarditis on TEE.  His left atrium is moderately enlarged, his LAA emptying velocity was good at 45 cm/s.  He is not having any symptoms.  Will manage with rate control for now. -Started metoprolol  25 mg Xl daily - start Eliquis  5 mg BID [we discussed the importance of avoiding Goody powder or significant Advil while on this blood thinner]; if he is having bleeding issues can consider him for a watchman.  HTN -Continue amlodipine  10 mg daily and valsartan  40 mg daily  HLD Continue Crestor  5 mg daily  DVT hx - was not on AC due to falls in the past  however his daughter notes that this has resolved and he is doing better.  So we will restart Eliquis  per above.    Dispo:    In order of problems listed above:   Follow up with an APP in 3 months       Medication Adjustments/Labs and Tests Ordered: Current medicines are reviewed at length with the patient today.  Concerns regarding medicines are outlined above.  Orders Placed This Encounter  Procedures   EKG 12-Lead    Signed, Joshua Ronal BRAVO, MD  12/24/2023 3:40 PM    [facility]

## 2023-12-26 ENCOUNTER — Telehealth: Payer: Self-pay

## 2023-12-26 DIAGNOSIS — I10 Essential (primary) hypertension: Secondary | ICD-10-CM

## 2023-12-26 DIAGNOSIS — E1165 Type 2 diabetes mellitus with hyperglycemia: Secondary | ICD-10-CM

## 2023-12-26 DIAGNOSIS — E1169 Type 2 diabetes mellitus with other specified complication: Secondary | ICD-10-CM

## 2023-12-26 MED ORDER — ROSUVASTATIN CALCIUM 5 MG PO TABS: 5.0000 mg | ORAL_TABLET | Freq: Every day | ORAL | 2 refills | Status: DC

## 2023-12-26 MED ORDER — GLIPIZIDE ER 10 MG PO TB24: ORAL_TABLET | ORAL | 1 refills | Status: DC

## 2023-12-26 MED ORDER — AMLODIPINE BESYLATE 10 MG PO TABS: 10.0000 mg | ORAL_TABLET | Freq: Every day | ORAL | 1 refills | Status: DC

## 2023-12-26 MED ORDER — VALSARTAN 40 MG PO TABS: 40.0000 mg | ORAL_TABLET | Freq: Every day | ORAL | 1 refills | Status: DC

## 2023-12-26 NOTE — Addendum Note (Signed)
Addended by: Saralyn Pilar on: 12/26/2023 03:07 PM   Modules accepted: Orders

## 2023-12-26 NOTE — Telephone Encounter (Signed)
Copied from CRM [id number]. Topic: Clinical - Medication Question >> Dec 26, 2023  2:45 PM Joanette Gula wrote: Patient has his appointment on 01/14/24 for a med refill visit.. however he only has 4 of these left.....   ( glipiZIDE (GLUCOTROL XL) 10 MG 24 hr tablet.... rosuvastatin (CRESTOR) 5 MG tablet....  amLODipine (NORVASC) 10 MG tablet... & valsartan (DIOVAN) 40 MG tablet... There  are no more refills left.

## 2023-12-26 NOTE — Telephone Encounter (Signed)
Meds ordered this encounter  Medications   glipiZIDE (GLUCOTROL XL) 10 MG 24 hr tablet    Sig: TAKE 1 TABLET BY MOUTH DAILY WITH BREAKFAST    Dispense:  90 tablet    Refill:  1    Supervising Provider:   Sandre Kitty [[id number]]   rosuvastatin (CRESTOR) 5 MG tablet    Sig: Take 1 tablet (5 mg total) by mouth daily.    Dispense:  30 tablet    Refill:  2    Supervising Provider:   Sandre Kitty [[id number]]   amLODipine (NORVASC) 10 MG tablet    Sig: Take 1 tablet (10 mg total) by mouth daily.    Dispense:  90 tablet    Refill:  1    Supervising Provider:   Sandre Kitty [[id number]]   valsartan (DIOVAN) 40 MG tablet    Sig: Take 1 tablet (40 mg total) by mouth daily.    Dispense:  90 tablet    Refill:  1    Supervising Provider:   Sandre Kitty [[id number]]

## 2024-01-08 ENCOUNTER — Other Ambulatory Visit: Payer: HMO

## 2024-01-08 DIAGNOSIS — E785 Hyperlipidemia, unspecified: Secondary | ICD-10-CM

## 2024-01-08 DIAGNOSIS — E1165 Type 2 diabetes mellitus with hyperglycemia: Secondary | ICD-10-CM | POA: Diagnosis not present

## 2024-01-08 DIAGNOSIS — E1169 Type 2 diabetes mellitus with other specified complication: Secondary | ICD-10-CM | POA: Diagnosis not present

## 2024-01-08 DIAGNOSIS — I10 Essential (primary) hypertension: Secondary | ICD-10-CM

## 2024-01-08 DIAGNOSIS — I82439 Acute embolism and thrombosis of unspecified popliteal vein: Secondary | ICD-10-CM

## 2024-01-09 ENCOUNTER — Encounter: Payer: Self-pay | Admitting: Family Medicine

## 2024-01-09 LAB — CBC WITH DIFFERENTIAL/PLATELET
Basophils Absolute: 0 10*3/uL (ref 0.0–0.2)
Basos: 0 %
EOS (ABSOLUTE): 0.6 10*3/uL — ABNORMAL HIGH (ref 0.0–0.4)
Eos: 7 %
Hematocrit: 43.8 % (ref 37.5–51.0)
Hemoglobin: 14.2 g/dL (ref 13.0–17.7)
Immature Grans (Abs): 0 10*3/uL (ref 0.0–0.1)
Immature Granulocytes: 0 %
Lymphocytes Absolute: 1.7 10*3/uL (ref 0.7–3.1)
Lymphs: 19 %
MCH: 30 pg (ref 26.6–33.0)
MCHC: 32.4 g/dL (ref 31.5–35.7)
MCV: 93 fL (ref 79–97)
Monocytes Absolute: 0.8 10*3/uL (ref 0.1–0.9)
Monocytes: 9 %
Neutrophils Absolute: 5.9 10*3/uL (ref 1.4–7.0)
Neutrophils: 65 %
RBC: 4.73 x10E6/uL (ref 4.14–5.80)
RDW: 12.4 % (ref 11.6–15.4)
WBC: 9 10*3/uL (ref 3.4–10.8)

## 2024-01-09 LAB — COMPREHENSIVE METABOLIC PANEL
ALT: 6 [IU]/L (ref 0–44)
AST: 11 [IU]/L (ref 0–40)
Albumin: 4.2 g/dL (ref 3.7–4.7)
Alkaline Phosphatase: 84 [IU]/L (ref 44–121)
BUN/Creatinine Ratio: 11 (ref 10–24)
BUN: 13 mg/dL (ref 8–27)
Bilirubin Total: 0.6 mg/dL (ref 0.0–1.2)
CO2: 22 mmol/L (ref 20–29)
Calcium: 9.2 mg/dL (ref 8.6–10.2)
Chloride: 104 mmol/L (ref 96–106)
Creatinine, Ser: 1.18 mg/dL (ref 0.76–1.27)
Globulin, Total: 2.4 g/dL (ref 1.5–4.5)
Glucose: 172 mg/dL — ABNORMAL HIGH (ref 70–99)
Potassium: 4.5 mmol/L (ref 3.5–5.2)
Sodium: 142 mmol/L (ref 134–144)
Total Protein: 6.6 g/dL (ref 6.0–8.5)
eGFR: 60 mL/min/{1.73_m2} (ref >59)

## 2024-01-09 LAB — LIPID PANEL
Chol/HDL Ratio: 2.3 {ratio} (ref 0.0–5.0)
Cholesterol, Total: 115 mg/dL (ref 100–199)
HDL: 50 mg/dL (ref >39)
LDL Chol Calc (NIH): 49 mg/dL (ref 0–99)
Triglycerides: 83 mg/dL (ref 0–149)
VLDL Cholesterol Cal: 16 mg/dL (ref 5–40)

## 2024-01-09 LAB — HEMOGLOBIN A1C
Est. average glucose Bld gHb Est-mCnc: 194 mg/dL
Hgb A1c MFr Bld: 8.4 % — ABNORMAL HIGH (ref 4.8–5.6)

## 2024-01-15 ENCOUNTER — Ambulatory Visit (INDEPENDENT_AMBULATORY_CARE_PROVIDER_SITE_OTHER): Payer: HMO | Admitting: Family Medicine

## 2024-01-15 ENCOUNTER — Encounter: Payer: Self-pay | Admitting: Family Medicine

## 2024-01-15 VITALS — BP 152/72 | HR 82 | Ht 72.0 in | Wt 209.8 lb

## 2024-01-15 DIAGNOSIS — I152 Hypertension secondary to endocrine disorders: Secondary | ICD-10-CM

## 2024-01-15 DIAGNOSIS — E1165 Type 2 diabetes mellitus with hyperglycemia: Secondary | ICD-10-CM | POA: Diagnosis not present

## 2024-01-15 DIAGNOSIS — Z7984 Long term (current) use of oral hypoglycemic drugs: Secondary | ICD-10-CM | POA: Diagnosis not present

## 2024-01-15 DIAGNOSIS — E1169 Type 2 diabetes mellitus with other specified complication: Secondary | ICD-10-CM | POA: Diagnosis not present

## 2024-01-15 DIAGNOSIS — M1712 Unilateral primary osteoarthritis, left knee: Secondary | ICD-10-CM | POA: Diagnosis not present

## 2024-01-15 DIAGNOSIS — E1159 Type 2 diabetes mellitus with other circulatory complications: Secondary | ICD-10-CM | POA: Diagnosis not present

## 2024-01-15 DIAGNOSIS — E785 Hyperlipidemia, unspecified: Secondary | ICD-10-CM

## 2024-01-15 MED ORDER — CELECOXIB 200 MG PO CAPS: 200.0000 mg | ORAL_CAPSULE | Freq: Every day | ORAL | 2 refills | Status: DC

## 2024-01-15 NOTE — Assessment & Plan Note (Addendum)
 BP goal <130/80.  Above goal, first recommend changing blood pressure medication to morning dosing.  Continue amlodipine  10 mg and valsartan  40 mg daily.  Recheck blood pressure at nurse visit in 2 weeks.  If still above goal, increase valsartan  dose.  Electrolytes and renal function within normal limits.  Will continue to monitor.

## 2024-01-15 NOTE — Assessment & Plan Note (Addendum)
A1c increased to 8.4 from 7.2, above goal <7.5.  Discussed that he needs to take his medication exactly as prescribed.  Resume glipizide 10 mg daily with breakfast.  Continue limiting sweets and fried foods.  Will continue to monitor.

## 2024-01-15 NOTE — Patient Instructions (Addendum)
 Start taking your blood pressure medicines in the MORNING. Start taking a FULL tablet of glipizide .  For pain, take Tylenol . If your pain is not controlled with Tylenol , you can take Celebrex  INSTEAD of Advil for pain.

## 2024-01-15 NOTE — Progress Notes (Signed)
 Established Patient Office Visit  Subjective   Patient ID: Joshua [name], male    DOB: December 29, 1935  Age: 88 y.o. MRN: [id number]  Chief Complaint  Patient presents with   Hypertension   Hyperlipidemia   Diabetes    HPI Joshua [name] is a 88 y.o. male presenting today for follow up of hypertension, hyperlipidemia, diabetes. Hypertension: Patient here for follow-up of elevated blood pressure. Pt denies chest pain, SOB, dizziness, edema, syncope, fatigue or heart palpitations. Taking valsartan  and amlodipine , reports excellent compliance with treatment. Denies side effects.  He is not checking blood pressure at home.  Currently takes blood pressure medication at bedtime. Hyperlipidemia: tolerating rosuvastatin  5 mg daily well with no myalgias or significant side effects.  The ASCVD Risk score (Arnett DK, et al., 2019) failed to calculate for the following reasons:   The 2019 ASCVD risk score is only valid for ages 28 to 46 Diabetes: denies hypoglycemic events, wounds or sores that are not healing well, increased thirst or urination. Denies vision problems, eye exam overdue.  He states that he is only taking half of his prescribed glipizide  10 mg daily which is likely why his A1c is elevated.      Outpatient Medications Prior to Visit  Medication Sig   ACCU-CHEK AVIVA PLUS test strip 1 each by Other route daily.   acetaminophen  (TYLENOL ) 500 MG tablet Take 2 tablets (1,000 mg total) by mouth every 6 (six) hours as needed.   albuterol  (PROAIR  HFA) 108 (90 Base) MCG/ACT inhaler Inhale 2 puffs into the lungs every 4 (four) hours as needed for wheezing or shortness of breath.   amLODipine  (NORVASC ) 10 MG tablet Take 1 tablet (10 mg total) by mouth daily.   apixaban  (ELIQUIS ) 5 MG TABS tablet Take 1 tablet (5 mg total) by mouth 2 (two) times daily.   gabapentin  (NEURONTIN ) 100 MG capsule Take 1 capsule (100 mg total) by mouth 3 (three) times daily.   glipiZIDE  (GLUCOTROL  XL) 10 MG 24 hr  tablet TAKE 1 TABLET BY MOUTH DAILY WITH BREAKFAST   metoprolol  succinate (TOPROL -XL) 25 MG 24 hr tablet Take 1 tablet (25 mg total) by mouth daily.   Multiple Vitamins-Minerals (PRESERVISION AREDS 2) CAPS Take 1 Capful by mouth daily.   rosuvastatin  (CRESTOR ) 5 MG tablet Take 1 tablet (5 mg total) by mouth daily.   sodium chloride  (OCEAN) 0.65 % SOLN nasal spray Place 1 spray into both nostrils as needed for congestion.   valsartan  (DIOVAN ) 40 MG tablet Take 1 tablet (40 mg total) by mouth daily.   [DISCONTINUED] celecoxib  (CELEBREX ) 200 MG capsule TAKE 1 CAPSULE BY MOUTH DAILY   No facility-administered medications prior to visit.    ROS Negative unless otherwise noted in HPI   Objective:     BP (!) 152/72   Pulse 82   Ht 6' (1.829 m)   Wt 209 lb 12 oz (95.1 kg)   SpO2 95%   BMI 28.45 kg/m   Physical Exam Constitutional:      General: He is not in acute distress.    Appearance: Normal appearance.  HENT:     Head: Normocephalic and atraumatic.  Cardiovascular:     Rate and Rhythm: Normal rate and regular rhythm.     Heart sounds: Normal heart sounds. No murmur heard.    No friction rub. No gallop.  Pulmonary:     Effort: Pulmonary effort is normal. No respiratory distress.     Breath sounds: Normal breath sounds. No  wheezing, rhonchi or rales.  Skin:    General: Skin is warm and dry.  Neurological:     Mental Status: He is alert and oriented to person, place, and time.  Psychiatric:        Mood and Affect: Mood normal.   No results found for any visits on 01/15/24.   Assessment & Plan:  Hypertension associated with diabetes (HCC) Assessment & Plan: BP goal <130/80.  Above goal, first recommend changing blood pressure medication to morning dosing.  Continue amlodipine  10 mg and valsartan  40 mg daily.  Recheck blood pressure at nurse visit in 2 weeks.  If still above goal, increase valsartan  dose.  Electrolytes and renal function within normal limits.  Will continue  to monitor.   Hyperlipidemia associated with type 2 diabetes mellitus (HCC) Assessment & Plan: Last lipid panel: LDL 49, HDL 50, triglycerides 83. LDL level is significant improvement from 110 on last check. Continue rosuvastatin  5 mg daily.  Continue to follow heart healthy diet low in fat.  Will continue to monitor.   Type 2 diabetes mellitus with hyperglycemia, without long-term current use of insulin  (HCC) Assessment & Plan: A1c increased to 8.4 from 7.2, above goal <7.5.  Discussed that he needs to take his medication exactly as prescribed.  Resume glipizide  10 mg daily with breakfast.  Continue limiting sweets and fried foods.  Will continue to monitor.  Orders: -     Ambulatory referral to Ophthalmology  Primary osteoarthritis of left knee -     Celecoxib ; Take 1 capsule (200 mg total) by mouth daily.  Dispense: 30 capsule; Refill: 2  Recommend using Tylenol  for musculoskeletal pain.  May take celecoxib  for any breakthrough pain but should use caution given that he is anticoagulated with Eliquis .  Return in about 3 months (around 04/13/2024) for follow-up for HTN, DM, POC A1C at visit.    Joshua DELENA Sear, PA

## 2024-01-15 NOTE — Assessment & Plan Note (Signed)
 Last lipid panel: LDL 49, HDL 50, triglycerides 83. LDL level is significant improvement from 110 on last check. Continue rosuvastatin  5 mg daily.  Continue to follow heart healthy diet low in fat.  Will continue to monitor.

## 2024-01-16 ENCOUNTER — Encounter: Payer: Self-pay | Admitting: Family Medicine

## 2024-01-20 ENCOUNTER — Other Ambulatory Visit: Payer: Self-pay | Admitting: Family Medicine

## 2024-01-20 DIAGNOSIS — E1165 Type 2 diabetes mellitus with hyperglycemia: Secondary | ICD-10-CM

## 2024-01-20 DIAGNOSIS — E1169 Type 2 diabetes mellitus with other specified complication: Secondary | ICD-10-CM

## 2024-02-07 ENCOUNTER — Encounter: Payer: Self-pay | Admitting: Family Medicine

## 2024-02-10 MED ORDER — ALBUTEROL SULFATE HFA 108 (90 BASE) MCG/ACT IN AERS: 2.0000 | INHALATION_SPRAY | RESPIRATORY_TRACT | 0 refills | Status: AC | PRN

## 2024-03-20 ENCOUNTER — Encounter (HOSPITAL_BASED_OUTPATIENT_CLINIC_OR_DEPARTMENT_OTHER): Payer: Self-pay

## 2024-03-23 ENCOUNTER — Other Ambulatory Visit (HOSPITAL_COMMUNITY): Payer: Self-pay

## 2024-03-23 ENCOUNTER — Ambulatory Visit: Payer: HMO | Attending: Physician Assistant | Admitting: Physician Assistant

## 2024-03-23 ENCOUNTER — Encounter: Payer: Self-pay | Admitting: Physician Assistant

## 2024-03-23 VITALS — BP 126/62 | HR 70 | Ht 72.0 in | Wt 205.2 lb

## 2024-03-23 DIAGNOSIS — E1169 Type 2 diabetes mellitus with other specified complication: Secondary | ICD-10-CM | POA: Diagnosis not present

## 2024-03-23 DIAGNOSIS — I4892 Unspecified atrial flutter: Secondary | ICD-10-CM | POA: Diagnosis not present

## 2024-03-23 DIAGNOSIS — E1159 Type 2 diabetes mellitus with other circulatory complications: Secondary | ICD-10-CM

## 2024-03-23 DIAGNOSIS — I152 Hypertension secondary to endocrine disorders: Secondary | ICD-10-CM

## 2024-03-23 DIAGNOSIS — E785 Hyperlipidemia, unspecified: Secondary | ICD-10-CM | POA: Diagnosis not present

## 2024-03-23 MED ORDER — RIVAROXABAN 20 MG PO TABS: 20.0000 mg | ORAL_TABLET | Freq: Every day | ORAL | 3 refills | Status: DC

## 2024-03-23 MED ORDER — RIVAROXABAN 20 MG PO TABS
20.0000 mg | ORAL_TABLET | Freq: Every day | ORAL | 3 refills | Status: DC
  Filled 2024-03-23 (×2): qty 90, 90d supply, fill #0

## 2024-03-23 NOTE — Progress Notes (Unsigned)
 Cardiology Office Note:  .   Date:  03/25/2024  ID:  Joshua [name], DOB 12-Dec-1935, MRN [id number] PCP: [name], PA  [facility] Providers Cardiologist:  Little Ishikawa, MD     History of Present Illness: .   Joshua [name] is a 88 y.o. male with past medical history of COPD on home O2, hypertension, DM2, AAA, chronic DVT not on anticoagulation per family decision and chronic back pain.  Patient was admitted in October 2022 with gangrene cholecystitis.  He underwent colectomy.  Venous Doppler obtained on 09/27/2021 showed chronic DVT involving the left popliteal vein.  The team had a family discussion regarding his DVT and the family opted not to pursue anticoagulation secondary to falls at home.  He was noted to also have a small AAA of 3.0 cm.  Team in the hospital also discussed palliative care with the patient in terms of his care.  He was treated for pneumonia.  There was some concern of possible vegetation on TTE obtained on 09/27/2021, this also revealed a EF of 65 to 70%, grade 1 diastolic dysfunction, mild LVH, mild LAE.  Highly mobile echo translucent mass measuring 1.14 cm x 0.99 cm, unclear if this was attached to the RV wall due to poor visibility.  Subsequent TEE obtained on 09/28/2021 showed EF of 60 to 65%, no regional wall motion abnormality, normal RV, moderate LAE, mild MR, no evidence of vegetation or infective endocarditis.  He was last seen by Dr. Carolan Clines in January 2025, at which time he was in atrial flutter.  He was started on metoprolol succinate 25 mg daily for rate control.  He was also started on Eliquis as his previous fall risk has decreased.  Patient presents today for follow-up.  He has only been taking Eliquis daily instead of twice a day as instructed.  He says when he take the afternoon dose of Eliquis, he sometimes get a little bit dizzy.  He denies any bleeding issue.  Given his compliance issue, I think we need to switch the  medication to Xarelto 20 mg daily.  Our clinical pharmacist to the clinic test on Xarelto, Xarelto cost $47 after deductible.  Will make sure the patient have a Xarelto coupon card as well.  I plan to see the patient back in 4 weeks and he will switch cardiologist to to Dr. Epifanio Lesches and a follow-up in 4 months.  He is currently a cardiologist Dr. Carolan Clines is leaving our practice  ROS:   He denies chest pain, palpitations, dyspnea, pnd, orthopnea, n, v, dizziness, syncope, edema, weight gain, or early satiety. All other systems reviewed and are otherwise negative except as noted above.    Studies Reviewed: Marland Kitchen   EKG Interpretation Date/Time:  Monday March 23 2024 15:48:21 EDT Ventricular Rate:  65 PR Interval:    QRS Duration:  88 QT Interval:  402 QTC Calculation: 418 R Axis:   13  Text Interpretation: Atrial flutter with 4:1 A-V conduction No significant ST-T wave changes Confirmed by Azalee Course [phone]) on 03/25/2024 9:57:13 PM    Cardiac Studies & Procedures   ______________________________________________________________________________________________     ECHOCARDIOGRAM  ECHOCARDIOGRAM COMPLETE 09/27/2021  Narrative ECHOCARDIOGRAM REPORT    Patient Name:   Joshua [name] Date of Exam: 09/27/2021 Medical Rec #:  [id number]       Height:       72.0 in Accession #:    [id number]      Weight:  212.0 lb Date of Birth:  [birth year]       BSA:          2.184 m Patient Age:    85 years        BP:           135/62 mmHg Patient Gender: M               HR:           104 bpm. Exam Location:  Inpatient  Procedure: 2D Echo, Color Doppler and Cardiac Doppler  Indications:    R06.9 DOE  History:        Patient has no prior history of Echocardiogram examinations. COPD; Risk Factors:Hypertension, Diabetes and Dyslipidemia.  Sonographer:    Sherline Distel Senior RDCS Referring Phys: [id number] MARSHALL L CHAMBLISS   Sonographer Comments: Technically difficult due to  COPD IMPRESSIONS   1. Left ventricular ejection fraction, by estimation, is 65 to 70%. The left ventricle has normal function. The left ventricle has no regional wall motion abnormalities. There is mild concentric left ventricular hypertrophy. Left ventricular diastolic parameters are consistent with Grade I diastolic dysfunction (impaired relaxation). 2. On short axis view (frame 16) there is a higly mobile echolucent mass ( 1.14 cm x 0.99 cm) which is concerning for a thrombus in transit vs vegetation. Unclear if this attaches to the RV wall due to poor visibility. Right ventricular systolic function was not well visualized. The right ventricular size is normal. Tricuspid regurgitation signal is inadequate for assessing PA pressure. 3. Left atrial size was mildly dilated. 4. The mitral valve is normal in structure. No evidence of mitral valve regurgitation. No evidence of mitral stenosis. 5. The aortic valve is tricuspid. Aortic valve regurgitation is not visualized. No aortic stenosis is present. 6. The inferior vena cava is normal in size with greater than 50% respiratory variability, suggesting right atrial pressure of 3 mmHg.  Conclusion(s)/Recommendation(s): Findings concerning for clot in transit vs vegetaion, would recommend Transesophageal Echocardiogram for clarification.  FINDINGS Left Ventricle: Left ventricular ejection fraction, by estimation, is 65 to 70%. The left ventricle has normal function. The left ventricle has no regional wall motion abnormalities. The left ventricular internal cavity size was normal in size. There is mild concentric left ventricular hypertrophy. Left ventricular diastolic parameters are consistent with Grade I diastolic dysfunction (impaired relaxation).  Right Ventricle: On short axis view (frame 16) there is a higly mobile echolucent mass ( 1.14 cm x 0.99 cm) which is concerning for a thrombus in transit vs vegetation. Unclear if this attaches to the RV  wall due to poor visibility. The right ventricular size is normal. No increase in right ventricular wall thickness. Right ventricular systolic function was not well visualized. Tricuspid regurgitation signal is inadequate for assessing PA pressure.  Left Atrium: Left atrial size was mildly dilated.  Right Atrium: Right atrial size was not well visualized.  Pericardium: There is no evidence of pericardial effusion.  Mitral Valve: The mitral valve is normal in structure. No evidence of mitral valve regurgitation. No evidence of mitral valve stenosis.  Tricuspid Valve: The tricuspid valve is normal in structure. Tricuspid valve regurgitation is not demonstrated. No evidence of tricuspid stenosis.  Aortic Valve: The aortic valve is tricuspid. Aortic valve regurgitation is not visualized. No aortic stenosis is present.  Pulmonic Valve: The pulmonic valve was normal in structure. Pulmonic valve regurgitation is not visualized. No evidence of pulmonic stenosis.  Aorta: The aortic root is normal in size and structure.  Venous: The inferior vena cava is normal in size with greater than 50% respiratory variability, suggesting right atrial pressure of 3 mmHg.  IAS/Shunts: No atrial level shunt detected by color flow Doppler.   LEFT VENTRICLE PLAX 2D LVIDd:         4.60 cm   Diastology LVIDs:         2.90 cm   LV e' medial:    5.33 cm/s LV PW:         1.00 cm   LV E/e' medial:  9.7 LV IVS:        1.20 cm   LV e' lateral:   8.38 cm/s LVOT diam:     1.90 cm   LV E/e' lateral: 6.2 LV SV:         46 LV SV Index:   21 LVOT Area:     2.84 cm   LEFT ATRIUM           Index LA diam:      3.60 cm 1.65 cm/m LA Vol (A2C): 28.4 ml 13.00 ml/m LA Vol (A4C): 86.4 ml 39.56 ml/m AORTIC VALVE LVOT Vmax:   84.20 cm/s LVOT Vmean:  61.500 cm/s LVOT VTI:    0.163 m  AORTA Ao Root diam: 3.60 cm Ao Asc diam:  3.40 cm  MITRAL VALVE MV Area (PHT): 3.61 cm     SHUNTS MV Decel Time: 210 msec      Systemic VTI:  0.16 m MV E velocity: 51.90 cm/s   Systemic Diam: 1.90 cm MV A velocity: 139.00 cm/s MV E/A ratio:  0.37  Kardie Tobb DO Electronically signed by Thomasene Ripple DO Signature Date/Time: 09/27/2021/2:26:20 PM    Final   TEE  ECHO TEE 09/28/2021  Narrative TRANSESOPHOGEAL ECHO REPORT    Patient Name:   Joshua [name] Date of Exam: 09/28/2021 Medical Rec #:  [id number]       Height:       72.0 in Accession #:    [id number]      Weight:       212.1 lb Date of Birth:  12/01/1936       BSA:          2.184 m Patient Age:    85 years        BP:           136/71 mmHg Patient Gender: M               HR:           93 bpm. Exam Location:  Inpatient  Procedure: Transesophageal Echo, Cardiac Doppler and Color Doppler  Indications:     Endocarditis  History:         Patient has prior history of Echocardiogram examinations, most recent 09/27/2021. Risk Factors:Hypertension, Diabetes and Dyslipidemia.  Sonographer:     Ross Ludwig RDCS (AE) Referring Phys:  [id number] Tessa Lerner Diagnosing Phys: Tessa Lerner DO  PROCEDURE: After discussion of the risks and benefits of a TEE, an informed consent was obtained from the patient. The transesophogeal probe was passed without difficulty through the esophogus of the patient. Sedation performed by different physician. The patient was monitored while under deep sedation. Anesthestetic sedation was provided intravenously by Anesthesiology: 183.54mg  of Propofol, 100mg  of Lidocaine. Image quality was good. The patient developed no complications during the procedure.  IMPRESSIONS   1. Left ventricular ejection fraction, by estimation, is 60 to 65%. The left ventricle has normal function. The left ventricle has  no regional wall motion abnormalities. There is mild left ventricular hypertrophy. Left ventricular diastolic function could not be evaluated. 2. Right ventricular systolic function is normal. The right ventricular size is normal. 3.  Left atrial size was moderately dilated. No left atrial/left atrial appendage thrombus was detected. The LAA emptying velocity was 45 cm/s. 4. Right atrial size was grossly normal. 5. The mitral valve is normal in structure. Mild mitral valve regurgitation. No evidence of mitral stenosis. 6. The aortic valve is tricuspid. Aortic valve regurgitation is not visualized. Mild aortic valve sclerosis is present, with no evidence of aortic valve stenosis.  Conclusion(s)/Recommendation(s): No evidence of vegetation/infective endocarditis on this transesophageal echocardiogram.  FINDINGS Left Ventricle: Left ventricular ejection fraction, by estimation, is 60 to 65%. The left ventricle has normal function. The left ventricle has no regional wall motion abnormalities. The left ventricular internal cavity size was normal in size. There is mild left ventricular hypertrophy. Left ventricular diastolic function could not be evaluated.  Right Ventricle: The right ventricular size is normal. No increase in right ventricular wall thickness. Right ventricular systolic function is normal.  Left Atrium: Left atrial size was moderately dilated. No left atrial/left atrial appendage thrombus was detected. The LAA emptying velocity was 45 cm/s.  Right Atrium: Right atrial size was grossly normal.  Pericardium: There is no evidence of pericardial effusion.  Mitral Valve: The mitral valve is normal in structure. Mild mitral valve regurgitation. No evidence of mitral valve stenosis. There is no evidence of mitral valve vegetation.  Tricuspid Valve: The tricuspid valve is grossly normal. Tricuspid valve regurgitation is not demonstrated. No evidence of tricuspid stenosis. There is no evidence of tricuspid valve vegetation.  Aortic Valve: The aortic valve is tricuspid. Aortic valve regurgitation is not visualized. Mild aortic valve sclerosis is present, with no evidence of aortic valve stenosis. Aortic valve mean  gradient measures 6.0 mmHg. Aortic valve peak gradient measures 13.4 mmHg. There is no evidence of aortic valve vegetation.  Pulmonic Valve: The pulmonic valve was grossly normal. Pulmonic valve regurgitation is not visualized. No evidence of pulmonic stenosis. There is no evidence of pulmonic valve vegetation.  Aorta: The aortic root and ascending aorta are structurally normal, with no evidence of dilitation. There is minimal (Grade I) plaque involving the ascending aorta.  IAS/Shunts: The interatrial septum appears to be lipomatous. No atrial level shunt detected by color flow Doppler.   LEFT VENTRICLE PLAX 2D LVOT diam:     2.30 cm LVOT Area:     4.15 cm   AORTIC VALVE AV Vmax:      183.00 cm/s AV Vmean:     119.000 cm/s AV VTI:       0.296 m AV Peak Grad: 13.4 mmHg AV Mean Grad: 6.0 mmHg  AORTA Ao Root diam: 3.30 cm Ao Asc diam:  3.10 cm   SHUNTS Systemic Diam: 2.30 cm  Sunit Tolia DO Electronically signed by Tessa Lerner DO Signature Date/Time: 09/28/2021/6:24:50 PM    Final        ______________________________________________________________________________________________      Risk Assessment/Calculations:    CHA2DS2-VASc Score = 3   This indicates a 3.2% annual risk of stroke. The patient's score is based upon: CHF History: 0 HTN History: 1 Diabetes History: 0 Stroke History: 0 Vascular Disease History: 0 Age Score: 2 Gender Score: 0           Physical Exam:   VS:  BP 126/62 (BP Location: Left Arm, Patient Position: Sitting, Cuff Size: Normal)  Pulse 70   Ht 6' (1.829 m)   Wt 205 lb 3.2 oz (93.1 kg)   SpO2 96%   BMI 27.83 kg/m    Wt Readings from Last 3 Encounters:  03/23/24 205 lb 3.2 oz (93.1 kg)  01/15/24 209 lb 12 oz (95.1 kg)  12/24/23 208 lb 6.4 oz (94.5 kg)    GEN: Well nourished, well developed in no acute distress NECK: No JVD; No carotid bruits CARDIAC: RRR, no murmurs, rubs, gallops RESPIRATORY:  Clear to auscultation  without rales, wheezing or rhonchi  ABDOMEN: Soft, non-tender, non-distended EXTREMITIES:  No edema; No deformity   ASSESSMENT AND PLAN: .    Atrial flutter: Rate controlled.  Patient is not very good at taking Eliquis.  Instead of twice daily dosing, he has been only taking it daily.  Will switch Eliquis to Xarelto.  I will bring the patient back in 4 weeks at which time we will discuss possibility of cardioversion.  Hypertension: Blood pressure stable  Hyperlipidemia: On rosuvastatin       Dispo: Follow-up with Dr. Carson Clara in 76-month, I will see him back in 4 weeks.  Signed, Ervin Heath, PA

## 2024-03-23 NOTE — Patient Instructions (Signed)
 Medication Instructions:  STOP ELIQUIS   START XARELTO 20 MG DAILY  *If you need a refill on your cardiac medications before your next appointment, please call your pharmacy*  Lab Work: NO LABS If you have labs (blood work) drawn today and your tests are completely normal, you will receive your results only by: MyChart Message (if you have MyChart) OR A paper copy in the mail If you have any lab test that is abnormal or we need to change your treatment, we will call you to review the results.  Testing/Procedures: NO TESTING  Follow-Up: At [facility], you and your health needs are our priority.  As part of our continuing mission to provide you with exceptional heart care, our providers are all part of one team.  This team includes your primary Cardiologist (physician) and Advanced Practice Providers or APPs (Physician Assistants and Nurse Practitioners) who all work together to provide you with the care you need, when you need it.  Your next appointment:   4 week(s)  Provider:   Ervin Heath PA  Then, Wendie Hamburg, MD will plan to see you again in 4 month(s).    Other Instructions   1st Floor: - Lobby - Registration  - Pharmacy  - Lab - Cafe  2nd Floor: - PV Lab - Diagnostic Testing (echo, CT, nuclear med)  3rd Floor: - Vacant  4th Floor: - TCTS (cardiothoracic surgery) - AFib Clinic - Structural Heart Clinic - Vascular Surgery  - Vascular Ultrasound  5th Floor: - [facility] Cardiology (general and EP) - Clinical Pharmacy for coumadin, hypertension, lipid, weight-loss medications, and med management appointments    Valet parking services will be available as well.

## 2024-03-24 ENCOUNTER — Other Ambulatory Visit (HOSPITAL_BASED_OUTPATIENT_CLINIC_OR_DEPARTMENT_OTHER): Payer: Self-pay

## 2024-05-07 ENCOUNTER — Encounter: Payer: Self-pay | Admitting: Physician Assistant

## 2024-05-07 ENCOUNTER — Ambulatory Visit: Attending: Physician Assistant | Admitting: Physician Assistant

## 2024-05-07 VITALS — BP 130/68 | HR 78 | Ht 72.0 in | Wt 206.0 lb

## 2024-05-07 DIAGNOSIS — E119 Type 2 diabetes mellitus without complications: Secondary | ICD-10-CM

## 2024-05-07 DIAGNOSIS — J449 Chronic obstructive pulmonary disease, unspecified: Secondary | ICD-10-CM | POA: Diagnosis not present

## 2024-05-07 DIAGNOSIS — I4892 Unspecified atrial flutter: Secondary | ICD-10-CM

## 2024-05-07 DIAGNOSIS — I10 Essential (primary) hypertension: Secondary | ICD-10-CM | POA: Diagnosis not present

## 2024-05-07 DIAGNOSIS — I8290 Acute embolism and thrombosis of unspecified vein: Secondary | ICD-10-CM

## 2024-05-07 NOTE — Patient Instructions (Signed)
 Medication Instructions:  NO CHANGES *If you need a refill on your cardiac medications before your next appointment, please call your pharmacy*  Lab Work: NO LABS If you have labs (blood work) drawn today and your tests are completely normal, you will receive your results only by: MyChart Message (if you have MyChart) OR A paper copy in the mail If you have any lab test that is abnormal or we need to change your treatment, we will call you to review the results.  Testing/Procedures:[address] Your physician has requested that you have an echocardiogram. Echocardiography is a painless test that uses sound waves to create images of your heart. It provides your doctor with information about the size and shape of your heart and how well your heart's chambers and valves are working. This procedure takes approximately one hour. There are no restrictions for this procedure. Please do NOT wear cologne, perfume, aftershave, or lotions (deodorant is allowed). Please arrive 15 minutes prior to your appointment time.  Please note: We ask at that you not bring children with you during ultrasound (echo/ vascular) testing. Due to room size and safety concerns, children are not allowed in the ultrasound rooms during exams. Our front office staff cannot provide observation of children in our lobby area while testing is being conducted. An adult accompanying a patient to their appointment will only be allowed in the ultrasound room at the discretion of the ultrasound technician under special circumstances. We apologize for any inconvenience.   Follow-Up: At [facility], you and your health needs are our priority.  As part of our continuing mission to provide you with exceptional heart care, our providers are all part of one team.  This team includes your primary Cardiologist (physician) and Advanced Practice Providers or APPs (Physician Assistants and Nurse Practitioners) who all work together to  provide you with the care you need, when you need it.  Your next appointment:   KEEP FOLLOW UP AUGUST 2025  Provider:   Wendie Hamburg, MD

## 2024-05-07 NOTE — Progress Notes (Signed)
 Cardiology Office Note:  .   Date:  05/07/2024  ID:  Joshua [name], DOB 1936-09-20, MRN [id number] PCP: Noreene Bearded, PA  [facility] Providers Cardiologist:  Wendie Hamburg, MD     History of Present Illness: .   Joshua [name] is a 88 y.o. male with past medical history of COPD on home O2, hypertension, DM2, AAA, chronic DVT, atrial flutter on Xarelto  and chronic back pain.  Patient was admitted in October 2022 with gangrene cholecystitis.  He underwent colectomy.  Venous Doppler obtained on 09/27/2021 showed chronic DVT involving the left popliteal vein.  The team had a family discussion regarding his DVT and the family opted not to pursue anticoagulation secondary to falls at home.  He was noted to also have a small AAA of 3.0 cm.  Team in the hospital also discussed palliative care with the patient in terms of his care.  He was treated for pneumonia.  There was some concern of possible vegetation on TTE obtained on 09/27/2021, this also revealed a EF of 65 to 70%, grade 1 diastolic dysfunction, mild LVH, mild LAE.  Highly mobile echo translucent mass measuring 1.14 cm x 0.99 cm, unclear if this was attached to the RV wall due to poor visibility.  Subsequent TEE obtained on 09/28/2021 showed EF of 60 to 65%, no regional wall motion abnormality, normal RV, moderate LAE, mild MR, no evidence of vegetation or infective endocarditis.   He was seen by Dr. Alois Arnt in January 2025 at which time he was in atrial flutter.  He was started on metoprolol  succinate 25 mg daily for rate control.  He was also started on Eliquis 's previous fall risk has decreased.  I saw the patient in the clinic on 03/23/2024 at which time he was only taking Eliquis  daily instead of twice a day.  Given his compliance issue, I switched him to Xarelto  20 mg daily.  I set him up with Dr. Carson Clara as his primary cardiologist since Dr. Alois Arnt was leaving our practice.  Patient presents today  accompanied by family member.  He is completely asymptomatic while in atrial flutter.  Heart rate is under very good control.  We discussed various options including continue with rate control strategy versus proceeding with cardioversion.  Since patient is completely asymptomatic while he atrial flutter, he opted to continue with rate control at this time.  I will obtain an echocardiogram to make sure his ejection fraction is normal.  He will be set up with Dr. Alda Amas in August.  ROS:   He denies chest pain, palpitations, dyspnea, pnd, orthopnea, n, v, dizziness, syncope, edema, weight gain, or early satiety. All other systems reviewed and are otherwise negative except as noted above.    Studies Reviewed: Aaron Aas   EKG Interpretation Date/Time:  Thursday May 07 2024 11:17:00 EDT Ventricular Rate:  64 PR Interval:    QRS Duration:  72 QT Interval:  402 QTC Calculation: 414 R Axis:   -15  Text Interpretation: Atrial flutter with variable A-V block Low voltage QRS When compared with ECG of 23-Mar-2024 15:48, No significant change was found Confirmed by Ervin Heath [phone]) on 05/07/2024 1:53:07 PM    Cardiac Studies & Procedures   ______________________________________________________________________________________________     ECHOCARDIOGRAM  ECHOCARDIOGRAM COMPLETE 09/27/2021  Narrative ECHOCARDIOGRAM REPORT    Patient Name:   Joshua [name] Date of Exam: 09/27/2021 Medical Rec #:  [id number]       Height:  72.0 in Accession #:    [id number]      Weight:       212.0 lb Date of Birth:  May 08, 1936       BSA:          2.184 m Patient Age:    85 years        BP:           135/62 mmHg Patient Gender: M               HR:           104 bpm. Exam Location:  Inpatient  Procedure: 2D Echo, Color Doppler and Cardiac Doppler  Indications:    R06.9 DOE  History:        Patient has no prior history of Echocardiogram examinations. COPD; Risk Factors:Hypertension, Diabetes and  Dyslipidemia.  Sonographer:    Sherline Distel Senior RDCS Referring Phys: [id number] MARSHALL L CHAMBLISS   Sonographer Comments: Technically difficult due to COPD IMPRESSIONS   1. Left ventricular ejection fraction, by estimation, is 65 to 70%. The left ventricle has normal function. The left ventricle has no regional wall motion abnormalities. There is mild concentric left ventricular hypertrophy. Left ventricular diastolic parameters are consistent with Grade I diastolic dysfunction (impaired relaxation). 2. On short axis view (frame 16) there is a higly mobile echolucent mass ( 1.14 cm x 0.99 cm) which is concerning for a thrombus in transit vs vegetation. Unclear if this attaches to the RV wall due to poor visibility. Right ventricular systolic function was not well visualized. The right ventricular size is normal. Tricuspid regurgitation signal is inadequate for assessing PA pressure. 3. Left atrial size was mildly dilated. 4. The mitral valve is normal in structure. No evidence of mitral valve regurgitation. No evidence of mitral stenosis. 5. The aortic valve is tricuspid. Aortic valve regurgitation is not visualized. No aortic stenosis is present. 6. The inferior vena cava is normal in size with greater than 50% respiratory variability, suggesting right atrial pressure of 3 mmHg.  Conclusion(s)/Recommendation(s): Findings concerning for clot in transit vs vegetaion, would recommend Transesophageal Echocardiogram for clarification.  FINDINGS Left Ventricle: Left ventricular ejection fraction, by estimation, is 65 to 70%. The left ventricle has normal function. The left ventricle has no regional wall motion abnormalities. The left ventricular internal cavity size was normal in size. There is mild concentric left ventricular hypertrophy. Left ventricular diastolic parameters are consistent with Grade I diastolic dysfunction (impaired relaxation).  Right Ventricle: On short axis view (frame 16) there  is a higly mobile echolucent mass ( 1.14 cm x 0.99 cm) which is concerning for a thrombus in transit vs vegetation. Unclear if this attaches to the RV wall due to poor visibility. The right ventricular size is normal. No increase in right ventricular wall thickness. Right ventricular systolic function was not well visualized. Tricuspid regurgitation signal is inadequate for assessing PA pressure.  Left Atrium: Left atrial size was mildly dilated.  Right Atrium: Right atrial size was not well visualized.  Pericardium: There is no evidence of pericardial effusion.  Mitral Valve: The mitral valve is normal in structure. No evidence of mitral valve regurgitation. No evidence of mitral valve stenosis.  Tricuspid Valve: The tricuspid valve is normal in structure. Tricuspid valve regurgitation is not demonstrated. No evidence of tricuspid stenosis.  Aortic Valve: The aortic valve is tricuspid. Aortic valve regurgitation is not visualized. No aortic stenosis is present.  Pulmonic Valve: The pulmonic valve was normal in structure. Pulmonic valve  regurgitation is not visualized. No evidence of pulmonic stenosis.  Aorta: The aortic root is normal in size and structure.  Venous: The inferior vena cava is normal in size with greater than 50% respiratory variability, suggesting right atrial pressure of 3 mmHg.  IAS/Shunts: No atrial level shunt detected by color flow Doppler.   LEFT VENTRICLE PLAX 2D LVIDd:         4.60 cm   Diastology LVIDs:         2.90 cm   LV e' medial:    5.33 cm/s LV PW:         1.00 cm   LV E/e' medial:  9.7 LV IVS:        1.20 cm   LV e' lateral:   8.38 cm/s LVOT diam:     1.90 cm   LV E/e' lateral: 6.2 LV SV:         46 LV SV Index:   21 LVOT Area:     2.84 cm   LEFT ATRIUM           Index LA diam:      3.60 cm 1.65 cm/m LA Vol (A2C): 28.4 ml 13.00 ml/m LA Vol (A4C): 86.4 ml 39.56 ml/m AORTIC VALVE LVOT Vmax:   84.20 cm/s LVOT Vmean:  61.500 cm/s LVOT VTI:     0.163 m  AORTA Ao Root diam: 3.60 cm Ao Asc diam:  3.40 cm  MITRAL VALVE MV Area (PHT): 3.61 cm     SHUNTS MV Decel Time: 210 msec     Systemic VTI:  0.16 m MV E velocity: 51.90 cm/s   Systemic Diam: 1.90 cm MV A velocity: 139.00 cm/s MV E/A ratio:  0.37  Kardie Tobb DO Electronically signed by Jerryl Morin DO Signature Date/Time: 09/27/2021/2:26:20 PM    Final   TEE  ECHO TEE 09/28/2021  Narrative TRANSESOPHOGEAL ECHO REPORT    Patient Name:   ABDOULAYE DRUM Date of Exam: 09/28/2021 Medical Rec #:  [id number]       Height:       72.0 in Accession #:    [id number]      Weight:       212.1 lb Date of Birth:  23-Oct-1936       BSA:          2.184 m Patient Age:    85 years        BP:           136/71 mmHg Patient Gender: M               HR:           93 bpm. Exam Location:  Inpatient  Procedure: Transesophageal Echo, Cardiac Doppler and Color Doppler  Indications:     Endocarditis  History:         Patient has prior history of Echocardiogram examinations, most recent 09/27/2021. Risk Factors:Hypertension, Diabetes and Dyslipidemia.  Sonographer:     Crissie Dome RDCS (AE) Referring Phys:  [id number] Olinda Bertrand Diagnosing Phys: Olinda Bertrand DO  PROCEDURE: After discussion of the risks and benefits of a TEE, an informed consent was obtained from the patient. The transesophogeal probe was passed without difficulty through the esophogus of the patient. Sedation performed by different physician. The patient was monitored while under deep sedation. Anesthestetic sedation was provided intravenously by Anesthesiology: 183.54mg  of Propofol , 100mg  of Lidocaine . Image quality was good. The patient developed no complications during the procedure.  IMPRESSIONS  1. Left ventricular ejection fraction, by estimation, is 60 to 65%. The left ventricle has normal function. The left ventricle has no regional wall motion abnormalities. There is mild left ventricular hypertrophy. Left  ventricular diastolic function could not be evaluated. 2. Right ventricular systolic function is normal. The right ventricular size is normal. 3. Left atrial size was moderately dilated. No left atrial/left atrial appendage thrombus was detected. The LAA emptying velocity was 45 cm/s. 4. Right atrial size was grossly normal. 5. The mitral valve is normal in structure. Mild mitral valve regurgitation. No evidence of mitral stenosis. 6. The aortic valve is tricuspid. Aortic valve regurgitation is not visualized. Mild aortic valve sclerosis is present, with no evidence of aortic valve stenosis.  Conclusion(s)/Recommendation(s): No evidence of vegetation/infective endocarditis on this transesophageal echocardiogram.  FINDINGS Left Ventricle: Left ventricular ejection fraction, by estimation, is 60 to 65%. The left ventricle has normal function. The left ventricle has no regional wall motion abnormalities. The left ventricular internal cavity size was normal in size. There is mild left ventricular hypertrophy. Left ventricular diastolic function could not be evaluated.  Right Ventricle: The right ventricular size is normal. No increase in right ventricular wall thickness. Right ventricular systolic function is normal.  Left Atrium: Left atrial size was moderately dilated. No left atrial/left atrial appendage thrombus was detected. The LAA emptying velocity was 45 cm/s.  Right Atrium: Right atrial size was grossly normal.  Pericardium: There is no evidence of pericardial effusion.  Mitral Valve: The mitral valve is normal in structure. Mild mitral valve regurgitation. No evidence of mitral valve stenosis. There is no evidence of mitral valve vegetation.  Tricuspid Valve: The tricuspid valve is grossly normal. Tricuspid valve regurgitation is not demonstrated. No evidence of tricuspid stenosis. There is no evidence of tricuspid valve vegetation.  Aortic Valve: The aortic valve is tricuspid.  Aortic valve regurgitation is not visualized. Mild aortic valve sclerosis is present, with no evidence of aortic valve stenosis. Aortic valve mean gradient measures 6.0 mmHg. Aortic valve peak gradient measures 13.4 mmHg. There is no evidence of aortic valve vegetation.  Pulmonic Valve: The pulmonic valve was grossly normal. Pulmonic valve regurgitation is not visualized. No evidence of pulmonic stenosis. There is no evidence of pulmonic valve vegetation.  Aorta: The aortic root and ascending aorta are structurally normal, with no evidence of dilitation. There is minimal (Grade I) plaque involving the ascending aorta.  IAS/Shunts: The interatrial septum appears to be lipomatous. No atrial level shunt detected by color flow Doppler.   LEFT VENTRICLE PLAX 2D LVOT diam:     2.30 cm LVOT Area:     4.15 cm   AORTIC VALVE AV Vmax:      183.00 cm/s AV Vmean:     119.000 cm/s AV VTI:       0.296 m AV Peak Grad: 13.4 mmHg AV Mean Grad: 6.0 mmHg  AORTA Ao Root diam: 3.30 cm Ao Asc diam:  3.10 cm   SHUNTS Systemic Diam: 2.30 cm  Sunit Tolia DO Electronically signed by Olinda Bertrand DO Signature Date/Time: 09/28/2021/6:24:50 PM    Final        ______________________________________________________________________________________________      Risk Assessment/Calculations:    CHA2DS2-VASc Score = 3   This indicates a 3.2% annual risk of stroke. The patient's score is based upon: CHF History: 0 HTN History: 1 Diabetes History: 0 Stroke History: 0 Vascular Disease History: 0 Age Score: 2 Gender Score: 0  Physical Exam:   VS:  BP 130/68 (BP Location: Right Arm, Patient Position: Sitting, Cuff Size: Normal)   Pulse 78   Ht 6' (1.829 m)   Wt 206 lb (93.4 kg)   SpO2 94%   BMI 27.94 kg/m    Wt Readings from Last 3 Encounters:  05/07/24 206 lb (93.4 kg)  03/23/24 205 lb 3.2 oz (93.1 kg)  01/15/24 209 lb 12 oz (95.1 kg)    GEN: Well nourished, well  developed in no acute distress NECK: No JVD; No carotid bruits CARDIAC: Irregularly irregular, no murmurs, rubs, gallops RESPIRATORY: Right lung rhonchi, left lung clear. ABDOMEN: Soft, non-tender, non-distended EXTREMITIES:  No edema; No deformity   ASSESSMENT AND PLAN: .    Persistent atrial flutter: Unknown duration.  First diagnosed in cardiology clinic in January 2025.  Last EKG prior to that was 2022 which showed sinus rhythm.  Heart rate very well-controlled on beta-blocker.  Patient was initially started on Eliquis , however had problem taking blood thinner twice a day, therefore later switched to Xarelto .  He has been compliant with Xarelto  since.  I had discussed with the patient possibility of continuing with rate control therapy versus cardioversion.  Given the asymptomatic nature, he opted to stay in atrial flutter and to continue with rate control management.  He is aware that the longer he stays in atrial flutter, the less likely he will come out of atrial flutter in the future.  COPD: Has oxygen at home, however not on oxygen today.  No acute exacerbation  Hypertension: Blood pressure stable  DM2: Managed by primary care provider.  History of DVT: Initially family did not to start on blood thinner, however due to decreased fall risk, he was eventually started on blood thinner in January 2025 due to new diagnosis of atrial flutter.        Dispo: Establish with Dr. Carson Clara in Aug  Signed, Jama Mcmiller, [place]

## 2024-06-24 ENCOUNTER — Ambulatory Visit (HOSPITAL_COMMUNITY)
Admission: RE | Admit: 2024-06-24 | Discharge: 2024-06-24 | Disposition: A | Discharge status: Home / Self Care | Source: Ambulatory Visit | Attending: Cardiovascular Disease | Admitting: Cardiovascular Disease

## 2024-06-24 DIAGNOSIS — I4892 Unspecified atrial flutter: Secondary | ICD-10-CM | POA: Insufficient documentation

## 2024-06-24 LAB — ECHOCARDIOGRAM COMPLETE
Area-P 1/2: 5.21 cm2
S' Lateral: 2.6 cm

## 2024-06-25 ENCOUNTER — Other Ambulatory Visit: Payer: Self-pay | Admitting: Family Medicine

## 2024-06-25 DIAGNOSIS — I10 Essential (primary) hypertension: Secondary | ICD-10-CM

## 2024-06-26 ENCOUNTER — Ambulatory Visit: Payer: Self-pay | Admitting: Physician Assistant

## 2024-06-29 ENCOUNTER — Other Ambulatory Visit: Payer: Self-pay

## 2024-06-29 DIAGNOSIS — I10 Essential (primary) hypertension: Secondary | ICD-10-CM

## 2024-06-29 MED ORDER — VALSARTAN 40 MG PO TABS: 40.0000 mg | ORAL_TABLET | Freq: Every day | ORAL | 2 refills | Status: DC

## 2024-06-29 NOTE — Telephone Encounter (Signed)
 Copied from CRM [id number]. Topic: Clinical - Medical Advice >> Jun 29, 2024 12:15 PM Avram MATSU wrote: Reason for CRM: Daughter is calling because her dad is not taking his medication for his heart. Daughter is nervous he will experience symptoms and would like a callback [phone]

## 2024-07-01 NOTE — Progress Notes (Signed)
 Spoke with patient's daughter on the phone. She informed me that the patient has not been taking his rivaroxaban  prescribed by cardiology for several weeks as the patient believed it was causing body aches. Advised her that this medication is not likely to cause body aches, but rosuvastatin , which also starts with an R, is much more likely to do this. Advised her to discontinue the rosuvastatin  for now and to restart rivaroxaban , but to make cardiology aware of this medication change at their upcoming appointment on 07/16/24. She verbalized understanding and was in agreement with the plan.

## 2024-07-16 ENCOUNTER — Ambulatory Visit: Admitting: Cardiology

## 2024-07-30 ENCOUNTER — Ambulatory Visit (INDEPENDENT_AMBULATORY_CARE_PROVIDER_SITE_OTHER)

## 2024-07-30 VITALS — BP 120/68 | HR 75 | Temp 97.4°F | Ht 72.0 in | Wt 196.0 lb

## 2024-07-30 DIAGNOSIS — Z7984 Long term (current) use of oral hypoglycemic drugs: Secondary | ICD-10-CM

## 2024-07-30 DIAGNOSIS — E1165 Type 2 diabetes mellitus with hyperglycemia: Secondary | ICD-10-CM

## 2024-07-30 DIAGNOSIS — M545 Low back pain, unspecified: Secondary | ICD-10-CM | POA: Diagnosis not present

## 2024-07-30 DIAGNOSIS — H02009 Unspecified entropion of unspecified eye, unspecified eyelid: Secondary | ICD-10-CM

## 2024-07-30 DIAGNOSIS — E785 Hyperlipidemia, unspecified: Secondary | ICD-10-CM

## 2024-07-30 DIAGNOSIS — I10 Essential (primary) hypertension: Secondary | ICD-10-CM | POA: Diagnosis not present

## 2024-07-30 DIAGNOSIS — G8929 Other chronic pain: Secondary | ICD-10-CM

## 2024-07-30 DIAGNOSIS — J449 Chronic obstructive pulmonary disease, unspecified: Secondary | ICD-10-CM

## 2024-07-30 DIAGNOSIS — E1169 Type 2 diabetes mellitus with other specified complication: Secondary | ICD-10-CM

## 2024-07-30 DIAGNOSIS — E1159 Type 2 diabetes mellitus with other circulatory complications: Secondary | ICD-10-CM | POA: Diagnosis not present

## 2024-07-30 DIAGNOSIS — I152 Hypertension secondary to endocrine disorders: Secondary | ICD-10-CM

## 2024-07-30 MED ORDER — VALSARTAN 40 MG PO TABS: 40.0000 mg | ORAL_TABLET | Freq: Every day | ORAL | 2 refills | Status: DC

## 2024-07-30 MED ORDER — AMLODIPINE BESYLATE 10 MG PO TABS: 10.0000 mg | ORAL_TABLET | Freq: Every day | ORAL | 1 refills | Status: AC

## 2024-07-30 MED ORDER — GABAPENTIN 100 MG PO CAPS: 100.0000 mg | ORAL_CAPSULE | Freq: Three times a day (TID) | ORAL | 0 refills | Status: DC

## 2024-07-30 MED ORDER — BREZTRI AEROSPHERE 160-9-4.8 MCG/ACT IN AERO: 2.0000 | INHALATION_SPRAY | Freq: Two times a day (BID) | RESPIRATORY_TRACT | 2 refills | Status: DC

## 2024-07-30 MED ORDER — CARBOXYMETHYLCELLULOSE SODIUM 0.5 % OP SOLN: 1.0000 [drp] | Freq: Three times a day (TID) | OPHTHALMIC | 2 refills | Status: AC | PRN

## 2024-07-30 NOTE — Assessment & Plan Note (Signed)
 BP Goal < 130/80. At goal and stable. Continue Amlodipine  10 mg and Valsartan  40 mg daily. Rechecking CMP today

## 2024-07-30 NOTE — Assessment & Plan Note (Signed)
 Shortness of breath with movement. Albuterol  inhaler caused tachycardia, leading to non-compliance. - Prescribed Breztri  inhaler. Less likely to cause tachycardia. - Consider Trelegy I if Breztri  s cost-prohibitive. - Instructed to use new inhaler to improve breathing.

## 2024-07-30 NOTE — Patient Instructions (Signed)
 VISIT SUMMARY: Today, you came in for a follow-up visit to discuss your hypertension, COPD, nocturnal cough, peripheral neuropathy, gout, and ocular symptoms. You were accompanied by your daughter Roselind and wife Levorn.  YOUR PLAN: -CHRONIC OBSTRUCTIVE PULMONARY DISEASE (COPD): COPD is a chronic lung condition that makes it hard to breathe. You were prescribed a new inhaler called Trelegy, which is less likely to cause palpitations. If Trelegy is too expensive, we can consider Breztri . Please start using the new inhaler to help improve your breathing.  -ESSENTIAL HYPERTENSION: Hypertension is high blood pressure. Your blood pressure is well-controlled with your current medications, valsartan  40 mg daily and amlodipine . Please continue taking these medications as prescribed.  -TYPE 2 DIABETES MELLITUS: Type 2 diabetes is a condition where your blood sugar levels are too high. Your A1c has improved to 7.3 from 8.5. Please continue with your current diabetes management plan.  -ENTROPION OF EYELID: Entropion is a condition where your eyelid turns inward, causing irritation and tearing. You were prescribed eye drops to reduce moisture and irritation and referred to an eye specialist for further evaluation.   INSTRUCTIONS: Please follow up with the eye specialist for your entropion and macular degeneration. Continue using your new Trelegy inhaler as prescribed. If you experience any issues or have questions about your medications, please contact our office.  If you have any problems before your next visit feel free to message me via MyChart (minor issues or questions) or call the office, otherwise you may reach out to schedule an office visit.  Thank you! Saddie Sacks, PA-C

## 2024-07-30 NOTE — Assessment & Plan Note (Signed)
 A1c goal < 7.5. A1c improved from 8.4 to 7.3 Discussed that he needs to take his medication exactly as prescribed. Resume glipizide  10 mg daily with breakfast. Continue limiting sweets and fried foods. Will continue to monitor.

## 2024-07-30 NOTE — Progress Notes (Signed)
 Established Patient Office Visit  Subjective   Patient ID: Joshua [name], male    DOB: 05/11/1936  Age: 88 y.o. MRN: [id number]  Chief Complaint  Patient presents with   Medical Management of Chronic Issues    HPI  History of Present Illness   Joshua [name] is an 88 year old male with hypertension and COPD who presents for a follow-up visit. He is accompanied by his daughter Joshua [name] and wife Joshua [name].  Hypertension - Takes valsartan  40 mg and amlodipine  5 mg for blood pressure control - Does not monitor blood pressure at home  Diabetes Mellitus -Patient currently takes Glipizide  10 mg XL  for management of their diabetes. Reports that they are taking this medication as prescribed. They don't check BG at home . Denies side effects. Denies episodes of hypoglycemia. Denies new sores/wounds that are not healing.   Hyperlipidemia - Patient previously taking rosuvastatin  5 mg for HLD but reports that he stopped this medication after 3-5 days due to leg cramps and has not taken anything since.   Chronic obstructive pulmonary disease (copd) and respiratory symptoms - History of COPD - Prescribed an inhaler but does not use it due to palpitations (albuterol ) - Experiences shortness of breath with minimal exertion, such as walking from the bathroom to the bedroom  Ocular symptoms - History of macular degeneration (?) - Frequent watering of eyes - No recent visit to eye doctor - Uses over-the-counter eye drops - Family observes red eyes and frequent eye rubbing        ROS Per HPI.    Objective:     BP 120/68   Pulse 75   Temp (!) 97.4 F (36.3 C) (Oral)   Ht 6' (1.829 m)   Wt 196 lb 0.6 oz (88.9 kg)   SpO2 96%   BMI 26.59 kg/m    Physical Exam Constitutional:      General: He is not in acute distress.    Appearance: Normal appearance.  Eyes:     Comments: Entropion noted to both lower eye lids   Cardiovascular:     Rate and Rhythm: Normal rate and regular rhythm.      Heart sounds: Normal heart sounds. No murmur heard.    No friction rub. No gallop.  Pulmonary:     Effort: Pulmonary effort is normal. No respiratory distress.     Breath sounds: Normal breath sounds.  Musculoskeletal:        General: No swelling.  Skin:    General: Skin is warm and dry.  Neurological:     General: No focal deficit present.     Mental Status: He is alert.  Psychiatric:        Mood and Affect: Mood normal.        Behavior: Behavior normal.        Thought Content: Thought content normal.      No results found for any visits on 07/30/24.  Last lipids Lab Results  Component Value Date   CHOL 115 01/08/2024   HDL 50 01/08/2024   LDLCALC 49 01/08/2024   TRIG 83 01/08/2024   CHOLHDL 2.3 01/08/2024   Last hemoglobin A1c Lab Results  Component Value Date   HGBA1C 8.4 (H) 01/08/2024      The ASCVD Risk score (Arnett DK, et al., 2019) failed to calculate for the following reasons:   The 2019 ASCVD risk score is only valid for ages 31 to 31    Assessment & Plan:   Type  2 diabetes mellitus with hyperglycemia, without long-term current use of insulin  (HCC) Assessment & Plan: A1c goal < 7.5. A1c improved from 8.4 to 7.3 Discussed that he needs to take his medication exactly as prescribed. Resume glipizide  10 mg daily with breakfast. Continue limiting sweets and fried foods. Will continue to monitor.   Orders: -     POCT glycosylated hemoglobin (Hb A1C)  Chronic low back pain, unspecified back pain laterality, unspecified whether sciatica present -     Gabapentin ; Take 1 capsule (100 mg total) by mouth 3 (three) times daily.  Dispense: 270 capsule; Refill: 0  Essential hypertension -     Valsartan ; Take 1 tablet (40 mg total) by mouth daily.  Dispense: 30 tablet; Refill: 2 -     amLODIPine  Besylate; Take 1 tablet (10 mg total) by mouth daily.  Dispense: 90 tablet; Refill: 1 -     TSH -     CBC with Differential/Platelet -     Comprehensive metabolic  panel with GFR -     Lipid panel  Entropion, unspecified laterality -     Ambulatory referral to Ophthalmology  Hyperlipidemia associated with type 2 diabetes mellitus (HCC) Assessment & Plan: Rechecking lipid panel today. He has stopped rosuvastatin  due to lower leg cramps. Continue to follow heart healthy diet low in fat. Will discuss risk/benefit profile of statins at his age at his next follow up appt.    Hypertension associated with diabetes (HCC) Assessment & Plan: BP Goal < 130/80. At goal and stable. Continue Amlodipine  10 mg and Valsartan  40 mg daily. Rechecking CMP today   Chronic obstructive pulmonary disease, unspecified COPD type (HCC) Assessment & Plan: Shortness of breath with movement. Albuterol  inhaler caused tachycardia, leading to non-compliance. - Prescribed Breztri  inhaler. Less likely to cause tachycardia. - Consider Trelegy I if Breztri  s cost-prohibitive. - Instructed to use new inhaler to improve breathing.   Other orders -     Carboxymethylcellulose Sodium; Place 1 drop into both eyes 3 (three) times daily as needed.  Dispense: 15 mL; Refill: 2 -     Breztri  Aerosphere; Inhale 2 puffs into the lungs in the morning and at bedtime.  Dispense: 2.9 g; Refill: 2    Return in about 4 months (around 11/29/2024) for HTN, HLD, DM.    Saddie JULIANNA Sacks, PA-C

## 2024-07-30 NOTE — Assessment & Plan Note (Signed)
 Rechecking lipid panel today. He has stopped rosuvastatin  due to lower leg cramps. Continue to follow heart healthy diet low in fat. Will discuss risk/benefit profile of statins at his age at his next follow up appt.

## 2024-07-31 LAB — CBC WITH DIFFERENTIAL/PLATELET
Basophils Absolute: 0 x10E3/uL (ref 0.0–0.2)
Basos: 0 %
EOS (ABSOLUTE): 0.4 x10E3/uL (ref 0.0–0.4)
Eos: 5 %
Hematocrit: 44.5 % (ref 37.5–51.0)
Hemoglobin: 13.6 g/dL (ref 13.0–17.7)
Immature Grans (Abs): 0 x10E3/uL (ref 0.0–0.1)
Immature Granulocytes: 0 %
Lymphocytes Absolute: 1.1 x10E3/uL (ref 0.7–3.1)
Lymphs: 14 %
MCH: 30 pg (ref 26.6–33.0)
MCHC: 30.6 g/dL — ABNORMAL LOW (ref 31.5–35.7)
MCV: 98 fL — ABNORMAL HIGH (ref 79–97)
Monocytes Absolute: 0.5 x10E3/uL (ref 0.1–0.9)
Monocytes: 7 %
Neutrophils Absolute: 5.9 x10E3/uL (ref 1.4–7.0)
Neutrophils: 74 %
RBC: 4.54 x10E6/uL (ref 4.14–5.80)
RDW: 13 % (ref 11.6–15.4)
WBC: 7.9 x10E3/uL (ref 3.4–10.8)

## 2024-07-31 LAB — TSH: TSH: 1.25 u[IU]/mL (ref 0.450–4.500)

## 2024-07-31 LAB — COMPREHENSIVE METABOLIC PANEL WITH GFR
ALT: 11 IU/L (ref 0–44)
AST: 12 IU/L (ref 0–40)
Albumin: 3.9 g/dL (ref 3.7–4.7)
Alkaline Phosphatase: 97 IU/L (ref 44–121)
BUN/Creatinine Ratio: 17 (ref 10–24)
BUN: 22 mg/dL (ref 8–27)
Bilirubin Total: 0.4 mg/dL (ref 0.0–1.2)
CO2: 20 mmol/L (ref 20–29)
Calcium: 9.2 mg/dL (ref 8.6–10.2)
Chloride: 105 mmol/L (ref 96–106)
Creatinine, Ser: 1.29 mg/dL — ABNORMAL HIGH (ref 0.76–1.27)
Globulin, Total: 2.7 g/dL (ref 1.5–4.5)
Glucose: 225 mg/dL — ABNORMAL HIGH (ref 70–99)
Potassium: 5.2 mmol/L (ref 3.5–5.2)
Sodium: 141 mmol/L (ref 134–144)
Total Protein: 6.6 g/dL (ref 6.0–8.5)
eGFR: 54 mL/min/1.73 — ABNORMAL LOW (ref >59)

## 2024-07-31 LAB — POCT GLYCOSYLATED HEMOGLOBIN (HGB A1C): Hemoglobin A1C: 7.3 % — AB (ref 4.0–5.6)

## 2024-07-31 LAB — LIPID PANEL
Chol/HDL Ratio: 3.8 ratio (ref 0.0–5.0)
Cholesterol, Total: 160 mg/dL (ref 100–199)
HDL: 42 mg/dL (ref >39)
LDL Chol Calc (NIH): 96 mg/dL (ref 0–99)
Triglycerides: 124 mg/dL (ref 0–149)
VLDL Cholesterol Cal: 22 mg/dL (ref 5–40)

## 2024-07-31 NOTE — Addendum Note (Signed)
 Addended byBETHA GAYLE NUMBERS on: 07/31/2024 08:13 AM   Modules accepted: Orders

## 2024-08-04 ENCOUNTER — Ambulatory Visit: Payer: Self-pay

## 2024-08-04 ENCOUNTER — Telehealth: Payer: Self-pay | Admitting: *Deleted

## 2024-08-04 NOTE — Telephone Encounter (Signed)
 Copied from CRM #[id number]. Topic: Referral - Question >> Aug 03, 2024 11:29 AM Tiffini S wrote: Reason for CRM: John with [facility] [phone] called about a referral- referral should be sent to a general ophthalmologist/ they only do the back of the eye.

## 2024-08-13 NOTE — Telephone Encounter (Signed)
 Copied from CRM [id number]. Topic: General - Other >> Aug 13, 2024 11:56 AM Jasmin G wrote: Reason for CRM: Ms. Joshua [name] from [facility] called regarding recent referral made to clinic, she states that the procedure asked is not done at the office.

## 2024-08-19 ENCOUNTER — Telehealth: Payer: Self-pay

## 2024-08-19 DIAGNOSIS — E1165 Type 2 diabetes mellitus with hyperglycemia: Secondary | ICD-10-CM

## 2024-08-19 MED ORDER — GLIPIZIDE ER 10 MG PO TB24: ORAL_TABLET | ORAL | 3 refills | Status: AC

## 2024-08-19 NOTE — Progress Notes (Unsigned)
   08/19/2024  Patient ID: Joshua [name], male   DOB: 08-Dec-1936, 88 y.o.   MRN: [id number]  Pharmacy Quality Measure Review  This patient is appearing on a report for being at risk of failing the adherence measure for diabetes medications this calendar year.   Medication: glipizide  ER 10mg  Last fill date: 03/23/2024 for 90 day supply  Spoke with daughter, patient confirms to be taking (dtr talked with him today), agreed on sending new rx in new PCPs name to the drug store. Will route request to [place].   Lang Sieve, PharmD, BCGP Clinical Pharmacist  [phone]

## 2024-11-02 ENCOUNTER — Other Ambulatory Visit: Payer: Self-pay

## 2024-11-02 DIAGNOSIS — I10 Essential (primary) hypertension: Secondary | ICD-10-CM

## 2024-11-02 NOTE — Telephone Encounter (Unsigned)
 Copied from CRM [id number]. Topic: Clinical - Medication Refill >> Nov 02, 2024 12:22 PM Sophia H wrote: Medication: valsartan  (DIOVAN ) 40 MG tablet **Requesting 90 day supply for insurance   Has the patient contacted their pharmacy? Yes, need updated RX. Out of fills.   This is the patient's preferred pharmacy:  [facility] - [place], [place] - [address] [place] [address] Phone: [phone] Fax: [phone]  Is this the correct pharmacy for this prescription? Yes If no, delete pharmacy and type the correct one.   Has the prescription been filled recently? Yes  Is the patient out of the medication? Yes  Has the patient been seen for an appointment in the last year OR does the patient have an upcoming appointment? Yes, appt 12/22.   Can we respond through MyChart? Yes  Agent: Please be advised that Rx refills may take up to 3 business days. We ask that you follow-up with your pharmacy.

## 2024-11-02 NOTE — Telephone Encounter (Signed)
 Quantity has been updated and refilled by pharmacy.

## 2024-11-02 NOTE — Telephone Encounter (Deleted)
 Duplicate rx request.

## 2024-11-13 ENCOUNTER — Ambulatory Visit: Payer: Self-pay

## 2024-11-13 NOTE — Telephone Encounter (Signed)
 FYI Only or Action Required?: FYI only for provider: UC/ER advised.  Patient was last seen in primary care on 07/30/2024 by Gayle Saddie FALCON, PA-C.  Called Nurse Triage reporting Joint Swelling.  Symptoms began several days ago.  Interventions attempted: Nothing.  Symptoms are: gradually worsening.  Triage Disposition: See HCP Within 4 Hours (Or PCP Triage)  Patient/caregiver understands and will follow disposition?: Yes     Copied from CRM [id number]. Topic: Clinical - Red Word Triage >> Nov 13, 2024 11:29 AM Rosaria BRAVO wrote: Red Word that prompted transfer to Nurse Triage: Swelling in hands and feet, painful, difficulty walking. Daughter Zebedee is calling, she is 10 minutes from him. Reason for Disposition  [1] SEVERE ankle pain (e.g., excruciating, unable to walk) AND [2] not improved after 2 hours of pain medicine  Answer Assessment - Initial Assessment Questions Pt's dtr called, 4 days ago he began to have ankle swelling, has been using a cane due to the pain. Yesterday swelling started to go to his feet. This morning he woke up and hands were swelling also. He stopped metoprolol  about 6- 8 weeks ago. She states he does have redness around ankles and hands are read. Pain is 9/10. Denies fever, denies chest pain, denies shortness of breath. Recs are for pt to be seen today. Rn advised given that there are no available appts, would recommend Er or UC. She stated understanding. Pt has a preset appt for Thursday next week. RN advised her to keep that but would still get him evaluated today since he is having pain, redness and swelling. She agreed and stated she would take him      1. LOCATION: Which ankle is swollen? Where is the swelling?     Bilateral ankles, bilateral hands 2. ONSET: When did the swelling start?     4 days ago 3. SWELLING: How bad is the swelling? Or, How large is it? (e.g., mild, moderate, severe; size of localized swelling)      moderate 4. PAIN: Is  there any pain? If Yes, ask: How bad is it? (Scale 0-10; or none, mild, moderate, severe)     9/10 5. CAUSE: What do you think caused the ankle swelling?     unknown 6. OTHER SYMPTOMS: Do you have any other symptoms? (e.g., fever, chest pain, difficulty breathing, calf pain)     Denies these but is having redness around ankles and hands  Protocols used: Ankle Swelling-A-AH

## 2024-11-19 ENCOUNTER — Ambulatory Visit (INDEPENDENT_AMBULATORY_CARE_PROVIDER_SITE_OTHER)

## 2024-11-19 DIAGNOSIS — Z0000 Encounter for general adult medical examination without abnormal findings: Secondary | ICD-10-CM

## 2024-11-19 NOTE — Patient Instructions (Signed)
 Mr. Schurman,  Thank you for taking the time for your Medicare Wellness Visit. I appreciate your continued commitment to your health goals. Please review the care plan we discussed, and feel free to reach out if I can assist you further.  Please note that Annual Wellness Visits do not include a physical exam. Some assessments may be limited, especially if the visit was conducted virtually. If needed, we may recommend an in-person follow-up with your provider.  Ongoing Care Seeing your primary care provider every 3 to 6 months helps us  monitor your health and provide consistent, personalized care.   Referrals If a referral was made during today's visit and you haven't received any updates within two weeks, please contact the referred provider directly to check on the status.  Recommended Screenings:  Health Maintenance  Topic Date Due   Eye exam for diabetics  03/06/2023   Medicare Annual Wellness Visit  04/17/2024   Flu Shot  07/10/2024   COVID-19 Vaccine (4 - 2025-26 season) 08/10/2024   Complete foot exam   10/14/2024   Hemoglobin A1C  01/31/2025   DTaP/Tdap/Td vaccine (3 - Td or Tdap) 02/16/2030   Pneumococcal Vaccine for age over 36  Completed   Zoster (Shingles) Vaccine  Completed   Meningitis B Vaccine  Aged Out       11/17/2024    1:02 PM  Advanced Directives  Does Patient Have a Medical Advance Directive? No  Would patient like information on creating a medical advance directive? No - Patient declined    Vision: Annual vision screenings are recommended for early detection of glaucoma, cataracts, and diabetic retinopathy. These exams can also reveal signs of chronic conditions such as diabetes and high blood pressure.  Dental: Annual dental screenings help detect early signs of oral cancer, gum disease, and other conditions linked to overall health, including heart disease and diabetes.  Please see the attached documents for additional preventive care recommendations.

## 2024-11-19 NOTE — Progress Notes (Signed)
 Chief Complaint  Patient presents with   Medicare Wellness     Subjective:   Joshua [name] is a 88 y.o. male who presents for a Medicare Annual Wellness Visit.  Visit info / Clinical Intake: Medicare Wellness Visit Type:: Subsequent Annual Wellness Visit Persons participating in visit and providing information:: patient Medicare Wellness Visit Mode:: Telephone If telephone:: video declined Since this visit was completed virtually, some vitals may be partially provided or unavailable. Missing vitals are due to the limitations of the virtual format.: Unable to obtain vitals - no equipment If Telephone or Video please confirm:: I connected with patient using audio/video enable telemedicine. I verified patient identity with two identifiers, discussed telehealth limitations, and patient agreed to proceed. Patient Location:: home Provider Location:: home office Interpreter Needed?: No Pre-visit prep was completed: yes AWV questionnaire completed by patient prior to visit?: yes Date:: 11/17/24 Living arrangements:: (!) (Patient-Rptd) lives alone Patient's Overall Health Status Rating: (!) (Patient-Rptd) fair Typical amount of pain: (Patient-Rptd) some Does pain affect daily life?: (!) (Patient-Rptd) yes Are you currently prescribed opioids?: no  Dietary Habits and Nutritional Risks How many meals a day?: (Patient-Rptd) 2 Eats fruit and vegetables daily?: (!) (Patient-Rptd) no Most meals are obtained by: (Patient-Rptd) having others provide food In the last 2 weeks, have you had any of the following?: none Diabetic:: (!) yes Any non-healing wounds?: no How often do you check your BS?: 0 Would you like to be referred to a Nutritionist or for Diabetic Management? : no  Functional Status Activities of Daily Living (to include ambulation/medication): (!) (Patient-Rptd) Needs Assist Ambulation: (Patient-Rptd) Independent Medication Administration: (Patient-Rptd) Independent Home  Management (perform basic housework or laundry): (Patient-Rptd) Needs assistance (comment) Manage your own finances?: (!) (Patient-Rptd) no Primary transportation is: (Patient-Rptd) family / friends Concerns about vision?: no *vision screening is required for WTM* Concerns about hearing?: (!) yes Uses hearing aids?: (!) yes  Fall Screening Falls in the past year?: 1 (tripped) Number of falls in past year: (Patient-Rptd) 1 Was there an injury with Fall?: (Patient-Rptd) 1 Fall Risk Category Calculator: (Patient-Rptd) 3 Patient Fall Risk Level: (Patient-Rptd) High Fall Risk  Fall Risk Patient at Risk for Falls Due to: History of fall(s); Medication side effect Fall risk Follow up: Falls prevention discussed; Falls evaluation completed  Home and Transportation Safety: All rugs have non-skid backing?: (Patient-Rptd) yes All stairs or steps have railings?: (Patient-Rptd) yes Grab bars in the bathtub or shower?: (Patient-Rptd) yes Have non-skid surface in bathtub or shower?: (Patient-Rptd) yes Good home lighting?: (!) (Patient-Rptd) no Regular seat belt use?: (Patient-Rptd) yes Hospital stays in the last year:: (Patient-Rptd) no  Cognitive Assessment Difficulty concentrating, remembering, or making decisions? : (Patient-Rptd) yes Will 6CIT or Mini Cog be Completed: yes What year is it?: 0 points What month is it?: 0 points Give patient an address phrase to remember (5 components): [address] About what time is it?: 0 points Count backwards from 20 to 1: 0 points Say the months of the year in reverse: 0 points Repeat the address phrase from earlier: 10 points 6 CIT Score: 10 points  Advance Directives (For Healthcare) Does Patient Have a Medical Advance Directive?: No Would patient like information on creating a medical advance directive?: No - Patient declined  Reviewed/Updated  Reviewed/Updated: Reviewed All (Medical, Surgical, Family, Medications, Allergies, Care  Teams, Patient Goals)    Allergies (verified) Patient has no known allergies.   Current Medications (verified) Outpatient Encounter Medications as of 11/19/2024  Medication Sig   ACCU-CHEK AVIVA PLUS test strip 1 each by Other route daily.   acetaminophen  (TYLENOL ) 500 MG tablet Take 2 tablets (1,000 mg total) by mouth every 6 (six) hours as needed.   albuterol  (PROAIR  HFA) 108 (90 Base) MCG/ACT inhaler Inhale 2 puffs into the lungs every 4 (four) hours as needed for wheezing or shortness of breath.   amLODipine  (NORVASC ) 10 MG tablet Take 1 tablet (10 mg total) by mouth daily.   budesonide-glycopyrrolate -formoterol (BREZTRI  AEROSPHERE) 160-9-4.8 MCG/ACT AERO inhaler Inhale 2 puffs into the lungs in the morning and at bedtime.   carboxymethylcellulose ([employer] LUBRICANT EYE DROPS) 0.5 % SOLN Place 1 drop into both eyes 3 (three) times daily as needed.   celecoxib  (CELEBREX ) 200 MG capsule Take 1 capsule (200 mg total) by mouth daily.   gabapentin  (NEURONTIN ) 100 MG capsule Take 1 capsule (100 mg total) by mouth 3 (three) times daily.   glipiZIDE  (GLUCOTROL  XL) 10 MG 24 hr tablet TAKE 1 TABLET BY MOUTH DAILY WITH BREAKFAST   metoprolol  succinate (TOPROL -XL) 25 MG 24 hr tablet Take 1 tablet (25 mg total) by mouth daily.   rivaroxaban  (XARELTO ) 20 MG TABS tablet Take 1 tablet (20 mg total) by mouth daily with supper.   rivaroxaban  (XARELTO ) 20 MG TABS tablet Take 1 tablet (20 mg total) by mouth daily with supper.   rosuvastatin  (CRESTOR ) 5 MG tablet TAKE 1 TABLET BY MOUTH DAILY   sodium chloride  (OCEAN) 0.65 % SOLN nasal spray Place 1 spray into both nostrils as needed for congestion.   valsartan  (DIOVAN ) 40 MG tablet TAKE 1 TABLET BY MOUTH DAILY   No facility-administered encounter medications on file as of 11/19/2024.    History: Past Medical History:  Diagnosis Date   Anemia    hx.   Arthritis    COPD (chronic obstructive pulmonary disease) (HCC)    reports at one poin he had copd but  he has stopped smoking and now doesnt have any trouble breathing or  need an inhaler    Diabetes mellitus without complication (HCC)    type 2   Headache    none recently    High cholesterol    History of kidney stones    HOH (hard of hearing)    both ears   Hypertension    Pneumonia    years ago   Past Surgical History:  Procedure Laterality Date   ANKLE FRACTURE SURGERY Left    when 23-34- years old   ANTERIOR CERVICAL DECOMP/DISCECTOMY FUSION N/A 07/11/2017   Procedure: Anterior Cervical Decompression/Discectomy Fusion  - Cervical three-four;  Surgeon: Onetha Kuba, MD;  Location: [facility] OR;  Service: Neurosurgery;  Laterality: N/A; limited extension of neck currently    CATARACT EXTRACTION Bilateral    CHOLECYSTECTOMY N/A 09/24/2021   Procedure: LAPAROSCOPIC CHOLECYSTECTOMY;  Surgeon: Dasie Leonor CROME, MD;  Location: [facility] OR;  Service: General;  Laterality: N/A;   EXTRACORPOREAL SHOCK WAVE LITHOTRIPSY Right 07/14/2020   Procedure: EXTRACORPOREAL SHOCK WAVE LITHOTRIPSY (ESWL);  Surgeon: Watt Rush, MD;  Location: [facility];  Service: Urology;  Laterality: Right;   FRACTURE SURGERY      fx. lower left leg at age 28-13   TEE WITHOUT CARDIOVERSION N/A 09/28/2021   Procedure: TRANSESOPHAGEAL ECHOCARDIOGRAM (TEE);  Surgeon: Michele Richardson, DO;  Location: [facility] ENDOSCOPY;  Service: Cardiovascular;  Laterality: N/A;   TOTAL KNEE ARTHROPLASTY Right 11/11/2017   Procedure: RIGHT TOTAL KNEE ARTHROPLASTY;  Surgeon: Melodi Lerner, MD;  Location: [facility] ORS;  Service:  Orthopedics;  Laterality: Right;   TOTAL KNEE ARTHROPLASTY Left 06/01/2019   Procedure: LEFT TOTAL KNEE ARTHROPLASTY;  Surgeon: Melodi Lerner, MD;  Location: [facility] ORS;  Service: Orthopedics;  Laterality: Left;    Family History  Problem Relation Age of Onset   Diabetes Father    Cancer Father    Alcohol abuse Father    Heart attack Father    Strabismus Daughter    Social History   Occupational History   Not on file   Tobacco Use   Smoking status: Former    Current packs/day: 0.00    Average packs/day: 2.0 packs/day for 60.0 years (120.0 ttl pk-yrs)    Types: Cigarettes    Start date: 07/[birth year]/1951    Quit date: 07/11/2011    Years since quitting: 13.3    Passive exposure: Past   Smokeless tobacco: Never  Vaping Use   Vaping status: Never Used  Substance and Sexual Activity   Alcohol use: Never   Drug use: Never   Sexual activity: Not Currently   Tobacco Counseling Counseling given: Not Answered  SDOH Screenings   Food Insecurity: No Food Insecurity (11/17/2024)  Housing: Low Risk (11/17/2024)  Transportation Needs: No Transportation Needs (11/17/2024)  Utilities: Not At Risk (11/19/2024)  Alcohol Screen: Low Risk (11/17/2024)  Depression (PHQ2-9): Low Risk (11/19/2024)  Financial Resource Strain: Low Risk (11/17/2024)  Physical Activity: Inactive (11/17/2024)  Social Connections: Socially Isolated (11/17/2024)  Stress: No Stress Concern Present (11/17/2024)  Tobacco Use: Medium Risk (11/19/2024)  Health Literacy: Adequate Health Literacy (11/19/2024)   See flowsheets for full screening details  Depression Screen PHQ 2 & 9 Depression Scale- Over the past 2 weeks, how often have you been bothered by any of the following problems? Little interest or pleasure in doing things: 0 Feeling down, depressed, or hopeless (PHQ Adolescent also includes...irritable): 0 PHQ-2 Total Score: 0 Trouble falling or staying asleep, or sleeping too much: 1 Feeling tired or having little energy: 0 Poor appetite or overeating (PHQ Adolescent also includes...weight loss): 0 Feeling bad about yourself - or that you are a failure or have let yourself or your family down: 0 Trouble concentrating on things, such as reading the newspaper or watching television (PHQ Adolescent also includes...like school work): 0 Moving or speaking so slowly that other people could have noticed. Or the opposite - being so fidgety or restless  that you have been moving around a lot more than usual: 0 Thoughts that you would be better off dead, or of hurting yourself in some way: 0 PHQ-9 Total Score: 1 If you checked off any problems, how difficult have these problems made it for you to do your work, take care of things at home, or get along with other people?: Not difficult at all  Depression Treatment Depression Interventions/Treatment : EYV7-0 Score <4 Follow-up Not Indicated     Goals Addressed             This Visit's Progress    Patient Stated       11/19/2024, denies goals             Objective:    Today's Vitals   There is no height or weight on file to calculate BMI.  Hearing/Vision screen Hearing Screening - Comments:: Has hearing aids that are maintained Vision Screening - Comments:: No regular eye exams Immunizations and Health Maintenance Health Maintenance  Topic Date Due   OPHTHALMOLOGY EXAM  03/06/2023   Influenza Vaccine  07/10/2024   COVID-19 Vaccine (  4 - 2025-26 season) 08/10/2024   FOOT EXAM  10/14/2024   HEMOGLOBIN A1C  01/31/2025   Medicare Annual Wellness (AWV)  11/19/2025   DTaP/Tdap/Td (3 - Td or Tdap) 02/16/2030   Pneumococcal Vaccine: 50+ Years  Completed   Zoster Vaccines- Shingrix  Completed   Meningococcal B Vaccine  Aged Out        Assessment/Plan:  This is a routine wellness examination for Apostolos.  Patient Care Team: Gayle Saddie JULIANNA DEVONNA as PCP - General (Physician Assistant) Kate Lonni CROME, MD as PCP - Cardiology (Cardiology)  I have personally reviewed and noted the following in the patients chart:   Medical and social history Use of alcohol, tobacco or illicit drugs  Current medications and supplements including opioid prescriptions. Functional ability and status Nutritional status Physical activity Advanced directives List of other physicians Hospitalizations, surgeries, and ER visits in previous 12 months Vitals Screenings to include  cognitive, depression, and falls Referrals and appointments  No orders of the defined types were placed in this encounter.  In addition, I have reviewed and discussed with patient certain preventive protocols, quality metrics, and best practice recommendations. A written personalized care plan for preventive services as well as general preventive health recommendations were provided to patient.   Ardella FORBES Dawn, LPN   87/88/7974   Return in 1 year (on 11/19/2025).  After Visit Summary: (MyChart) Due to this being a telephonic visit, the after visit summary with patients personalized plan was offered to patient via MyChart   Nurse Notes: Patient advised to keep follow-up appointment with PCP (11/30/2024) Vaccines not given: covid declined today Will obtain flu at next visit HM Addressed: States does no go to the eye doctor any more.

## 2024-11-30 ENCOUNTER — Ambulatory Visit

## 2024-11-30 VITALS — BP 146/72 | HR 134 | Temp 97.4°F | Ht 72.0 in | Wt 200.0 lb

## 2024-11-30 DIAGNOSIS — I10 Essential (primary) hypertension: Secondary | ICD-10-CM | POA: Diagnosis not present

## 2024-11-30 DIAGNOSIS — E1159 Type 2 diabetes mellitus with other circulatory complications: Secondary | ICD-10-CM

## 2024-11-30 DIAGNOSIS — I152 Hypertension secondary to endocrine disorders: Secondary | ICD-10-CM | POA: Diagnosis not present

## 2024-11-30 DIAGNOSIS — Z7984 Long term (current) use of oral hypoglycemic drugs: Secondary | ICD-10-CM | POA: Diagnosis not present

## 2024-11-30 DIAGNOSIS — H1013 Acute atopic conjunctivitis, bilateral: Secondary | ICD-10-CM | POA: Diagnosis not present

## 2024-11-30 DIAGNOSIS — J449 Chronic obstructive pulmonary disease, unspecified: Secondary | ICD-10-CM | POA: Diagnosis not present

## 2024-11-30 DIAGNOSIS — E785 Hyperlipidemia, unspecified: Secondary | ICD-10-CM | POA: Diagnosis not present

## 2024-11-30 DIAGNOSIS — E1169 Type 2 diabetes mellitus with other specified complication: Secondary | ICD-10-CM

## 2024-11-30 DIAGNOSIS — Z23 Encounter for immunization: Secondary | ICD-10-CM

## 2024-11-30 DIAGNOSIS — J309 Allergic rhinitis, unspecified: Secondary | ICD-10-CM

## 2024-11-30 DIAGNOSIS — E1165 Type 2 diabetes mellitus with hyperglycemia: Secondary | ICD-10-CM | POA: Diagnosis not present

## 2024-11-30 LAB — POCT GLYCOSYLATED HEMOGLOBIN (HGB A1C): Hemoglobin A1C: 7.2 % — AB (ref 4.0–5.6)

## 2024-11-30 MED ORDER — VALSARTAN 40 MG PO TABS: 40.0000 mg | ORAL_TABLET | Freq: Every day | ORAL | 2 refills | Status: AC

## 2024-11-30 MED ORDER — EZETIMIBE 10 MG PO TABS: 10.0000 mg | ORAL_TABLET | Freq: Every day | ORAL | 3 refills | Status: AC

## 2024-11-30 MED ORDER — CETIRIZINE HCL 10 MG PO TABS: 10.0000 mg | ORAL_TABLET | Freq: Every day | ORAL | 2 refills | Status: AC

## 2024-11-30 MED ORDER — FLUTICASONE PROPIONATE 50 MCG/ACT NA SUSP: 2.0000 | Freq: Every day | NASAL | 6 refills | Status: AC

## 2024-11-30 NOTE — Assessment & Plan Note (Signed)
 BP Goal < 130/80. Above goal in office today and on recheck. Advised to start monitoring BP at [facility]

## 2024-11-30 NOTE — Progress Notes (Unsigned)
 "  Established Patient Office Visit  Subjective   Patient ID: Joshua [name], male    DOB: September 15, 1936  Age: 88 y.o. MRN: [id number]  Chief Complaint  Patient presents with   Medical Management of Chronic Issues    HPI  History of Present Illness   Joshua [name] is an 88 year old male who presents for a regular checkup and medication refills. He is accompanied by his daughter.   Ocular symptoms - Significant eye watering and redness attributed to frequent eye rubbing - Persistent symptoms despite use of lubricating eye drops  - Reports intermittent redness in the whites of his eyes  - No use of allergy medications such as Claritin  or Zyrtec , although available at home  Nasal obstruction - Difficulty breathing through nose, uses Afrin nasal spray nightly - No use of other nasal sprays   Gout - Recent flare-up approximately two weeks ago - Managed by increasing medication dose of gabapentin   - Pain has subsided and he has since stopped taking his gabapentin    Musculoskeletal pain - Muscle pain and cramps associated with rosuvastatin  use for hyperlipidemia, leading to discontinuation of medication several weeks ago  - Also reports that he has been having shooting pains in his feet and hands that he attributes to the Gabapentin . Pain improvement after discontinuing gabapentin  about one week ago  Cerebrovascular disease - History of stroke - Patient on DOAC but has stopped taking his Xarelto  because he believes it was causing side effects.  - Patient's daughter also reports that patient refuses to go back to cardiology for follow up or to discuss alternative blood thinners   Hypertension - Not checking blood pressure at home - Takes antihypertensive medications around noon - Uncertain of current blood pressure readings but is willing to start checking it at home         ROS Per HPI.    Objective:     BP (!) 146/72   Pulse (!) 134   Temp (!) 97.4 F (36.3 C)  (Oral)   Ht 6' (1.829 m)   Wt 200 lb 0.6 oz (90.7 kg)   SpO2 97%   BMI 27.13 kg/m    Physical Exam Constitutional:      General: He is not in acute distress.    Appearance: Normal appearance.  Eyes:     General: Allergic shiner present.     Comments: Ectropion noted to left lower lid   Cardiovascular:     Rate and Rhythm: Normal rate and regular rhythm.     Heart sounds: Normal heart sounds. No murmur heard.    No friction rub. No gallop.  Pulmonary:     Effort: Pulmonary effort is normal. No respiratory distress.     Breath sounds: Normal breath sounds.  Musculoskeletal:        General: No swelling.  Skin:    General: Skin is warm and dry.  Neurological:     General: No focal deficit present.     Mental Status: He is alert.  Psychiatric:        Mood and Affect: Mood normal.        Behavior: Behavior normal.        Thought Content: Thought content normal.      Results for orders placed or performed in visit on 11/30/24  POCT glycosylated hemoglobin (Hb A1C)  Result Value Ref Range   Hemoglobin A1C 7.2 (A) 4.0 - 5.6 %   HbA1c POC (<> result, manual entry)  HbA1c, POC (prediabetic range)     HbA1c, POC (controlled diabetic range)        The ASCVD Risk score (Arnett DK, et al., 2019) failed to calculate for the following reasons:   The 2019 ASCVD risk score is only valid for ages 8 to 75   * - Cholesterol units were assumed    Assessment & Plan:   Hypertension associated with diabetes (HCC) Assessment & Plan: BP Goal < 130/80. Above goal in office today and on recheck. Advised to start monitoring BP at home. Possible medication adjustment based on readings. - Refilled valsartan  40 mg and amlodipine  10 mg for now.  - Instructed on home blood pressure monitoring. Advised to drop the log off to the office in the next 2-3 weeks.  - Provided blood pressure log for tracking. - Advised to report if blood pressure consistently exceeds 130/80 mmHg. - If BP  remains above goal, will double valsartan  to 80 mg.  Orders: -     POCT glycosylated hemoglobin (Hb A1C) -     TSH; Future -     Lipid panel; Future -     Comprehensive metabolic panel with GFR; Future -     CBC with Differential/Platelet; Future  Essential hypertension -     Valsartan ; Take 1 tablet (40 mg total) by mouth daily.  Dispense: 90 tablet; Refill: 2  Encounter for vaccination  Type 2 diabetes mellitus with hyperglycemia, without long-term current use of insulin  (HCC) Assessment & Plan: A1c goal <8 due to age. A1c further improved to 7.2. today. Will continue with glipizide  10 mg daily for now. If needed, Jardiance  would be the next best addition. Foot exam UTD. Patient continues to decline eye exam. UACR up to date. Will cont to monitor.   Orders: -     TSH; Future -     Lipid panel; Future -     Comprehensive metabolic panel with GFR; Future -     CBC with Differential/Platelet; Future  Hyperlipidemia associated with type 2 diabetes mellitus (HCC) Assessment & Plan: Rechecking lipid panel with labs today. He stopped his rosuvastatin  several months ago due to leg cramps. Risks and benefits of this decision discussed. Pt verbalized understanding. Pt open to a trial of Zetia  10 mg for cholesterol. Will follow up in 3 months to assess tolerance and efficacy.   Orders: -     TSH; Future -     Lipid panel; Future -     Comprehensive metabolic panel with GFR; Future -     CBC with Differential/Platelet; Future  Chronic obstructive pulmonary disease, unspecified COPD type (HCC) Assessment & Plan: Patient used Breztri  for 2-3 days and stopped the medication. Declines further treatment at this time.   Allergic conjunctivitis of both eyes and rhinitis Assessment & Plan: Chronic eye watering and redness likely due to seasonal allergies. Possible dry eye contributing. Discussed Afrin's rebound congestion risk. Zyrtec  may reduce symptoms. - Prescribed Zyrtec  for allergy  symptoms. - Continue eye drops for hydration. - Prescribed Flonase  nasal spray to replace Afrin. - Advised to monitor symptoms and consider ENT referral if no improvement.   Other orders -     Flu vaccine HIGH DOSE PF(Fluzone Trivalent) -     Fluticasone  Propionate; Place 2 sprays into both nostrils daily.  Dispense: 16 g; Refill: 6 -     Cetirizine  HCl; Take 1 tablet (10 mg total) by mouth daily.  Dispense: 30 tablet; Refill: 2 -     Ezetimibe ;  Take 1 tablet (10 mg total) by mouth daily.  Dispense: 90 tablet; Refill: 3   Return in about 3 months (around 02/28/2025) for HTN.    Saddie JULIANNA Sacks, PA-C "

## 2024-11-30 NOTE — Patient Instructions (Signed)
 VISIT SUMMARY: Today, you had a regular checkup where we discussed your eye and nasal symptoms, blood pressure, diabetes, cholesterol, and gout. We also reviewed your medications and made some adjustments to better manage your conditions.  YOUR PLAN: CHRONIC ALLERGIC CONJUNCTIVITIS AND RHINITIS: You have ongoing eye watering and redness likely due to allergies, and difficulty breathing through your nose. -Start taking Zyrtec  for your allergy symptoms. -Continue using eye drops to keep your eyes hydrated. -Switch from Afrin to Flonase  nasal spray to avoid rebound congestion. -Monitor your symptoms and consider seeing an ENT specialist if there's no improvement.  ESSENTIAL HYPERTENSION: Your blood pressure is being managed with valsartan , but you need to monitor it at home. -Refilled your valsartan  prescription for 90 days. -Start monitoring your blood pressure at home and keep a log. -Aim for a target blood pressure of 130/80 mmHg or less. -Report if your blood pressure consistently exceeds 130/80 mmHg.  TYPE 2 DIABETES MELLITUS: Your A1c level is stable at 7.2, indicating your diabetes management is adequate. -Continue with your current diabetes management plan.  HYPERLIPIDEMIA: You had muscle cramps with rosuvastatin , so we are switching to Zetia . -Start taking Zetia  (ezetimibe ) instead of rosuvastatin . -Get fasting blood work done to monitor your cholesterol levels.  GOUT: You recently had a gout flare-up that has resolved. -Contact us  for a colchicine prescription if you have another gout flare-up.  GENERAL HEALTH MAINTENANCE: We discussed your vaccinations. -You received a flu vaccine today. -We discussed COVID-19 vaccination options, but you declined the COVID-19 vaccine.  If you have any problems before your next visit feel free to message me via MyChart (minor issues or questions) or call the office, otherwise you may reach out to schedule an office visit.  Thank you! Saddie Sacks, PA-C

## 2024-12-01 DIAGNOSIS — H1010 Acute atopic conjunctivitis, unspecified eye: Secondary | ICD-10-CM | POA: Insufficient documentation

## 2024-12-01 NOTE — Assessment & Plan Note (Signed)
 Chronic eye watering and redness likely due to seasonal allergies. Possible dry eye contributing. Discussed Afrin's rebound congestion risk. Zyrtec  may reduce symptoms. - Prescribed Zyrtec  for allergy symptoms. - Continue eye drops for hydration. - Prescribed Flonase  nasal spray to replace Afrin. - Advised to monitor symptoms and consider ENT referral if no improvement.

## 2024-12-01 NOTE — Assessment & Plan Note (Signed)
 Rechecking lipid panel with labs today. He stopped his rosuvastatin  several months ago due to leg cramps. Risks and benefits of this decision discussed. Pt verbalized understanding. Pt open to a trial of Zetia  10 mg for cholesterol. Will follow up in 3 months to assess tolerance and efficacy.

## 2024-12-01 NOTE — Assessment & Plan Note (Signed)
 Patient used Breztri  for 2-3 days and stopped the medication. Declines further treatment at this time.

## 2024-12-01 NOTE — Assessment & Plan Note (Signed)
 A1c goal <8 due to age. A1c further improved to 7.2. today. Will continue with glipizide  10 mg daily for now. If needed, Jardiance  would be the next best addition. Foot exam UTD. Patient continues to decline eye exam. UACR up to date. Will cont to monitor.

## 2024-12-07 ENCOUNTER — Other Ambulatory Visit

## 2024-12-07 DIAGNOSIS — I152 Hypertension secondary to endocrine disorders: Secondary | ICD-10-CM

## 2024-12-07 DIAGNOSIS — E1165 Type 2 diabetes mellitus with hyperglycemia: Secondary | ICD-10-CM

## 2024-12-07 DIAGNOSIS — E1169 Type 2 diabetes mellitus with other specified complication: Secondary | ICD-10-CM

## 2024-12-08 LAB — CBC WITH DIFFERENTIAL/PLATELET
Basophils Absolute: 0 x10E3/uL (ref 0.0–0.2)
Basos: 0 %
EOS (ABSOLUTE): 0.6 x10E3/uL — ABNORMAL HIGH (ref 0.0–0.4)
Eos: 7 %
Hematocrit: 42.7 % (ref 37.5–51.0)
Hemoglobin: 13.9 g/dL (ref 13.0–17.7)
Immature Grans (Abs): 0 x10E3/uL (ref 0.0–0.1)
Immature Granulocytes: 0 %
Lymphocytes Absolute: 1.5 x10E3/uL (ref 0.7–3.1)
Lymphs: 16 %
MCH: 30.7 pg (ref 26.6–33.0)
MCHC: 32.6 g/dL (ref 31.5–35.7)
MCV: 94 fL (ref 79–97)
Monocytes Absolute: 0.7 x10E3/uL (ref 0.1–0.9)
Monocytes: 7 %
Neutrophils Absolute: 6.3 x10E3/uL (ref 1.4–7.0)
Neutrophils: 70 %
RBC: 4.53 x10E6/uL (ref 4.14–5.80)
RDW: 13 % (ref 11.6–15.4)
WBC: 9.2 x10E3/uL (ref 3.4–10.8)

## 2024-12-08 LAB — COMPREHENSIVE METABOLIC PANEL WITH GFR
ALT: 11 IU/L (ref 0–44)
AST: 11 IU/L (ref 0–40)
Albumin: 4 g/dL (ref 3.7–4.7)
Alkaline Phosphatase: 90 IU/L (ref 48–129)
BUN/Creatinine Ratio: 15 (ref 10–24)
BUN: 16 mg/dL (ref 8–27)
Bilirubin Total: 0.5 mg/dL (ref 0.0–1.2)
CO2: 21 mmol/L (ref 20–29)
Calcium: 9.1 mg/dL (ref 8.6–10.2)
Chloride: 108 mmol/L — ABNORMAL HIGH (ref 96–106)
Creatinine, Ser: 1.06 mg/dL (ref 0.76–1.27)
Globulin, Total: 2.2 g/dL (ref 1.5–4.5)
Glucose: 141 mg/dL — ABNORMAL HIGH (ref 70–99)
Potassium: 4.5 mmol/L (ref 3.5–5.2)
Sodium: 144 mmol/L (ref 134–144)
Total Protein: 6.2 g/dL (ref 6.0–8.5)
eGFR: 68 mL/min/1.73

## 2024-12-08 LAB — LIPID PANEL
Chol/HDL Ratio: 3.1 ratio (ref 0.0–5.0)
Cholesterol, Total: 165 mg/dL (ref 100–199)
HDL: 53 mg/dL
LDL Chol Calc (NIH): 100 mg/dL — ABNORMAL HIGH (ref 0–99)
Triglycerides: 63 mg/dL (ref 0–149)
VLDL Cholesterol Cal: 12 mg/dL (ref 5–40)

## 2024-12-08 LAB — TSH: TSH: 1.97 u[IU]/mL (ref 0.450–4.500)

## 2024-12-11 ENCOUNTER — Ambulatory Visit: Payer: Self-pay

## 2024-12-22 ENCOUNTER — Ambulatory Visit: Payer: Self-pay

## 2024-12-22 ENCOUNTER — Telehealth: Payer: Self-pay | Admitting: *Deleted

## 2024-12-22 ENCOUNTER — Other Ambulatory Visit: Payer: Self-pay

## 2024-12-22 MED ORDER — GABAPENTIN 100 MG PO CAPS: 200.0000 mg | ORAL_CAPSULE | Freq: Three times a day (TID) | ORAL | 3 refills | Status: AC

## 2024-12-22 NOTE — Telephone Encounter (Signed)
 I would highly recommend patient come in for an office visit for this. I am happy to see him this week or next week.   At his last OV, he told me that he stopped taking Gabapentin , but based on what daughter is saying, it sounds like he may be taking it. Can we confirm the dose so I know how much to send in for him?

## 2024-12-22 NOTE — Telephone Encounter (Signed)
 Copied from CRM [id number]. Topic: General - Other >> Dec 22, 2024  2:27 PM Sophia H wrote: Reason for CRM: RuthAnn calling on behalf of the patient. States she had a missed call, unsure if from clinic or not regarding red word triage earlier. No answer at CAL/notes in chart, please return call if you reached out. # [phone]

## 2024-12-22 NOTE — Telephone Encounter (Signed)
 Provider is aware of this message and has sent in some medication. Pt daughter aware and appt scheduled.

## 2024-12-22 NOTE — Telephone Encounter (Signed)
 FYI Only or Action Required?: Action required by provider: update on patient condition and medication request.  Patient was last seen in primary care on 11/30/2024 by Gayle Saddie FALCON, PA-C.  Called Nurse Triage reporting Hip Pain.  Symptoms began several days ago.  Interventions attempted: Nothing.  Symptoms are: unchanged.  Triage Disposition: See HCP Within 4 Hours (Or PCP Triage)  Patient/caregiver understands and will follow disposition?: No, refuses disposition     Copied from CRM [id number]. Topic: Clinical - Red Word Triage >> Dec 22, 2024  1:41 PM Alexandria E wrote: [facility] that prompted transfer to Nurse Triage: Sciatic nerve pain, right hip down leg. Patient cannot get around very well at all. Reason for Disposition  [1] SEVERE pain (e.g., excruciating, unable to do any normal activities) AND [2] not improved after 2 hours of pain medicine  Answer Assessment - Initial Assessment Questions Patient declined appointment. Requests for medication to be called into  [facility] Please contact daughter, Levorn Caldron, at [phone] for any questions or if medication is called in  1. LOCATION and RADIATION: Where is the pain located? Does the pain spread (shoot) anywhere else?     Right hip pain, radiates down his leg  3. SEVERITY: How bad is the pain? What does it keep you from doing?   (Scale 1-10; or mild, moderate, severe)     severe 4. ONSET: When did the pain start? Does it come and go, or is it there all the time?     Saturday, three days ago 5. WORK OR EXERCISE: Has there been any recent work or exercise that involved this part of the body?      No specific cause 6. CAUSE: What do you think is causing the hip pain?      Sciatic pain 7. AGGRAVATING FACTORS: What makes the hip pain worse? (e.g., walking, climbing stairs, running)     Walking/activity  Protocols used: Hip Pain-A-AH

## 2024-12-22 NOTE — Progress Notes (Signed)
 Increasing patient's Gabapentin  from 100 mg TID to 200 mg TID due to sciatic nerve pain flare. Will schedule a follow up this week or next week to reassess efficacy and discuss alternative treatments if needed.

## 2024-12-22 NOTE — Telephone Encounter (Signed)
 Pt daughter stated he is taking 100 mg 3 times a day.  Agreeable to schedule an appt. J SHe also said that then he was not having to many problems and since then it has happened twice since December

## 2024-12-31 ENCOUNTER — Ambulatory Visit

## 2025-03-01 ENCOUNTER — Ambulatory Visit
